# Patient Record
Sex: Male | Born: 1935 | Race: White | Hispanic: No | Marital: Married | Smoking: Former smoker
Health system: Southern US, Community
[De-identification: ages and names within clinical notes are randomized; demographics above are authoritative.]

## PROBLEM LIST (undated history)

## (undated) DIAGNOSIS — IMO0001 Reserved for inherently not codable concepts without codable children: Secondary | ICD-10-CM

## (undated) DIAGNOSIS — K222 Esophageal obstruction: Secondary | ICD-10-CM

## (undated) DIAGNOSIS — I714 Abdominal aortic aneurysm, without rupture, unspecified: Secondary | ICD-10-CM

## (undated) DIAGNOSIS — M5134 Other intervertebral disc degeneration, thoracic region: Secondary | ICD-10-CM

## (undated) DIAGNOSIS — R011 Cardiac murmur, unspecified: Secondary | ICD-10-CM

## (undated) DIAGNOSIS — Z950 Presence of cardiac pacemaker: Secondary | ICD-10-CM

## (undated) DIAGNOSIS — N400 Enlarged prostate without lower urinary tract symptoms: Secondary | ICD-10-CM

## (undated) DIAGNOSIS — I2510 Atherosclerotic heart disease of native coronary artery without angina pectoris: Secondary | ICD-10-CM

## (undated) DIAGNOSIS — E785 Hyperlipidemia, unspecified: Secondary | ICD-10-CM

## (undated) DIAGNOSIS — C801 Malignant (primary) neoplasm, unspecified: Secondary | ICD-10-CM

## (undated) DIAGNOSIS — I499 Cardiac arrhythmia, unspecified: Secondary | ICD-10-CM

## (undated) DIAGNOSIS — K219 Gastro-esophageal reflux disease without esophagitis: Secondary | ICD-10-CM

## (undated) DIAGNOSIS — I509 Heart failure, unspecified: Secondary | ICD-10-CM

## (undated) DIAGNOSIS — E119 Type 2 diabetes mellitus without complications: Secondary | ICD-10-CM

## (undated) DIAGNOSIS — I10 Essential (primary) hypertension: Secondary | ICD-10-CM

## (undated) DIAGNOSIS — I219 Acute myocardial infarction, unspecified: Secondary | ICD-10-CM

## (undated) DIAGNOSIS — E039 Hypothyroidism, unspecified: Secondary | ICD-10-CM

## (undated) DIAGNOSIS — M1990 Unspecified osteoarthritis, unspecified site: Secondary | ICD-10-CM

## (undated) HISTORY — PX: APPENDECTOMY: SHX54

## (undated) HISTORY — DX: Other intervertebral disc degeneration, thoracic region: M51.34

## (undated) HISTORY — DX: Heart failure, unspecified: I50.9

## (undated) HISTORY — PX: GREAT TOE ARTHRODESIS, INTERPHALANGEAL JOINT: SUR55

## (undated) HISTORY — DX: Abdominal aortic aneurysm, without rupture, unspecified: I71.40

## (undated) HISTORY — DX: Hyperlipidemia, unspecified: E78.5

## (undated) HISTORY — PX: HERNIA REPAIR: SHX51

## (undated) HISTORY — DX: Benign prostatic hyperplasia without lower urinary tract symptoms: N40.0

## (undated) HISTORY — PX: CARDIAC VALVE REPLACEMENT: SHX585

## (undated) HISTORY — DX: Abdominal aortic aneurysm, without rupture: I71.4

## (undated) HISTORY — PX: CARDIAC CATHETERIZATION: SHX172

## (undated) HISTORY — DX: Cardiac arrhythmia, unspecified: I49.9

## (undated) HISTORY — PX: SKIN BIOPSY: SHX1

## (undated) HISTORY — PX: EYE SURGERY: SHX253

## (undated) HISTORY — DX: Esophageal obstruction: K22.2

---

## 1993-02-13 HISTORY — PX: AORTIC VALVE REPLACEMENT: SHX41

## 1993-02-13 HISTORY — PX: CORONARY ARTERY BYPASS GRAFT: SHX141

## 2004-06-03 ENCOUNTER — Ambulatory Visit: Payer: Self-pay | Admitting: Internal Medicine

## 2004-12-06 ENCOUNTER — Ambulatory Visit: Payer: Self-pay | Admitting: Internal Medicine

## 2005-07-10 ENCOUNTER — Ambulatory Visit: Payer: Self-pay | Admitting: Internal Medicine

## 2005-12-19 ENCOUNTER — Ambulatory Visit: Payer: Self-pay | Admitting: Internal Medicine

## 2006-07-09 ENCOUNTER — Ambulatory Visit: Payer: Self-pay | Admitting: Internal Medicine

## 2007-03-06 ENCOUNTER — Ambulatory Visit: Payer: Self-pay | Admitting: Gastroenterology

## 2007-04-04 ENCOUNTER — Ambulatory Visit: Payer: Self-pay | Admitting: Gastroenterology

## 2007-07-24 ENCOUNTER — Ambulatory Visit: Payer: Self-pay | Admitting: Internal Medicine

## 2008-04-02 ENCOUNTER — Ambulatory Visit: Payer: Self-pay | Admitting: Internal Medicine

## 2008-07-13 ENCOUNTER — Ambulatory Visit: Payer: Self-pay | Admitting: Internal Medicine

## 2008-07-31 ENCOUNTER — Ambulatory Visit: Payer: Self-pay | Admitting: Internal Medicine

## 2009-03-04 ENCOUNTER — Ambulatory Visit: Payer: Self-pay | Admitting: Cardiology

## 2010-08-02 ENCOUNTER — Ambulatory Visit: Payer: Self-pay | Admitting: Gastroenterology

## 2010-08-23 ENCOUNTER — Ambulatory Visit: Payer: Self-pay | Admitting: Internal Medicine

## 2010-10-11 ENCOUNTER — Ambulatory Visit: Payer: Self-pay

## 2011-08-23 ENCOUNTER — Ambulatory Visit: Payer: Self-pay | Admitting: Internal Medicine

## 2012-02-14 HISTORY — PX: ESOPHAGOGASTRODUODENOSCOPY: SHX1529

## 2012-09-10 ENCOUNTER — Ambulatory Visit: Payer: Self-pay | Admitting: Internal Medicine

## 2012-11-07 ENCOUNTER — Ambulatory Visit: Payer: Self-pay | Admitting: Gastroenterology

## 2012-11-08 LAB — PATHOLOGY REPORT

## 2013-02-13 HISTORY — PX: COLONOSCOPY: SHX174

## 2013-09-25 ENCOUNTER — Ambulatory Visit: Payer: Self-pay | Admitting: Internal Medicine

## 2013-09-30 DIAGNOSIS — I219 Acute myocardial infarction, unspecified: Secondary | ICD-10-CM | POA: Insufficient documentation

## 2013-11-26 ENCOUNTER — Ambulatory Visit: Payer: Self-pay | Admitting: Gastroenterology

## 2013-11-26 LAB — CLOSTRIDIUM DIFFICILE(ARMC)

## 2013-11-28 LAB — STOOL CULTURE

## 2013-12-04 ENCOUNTER — Ambulatory Visit: Payer: Self-pay | Admitting: Cardiology

## 2013-12-04 LAB — BASIC METABOLIC PANEL
ANION GAP: 10 (ref 7–16)
BUN: 23 mg/dL — ABNORMAL HIGH (ref 7–18)
Calcium, Total: 8.4 mg/dL — ABNORMAL LOW (ref 8.5–10.1)
Chloride: 106 mmol/L (ref 98–107)
Co2: 26 mmol/L (ref 21–32)
Creatinine: 1.22 mg/dL (ref 0.60–1.30)
EGFR (African American): >60
GLUCOSE: 91 mg/dL (ref 65–99)
Osmolality: 286 (ref 275–301)
Potassium: 4 mmol/L (ref 3.5–5.1)
Sodium: 142 mmol/L (ref 136–145)

## 2013-12-04 LAB — PROTIME-INR
INR: 1.1
PROTHROMBIN TIME: 14 s (ref 11.5–14.7)

## 2013-12-17 LAB — CULTURE, FUNGUS WITHOUT SMEAR

## 2013-12-18 ENCOUNTER — Ambulatory Visit: Payer: Self-pay | Admitting: Gastroenterology

## 2014-02-24 DIAGNOSIS — I350 Nonrheumatic aortic (valve) stenosis: Secondary | ICD-10-CM | POA: Diagnosis not present

## 2014-02-24 DIAGNOSIS — Z951 Presence of aortocoronary bypass graft: Secondary | ICD-10-CM | POA: Diagnosis not present

## 2014-03-03 DIAGNOSIS — I4891 Unspecified atrial fibrillation: Secondary | ICD-10-CM | POA: Diagnosis not present

## 2014-03-25 DIAGNOSIS — I4891 Unspecified atrial fibrillation: Secondary | ICD-10-CM | POA: Diagnosis not present

## 2014-03-25 DIAGNOSIS — R0602 Shortness of breath: Secondary | ICD-10-CM | POA: Diagnosis not present

## 2014-03-25 DIAGNOSIS — E119 Type 2 diabetes mellitus without complications: Secondary | ICD-10-CM | POA: Diagnosis not present

## 2014-03-25 DIAGNOSIS — I2510 Atherosclerotic heart disease of native coronary artery without angina pectoris: Secondary | ICD-10-CM | POA: Diagnosis not present

## 2014-03-25 DIAGNOSIS — Z87891 Personal history of nicotine dependence: Secondary | ICD-10-CM | POA: Diagnosis not present

## 2014-03-25 DIAGNOSIS — Z951 Presence of aortocoronary bypass graft: Secondary | ICD-10-CM | POA: Diagnosis not present

## 2014-03-25 DIAGNOSIS — Z7982 Long term (current) use of aspirin: Secondary | ICD-10-CM | POA: Diagnosis not present

## 2014-03-25 DIAGNOSIS — I447 Left bundle-branch block, unspecified: Secondary | ICD-10-CM | POA: Diagnosis not present

## 2014-03-25 DIAGNOSIS — I350 Nonrheumatic aortic (valve) stenosis: Secondary | ICD-10-CM | POA: Diagnosis not present

## 2014-03-25 DIAGNOSIS — Z952 Presence of prosthetic heart valve: Secondary | ICD-10-CM | POA: Diagnosis not present

## 2014-03-25 DIAGNOSIS — Z006 Encounter for examination for normal comparison and control in clinical research program: Secondary | ICD-10-CM | POA: Diagnosis not present

## 2014-03-25 DIAGNOSIS — I454 Nonspecific intraventricular block: Secondary | ICD-10-CM | POA: Diagnosis not present

## 2014-03-25 DIAGNOSIS — Z7901 Long term (current) use of anticoagulants: Secondary | ICD-10-CM | POA: Diagnosis not present

## 2014-03-25 DIAGNOSIS — I9789 Other postprocedural complications and disorders of the circulatory system, not elsewhere classified: Secondary | ICD-10-CM | POA: Diagnosis not present

## 2014-03-25 DIAGNOSIS — R791 Abnormal coagulation profile: Secondary | ICD-10-CM | POA: Diagnosis not present

## 2014-03-25 DIAGNOSIS — Z794 Long term (current) use of insulin: Secondary | ICD-10-CM | POA: Diagnosis not present

## 2014-03-25 DIAGNOSIS — Z48812 Encounter for surgical aftercare following surgery on the circulatory system: Secondary | ICD-10-CM | POA: Diagnosis not present

## 2014-04-04 DIAGNOSIS — I447 Left bundle-branch block, unspecified: Secondary | ICD-10-CM | POA: Diagnosis not present

## 2014-04-09 DIAGNOSIS — E119 Type 2 diabetes mellitus without complications: Secondary | ICD-10-CM | POA: Diagnosis not present

## 2014-04-09 DIAGNOSIS — I2510 Atherosclerotic heart disease of native coronary artery without angina pectoris: Secondary | ICD-10-CM | POA: Diagnosis not present

## 2014-04-09 DIAGNOSIS — Z4682 Encounter for fitting and adjustment of non-vascular catheter: Secondary | ICD-10-CM | POA: Diagnosis not present

## 2014-04-09 DIAGNOSIS — J984 Other disorders of lung: Secondary | ICD-10-CM | POA: Diagnosis not present

## 2014-04-09 DIAGNOSIS — E780 Pure hypercholesterolemia: Secondary | ICD-10-CM | POA: Diagnosis not present

## 2014-04-09 DIAGNOSIS — I482 Chronic atrial fibrillation: Secondary | ICD-10-CM | POA: Diagnosis not present

## 2014-04-09 DIAGNOSIS — E039 Hypothyroidism, unspecified: Secondary | ICD-10-CM | POA: Diagnosis not present

## 2014-04-14 DIAGNOSIS — I482 Chronic atrial fibrillation: Secondary | ICD-10-CM | POA: Diagnosis not present

## 2014-04-14 DIAGNOSIS — E039 Hypothyroidism, unspecified: Secondary | ICD-10-CM | POA: Diagnosis not present

## 2014-04-14 DIAGNOSIS — I350 Nonrheumatic aortic (valve) stenosis: Secondary | ICD-10-CM | POA: Diagnosis not present

## 2014-04-14 DIAGNOSIS — E119 Type 2 diabetes mellitus without complications: Secondary | ICD-10-CM | POA: Diagnosis not present

## 2014-04-14 DIAGNOSIS — Z953 Presence of xenogenic heart valve: Secondary | ICD-10-CM | POA: Insufficient documentation

## 2014-04-14 DIAGNOSIS — E780 Pure hypercholesterolemia: Secondary | ICD-10-CM | POA: Diagnosis not present

## 2014-04-14 DIAGNOSIS — I10 Essential (primary) hypertension: Secondary | ICD-10-CM | POA: Diagnosis not present

## 2014-04-14 DIAGNOSIS — I2119 ST elevation (STEMI) myocardial infarction involving other coronary artery of inferior wall: Secondary | ICD-10-CM | POA: Diagnosis not present

## 2014-04-14 DIAGNOSIS — N400 Enlarged prostate without lower urinary tract symptoms: Secondary | ICD-10-CM | POA: Diagnosis not present

## 2014-04-14 DIAGNOSIS — Z79899 Other long term (current) drug therapy: Secondary | ICD-10-CM | POA: Diagnosis not present

## 2014-04-14 DIAGNOSIS — I4891 Unspecified atrial fibrillation: Secondary | ICD-10-CM | POA: Diagnosis not present

## 2014-04-28 ENCOUNTER — Emergency Department: Payer: Self-pay | Admitting: Emergency Medicine

## 2014-04-28 DIAGNOSIS — Z87891 Personal history of nicotine dependence: Secondary | ICD-10-CM | POA: Diagnosis not present

## 2014-04-28 DIAGNOSIS — R011 Cardiac murmur, unspecified: Secondary | ICD-10-CM | POA: Diagnosis not present

## 2014-04-28 DIAGNOSIS — Z79899 Other long term (current) drug therapy: Secondary | ICD-10-CM | POA: Diagnosis not present

## 2014-04-28 DIAGNOSIS — Z7901 Long term (current) use of anticoagulants: Secondary | ICD-10-CM | POA: Diagnosis not present

## 2014-04-28 DIAGNOSIS — E119 Type 2 diabetes mellitus without complications: Secondary | ICD-10-CM | POA: Diagnosis not present

## 2014-04-28 DIAGNOSIS — Z7902 Long term (current) use of antithrombotics/antiplatelets: Secondary | ICD-10-CM | POA: Diagnosis not present

## 2014-04-28 DIAGNOSIS — Z954 Presence of other heart-valve replacement: Secondary | ICD-10-CM | POA: Diagnosis not present

## 2014-04-28 DIAGNOSIS — R0602 Shortness of breath: Secondary | ICD-10-CM | POA: Diagnosis not present

## 2014-04-28 DIAGNOSIS — I10 Essential (primary) hypertension: Secondary | ICD-10-CM | POA: Diagnosis not present

## 2014-04-29 DIAGNOSIS — I10 Essential (primary) hypertension: Secondary | ICD-10-CM | POA: Diagnosis not present

## 2014-04-29 DIAGNOSIS — I350 Nonrheumatic aortic (valve) stenosis: Secondary | ICD-10-CM | POA: Diagnosis not present

## 2014-04-29 DIAGNOSIS — I482 Chronic atrial fibrillation: Secondary | ICD-10-CM | POA: Diagnosis not present

## 2014-04-29 DIAGNOSIS — I714 Abdominal aortic aneurysm, without rupture: Secondary | ICD-10-CM | POA: Diagnosis not present

## 2014-04-30 DIAGNOSIS — Z0100 Encounter for examination of eyes and vision without abnormal findings: Secondary | ICD-10-CM | POA: Diagnosis not present

## 2014-04-30 DIAGNOSIS — H5210 Myopia, unspecified eye: Secondary | ICD-10-CM | POA: Diagnosis not present

## 2014-04-30 DIAGNOSIS — H524 Presbyopia: Secondary | ICD-10-CM | POA: Diagnosis not present

## 2014-05-05 DIAGNOSIS — I481 Persistent atrial fibrillation: Secondary | ICD-10-CM | POA: Diagnosis not present

## 2014-05-07 DIAGNOSIS — Z954 Presence of other heart-valve replacement: Secondary | ICD-10-CM | POA: Diagnosis not present

## 2014-05-07 DIAGNOSIS — I517 Cardiomegaly: Secondary | ICD-10-CM | POA: Diagnosis not present

## 2014-05-07 DIAGNOSIS — Z48812 Encounter for surgical aftercare following surgery on the circulatory system: Secondary | ICD-10-CM | POA: Diagnosis not present

## 2014-05-07 DIAGNOSIS — I482 Chronic atrial fibrillation: Secondary | ICD-10-CM | POA: Diagnosis not present

## 2014-05-07 DIAGNOSIS — I361 Nonrheumatic tricuspid (valve) insufficiency: Secondary | ICD-10-CM | POA: Diagnosis not present

## 2014-05-07 DIAGNOSIS — I447 Left bundle-branch block, unspecified: Secondary | ICD-10-CM | POA: Diagnosis not present

## 2014-05-07 DIAGNOSIS — R001 Bradycardia, unspecified: Secondary | ICD-10-CM | POA: Diagnosis not present

## 2014-05-07 DIAGNOSIS — R5383 Other fatigue: Secondary | ICD-10-CM | POA: Diagnosis not present

## 2014-05-07 DIAGNOSIS — Z952 Presence of prosthetic heart valve: Secondary | ICD-10-CM | POA: Diagnosis not present

## 2014-05-07 DIAGNOSIS — I4891 Unspecified atrial fibrillation: Secondary | ICD-10-CM | POA: Diagnosis not present

## 2014-05-07 DIAGNOSIS — I340 Nonrheumatic mitral (valve) insufficiency: Secondary | ICD-10-CM | POA: Diagnosis not present

## 2014-05-07 DIAGNOSIS — I351 Nonrheumatic aortic (valve) insufficiency: Secondary | ICD-10-CM | POA: Diagnosis not present

## 2014-05-07 DIAGNOSIS — Z7901 Long term (current) use of anticoagulants: Secondary | ICD-10-CM | POA: Diagnosis not present

## 2014-05-12 DIAGNOSIS — I4891 Unspecified atrial fibrillation: Secondary | ICD-10-CM | POA: Diagnosis not present

## 2014-05-13 DIAGNOSIS — I350 Nonrheumatic aortic (valve) stenosis: Secondary | ICD-10-CM | POA: Diagnosis not present

## 2014-05-13 DIAGNOSIS — I714 Abdominal aortic aneurysm, without rupture: Secondary | ICD-10-CM | POA: Diagnosis not present

## 2014-05-13 DIAGNOSIS — I482 Chronic atrial fibrillation: Secondary | ICD-10-CM | POA: Diagnosis not present

## 2014-05-13 DIAGNOSIS — I10 Essential (primary) hypertension: Secondary | ICD-10-CM | POA: Diagnosis not present

## 2014-05-19 ENCOUNTER — Emergency Department: Admit: 2014-05-19 | Disposition: A | Discharge status: Home / Self Care | Payer: Self-pay | Admitting: Internal Medicine

## 2014-05-19 DIAGNOSIS — R300 Dysuria: Secondary | ICD-10-CM | POA: Diagnosis not present

## 2014-05-19 DIAGNOSIS — N3001 Acute cystitis with hematuria: Secondary | ICD-10-CM | POA: Diagnosis not present

## 2014-05-19 DIAGNOSIS — Z720 Tobacco use: Secondary | ICD-10-CM | POA: Diagnosis not present

## 2014-05-19 DIAGNOSIS — N23 Unspecified renal colic: Secondary | ICD-10-CM | POA: Diagnosis not present

## 2014-05-19 DIAGNOSIS — N281 Cyst of kidney, acquired: Secondary | ICD-10-CM | POA: Diagnosis not present

## 2014-05-19 DIAGNOSIS — N200 Calculus of kidney: Secondary | ICD-10-CM | POA: Diagnosis not present

## 2014-05-19 DIAGNOSIS — N400 Enlarged prostate without lower urinary tract symptoms: Secondary | ICD-10-CM | POA: Diagnosis not present

## 2014-05-19 DIAGNOSIS — N3090 Cystitis, unspecified without hematuria: Secondary | ICD-10-CM | POA: Diagnosis not present

## 2014-05-19 DIAGNOSIS — N201 Calculus of ureter: Secondary | ICD-10-CM | POA: Diagnosis not present

## 2014-05-19 LAB — BASIC METABOLIC PANEL
Anion Gap: 7 (ref 7–16)
BUN: 34 mg/dL — AB
CALCIUM: 8.9 mg/dL
CREATININE: 1.32 mg/dL — AB
Chloride: 100 mmol/L — ABNORMAL LOW
Co2: 29 mmol/L
EGFR (African American): 60 — ABNORMAL LOW
EGFR (Non-African Amer.): 52 — ABNORMAL LOW
Glucose: 136 mg/dL — ABNORMAL HIGH
POTASSIUM: 4.6 mmol/L
SODIUM: 136 mmol/L

## 2014-05-19 LAB — URINALYSIS, COMPLETE
Bacteria: NONE SEEN
Bilirubin,UR: NEGATIVE
Glucose,UR: NEGATIVE mg/dL (ref 0–75)
Ketone: NEGATIVE
Leukocyte Esterase: NEGATIVE
NITRITE: NEGATIVE
PROTEIN: NEGATIVE
Ph: 5 (ref 4.5–8.0)
SPECIFIC GRAVITY: 1.005 (ref 1.003–1.030)
Squamous Epithelial: NONE SEEN
WBC UR: 1 /HPF (ref 0–5)

## 2014-05-19 LAB — CBC WITH DIFFERENTIAL/PLATELET
BASOS ABS: 0 10*3/uL (ref 0.0–0.1)
Basophil %: 0.5 %
Eosinophil #: 0.2 10*3/uL (ref 0.0–0.7)
Eosinophil %: 3.2 %
HCT: 37.3 % — ABNORMAL LOW (ref 40.0–52.0)
HGB: 12.5 g/dL — AB (ref 13.0–18.0)
LYMPHS PCT: 21.2 %
Lymphocyte #: 1.6 10*3/uL (ref 1.0–3.6)
MCH: 32.1 pg (ref 26.0–34.0)
MCHC: 33.5 g/dL (ref 32.0–36.0)
MCV: 96 fL (ref 80–100)
MONO ABS: 0.6 x10 3/mm (ref 0.2–1.0)
Monocyte %: 8.4 %
NEUTROS PCT: 66.7 %
Neutrophil #: 4.9 10*3/uL (ref 1.4–6.5)
PLATELETS: 81 10*3/uL — AB (ref 150–440)
RBC: 3.89 10*6/uL — ABNORMAL LOW (ref 4.40–5.90)
RDW: 13.4 % (ref 11.5–14.5)
WBC: 7.3 10*3/uL (ref 3.8–10.6)

## 2014-05-19 LAB — PROTIME-INR
INR: 2.5
Prothrombin Time: 26.7 secs — ABNORMAL HIGH

## 2014-05-21 LAB — URINE CULTURE

## 2014-05-22 DIAGNOSIS — I482 Chronic atrial fibrillation: Secondary | ICD-10-CM | POA: Diagnosis not present

## 2014-05-27 DIAGNOSIS — I482 Chronic atrial fibrillation: Secondary | ICD-10-CM | POA: Diagnosis not present

## 2014-05-27 DIAGNOSIS — Z79899 Other long term (current) drug therapy: Secondary | ICD-10-CM | POA: Diagnosis not present

## 2014-05-27 DIAGNOSIS — E119 Type 2 diabetes mellitus without complications: Secondary | ICD-10-CM | POA: Diagnosis not present

## 2014-05-27 DIAGNOSIS — N200 Calculus of kidney: Secondary | ICD-10-CM | POA: Diagnosis not present

## 2014-06-03 DIAGNOSIS — I4891 Unspecified atrial fibrillation: Secondary | ICD-10-CM | POA: Diagnosis not present

## 2014-06-03 DIAGNOSIS — I10 Essential (primary) hypertension: Secondary | ICD-10-CM | POA: Diagnosis not present

## 2014-06-03 DIAGNOSIS — I482 Chronic atrial fibrillation: Secondary | ICD-10-CM | POA: Diagnosis not present

## 2014-06-03 DIAGNOSIS — I350 Nonrheumatic aortic (valve) stenosis: Secondary | ICD-10-CM | POA: Diagnosis not present

## 2014-06-08 LAB — SURGICAL PATHOLOGY

## 2014-06-09 DIAGNOSIS — I4891 Unspecified atrial fibrillation: Secondary | ICD-10-CM | POA: Diagnosis not present

## 2014-06-11 DIAGNOSIS — K649 Unspecified hemorrhoids: Secondary | ICD-10-CM | POA: Diagnosis not present

## 2014-06-11 DIAGNOSIS — Z79899 Other long term (current) drug therapy: Secondary | ICD-10-CM | POA: Diagnosis not present

## 2014-06-11 DIAGNOSIS — I10 Essential (primary) hypertension: Secondary | ICD-10-CM | POA: Diagnosis not present

## 2014-06-11 DIAGNOSIS — M5136 Other intervertebral disc degeneration, lumbar region: Secondary | ICD-10-CM | POA: Diagnosis not present

## 2014-06-17 DIAGNOSIS — I4891 Unspecified atrial fibrillation: Secondary | ICD-10-CM

## 2014-06-17 NOTE — H&P (Signed)
Progress Notes   RANDELL TEARE (MR# [id number])        Progress Notes Info     Author Note Status Last Update User Last Update Date/Time   [name], MD Signed [name], MD Wed Jun 03, 2014 9:53 AM    Progress Notes    Expand All Collapse All   Established Patient Visit   Chief Complaint: Chief Complaint  Patient presents with  . Follow-up    Discuss a pacemaker    Date of Service: 06/03/2014 Date of Birth: September 21, 1935 PCP: [name], MD, MD  History of Present Illness: Mr. Mcmillen is a 79 y.o.male patient who returns for coronary artery disease, persistent atrial fibrillation, aortic stenosis status post TAVR, hypertension and hyperlipidemia. The patient is status post CABG x4 11/10/1993. Cardiac catheterization 12/04/2013 revealed occluded LAD, occluded left circumflex, 80% stenosis RCA, patent LIMA to LAD, patent SVG to D 1, patent SVG to OM1 and 60% stenosis SVG to PDA. The patient denies chest pain. He has mild exertional dyspnea. He denies palpitations or heart racing. He denies peripheral edema. The patient has not experienced presyncope or syncope. The patient is not active and does not exercise regularly.  The patient underwent TAVR on 04/01/2014 at [facility]. The patient started cardiac rehabilitation but experienced progressive generalized weakness and shortness of breath. He went to the emergency room on 04/14/2014 at which time he was noted to be in atrial fibrillation with a controlled ventricular rate at 68 beats per minute. 24 hour Holter monitor revealed persistent atrial fibrillation with a mean heart rate of 65 beats per minute. The patient has a chads Vasc score 5 and currently is on chronic warfarin therapy. The patient was seen in follow-up at [facility] status post TAVR. Echocardiogram was performed on 05/07/2014 which revealed normal left ventricular function, LV ejection fraction of  55-60% with stable appearing aortic valve prosthesis with a mean gradient of 14.5 mm Hg. Thirty day loop monitor was performed however results are unavailable at time of this dictation. The patient was told that he would require a permanent pacemaker based on loop monitor results. EKG today reveals atrial fibrillation at a rate of 81 beats per minute.  Patient has essential hypertension, blood pressure today under excellent control on current BP medications including enalapril and metoprolol, which are tolerated well without apparent side effects.  The patient has hyperlipidemia, currently on simvastatin, which is tolerated well without apparent side-effects.  The patient has type 2 diabetes, currently on oral agents, including glipizide and metformin, which is tolerated well, followed by his primary care provider.  Past Medical and Surgical History  Past Medical History Past Medical History  Diagnosis Date  . Inferior myocardial infarction 1994  . Diabetes mellitus type 2, uncomplicated   . Hypothyroidism   . Hypertension   . BPH (benign prostatic hypertrophy)   . Former smoker   . Hyperlipidemia   . Abdominal aneurysm   . Rash     - annularis  . GERD (gastroesophageal reflux disease)   . DDD (degenerative disc disease)     w/ back pain  . Esophageal stricture   . Lone atrial fibrillation 7/98  . Chronic atrial fibrillation since 5/09   . Aortic stenosis, mild   . Rotator cuff syndrome   . Hx of CABG 12/25/2013    1. CABG x 4 performed 11/10/1993 with Dr. Rosana Hoes here at [facility]     Past Surgical History He has past surgical history  that includes Quadruple coronary artery bypass (11/10/1993); Hernia repair (Left); Appendectomy; Foot surgery for nerve impingement (Left); PTCA of RCA (5/94 and 9/94); Coronary artery bypass graft; Cardiac catheterization; colonoscopy (08/02/2010); egd (11/07/2012); and Colonoscopy  (12/18/2013).   Medications and Allergies  Current Medications   Current Medications    Current Outpatient Prescriptions  Medication Sig Dispense Refill  . ciprofloxacin HCl (CIPRO) 250 MG tablet     . clopidogrel (PLAVIX) 75 mg tablet Take 75 mg by mouth once daily.    . enalapril (VASOTEC) 5 MG tablet Take 1 tablet (5 mg total) by mouth 2 (two) times daily. 180 tablet 3  . FUROsemide (LASIX) 20 MG tablet Take 1 tablet (20 mg total) by mouth once daily. 30 tablet 11  . gabapentin (NEURONTIN) 300 MG capsule Take 1 capsule (300 mg total) by mouth nightly. 90 capsule 3  . glipiZIDE (GLUCOTROL XL) 5 MG XL tablet Take 1 tablet (5 mg total) by mouth once daily. 90 tablet 3  . HYDROcodone-acetaminophen (NORCO) 5-325 mg tablet     . levothyroxine (SYNTHROID, LEVOTHROID) 137 MCG tablet Take 137 mcg by mouth once daily. 90 tablet 3  . metFORMIN (GLUCOPHAGE) 500 MG tablet Take 1 tablet (500 mg total) by mouth 2 (two) times daily with meals. 180 tablet 3  . metoprolol succinate (TOPROL-XL) 25 MG XL tablet Take 1 tablet (25 mg total) by mouth once daily. 90 tablet 3  . omeprazole (PRILOSEC) 20 MG DR capsule Take 1 capsule (20 mg total) by mouth once daily. 90 capsule 3  . simvastatin (ZOCOR) 40 MG tablet Take 1 tablet (40 mg total) by mouth nightly. 90 tablet 3  . tamsulosin (FLOMAX) 0.4 mg capsule Take 1 capsule (0.4 mg total) by mouth once daily. Take 30 minutes after same meal each day. 90 capsule 3  . temazepam (RESTORIL) 15 mg capsule TAKE ONE CAPSULE BY MOUTH AT BEDTIME FOR SLEEP 30 capsule 4  . warfarin (COUMADIN) 1 MG tablet TAKE 1 AND 1/2 TABLETS BY MOUTH DAILY, TAKE WITH 3 MG FOR TOTAL DAILY DOSE OF 4.5 MG. 135 tablet 2  . warfarin (COUMADIN) 3 MG tablet Take 1 tablet (3 mg total) by mouth once daily. 90 tablet 3   No current facility-administered medications for this visit.      Allergies: Sulfa  (sulfonamide antibiotics) and Sulfamethoxazole  Social and Family History  Social History  reports that he quit smoking about 28 years ago. He has never used smokeless tobacco. He reports that he does not drink alcohol or use illicit drugs.  Family History Family History  Problem Relation Age of Onset  . Lung cancer Father   . Heart attack Father   . Coronary artery disease Sister 36    s/p angioplasty  . Colon cancer Neg Hx   . Colon polyps Neg Hx   . Liver disease Neg Hx   . Rectal cancer Neg Hx   . Ulcers Neg Hx   . Heart attack Maternal Grandfather     Review of Systems   Review of Systems: The patient denies chest pain, reports chronic exertional shortness of breath, without orthopnea, paroxysmal nocturnal dyspnea, pedal edema, palpitations, heart racing, presyncope, syncope. Review of 12 Systems is negative except as described above.  Physical Examination   Vitals:BP 128/72 mmHg  Pulse 60  Ht 182.9 cm (6')  Wt 84.097 kg (185 lb 6.4 oz)  BMI 25.14 kg/m2 Ht:182.9 cm (6') Wt:84.097 kg (185 lb 6.4 oz) GGY:IRSW surface area is 2.07 meters  squared. Body mass index is 25.14 kg/(m^2).  HEENT: Pupils equally reactive to light and accomodation Neck: Supple without thyromegaly, carotid pulses 2+ Lungs: clear to auscultation bilaterally; no wheezes, rales, rhonchi Heart: Irregular irregular rhythm. No gallops, murmurs or rub Abdomen: soft nontender, nondistended, with normal bowel sounds Extremities: no cyanosis, clubbing, or edema Peripheral Pulses: 2+ in all extremities, 2+ femoral pulses bilaterally  Assessment   79 y.o. male with  1. Chronic atrial fibrillation   2. Aortic stenosis, mild   3. Aortic stenosis, severe   4. Essential hypertension with goal blood pressure less than 140/90   5. Lone atrial fibrillation   6. Non-ST elevation (NSTEMI) myocardial infarction   7. Diabetes mellitus type  2, uncomplicated   8. Ischemic chest pain   9. H/O cardiac catheterization   10. Hx of CABG   11. Pure hypercholesterolemia   12. S/p TAVR (transcatheter aortic valve replacement), bioprosthetic   13. Bradycardia    79 year old gentleman with known CAD, status post CABG, currently without chest pain. The patient is status post TAVR with recent echocardiogram revealing normal left ventricular function and stable appearing aortic valve prosthesis. The patient has persistent atrial fibrillation, chads Vasc score 5, currently on warfarin, experiencing exertional dyspnea and generalized fatigue. The patient had recent follow-up [facility] which time he was instructed that he may require permanent pacemaker based on loop monitor results. The patient has essential hypertension, blood pressure under good control on current medications. Patient has hyperlipidemia, currently on simvastatin which is well tolerated. He has type 2 diabetes, currently on oral agents, followed by his primary care provider.    Plan   1. Continue current medications 2. Continue warfarin for stroke prevention 3. Obtain a loop monitor results 4. Counseled patient about low-sodium diet 5. DASH diet printed instructions given to the patient  6. Counseled patient about low-cholesterol diet 7. Continue simvastatin for hyperlipidemia management 8. Low-fat and cholesterol diet printed instructions given to the patient 9. Diabetes diet printed instructions given to the patient 10. Proceed with elective cardioversion. If successful then will start sotalol 80 mg b.i.d. for maintenance of sinus rhythm. 11. Return to clinic after elective cardioversion  Orders Placed This Encounter  Procedures  . ECG 12-lead    Return in about 3 weeks (around 06/24/2014).  [name], [facility]   [address] [place] [address]     Department    Name Address Phone Fax   [facility] [place] [address] [phone]     Progress Notes   VOLLIE BRUNTY (MR# [id number])        Progress Notes Info     Author Note Status Last Update User Last Update Date/Time   [name], MD Signed [name], MD Wed Jun 03, 2014 9:53 AM    Progress Notes    Expand All Collapse All   Established Patient Visit   Chief Complaint: Chief Complaint  Patient presents with  . Follow-up    Discuss a pacemaker    Date of Service: 06/03/2014 Date of Birth: [birth year] PCP: [name], MD, MD  History of Present Illness: Mr. Kutzer is a 79 y.o.male patient who returns for coronary artery disease, persistent atrial fibrillation, aortic stenosis status post TAVR, hypertension and hyperlipidemia. The patient is status post CABG x4 11/10/1993. Cardiac catheterization 12/04/2013 revealed occluded LAD, occluded left circumflex,  80% stenosis RCA, patent LIMA to LAD, patent SVG to D 1, patent SVG to OM1 and 60% stenosis SVG to PDA. The patient denies chest pain. He has mild exertional dyspnea. He denies palpitations or heart racing. He denies peripheral edema. The patient has not experienced presyncope or syncope. The patient is not active and does not exercise regularly.  The patient underwent TAVR on 04/01/2014 at [facility]. The patient started cardiac rehabilitation but experienced progressive generalized weakness and shortness of breath. He went to the emergency room on 04/14/2014 at which time he was noted to be in atrial fibrillation with a controlled ventricular rate at 68 beats per minute. 24 hour Holter monitor revealed persistent atrial fibrillation with a mean heart rate of 65 beats per minute. The patient has a chads Vasc score 5 and currently is on chronic warfarin therapy. The patient was seen in follow-up at [facility] status post TAVR. Echocardiogram was performed on 05/07/2014 which revealed normal left ventricular function, LV ejection fraction of 55-60% with stable appearing aortic valve prosthesis with a mean gradient of 14.5 mm Hg. Thirty day loop monitor was performed however results are unavailable at time of this dictation. The patient was told that he would require a permanent pacemaker based on loop monitor results. EKG today reveals atrial fibrillation at a rate of 81 beats per minute.  Patient has essential hypertension, blood pressure today under excellent control on current BP medications including enalapril and metoprolol, which are tolerated well without apparent side effects.  The patient has hyperlipidemia, currently on simvastatin, which is tolerated well without apparent side-effects.  The patient has type 2 diabetes, currently on oral agents, including glipizide and metformin, which is tolerated well, followed by his primary care provider.  Past Medical and Surgical History  Past Medical History Past Medical History  Diagnosis Date  . Inferior myocardial infarction 1994  . Diabetes mellitus type 2, uncomplicated   . Hypothyroidism   . Hypertension   . BPH (benign prostatic hypertrophy)   . Former smoker   . Hyperlipidemia   . Abdominal aneurysm   . Rash     - annularis  . GERD (gastroesophageal reflux disease)   . DDD (degenerative disc disease)     w/ back pain  . Esophageal stricture   . Lone atrial fibrillation 7/98  . Chronic atrial fibrillation since 5/09   . Aortic stenosis, mild   . Rotator cuff syndrome   . Hx of CABG 12/25/2013    1. CABG x 4 performed 11/10/1993 with Dr. Rosana Hoes here at [facility]     Past Surgical History He has past surgical history that includes Quadruple coronary artery bypass (11/10/1993); Hernia repair (Left); Appendectomy; Foot surgery for nerve impingement (Left); PTCA of  RCA (5/94 and 9/94); Coronary artery bypass graft; Cardiac catheterization; colonoscopy (08/02/2010); egd (11/07/2012); and Colonoscopy (12/18/2013).   Medications and Allergies  Current Medications   Current Medications    Current Outpatient Prescriptions  Medication Sig Dispense Refill  . ciprofloxacin HCl (CIPRO) 250 MG tablet     . clopidogrel (PLAVIX) 75 mg tablet Take 75 mg by mouth once daily.    . enalapril (VASOTEC) 5 MG tablet Take 1 tablet (5 mg total) by mouth 2 (two) times daily. 180 tablet 3  . FUROsemide (LASIX) 20 MG tablet Take 1 tablet (20 mg total) by mouth once daily. 30 tablet 11  . gabapentin (NEURONTIN) 300 MG capsule Take 1 capsule (300 mg total) by mouth nightly. Sorrel  capsule 3  . glipiZIDE (GLUCOTROL XL) 5 MG XL tablet Take 1 tablet (5 mg total) by mouth once daily. 90 tablet 3  . HYDROcodone-acetaminophen (NORCO) 5-325 mg tablet     . levothyroxine (SYNTHROID, LEVOTHROID) 137 MCG tablet Take 137 mcg by mouth once daily. 90 tablet 3  . metFORMIN (GLUCOPHAGE) 500 MG tablet Take 1 tablet (500 mg total) by mouth 2 (two) times daily with meals. 180 tablet 3  . metoprolol succinate (TOPROL-XL) 25 MG XL tablet Take 1 tablet (25 mg total) by mouth once daily. 90 tablet 3  . omeprazole (PRILOSEC) 20 MG DR capsule Take 1 capsule (20 mg total) by mouth once daily. 90 capsule 3  . simvastatin (ZOCOR) 40 MG tablet Take 1 tablet (40 mg total) by mouth nightly. 90 tablet 3  . tamsulosin (FLOMAX) 0.4 mg capsule Take 1 capsule (0.4 mg total) by mouth once daily. Take 30 minutes after same meal each day. 90 capsule 3  . temazepam (RESTORIL) 15 mg capsule TAKE ONE CAPSULE BY MOUTH AT BEDTIME FOR SLEEP 30 capsule 4  . warfarin (COUMADIN) 1 MG tablet TAKE 1 AND 1/2 TABLETS BY MOUTH DAILY, TAKE WITH 3 MG FOR TOTAL DAILY DOSE OF 4.5 MG. 135 tablet 2  . warfarin (COUMADIN) 3 MG tablet Take 1 tablet (3 mg  total) by mouth once daily. 90 tablet 3   No current facility-administered medications for this visit.      Allergies: Sulfa (sulfonamide antibiotics) and Sulfamethoxazole  Social and Family History  Social History  reports that he quit smoking about 28 years ago. He has never used smokeless tobacco. He reports that he does not drink alcohol or use illicit drugs.  Family History Family History  Problem Relation Age of Onset  . Lung cancer Father   . Heart attack Father   . Coronary artery disease Sister 76    s/p angioplasty  . Colon cancer Neg Hx   . Colon polyps Neg Hx   . Liver disease Neg Hx   . Rectal cancer Neg Hx   . Ulcers Neg Hx   . Heart attack Maternal Grandfather     Review of Systems   Review of Systems: The patient denies chest pain, reports chronic exertional shortness of breath, without orthopnea, paroxysmal nocturnal dyspnea, pedal edema, palpitations, heart racing, presyncope, syncope. Review of 12 Systems is negative except as described above.  Physical Examination   Vitals:BP 128/72 mmHg  Pulse 60  Ht 182.9 cm (6')  Wt 84.097 kg (185 lb 6.4 oz)  BMI 25.14 kg/m2 Ht:182.9 cm (6') Wt:84.097 kg (185 lb 6.4 oz) VQQ:VZDG surface area is 2.07 meters squared. Body mass index is 25.14 kg/(m^2).  HEENT: Pupils equally reactive to light and accomodation Neck: Supple without thyromegaly, carotid pulses 2+ Lungs: clear to auscultation bilaterally; no wheezes, rales, rhonchi Heart: Irregular irregular rhythm. No gallops, murmurs or rub Abdomen: soft nontender, nondistended, with normal bowel sounds Extremities: no cyanosis, clubbing, or edema Peripheral Pulses: 2+ in all extremities, 2+ femoral pulses bilaterally  Assessment   79 y.o. male with  1. Chronic atrial fibrillation   2. Aortic stenosis, mild   3. Aortic stenosis, severe   4. Essential hypertension with goal blood pressure  less than 140/90   5. Lone atrial fibrillation   6. Non-ST elevation (NSTEMI) myocardial infarction   7. Diabetes mellitus type 2, uncomplicated   8. Ischemic chest pain   9. H/O cardiac catheterization   10. Hx of CABG   11.  Pure hypercholesterolemia   12. S/p TAVR (transcatheter aortic valve replacement), bioprosthetic   13. Bradycardia    79 year old gentleman with known CAD, status post CABG, currently without chest pain. The patient is status post TAVR with recent echocardiogram revealing normal left ventricular function and stable appearing aortic valve prosthesis. The patient has persistent atrial fibrillation, chads Vasc score 5, currently on warfarin, experiencing exertional dyspnea and generalized fatigue. The patient had recent follow-up [facility] which time he was instructed that he may require permanent pacemaker based on loop monitor results. The patient has essential hypertension, blood pressure under good control on current medications. Patient has hyperlipidemia, currently on simvastatin which is well tolerated. He has type 2 diabetes, currently on oral agents, followed by his primary care provider.    Plan   1. Continue current medications 2. Continue warfarin for stroke prevention 3. Obtain a loop monitor results 4. Counseled patient about low-sodium diet 5. DASH diet printed instructions given to the patient  6. Counseled patient about low-cholesterol diet 7. Continue simvastatin for hyperlipidemia management 8. Low-fat and cholesterol diet printed instructions given to the patient 9. Diabetes diet printed instructions given to the patient 10. Proceed with elective cardioversion. If successful then will start sotalol 80 mg b.i.d. for maintenance of sinus rhythm. 11. Return to clinic after elective cardioversion  Orders Placed This Encounter  Procedures  . ECG 12-lead    Return in about 3 weeks (around  06/24/2014).  [name], [facility]   [address] [place] [address]    Department    Name Address Phone Fax   [facility] [place] [address] [phone]

## 2014-06-18 ENCOUNTER — Ambulatory Visit
Admission: RE | Admit: 2014-06-18 | Discharge: 2014-06-18 | Disposition: A | Discharge status: Home / Self Care | Payer: Commercial Managed Care - HMO | Source: Ambulatory Visit | Attending: Cardiology | Admitting: Cardiology

## 2014-06-18 ENCOUNTER — Encounter
Admission: RE | Disposition: A | Discharge status: Home / Self Care | Payer: Self-pay | Source: Ambulatory Visit | Attending: Cardiology

## 2014-06-18 ENCOUNTER — Ambulatory Visit: Payer: Commercial Managed Care - HMO | Admitting: Anesthesiology

## 2014-06-18 ENCOUNTER — Encounter: Payer: Self-pay | Admitting: *Deleted

## 2014-06-18 DIAGNOSIS — Z8249 Family history of ischemic heart disease and other diseases of the circulatory system: Secondary | ICD-10-CM | POA: Insufficient documentation

## 2014-06-18 DIAGNOSIS — I10 Essential (primary) hypertension: Secondary | ICD-10-CM | POA: Insufficient documentation

## 2014-06-18 DIAGNOSIS — Z801 Family history of malignant neoplasm of trachea, bronchus and lung: Secondary | ICD-10-CM | POA: Insufficient documentation

## 2014-06-18 DIAGNOSIS — Z7982 Long term (current) use of aspirin: Secondary | ICD-10-CM | POA: Insufficient documentation

## 2014-06-18 DIAGNOSIS — I350 Nonrheumatic aortic (valve) stenosis: Secondary | ICD-10-CM | POA: Diagnosis not present

## 2014-06-18 DIAGNOSIS — E118 Type 2 diabetes mellitus with unspecified complications: Secondary | ICD-10-CM | POA: Diagnosis not present

## 2014-06-18 DIAGNOSIS — Z7901 Long term (current) use of anticoagulants: Secondary | ICD-10-CM | POA: Insufficient documentation

## 2014-06-18 DIAGNOSIS — Z882 Allergy status to sulfonamides status: Secondary | ICD-10-CM | POA: Insufficient documentation

## 2014-06-18 DIAGNOSIS — I481 Persistent atrial fibrillation: Secondary | ICD-10-CM | POA: Diagnosis not present

## 2014-06-18 DIAGNOSIS — E785 Hyperlipidemia, unspecified: Secondary | ICD-10-CM | POA: Diagnosis not present

## 2014-06-18 DIAGNOSIS — Z951 Presence of aortocoronary bypass graft: Secondary | ICD-10-CM | POA: Diagnosis not present

## 2014-06-18 DIAGNOSIS — E039 Hypothyroidism, unspecified: Secondary | ICD-10-CM | POA: Insufficient documentation

## 2014-06-18 DIAGNOSIS — I4891 Unspecified atrial fibrillation: Secondary | ICD-10-CM | POA: Diagnosis not present

## 2014-06-18 DIAGNOSIS — I482 Chronic atrial fibrillation: Secondary | ICD-10-CM | POA: Diagnosis not present

## 2014-06-18 DIAGNOSIS — N400 Enlarged prostate without lower urinary tract symptoms: Secondary | ICD-10-CM | POA: Diagnosis not present

## 2014-06-18 DIAGNOSIS — I252 Old myocardial infarction: Secondary | ICD-10-CM | POA: Insufficient documentation

## 2014-06-18 DIAGNOSIS — Z87891 Personal history of nicotine dependence: Secondary | ICD-10-CM | POA: Diagnosis not present

## 2014-06-18 DIAGNOSIS — Z9889 Other specified postprocedural states: Secondary | ICD-10-CM | POA: Insufficient documentation

## 2014-06-18 DIAGNOSIS — Z79899 Other long term (current) drug therapy: Secondary | ICD-10-CM | POA: Insufficient documentation

## 2014-06-18 DIAGNOSIS — K219 Gastro-esophageal reflux disease without esophagitis: Secondary | ICD-10-CM | POA: Insufficient documentation

## 2014-06-18 DIAGNOSIS — I2510 Atherosclerotic heart disease of native coronary artery without angina pectoris: Secondary | ICD-10-CM | POA: Insufficient documentation

## 2014-06-18 DIAGNOSIS — Z7902 Long term (current) use of antithrombotics/antiplatelets: Secondary | ICD-10-CM | POA: Insufficient documentation

## 2014-06-18 HISTORY — DX: Unspecified osteoarthritis, unspecified site: M19.90

## 2014-06-18 HISTORY — DX: Gastro-esophageal reflux disease without esophagitis: K21.9

## 2014-06-18 HISTORY — DX: Acute myocardial infarction, unspecified: I21.9

## 2014-06-18 HISTORY — DX: Essential (primary) hypertension: I10

## 2014-06-18 HISTORY — PX: ELECTROPHYSIOLOGIC STUDY: SHX172A

## 2014-06-18 HISTORY — DX: Atherosclerotic heart disease of native coronary artery without angina pectoris: I25.10

## 2014-06-18 HISTORY — DX: Type 2 diabetes mellitus without complications: E11.9

## 2014-06-18 HISTORY — DX: Hypothyroidism, unspecified: E03.9

## 2014-06-18 LAB — PROTIME-INR
INR: 2.69
Prothrombin Time: 28.7 seconds — ABNORMAL HIGH (ref 11.4–15.0)

## 2014-06-18 LAB — GLUCOSE, CAPILLARY: Glucose-Capillary: 93 mg/dL (ref 70–99)

## 2014-06-18 SURGERY — ATRIAL FIBRILLATION ABLATION
Anesthesia: Moderate Sedation

## 2014-06-18 SURGERY — CARDIOVERSION (CATH LAB)
Anesthesia: Monitor Anesthesia Care

## 2014-06-18 MED ORDER — SODIUM CHLORIDE 0.9 % IV SOLN: INTRAVENOUS | Status: DC

## 2014-06-18 MED ORDER — SODIUM CHLORIDE 0.9 % IV SOLN
INTRAVENOUS | Status: DC | PRN
  Administered 2014-06-18: 07:00:00 via INTRAVENOUS

## 2014-06-18 NOTE — Anesthesia Postprocedure Evaluation (Signed)
  Anesthesia Post-op Note  Patient: Derrick Cochran  Procedure(s) Performed: Procedure(s): CARDIOVERSION (N/A)  Anesthesia type:MAC  Patient location: PACU  Post pain: Pain level controlled  Post assessment: Post-op Vital signs reviewed, Patient's Cardiovascular Status Stable, Respiratory Function Stable, Patent Airway and No signs of Nausea or vomiting  Post vital signs: Reviewed and stable  Last Vitals:  Filed Vitals:   06/18/14 0709  BP: 147/60  Temp: 36.8 C    Level of consciousness: awake, alert  and patient cooperative  Complications: No apparent anesthesia complications

## 2014-06-18 NOTE — Anesthesia Preprocedure Evaluation (Signed)
Anesthesia Evaluation  Patient identified by MRN, date of birth, ID band Patient awake    Reviewed: Allergy & Precautions, Patient's Chart, lab work & pertinent test results, reviewed documented beta blocker date and time   Airway Mallampati: II  TM Distance: >3 FB     Dental no notable dental hx.    Pulmonary former smoker,          Cardiovascular hypertension, + CAD and + Past MI     Neuro/Psych    GI/Hepatic   Endo/Other  diabetes, Type 2  Renal/GU      Musculoskeletal   Abdominal   Peds  Hematology   Anesthesia Other Findings   Reproductive/Obstetrics                             Anesthesia Physical Anesthesia Plan  ASA: III  Anesthesia Plan: MAC   Post-op Pain Management:    Induction: Intravenous  Airway Management Planned: Nasal Cannula  Additional Equipment:   Intra-op Plan:   Post-operative Plan:   Informed Consent: I have reviewed the patients History and Physical, chart, labs and discussed the procedure including the risks, benefits and alternatives for the proposed anesthesia with the patient or authorized representative who has indicated his/her understanding and acceptance.     Plan Discussed with: CRNA and Surgeon  Anesthesia Plan Comments:         Anesthesia Quick Evaluation

## 2014-06-18 NOTE — Discharge Instructions (Signed)
Discharge instructions given to wife. Unable to chart in epic. All instruction reviewed with Dr. Saralyn Pilar, patient and spouse.

## 2014-06-18 NOTE — Op Note (Signed)
Procedure: Elective cardioversion  Indication: Persistent atrial fibrillation  Anesthesia: MAC  Result: The patient was brought to the special cardiovascular holding area. Patient was in the fasting state. Following sedation, the patient received 3 delivered shocks at 50 J, 100 J and 200 J, and converted to sinus rhythm with frequent premature H her contractions.  Complications: None  Disposition: The patient was discharged home and scheduled to have follow-up with me in 1 week.

## 2014-06-18 NOTE — Transfer of Care (Signed)
Immediate Anesthesia Transfer of Care Note  Patient: Derrick Cochran  Procedure(s) Performed: Procedure(s): CARDIOVERSION (N/A)  Patient Location: PACU  Anesthesia Type:MAC  Level of Consciousness: awake, alert  and oriented  Airway & Oxygen Therapy: Patient Spontanous Breathing and Patient connected to nasal cannula oxygen  Post-op Assessment: Report given to RN  Post vital signs: stable  Last Vitals:  Filed Vitals:   06/18/14 0709  BP: 147/60  Temp: 39.7 C    Complications: No apparent anesthesia complications

## 2014-06-18 NOTE — Anesthesia Procedure Notes (Signed)
Procedure Name: MAC Date/Time: 06/18/2014 7:20 AM Performed by: Nelda Marseille Pre-anesthesia Checklist: Patient identified, Emergency Drugs available, Suction available, Patient being monitored and Timeout performed Patient Re-evaluated:Patient Re-evaluated prior to inductionOxygen Delivery Method: Nasal cannula Intubation Type: IV induction Placement Confirmation: breath sounds checked- equal and bilateral

## 2014-06-22 NOTE — Addendum Note (Signed)
Addendum  created 06/22/14 1717 by Gunnar Bulla, MD   Modules edited: Anesthesia Responsible Staff

## 2014-06-26 DIAGNOSIS — I2119 ST elevation (STEMI) myocardial infarction involving other coronary artery of inferior wall: Secondary | ICD-10-CM | POA: Diagnosis not present

## 2014-06-26 DIAGNOSIS — I4891 Unspecified atrial fibrillation: Secondary | ICD-10-CM | POA: Diagnosis not present

## 2014-06-26 DIAGNOSIS — I482 Chronic atrial fibrillation: Secondary | ICD-10-CM | POA: Diagnosis not present

## 2014-06-26 DIAGNOSIS — I214 Non-ST elevation (NSTEMI) myocardial infarction: Secondary | ICD-10-CM | POA: Diagnosis not present

## 2014-06-29 ENCOUNTER — Encounter
Admission: RE | Admit: 2014-06-29 | Discharge: 2014-06-29 | Disposition: A | Discharge status: Home / Self Care | Payer: Commercial Managed Care - HMO | Source: Ambulatory Visit | Attending: Cardiology | Admitting: Cardiology

## 2014-06-29 ENCOUNTER — Ambulatory Visit
Admission: RE | Admit: 2014-06-29 | Discharge: 2014-06-29 | Disposition: A | Discharge status: Home / Self Care | Payer: Commercial Managed Care - HMO | Source: Ambulatory Visit | Attending: Cardiology | Admitting: Cardiology

## 2014-06-29 DIAGNOSIS — R001 Bradycardia, unspecified: Secondary | ICD-10-CM | POA: Diagnosis not present

## 2014-06-29 DIAGNOSIS — I10 Essential (primary) hypertension: Secondary | ICD-10-CM

## 2014-06-29 DIAGNOSIS — I4891 Unspecified atrial fibrillation: Secondary | ICD-10-CM | POA: Diagnosis not present

## 2014-06-29 DIAGNOSIS — I495 Sick sinus syndrome: Secondary | ICD-10-CM | POA: Diagnosis present

## 2014-06-29 DIAGNOSIS — I2510 Atherosclerotic heart disease of native coronary artery without angina pectoris: Secondary | ICD-10-CM | POA: Diagnosis present

## 2014-06-29 DIAGNOSIS — Z8249 Family history of ischemic heart disease and other diseases of the circulatory system: Secondary | ICD-10-CM | POA: Diagnosis not present

## 2014-06-29 DIAGNOSIS — E785 Hyperlipidemia, unspecified: Secondary | ICD-10-CM | POA: Diagnosis present

## 2014-06-29 DIAGNOSIS — I482 Chronic atrial fibrillation: Secondary | ICD-10-CM | POA: Diagnosis present

## 2014-06-29 DIAGNOSIS — Z951 Presence of aortocoronary bypass graft: Secondary | ICD-10-CM | POA: Diagnosis not present

## 2014-06-29 DIAGNOSIS — E119 Type 2 diabetes mellitus without complications: Secondary | ICD-10-CM | POA: Diagnosis present

## 2014-06-29 DIAGNOSIS — Z45018 Encounter for adjustment and management of other part of cardiac pacemaker: Secondary | ICD-10-CM | POA: Diagnosis not present

## 2014-06-29 DIAGNOSIS — R0602 Shortness of breath: Secondary | ICD-10-CM | POA: Diagnosis not present

## 2014-06-29 DIAGNOSIS — Z882 Allergy status to sulfonamides status: Secondary | ICD-10-CM | POA: Diagnosis not present

## 2014-06-29 DIAGNOSIS — I252 Old myocardial infarction: Secondary | ICD-10-CM | POA: Diagnosis not present

## 2014-06-29 DIAGNOSIS — Z7901 Long term (current) use of anticoagulants: Secondary | ICD-10-CM | POA: Diagnosis not present

## 2014-06-29 DIAGNOSIS — K219 Gastro-esophageal reflux disease without esophagitis: Secondary | ICD-10-CM | POA: Diagnosis present

## 2014-06-29 DIAGNOSIS — N400 Enlarged prostate without lower urinary tract symptoms: Secondary | ICD-10-CM | POA: Diagnosis present

## 2014-06-29 HISTORY — DX: Reserved for inherently not codable concepts without codable children: IMO0001

## 2014-06-29 HISTORY — DX: Malignant (primary) neoplasm, unspecified: C80.1

## 2014-06-29 HISTORY — DX: Cardiac murmur, unspecified: R01.1

## 2014-06-29 LAB — APTT: APTT: 31 s (ref 24–36)

## 2014-06-29 LAB — DIFFERENTIAL
BASOS ABS: 0 10*3/uL (ref 0–0.1)
Basophils Relative: 1 %
Eosinophils Absolute: 0.3 10*3/uL (ref 0–0.7)
Eosinophils Relative: 5 %
Lymphs Abs: 1.3 10*3/uL (ref 1.0–3.6)
Monocytes Absolute: 0.6 10*3/uL (ref 0.2–1.0)
Monocytes Relative: 9 %
Neutro Abs: 4.2 10*3/uL (ref 1.4–6.5)
Neutrophils Relative %: 64 %

## 2014-06-29 LAB — CBC
HCT: 35.3 % — ABNORMAL LOW (ref 40.0–52.0)
HEMOGLOBIN: 11.9 g/dL — AB (ref 13.0–18.0)
MCH: 32.4 pg (ref 26.0–34.0)
MCHC: 33.6 g/dL (ref 32.0–36.0)
MCV: 96.5 fL (ref 80.0–100.0)
Platelets: 84 10*3/uL — ABNORMAL LOW (ref 150–440)
RBC: 3.65 MIL/uL — ABNORMAL LOW (ref 4.40–5.90)
RDW: 13.2 % (ref 11.5–14.5)
WBC: 6.4 10*3/uL (ref 3.8–10.6)

## 2014-06-29 LAB — BASIC METABOLIC PANEL
ANION GAP: 8 (ref 5–15)
BUN: 28 mg/dL — ABNORMAL HIGH (ref 6–20)
CALCIUM: 8.9 mg/dL (ref 8.9–10.3)
CO2: 28 mmol/L (ref 22–32)
Chloride: 106 mmol/L (ref 101–111)
Creatinine, Ser: 1.37 mg/dL — ABNORMAL HIGH (ref 0.61–1.24)
GFR calc non Af Amer: 48 mL/min — ABNORMAL LOW (ref >60)
GFR, EST AFRICAN AMERICAN: 56 mL/min — AB (ref >60)
Glucose, Bld: 125 mg/dL — ABNORMAL HIGH (ref 65–99)
Potassium: 4.2 mmol/L (ref 3.5–5.1)
SODIUM: 142 mmol/L (ref 135–145)

## 2014-06-29 LAB — PROTIME-INR
INR: 1.4
PROTHROMBIN TIME: 17.4 s — AB (ref 11.4–15.0)

## 2014-07-02 ENCOUNTER — Ambulatory Visit: Payer: Commercial Managed Care - HMO | Admitting: Anesthesiology

## 2014-07-02 ENCOUNTER — Inpatient Hospital Stay
Admission: RE | Admit: 2014-07-02 | Payer: Commercial Managed Care - HMO | Source: Ambulatory Visit | Admitting: Cardiology

## 2014-07-02 ENCOUNTER — Inpatient Hospital Stay
Admission: AD | Admit: 2014-07-02 | Discharge: 2014-07-04 | DRG: 244 | Disposition: A | Discharge status: Home / Self Care | Payer: Commercial Managed Care - HMO | Source: Ambulatory Visit | Attending: Cardiology | Admitting: Cardiology

## 2014-07-02 ENCOUNTER — Inpatient Hospital Stay: Payer: Commercial Managed Care - HMO

## 2014-07-02 ENCOUNTER — Encounter
Admission: AD | Disposition: A | Discharge status: Home / Self Care | Payer: Self-pay | Source: Ambulatory Visit | Attending: Cardiology

## 2014-07-02 ENCOUNTER — Inpatient Hospital Stay: Admit: 2014-07-02 | Payer: Self-pay | Admitting: Cardiology

## 2014-07-02 ENCOUNTER — Encounter: Payer: Self-pay | Admitting: *Deleted

## 2014-07-02 ENCOUNTER — Ambulatory Visit: Payer: Commercial Managed Care - HMO

## 2014-07-02 DIAGNOSIS — Z7901 Long term (current) use of anticoagulants: Secondary | ICD-10-CM

## 2014-07-02 DIAGNOSIS — I495 Sick sinus syndrome: Secondary | ICD-10-CM | POA: Diagnosis present

## 2014-07-02 DIAGNOSIS — N400 Enlarged prostate without lower urinary tract symptoms: Secondary | ICD-10-CM | POA: Diagnosis present

## 2014-07-02 DIAGNOSIS — I10 Essential (primary) hypertension: Secondary | ICD-10-CM | POA: Diagnosis present

## 2014-07-02 DIAGNOSIS — Z8249 Family history of ischemic heart disease and other diseases of the circulatory system: Secondary | ICD-10-CM

## 2014-07-02 DIAGNOSIS — I482 Chronic atrial fibrillation: Secondary | ICD-10-CM | POA: Diagnosis present

## 2014-07-02 DIAGNOSIS — Z951 Presence of aortocoronary bypass graft: Secondary | ICD-10-CM | POA: Diagnosis not present

## 2014-07-02 DIAGNOSIS — K219 Gastro-esophageal reflux disease without esophagitis: Secondary | ICD-10-CM | POA: Diagnosis present

## 2014-07-02 DIAGNOSIS — Z882 Allergy status to sulfonamides status: Secondary | ICD-10-CM

## 2014-07-02 DIAGNOSIS — I4891 Unspecified atrial fibrillation: Secondary | ICD-10-CM | POA: Diagnosis present

## 2014-07-02 DIAGNOSIS — I2510 Atherosclerotic heart disease of native coronary artery without angina pectoris: Secondary | ICD-10-CM | POA: Diagnosis present

## 2014-07-02 DIAGNOSIS — E119 Type 2 diabetes mellitus without complications: Secondary | ICD-10-CM | POA: Diagnosis present

## 2014-07-02 DIAGNOSIS — E785 Hyperlipidemia, unspecified: Secondary | ICD-10-CM | POA: Diagnosis present

## 2014-07-02 DIAGNOSIS — I252 Old myocardial infarction: Secondary | ICD-10-CM | POA: Diagnosis not present

## 2014-07-02 DIAGNOSIS — Z950 Presence of cardiac pacemaker: Secondary | ICD-10-CM

## 2014-07-02 HISTORY — PX: PACEMAKER INSERTION: SHX728

## 2014-07-02 LAB — GLUCOSE, CAPILLARY
Glucose-Capillary: 128 mg/dL — ABNORMAL HIGH (ref 65–99)
Glucose-Capillary: 119 mg/dL — ABNORMAL HIGH (ref 65–99)

## 2014-07-02 LAB — PROTIME-INR
INR: 1.13
Prothrombin Time: 14.7 seconds (ref 11.4–15.0)

## 2014-07-02 SURGERY — INSERTION, CARDIAC PACEMAKER
Anesthesia: General

## 2014-07-02 MED ORDER — SIMVASTATIN 40 MG PO TABS
40.0000 mg | ORAL_TABLET | Freq: Every day | ORAL | Status: DC
  Administered 2014-07-02 – 2014-07-03 (×2): 40 mg via ORAL
  Filled 2014-07-02 (×2): qty 1

## 2014-07-02 MED ORDER — SODIUM CHLORIDE 0.9 % IR SOLN
Freq: Once | Status: DC
  Filled 2014-07-02: qty 2

## 2014-07-02 MED ORDER — MIDAZOLAM HCL 5 MG/5ML IJ SOLN
INTRAMUSCULAR | Status: DC | PRN
  Administered 2014-07-02: 1 mg via INTRAVENOUS
  Administered 2014-07-02 (×2): 0.5 mg via INTRAVENOUS

## 2014-07-02 MED ORDER — GENTAMICIN SULFATE 40 MG/ML IJ SOLN
INTRAMUSCULAR | Status: AC
  Filled 2014-07-02: qty 2

## 2014-07-02 MED ORDER — ONDANSETRON HCL 4 MG/2ML IJ SOLN: 4.0000 mg | Freq: Four times a day (QID) | INTRAMUSCULAR | Status: DC | PRN

## 2014-07-02 MED ORDER — GLIPIZIDE 5 MG PO TABS
5.0000 mg | ORAL_TABLET | Freq: Every day | ORAL | Status: DC
  Administered 2014-07-03 – 2014-07-04 (×2): 5 mg via ORAL
  Filled 2014-07-02 (×3): qty 1

## 2014-07-02 MED ORDER — METOPROLOL SUCCINATE ER 50 MG PO TB24
50.0000 mg | ORAL_TABLET | Freq: Every day | ORAL | Status: DC
  Administered 2014-07-02 – 2014-07-04 (×3): 50 mg via ORAL
  Filled 2014-07-02 (×3): qty 1

## 2014-07-02 MED ORDER — SODIUM CHLORIDE 0.9 % IV SOLN
INTRAVENOUS | Status: DC
  Administered 2014-07-02 (×3): via INTRAVENOUS

## 2014-07-02 MED ORDER — PROPOFOL INFUSION 10 MG/ML OPTIME: INTRAVENOUS | Status: DC | PRN

## 2014-07-02 MED ORDER — PROPOFOL INFUSION 10 MG/ML OPTIME
INTRAVENOUS | Status: DC | PRN
  Administered 2014-07-02: 50 ug/kg/min via INTRAVENOUS

## 2014-07-02 MED ORDER — CEFAZOLIN SODIUM 1-5 GM-% IV SOLN
1.0000 g | Freq: Once | INTRAVENOUS | Status: AC
  Administered 2014-07-02: 1 g via INTRAVENOUS

## 2014-07-02 MED ORDER — SOTALOL HCL 80 MG PO TABS
80.0000 mg | ORAL_TABLET | Freq: Two times a day (BID) | ORAL | Status: DC
  Administered 2014-07-02 – 2014-07-04 (×4): 80 mg via ORAL
  Filled 2014-07-02 (×4): qty 1

## 2014-07-02 MED ORDER — TEMAZEPAM 15 MG PO CAPS
15.0000 mg | ORAL_CAPSULE | Freq: Every evening | ORAL | Status: DC | PRN
  Administered 2014-07-03: 15 mg via ORAL
  Filled 2014-07-02: qty 1

## 2014-07-02 MED ORDER — LEVOTHYROXINE SODIUM 137 MCG PO TABS
137.0000 ug | ORAL_TABLET | Freq: Every day | ORAL | Status: DC
  Administered 2014-07-03 – 2014-07-04 (×2): 137 ug via ORAL
  Filled 2014-07-02 (×3): qty 1

## 2014-07-02 MED ORDER — TAMSULOSIN HCL 0.4 MG PO CAPS
0.4000 mg | ORAL_CAPSULE | Freq: Every day | ORAL | Status: DC
  Administered 2014-07-02 – 2014-07-03 (×2): 0.4 mg via ORAL
  Filled 2014-07-02 (×2): qty 1

## 2014-07-02 MED ORDER — SODIUM CHLORIDE BACTERIOSTATIC 0.9 % IJ SOLN
INTRAMUSCULAR | Status: DC | PRN
  Administered 2014-07-02: 50 mL
  Administered 2014-07-02: 10 mL

## 2014-07-02 MED ORDER — FENTANYL CITRATE (PF) 100 MCG/2ML IJ SOLN: 25.0000 ug | INTRAMUSCULAR | Status: DC | PRN

## 2014-07-02 MED ORDER — METFORMIN HCL 500 MG PO TABS
500.0000 mg | ORAL_TABLET | Freq: Two times a day (BID) | ORAL | Status: DC
  Administered 2014-07-02 – 2014-07-04 (×4): 500 mg via ORAL
  Filled 2014-07-02 (×4): qty 1

## 2014-07-02 MED ORDER — GABAPENTIN 300 MG PO CAPS
300.0000 mg | ORAL_CAPSULE | Freq: Every day | ORAL | Status: DC
  Administered 2014-07-02 – 2014-07-03 (×2): 300 mg via ORAL
  Filled 2014-07-02 (×2): qty 1

## 2014-07-02 MED ORDER — ONDANSETRON HCL 4 MG/2ML IJ SOLN: 4.0000 mg | Freq: Once | INTRAMUSCULAR | Status: DC | PRN

## 2014-07-02 MED ORDER — LIDOCAINE 1 % OPTIME INJ - NO CHARGE
INTRAMUSCULAR | Status: DC | PRN
  Administered 2014-07-02: 30 mL

## 2014-07-02 MED ORDER — FENTANYL CITRATE (PF) 100 MCG/2ML IJ SOLN
INTRAMUSCULAR | Status: DC | PRN
  Administered 2014-07-02: 50 ug via INTRAVENOUS

## 2014-07-02 MED ORDER — ENALAPRIL MALEATE 5 MG PO TABS
5.0000 mg | ORAL_TABLET | Freq: Two times a day (BID) | ORAL | Status: DC
  Administered 2014-07-02 – 2014-07-04 (×4): 5 mg via ORAL
  Filled 2014-07-02 (×4): qty 1

## 2014-07-02 MED ORDER — ACETAMINOPHEN 325 MG PO TABS: 325.0000 mg | ORAL_TABLET | ORAL | Status: DC | PRN

## 2014-07-02 MED ORDER — FUROSEMIDE 20 MG PO TABS
20.0000 mg | ORAL_TABLET | Freq: Every day | ORAL | Status: DC
  Administered 2014-07-03 – 2014-07-04 (×2): 20 mg via ORAL
  Filled 2014-07-02 (×2): qty 1

## 2014-07-02 MED ORDER — SODIUM CHLORIDE 0.9 % IR SOLN
Status: DC | PRN
  Administered 2014-07-02: 180 mL

## 2014-07-02 MED ORDER — HYDROCODONE-ACETAMINOPHEN 5-325 MG PO TABS: 1.0000 | ORAL_TABLET | ORAL | Status: DC | PRN

## 2014-07-02 MED ORDER — DIPHENHYDRAMINE HCL 25 MG PO CAPS
25.0000 mg | ORAL_CAPSULE | Freq: Three times a day (TID) | ORAL | Status: DC | PRN
  Administered 2014-07-02 – 2014-07-04 (×3): 25 mg via ORAL
  Filled 2014-07-02 (×3): qty 1

## 2014-07-02 MED ORDER — PANTOPRAZOLE SODIUM 40 MG PO TBEC
40.0000 mg | DELAYED_RELEASE_TABLET | Freq: Every day | ORAL | Status: DC
  Administered 2014-07-03 – 2014-07-04 (×2): 40 mg via ORAL
  Filled 2014-07-02 (×2): qty 1

## 2014-07-02 MED ORDER — CEFAZOLIN SODIUM 1-5 GM-% IV SOLN
1.0000 g | Freq: Four times a day (QID) | INTRAVENOUS | Status: AC
  Administered 2014-07-02 – 2014-07-03 (×3): 1 g via INTRAVENOUS
  Filled 2014-07-02 (×3): qty 50

## 2014-07-02 SURGICAL SUPPLY — 40 items
7 FR. SHEATH ×2 IMPLANT
9FR SHEATH ×2 IMPLANT
BAG DECANTER STRL (MISCELLANEOUS) IMPLANT
BRUSH SCRUB 4% CHG (MISCELLANEOUS) ×2 IMPLANT
CANISTER SUCT 1200ML W/VALVE (MISCELLANEOUS) ×2 IMPLANT
CHLORAPREP W/TINT 26ML (MISCELLANEOUS) ×2 IMPLANT
CNTNR SPEC 2.5X3XGRAD LEK (MISCELLANEOUS) ×1
CONT SPEC 4OZ STER OR WHT (MISCELLANEOUS) ×1
CONTAINER SPEC 2.5X3XGRAD LEK (MISCELLANEOUS) ×1 IMPLANT
COVER LIGHT HANDLE STERIS (MISCELLANEOUS) ×2 IMPLANT
COVER MAYO STAND STRL (DRAPES) ×2 IMPLANT
DISPOSABLE SURGICAL CABLE ×2 IMPLANT
DRAPE C-ARM XRAY 36X54 (DRAPES) ×2 IMPLANT
DRESSING TELFA 4X3 1S ST N-ADH (GAUZE/BANDAGES/DRESSINGS) ×2 IMPLANT
DRSG TEGADERM 4X4.75 (GAUZE/BANDAGES/DRESSINGS) ×2 IMPLANT
GLOVE BIO SURGEON STRL SZ7.5 (GLOVE) ×6 IMPLANT
GLOVE BIO SURGEON STRL SZ8 (GLOVE) ×2 IMPLANT
GOWN STRL REUS W/ TWL LRG LVL3 (GOWN DISPOSABLE) ×2 IMPLANT
GOWN STRL REUS W/TWL LRG LVL3 (GOWN DISPOSABLE) ×2
IMMOBILIZER SHDR MD LX WHT (SOFTGOODS) IMPLANT
IMMOBILIZER SHDR XL LX WHT (SOFTGOODS) ×2 IMPLANT
INTRO PACEMAKR LEAD 9FR 13CM (INTRODUCER) ×2
INTRO PACEMKR SHEATH II 7FR (MISCELLANEOUS) ×2
INTRODUCER PACEMKR LD 9FR 13CM (INTRODUCER) ×1 IMPLANT
INTRODUCER PACEMKR SHTH II 7FR (MISCELLANEOUS) ×1 IMPLANT
IV NS 500ML (IV SOLUTION)
IV NS 500ML BAXH (IV SOLUTION) IMPLANT
KIT RM TURNOVER STRD PROC AR (KITS) ×2 IMPLANT
LABEL OR SOLS (LABEL) ×2 IMPLANT
LEAD CAPSURE NOVUS 5076-52CM (Lead) ×2 IMPLANT
LEAD CAPSURE NOVUS 5076-58CM (Lead) ×2 IMPLANT
MARKER SKIN W/RULER 31145785 (MISCELLANEOUS) ×2 IMPLANT
NEEDLE FILTER BLUNT 18X 1/2SAF (NEEDLE) ×1
NEEDLE FILTER BLUNT 18X1 1/2 (NEEDLE) ×1 IMPLANT
PACK PACE INSERTION (MISCELLANEOUS) ×2 IMPLANT
PAD GROUND ADULT SPLIT (MISCELLANEOUS) ×2 IMPLANT
PAD STATPAD (MISCELLANEOUS) ×2 IMPLANT
PPM ADVISA MRI DR A2DR01 (Pacemaker) ×2 IMPLANT
SUT SILK 0 SH 30 (SUTURE) ×6 IMPLANT
SYR 3ML LL SCALE MARK (SYRINGE) ×2 IMPLANT

## 2014-07-02 NOTE — Anesthesia Preprocedure Evaluation (Addendum)
Anesthesia Evaluation  Patient identified by MRN, date of birth, ID band Patient awake    Reviewed: Allergy & Precautions, NPO status   History of Anesthesia Complications Negative for: history of anesthetic complications  Airway Mallampati: II       Dental no notable dental hx. (+) Teeth Intact   Pulmonary shortness of breath, former smoker,    Pulmonary exam normal       Cardiovascular hypertension, Pt. on medications + CAD, + Past MI and + DOE Rhythm:Regular Rate:Normal     Neuro/Psych negative neurological ROS  negative psych ROS   GI/Hepatic Neg liver ROS, GERD-  ,  Endo/Other  diabetes, Type 2, Oral Hypoglycemic AgentsHypothyroidism   Renal/GU negative Renal ROS  negative genitourinary   Musculoskeletal  (+) Arthritis -, Osteoarthritis,    Abdominal Normal abdominal exam  (+)   Peds negative pediatric ROS (+)  Hematology negative hematology ROS (+)   Anesthesia Other Findings   Reproductive/Obstetrics                            Anesthesia Physical Anesthesia Plan  ASA: III  Anesthesia Plan: General   Post-op Pain Management:    Induction: Intravenous  Airway Management Planned: Nasal Cannula  Additional Equipment:   Intra-op Plan:   Post-operative Plan:   Informed Consent: I have reviewed the patients History and Physical, chart, labs and discussed the procedure including the risks, benefits and alternatives for the proposed anesthesia with the patient or authorized representative who has indicated his/her understanding and acceptance.     Plan Discussed with: CRNA and Surgeon  Anesthesia Plan Comments:        Anesthesia Quick Evaluation

## 2014-07-02 NOTE — OR Nursing (Signed)
10 ml. Isovue - 300 IV by [facility] CRNA

## 2014-07-02 NOTE — Anesthesia Postprocedure Evaluation (Signed)
  Anesthesia Post-op Note  Patient: Derrick Cochran  Procedure(s) Performed: Procedure(s): INSERTION PACEMAKER/DUAL CHAMBER  INITIAL IMPLANT (N/A)  Anesthesia type:General  Patient location: PACU  Post pain: Pain level controlled  Post assessment: Post-op Vital signs reviewed, Patient's Cardiovascular Status Stable, Respiratory Function Stable, Patent Airway and No signs of Nausea or vomiting  Post vital signs: Reviewed and stable  Last Vitals:  Filed Vitals:   07/02/14 1408  BP: 130/65  Pulse: 74  Temp: 36.4 C  Resp: 21    Level of consciousness: awake, alert  and patient cooperative  Complications: No apparent anesthesia complications

## 2014-07-02 NOTE — Progress Notes (Addendum)
Dr. Ubaldo Glassing notified of rash/allergic reaction. Orders received:   Remove adhesive bandage - leave open to steri strips Benadryl 25 Q8h PRN PO (clarified with read-back)  Toniann Ket

## 2014-07-02 NOTE — Op Note (Signed)
[facility]   07/02/2014                     2:10 PM  PATIENT:  Derrick Cochran    PRE-OPERATIVE DIAGNOSIS:  sick sinus syndrome  POST-OPERATIVE DIAGNOSIS:  Same  PROCEDURE:  INSERTION PACEMAKER/DUAL CHAMBER  INITIAL IMPLANT  SURGEON:  Kumari Sculley, MD    ANESTHESIA:     PREOPERATIVE INDICATIONS:  [name] is a  79 y.o. male with a diagnosis of sick sinus syndrome who failed conservative measures and elected for surgical management.  Patient is status post TAVR at [facility] 04/01/14. Patient was in cardiac rehabilitation when he experienced shortness of breath with generalized weakness, sent emergency room where he was noted to be in atrial fibrillation 24-hour Holter monitor was performed which revealed persistent atrial fibrillation with a rate of 65 bpm. Monitor was performed which revealed episodes of junctional bradycardia, sinus bradycardia alternating with episodes of age fibrillation with a rapid ventricular rate. Patient underwent recent elective cardioversion conversion to sinus bradycardia, however the patient reverted back to atrial fibrillation with a rapid ventricular rate.  The risks benefits and alternatives were discussed with the patient preoperatively including but not limited to the risks of infection, bleeding, cardiopulmonary complications, the need for revision surgery, among others, and the patient was willing to proceed.   OPERATIVE PROCEDURE: The patient was brought to the operating room the fasting state. Left pectoral region was prepped and draped in usual sterile manner. Local anesthesia was obtained with 1% Xylocaine locally. A 6 cm incision was performed her left pectoral region. Pacemaker pocket was generated by electrocautery and blunt dissection. Access was obtained to left subclavian vein by fine needle aspiration. Y compatible  ventricular and atrial leads were positioned into the right ventricular apical septum and right atrial appendage under fluoroscopic guidance. After proper thresholds were obtained the leads were sutured in place. The pacemaker pocket was irrigated with gentamicin solution. Leads were connected to a  MRI compatible dual-chamber rate responsive pacemaker generator (chronic advice ) and positioned into the pocket and pocket was closed with 20 and 4-0 Vicryl, respectively. Steri-Strips and a pressure dressing were applied.

## 2014-07-02 NOTE — Progress Notes (Signed)
Pre procedure report given to Jerolyn Shin by Phillips Grout, RN

## 2014-07-02 NOTE — Progress Notes (Signed)
Telemetry monitors showing inconsistent v-pacing with a/fib, heart rate 80-90's. Dr. Saralyn Pilar notified. Continue to monitor.  Derrick Cochran

## 2014-07-02 NOTE — H&P (View-Only) (Signed)
Progress Notes   Derrick Cochran (MR# [id number])        Progress Notes Info     Author Note Status Last Update User Last Update Date/Time   Derrick Cowman, MD Signed Derrick Cowman, MD Wed Jun 03, 2014 9:53 AM    Progress Notes    Expand All Collapse All   Established Patient Visit   Chief Complaint: Chief Complaint  Patient presents with  . Follow-up    Discuss a pacemaker    Date of Service: 06/03/2014 Date of Birth: 01/31/1936 PCP: Derrick Crouch, MD, MD  History of Present Illness: [name] is a 79 y.o.male patient who returns for coronary artery disease, persistent atrial fibrillation, aortic stenosis status post TAVR, hypertension and hyperlipidemia. The patient is status post CABG x4 11/10/1993. Cardiac catheterization 12/04/2013 revealed occluded LAD, occluded left circumflex, 80% stenosis RCA, patent LIMA to LAD, patent SVG to D 1, patent SVG to OM1 and 60% stenosis SVG to PDA. The patient denies chest pain. He has mild exertional dyspnea. He denies palpitations or heart racing. He denies peripheral edema. The patient has not experienced presyncope or syncope. The patient is not active and does not exercise regularly.  The patient underwent TAVR on 04/01/2014 at [facility]. The patient started cardiac rehabilitation but experienced progressive generalized weakness and shortness of breath. He went to the emergency room on 04/14/2014 at which time he was noted to be in atrial fibrillation with a controlled ventricular rate at 68 beats per minute. 24 hour Holter monitor revealed persistent atrial fibrillation with a mean heart rate of 65 beats per minute. The patient has a chads Vasc score 5 and currently is on chronic warfarin therapy. The patient was seen in follow-up at [facility] status post TAVR. Echocardiogram was performed on 05/07/2014 which revealed normal left ventricular function, LV ejection fraction of  55-60% with stable appearing aortic valve prosthesis with a mean gradient of 14.5 mm Hg. Thirty day loop monitor was performed however results are unavailable at time of this dictation. The patient was told that he would require a permanent pacemaker based on loop monitor results. EKG today reveals atrial fibrillation at a rate of 81 beats per minute.  Patient has essential hypertension, blood pressure today under excellent control on current BP medications including enalapril and metoprolol, which are tolerated well without apparent side effects.  The patient has hyperlipidemia, currently on simvastatin, which is tolerated well without apparent side-effects.  The patient has type 2 diabetes, currently on oral agents, including glipizide and metformin, which is tolerated well, followed by his primary care provider.  Past Medical and Surgical History  Past Medical History Past Medical History  Diagnosis Date  . Inferior myocardial infarction 1994  . Diabetes mellitus type 2, uncomplicated   . Hypothyroidism   . Hypertension   . BPH (benign prostatic hypertrophy)   . Former smoker   . Hyperlipidemia   . Abdominal aneurysm   . Rash     - annularis  . GERD (gastroesophageal reflux disease)   . DDD (degenerative disc disease)     w/ back pain  . Esophageal stricture   . Lone atrial fibrillation 7/98  . Chronic atrial fibrillation since 5/09   . Aortic stenosis, mild   . Rotator cuff syndrome   . Hx of CABG 12/25/2013    1. CABG x 4 performed 11/10/1993 with Dr. Rosana Hoes here at [facility]     Past Surgical History He has past surgical history  that includes Quadruple coronary artery bypass (11/10/1993); Hernia repair (Left); Appendectomy; Foot surgery for nerve impingement (Left); PTCA of RCA (5/94 and 9/94); Coronary artery bypass graft; Cardiac catheterization; colonoscopy (08/02/2010); egd (11/07/2012); and Colonoscopy  (12/18/2013).   Medications and Allergies  Current Medications   Current Medications    Current Outpatient Prescriptions  Medication Sig Dispense Refill  . ciprofloxacin HCl (CIPRO) 250 MG tablet     . clopidogrel (PLAVIX) 75 mg tablet Take 75 mg by mouth once daily.    . enalapril (VASOTEC) 5 MG tablet Take 1 tablet (5 mg total) by mouth 2 (two) times daily. 180 tablet 3  . FUROsemide (LASIX) 20 MG tablet Take 1 tablet (20 mg total) by mouth once daily. 30 tablet 11  . gabapentin (NEURONTIN) 300 MG capsule Take 1 capsule (300 mg total) by mouth nightly. 90 capsule 3  . glipiZIDE (GLUCOTROL XL) 5 MG XL tablet Take 1 tablet (5 mg total) by mouth once daily. 90 tablet 3  . HYDROcodone-acetaminophen (NORCO) 5-325 mg tablet     . levothyroxine (SYNTHROID, LEVOTHROID) 137 MCG tablet Take 137 mcg by mouth once daily. 90 tablet 3  . metFORMIN (GLUCOPHAGE) 500 MG tablet Take 1 tablet (500 mg total) by mouth 2 (two) times daily with meals. 180 tablet 3  . metoprolol succinate (TOPROL-XL) 25 MG XL tablet Take 1 tablet (25 mg total) by mouth once daily. 90 tablet 3  . omeprazole (PRILOSEC) 20 MG DR capsule Take 1 capsule (20 mg total) by mouth once daily. 90 capsule 3  . simvastatin (ZOCOR) 40 MG tablet Take 1 tablet (40 mg total) by mouth nightly. 90 tablet 3  . tamsulosin (FLOMAX) 0.4 mg capsule Take 1 capsule (0.4 mg total) by mouth once daily. Take 30 minutes after same meal each day. 90 capsule 3  . temazepam (RESTORIL) 15 mg capsule TAKE ONE CAPSULE BY MOUTH AT BEDTIME FOR SLEEP 30 capsule 4  . warfarin (COUMADIN) 1 MG tablet TAKE 1 AND 1/2 TABLETS BY MOUTH DAILY, TAKE WITH 3 MG FOR TOTAL DAILY DOSE OF 4.5 MG. 135 tablet 2  . warfarin (COUMADIN) 3 MG tablet Take 1 tablet (3 mg total) by mouth once daily. 90 tablet 3   No current facility-administered medications for this visit.      Allergies: Sulfa  (sulfonamide antibiotics) and Sulfamethoxazole  Social and Family History  Social History  reports that he quit smoking about 28 years ago. He has never used smokeless tobacco. He reports that he does not drink alcohol or use illicit drugs.  Family History Family History  Problem Relation Age of Onset  . Lung cancer Father   . Heart attack Father   . Coronary artery disease Sister 22    s/p angioplasty  . Colon cancer Neg Hx   . Colon polyps Neg Hx   . Liver disease Neg Hx   . Rectal cancer Neg Hx   . Ulcers Neg Hx   . Heart attack Maternal Grandfather     Review of Systems   Review of Systems: The patient denies chest pain, reports chronic exertional shortness of breath, without orthopnea, paroxysmal nocturnal dyspnea, pedal edema, palpitations, heart racing, presyncope, syncope. Review of 12 Systems is negative except as described above.  Physical Examination   Vitals:BP 128/72 mmHg  Pulse 60  Ht 182.9 cm (6')  Wt 84.097 kg (185 lb 6.4 oz)  BMI 25.14 kg/m2 Ht:182.9 cm (6') Wt:84.097 kg (185 lb 6.4 oz) TGG:YIRS surface area is 2.07 meters  squared. Body mass index is 25.14 kg/(m^2).  HEENT: Pupils equally reactive to light and accomodation Neck: Supple without thyromegaly, carotid pulses 2+ Lungs: clear to auscultation bilaterally; no wheezes, rales, rhonchi Heart: Irregular irregular rhythm. No gallops, murmurs or rub Abdomen: soft nontender, nondistended, with normal bowel sounds Extremities: no cyanosis, clubbing, or edema Peripheral Pulses: 2+ in all extremities, 2+ femoral pulses bilaterally  Assessment   79 y.o. male with  1. Chronic atrial fibrillation   2. Aortic stenosis, mild   3. Aortic stenosis, severe   4. Essential hypertension with goal blood pressure less than 140/90   5. Lone atrial fibrillation   6. Non-ST elevation (NSTEMI) myocardial infarction   7. Diabetes mellitus type  2, uncomplicated   8. Ischemic chest pain   9. H/O cardiac catheterization   10. Hx of CABG   11. Pure hypercholesterolemia   12. S/p TAVR (transcatheter aortic valve replacement), bioprosthetic   13. Bradycardia    79 year old gentleman with known CAD, status post CABG, currently without chest pain. The patient is status post TAVR with recent echocardiogram revealing normal left ventricular function and stable appearing aortic valve prosthesis. The patient has persistent atrial fibrillation, chads Vasc score 5, currently on warfarin, experiencing exertional dyspnea and generalized fatigue. The patient had recent follow-up [facility] which time he was instructed that he may require permanent pacemaker based on loop monitor results. The patient has essential hypertension, blood pressure under good control on current medications. Patient has hyperlipidemia, currently on simvastatin which is well tolerated. He has type 2 diabetes, currently on oral agents, followed by his primary care provider.    Plan   1. Continue current medications 2. Continue warfarin for stroke prevention 3. Obtain a loop monitor results 4. Counseled patient about low-sodium diet 5. DASH diet printed instructions given to the patient  6. Counseled patient about low-cholesterol diet 7. Continue simvastatin for hyperlipidemia management 8. Low-fat and cholesterol diet printed instructions given to the patient 9. Diabetes diet printed instructions given to the patient 10. Proceed with elective cardioversion. If successful then will start sotalol 80 mg b.i.d. for maintenance of sinus rhythm. 11. Return to clinic after elective cardioversion  Orders Placed This Encounter  Procedures  . ECG 12-lead    Return in about 3 weeks (around 06/24/2014).  Derrick Cochran, [facility]   [address] [place] [address]     Department    Name Address Phone Fax   [facility] [place] [address] [phone]     Progress Notes   ENIO HORNBACK (MR# [id number])        Progress Notes Info     Author Note Status Last Update User Last Update Date/Time   Derrick Cowman, MD Signed Derrick Cowman, MD Wed Jun 03, 2014 9:53 AM    Progress Notes    Expand All Collapse All   Established Patient Visit   Chief Complaint: Chief Complaint  Patient presents with  . Follow-up    Discuss a pacemaker    Date of Service: 06/03/2014 Date of Birth: 19-Jun-1935 PCP: Derrick Crouch, MD, MD  History of Present Illness: Mr. Jacquin is a 79 y.o.male patient who returns for coronary artery disease, persistent atrial fibrillation, aortic stenosis status post TAVR, hypertension and hyperlipidemia. The patient is status post CABG x4 11/10/1993. Cardiac catheterization 12/04/2013 revealed occluded LAD, occluded left circumflex,  80% stenosis RCA, patent LIMA to LAD, patent SVG to D 1, patent SVG to OM1 and 60% stenosis SVG to PDA. The patient denies chest pain. He has mild exertional dyspnea. He denies palpitations or heart racing. He denies peripheral edema. The patient has not experienced presyncope or syncope. The patient is not active and does not exercise regularly.  The patient underwent TAVR on 04/01/2014 at [facility]. The patient started cardiac rehabilitation but experienced progressive generalized weakness and shortness of breath. He went to the emergency room on 04/14/2014 at which time he was noted to be in atrial fibrillation with a controlled ventricular rate at 68 beats per minute. 24 hour Holter monitor revealed persistent atrial fibrillation with a mean heart rate of 65 beats per minute. The patient has a chads Vasc score 5 and currently is on chronic warfarin therapy. The patient was seen in follow-up at [facility] status post TAVR. Echocardiogram was performed on 05/07/2014 which revealed normal left ventricular function, LV ejection fraction of 55-60% with stable appearing aortic valve prosthesis with a mean gradient of 14.5 mm Hg. Thirty day loop monitor was performed however results are unavailable at time of this dictation. The patient was told that he would require a permanent pacemaker based on loop monitor results. EKG today reveals atrial fibrillation at a rate of 81 beats per minute.  Patient has essential hypertension, blood pressure today under excellent control on current BP medications including enalapril and metoprolol, which are tolerated well without apparent side effects.  The patient has hyperlipidemia, currently on simvastatin, which is tolerated well without apparent side-effects.  The patient has type 2 diabetes, currently on oral agents, including glipizide and metformin, which is tolerated well, followed by his primary care provider.  Past Medical and Surgical History  Past Medical History Past Medical History  Diagnosis Date  . Inferior myocardial infarction 1994  . Diabetes mellitus type 2, uncomplicated   . Hypothyroidism   . Hypertension   . BPH (benign prostatic hypertrophy)   . Former smoker   . Hyperlipidemia   . Abdominal aneurysm   . Rash     - annularis  . GERD (gastroesophageal reflux disease)   . DDD (degenerative disc disease)     w/ back pain  . Esophageal stricture   . Lone atrial fibrillation 7/98  . Chronic atrial fibrillation since 5/09   . Aortic stenosis, mild   . Rotator cuff syndrome   . Hx of CABG 12/25/2013    1. CABG x 4 performed 11/10/1993 with Dr. Rosana Hoes here at [facility]     Past Surgical History He has past surgical history that includes Quadruple coronary artery bypass (11/10/1993); Hernia repair (Left); Appendectomy; Foot surgery for nerve impingement (Left); PTCA of  RCA (5/94 and 9/94); Coronary artery bypass graft; Cardiac catheterization; colonoscopy (08/02/2010); egd (11/07/2012); and Colonoscopy (12/18/2013).   Medications and Allergies  Current Medications   Current Medications    Current Outpatient Prescriptions  Medication Sig Dispense Refill  . ciprofloxacin HCl (CIPRO) 250 MG tablet     . clopidogrel (PLAVIX) 75 mg tablet Take 75 mg by mouth once daily.    . enalapril (VASOTEC) 5 MG tablet Take 1 tablet (5 mg total) by mouth 2 (two) times daily. 180 tablet 3  . FUROsemide (LASIX) 20 MG tablet Take 1 tablet (20 mg total) by mouth once daily. 30 tablet 11  . gabapentin (NEURONTIN) 300 MG capsule Take 1 capsule (300 mg total) by mouth nightly. [place]  capsule 3  . glipiZIDE (GLUCOTROL XL) 5 MG XL tablet Take 1 tablet (5 mg total) by mouth once daily. 90 tablet 3  . HYDROcodone-acetaminophen (NORCO) 5-325 mg tablet     . levothyroxine (SYNTHROID, LEVOTHROID) 137 MCG tablet Take 137 mcg by mouth once daily. 90 tablet 3  . metFORMIN (GLUCOPHAGE) 500 MG tablet Take 1 tablet (500 mg total) by mouth 2 (two) times daily with meals. 180 tablet 3  . metoprolol succinate (TOPROL-XL) 25 MG XL tablet Take 1 tablet (25 mg total) by mouth once daily. 90 tablet 3  . omeprazole (PRILOSEC) 20 MG DR capsule Take 1 capsule (20 mg total) by mouth once daily. 90 capsule 3  . simvastatin (ZOCOR) 40 MG tablet Take 1 tablet (40 mg total) by mouth nightly. 90 tablet 3  . tamsulosin (FLOMAX) 0.4 mg capsule Take 1 capsule (0.4 mg total) by mouth once daily. Take 30 minutes after same meal each day. 90 capsule 3  . temazepam (RESTORIL) 15 mg capsule TAKE ONE CAPSULE BY MOUTH AT BEDTIME FOR SLEEP 30 capsule 4  . warfarin (COUMADIN) 1 MG tablet TAKE 1 AND 1/2 TABLETS BY MOUTH DAILY, TAKE WITH 3 MG FOR TOTAL DAILY DOSE OF 4.5 MG. 135 tablet 2  . warfarin (COUMADIN) 3 MG tablet Take 1 tablet (3 mg  total) by mouth once daily. 90 tablet 3   No current facility-administered medications for this visit.      Allergies: Sulfa (sulfonamide antibiotics) and Sulfamethoxazole  Social and Family History  Social History  reports that he quit smoking about 28 years ago. He has never used smokeless tobacco. He reports that he does not drink alcohol or use illicit drugs.  Family History Family History  Problem Relation Age of Onset  . Lung cancer Father   . Heart attack Father   . Coronary artery disease Sister 27    s/p angioplasty  . Colon cancer Neg Hx   . Colon polyps Neg Hx   . Liver disease Neg Hx   . Rectal cancer Neg Hx   . Ulcers Neg Hx   . Heart attack Maternal Grandfather     Review of Systems   Review of Systems: The patient denies chest pain, reports chronic exertional shortness of breath, without orthopnea, paroxysmal nocturnal dyspnea, pedal edema, palpitations, heart racing, presyncope, syncope. Review of 12 Systems is negative except as described above.  Physical Examination   Vitals:BP 128/72 mmHg  Pulse 60  Ht 182.9 cm (6')  Wt 84.097 kg (185 lb 6.4 oz)  BMI 25.14 kg/m2 Ht:182.9 cm (6') Wt:84.097 kg (185 lb 6.4 oz) YPP:JKDT surface area is 2.07 meters squared. Body mass index is 25.14 kg/(m^2).  HEENT: Pupils equally reactive to light and accomodation Neck: Supple without thyromegaly, carotid pulses 2+ Lungs: clear to auscultation bilaterally; no wheezes, rales, rhonchi Heart: Irregular irregular rhythm. No gallops, murmurs or rub Abdomen: soft nontender, nondistended, with normal bowel sounds Extremities: no cyanosis, clubbing, or edema Peripheral Pulses: 2+ in all extremities, 2+ femoral pulses bilaterally  Assessment   79 y.o. male with  1. Chronic atrial fibrillation   2. Aortic stenosis, mild   3. Aortic stenosis, severe   4. Essential hypertension with goal blood pressure  less than 140/90   5. Lone atrial fibrillation   6. Non-ST elevation (NSTEMI) myocardial infarction   7. Diabetes mellitus type 2, uncomplicated   8. Ischemic chest pain   9. H/O cardiac catheterization   10. Hx of CABG   11.  Pure hypercholesterolemia   12. S/p TAVR (transcatheter aortic valve replacement), bioprosthetic   13. Bradycardia    79 year old gentleman with known CAD, status post CABG, currently without chest pain. The patient is status post TAVR with recent echocardiogram revealing normal left ventricular function and stable appearing aortic valve prosthesis. The patient has persistent atrial fibrillation, chads Vasc score 5, currently on warfarin, experiencing exertional dyspnea and generalized fatigue. The patient had recent follow-up [facility] which time he was instructed that he may require permanent pacemaker based on loop monitor results. The patient has essential hypertension, blood pressure under good control on current medications. Patient has hyperlipidemia, currently on simvastatin which is well tolerated. He has type 2 diabetes, currently on oral agents, followed by his primary care provider.    Plan   1. Continue current medications 2. Continue warfarin for stroke prevention 3. Obtain a loop monitor results 4. Counseled patient about low-sodium diet 5. DASH diet printed instructions given to the patient  6. Counseled patient about low-cholesterol diet 7. Continue simvastatin for hyperlipidemia management 8. Low-fat and cholesterol diet printed instructions given to the patient 9. Diabetes diet printed instructions given to the patient 10. Proceed with elective cardioversion. If successful then will start sotalol 80 mg b.i.d. for maintenance of sinus rhythm. 11. Return to clinic after elective cardioversion  Orders Placed This Encounter  Procedures  . ECG 12-lead    Return in about 3 weeks (around  06/24/2014).  Derrick Cochran, [facility]   [address] [place] [address]    Department    Name Address Phone Fax   [facility] [place] [place]  [phone]

## 2014-07-02 NOTE — Progress Notes (Signed)
Patient breaking out in rash across upper chest. Patient thinks it may be from adhesive dressing. Dr. Saralyn Pilar paged. Waiting for call back.  Toniann Ket

## 2014-07-02 NOTE — OR Nursing (Signed)
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

## 2014-07-02 NOTE — Transfer of Care (Signed)
Immediate Anesthesia Transfer of Care Note  Patient: Derrick Cochran  Procedure(s) Performed: Procedure(s): INSERTION PACEMAKER/DUAL CHAMBER  INITIAL IMPLANT (N/A)  Patient Location: PACU  Anesthesia Type:MAC  Level of Consciousness: awake, sedated and patient cooperative  Airway & Oxygen Therapy: Patient Spontanous Breathing and Patient connected to face mask oxygen  Post-op Assessment: Report given to RN, Post -op Vital signs reviewed and stable and Patient moving all extremities X 4  Post vital signs: stable  Last Vitals:  Filed Vitals:   07/02/14 1408  BP: 130/65  Pulse: 74  Temp: 36.4 C  Resp: 21    Complications: No apparent anesthesia complications

## 2014-07-02 NOTE — Interval H&P Note (Signed)
History and Physical Interval Note:  07/02/2014 12:02 PM  Derrick Cochran  has presented today for surgery, with the diagnosis of sick sinus syndrome The various methods of treatment have been discussed with the patient and family. After consideration of risks, benefits and other options for treatment, the patient has consented to pacemaker as a surgical intervention .  The patient's history has been reviewed, patient examined, no change in status, stable for surgery.  I have reviewed the patient's chart and labs.  Questions were answered to the patient's satisfaction.     [name]

## 2014-07-03 ENCOUNTER — Encounter: Payer: Self-pay | Admitting: Cardiology

## 2014-07-03 DIAGNOSIS — I495 Sick sinus syndrome: Secondary | ICD-10-CM

## 2014-07-03 MED ORDER — SOTALOL HCL 80 MG PO TABS: 80.0000 mg | ORAL_TABLET | Freq: Two times a day (BID) | ORAL | Status: DC

## 2014-07-03 MED ORDER — METOPROLOL SUCCINATE ER 50 MG PO TB24: 50.0000 mg | ORAL_TABLET | Freq: Every day | ORAL | Status: DC

## 2014-07-03 MED ORDER — CEPHALEXIN 250 MG PO CAPS: 250.0000 mg | ORAL_CAPSULE | Freq: Four times a day (QID) | ORAL | Status: DC

## 2014-07-03 MED ORDER — CEPHALEXIN 250 MG PO CAPS
250.0000 mg | ORAL_CAPSULE | Freq: Four times a day (QID) | ORAL | Status: DC
  Administered 2014-07-03 – 2014-07-04 (×5): 250 mg via ORAL
  Filled 2014-07-03 (×5): qty 1

## 2014-07-03 MED ORDER — CEPHALEXIN 250 MG PO CAPS: 250.0000 mg | ORAL_CAPSULE | Freq: Four times a day (QID) | ORAL | Status: AC

## 2014-07-03 NOTE — Progress Notes (Signed)
Alert and oriented. Afib and paced on telemetry. Rested quietly most of the day. No complaints. Family at bedside today. Full assessment as charted. Walked with nursing twice today. Up to chair for meals. Possible discharge tomorrow.  Toniann Ket

## 2014-07-03 NOTE — Plan of Care (Signed)
Problem: Discharge Progression Outcomes Goal: Tolerating diet Patient remained hemodynamically stable without any acute event overnight. He denied pain, N&V or discomfort. Limited left arm movement was maintained overnight. Pacemaker has old blood drainage and was free of hematoma, new bleeding or sign and symptoms of infection. Patient remained afib on the monitor with occasional VPaced. Patient's bed was kept low, bathroom was offered with rounding and needed items kept within patient's reach. Will continue to monitor.

## 2014-07-03 NOTE — Discharge Summary (Signed)
Physician Discharge Summary  Patient ID: Derrick Cochran MRN: [id number] DOB/AGE: 1935-07-29 79 y.o.  Admit date: 07/02/2014 Discharge date: 07/03/2014  Primary Discharge Diagnosis sick sinus syndrome Secondary Discharge Diagnosis atrial fibrillation  Significant Diagnostic Studies:   Consults: None  Hospital Course: The patient underwent elective dual-chamber pacemaker implantation on 07/02/14. No complications. Patient was admitted to to a telemetry where her on sotalol 80 mg twice a day for rate and rhythm control of atrial fibrillation. Patient was continued on metoprolol succinate at 50 mg daily. Patient had an unremarkable hospital course with the exception of an rash to Tegaderm. This remained asymptomatic without chest pain or shortness of breath. Telemetry revealed pacing with underlying rhythm of atrial fibrillation with a controlled ventricular rate from 60-70 bpm. The patient was given 3 doses of sotalol prior to discharge, plan for 07/04/14. He is scheduled to see me in follow-up on 07/16/14.   Discharge Exam: Blood pressure 122/40, pulse 47, temperature 99.8 F (37.7 C), temperature source Oral, resp. rate 18, height 5\' 11"  (1.803 m), weight 85.911 kg (189 lb 6.4 oz), SpO2 95 %.  General appearance: alert Head: Normocephalic, without obvious abnormality Eyes: conjunctivae/corneas clear. PERRL, EOM's intact. Fundi benign. Ears: normal TM's and external ear canals both ears Nose: Nares normal. Septum midline. Mucosa normal. No drainage or sinus tenderness. Throat: lips, mucosa, and tongue normal; teeth and gums normal Neck: no adenopathy, no carotid bruit, no JVD, supple, symmetrical, trachea midline and thyroid not enlarged, symmetric, no tenderness/mass/nodules Resp: clear to auscultation bilaterally Chest wall: no tenderness Cardio: irregularly irregular rhythm GI: soft, non-tender; bowel sounds normal; no masses,  no organomegaly Extremities: extremities normal,  atraumatic, no cyanosis or edema Pulses: 2+ and symmetric Labs:   Lab Results  Component Value Date   WBC 6.4 06/29/2014   HGB 11.9* 06/29/2014   HCT 35.3* 06/29/2014   MCV 96.5 06/29/2014   PLT 84* 06/29/2014    Recent Labs Lab 06/29/14 1418  NA 142  K 4.2  CL 106  CO2 28  BUN 28*  CREATININE 1.37*  CALCIUM 8.9  GLUCOSE 125*      Radiology: Chest x-ray did not reveal evidence for pneumothorax EKG: Intermittent ventricular pacing with underlying atrial fibrillation with controlled ventricular rate  FOLLOW UP PLANS AND APPOINTMENTS    Medication List    STOP taking these medications        clopidogrel 75 MG tablet  Commonly known as:  PLAVIX     metoprolol tartrate 25 MG tablet  Commonly known as:  LOPRESSOR      TAKE these medications        cephALEXin 250 MG capsule  Commonly known as:  KEFLEX  Take 1 capsule (250 mg total) by mouth 4 (four) times daily.     cephALEXin 250 MG capsule  Commonly known as:  KEFLEX  Take 1 capsule (250 mg total) by mouth 4 (four) times daily.     enalapril 5 MG tablet  Commonly known as:  VASOTEC  Take 5 mg by mouth daily.     furosemide 20 MG tablet  Commonly known as:  LASIX  Take 20 mg by mouth daily as needed for edema.     gabapentin 300 MG capsule  Commonly known as:  NEURONTIN  Take 300 mg by mouth at bedtime.     glipiZIDE 5 MG tablet  Commonly known as:  GLUCOTROL  Take 5 mg by mouth daily.     levothyroxine 137 MCG tablet  Commonly known as:  SYNTHROID, LEVOTHROID  Take 137 mcg by mouth daily.     metFORMIN 500 MG tablet  Commonly known as:  GLUCOPHAGE  Take 500 mg by mouth 2 (two) times daily.     metoprolol succinate 50 MG 24 hr tablet  Commonly known as:  TOPROL-XL  Take 1 tablet (50 mg total) by mouth daily. Take with or immediately following a meal.     metoprolol succinate 50 MG 24 hr tablet  Commonly known as:  TOPROL XL  Take 1 tablet (50 mg total) by mouth daily. Take with or  immediately following a meal.     omeprazole 20 MG capsule  Commonly known as:  PRILOSEC  Take 20 mg by mouth daily.     simvastatin 40 MG tablet  Commonly known as:  ZOCOR  Take 40 mg by mouth daily.     sotalol 80 MG tablet  Commonly known as:  BETAPACE  Take 1 tablet (80 mg total) by mouth every 12 (twelve) hours.     sotalol 80 MG tablet  Commonly known as:  BETAPACE  Take 1 tablet (80 mg total) by mouth 2 (two) times daily.     tamsulosin 0.4 MG Caps capsule  Commonly known as:  FLOMAX  Take 0.4 mg by mouth daily.     temazepam 15 MG capsule  Commonly known as:  RESTORIL  Take 15 mg by mouth at bedtime as needed for sleep.     warfarin 3 MG tablet  Commonly known as:  COUMADIN  Take 3 mg by mouth daily. Pt takes with 1mg  tablet for a total dose of 4mg .     warfarin 1 MG tablet  Commonly known as:  COUMADIN  Take 1 mg by mouth daily. Pt takes with 3mg  tablet for a total dose of 4mg .         BRING ALL MEDICATIONS WITH YOU TO FOLLOW UP APPOINTMENTS  Time spent with patient to include physician time: 30 minutes Signed:  [name] MD, PhD, [facility] 07/03/2014, 12:57 PM

## 2014-07-03 NOTE — Progress Notes (Signed)
[facility] Cardiology  SUBJECTIVE: I feel good   Filed Vitals:   07/02/14 1548 07/02/14 2210 07/03/14 0300 07/03/14 0424  BP:  138/61 109/56 109/56  Pulse:   82 47  Temp:   99.7 F (37.6 C) 99.8 F (37.7 C)  TempSrc:   Oral Oral  Resp:   18 18  Height: 5\' 11"  (1.803 m)     Weight: 80.831 kg (178 lb 3.2 oz)  85.911 kg (189 lb 6.4 oz)   SpO2:   96% 95%     Intake/Output Summary (Last 24 hours) at 07/03/14 0820 Last data filed at 07/03/14 0442  Gross per 24 hour  Intake   1240 ml  Output    950 ml  Net    290 ml      PHYSICAL EXAM  General: Well developed, well nourished, in no acute distress HEENT:  Normocephalic and atramatic Neck:  No JVD.  Lungs: Clear bilaterally to auscultation and percussion. Heart: HRRR . Normal S1 and S2 without gallops or murmurs.  Abdomen: Bowel sounds are positive, abdomen soft and non-tender  Msk:  Back normal, normal gait. Normal strength and tone for age. Extremities: No clubbing, cyanosis or edema.   Neuro: Alert and oriented X 3. Psych:  Good affect, responds appropriately   LABS: Basic Metabolic Panel: No results for input(s): NA, K, CL, CO2, GLUCOSE, BUN, CREATININE, CALCIUM, MG, PHOS in the last 72 hours. Liver Function Tests: No results for input(s): AST, ALT, ALKPHOS, BILITOT, PROT, ALBUMIN in the last 72 hours. No results for input(s): LIPASE, AMYLASE in the last 72 hours. CBC: No results for input(s): WBC, NEUTROABS, HGB, HCT, MCV, PLT in the last 72 hours. Cardiac Enzymes: No results for input(s): CKTOTAL, CKMB, CKMBINDEX, TROPONINI in the last 72 hours. BNP: Invalid input(s): POCBNP D-Dimer: No results for input(s): DDIMER in the last 72 hours. Hemoglobin A1C: No results for input(s): HGBA1C in the last 72 hours. Fasting Lipid Panel: No results for input(s): CHOL, HDL, LDLCALC, TRIG, CHOLHDL, LDLDIRECT in the last 72 hours. Thyroid Function Tests: No results for input(s): TSH, T4TOTAL, T3FREE, THYROIDAB in the last 72  hours.  Invalid input(s): FREET3 Anemia Panel: No results for input(s): VITAMINB12, FOLATE, FERRITIN, TIBC, IRON, RETICCTPCT in the last 72 hours.  Dg Chest Port 1 View  07/02/2014   CLINICAL DATA:  Pacemaker insertion.  EXAM: PORTABLE CHEST - 1 VIEW; DG C-ARM 1-60 MIN - NRPT [facility]  COMPARISON:  06/29/2014  FINDINGS: Dual lead pacemaker has been inserted. Temporary pacing lead comes in from the right arm. Borderline cardiomegaly. CABG. Pulmonary vascularity is normal and the lungs are clear. No pneumothorax.  IMPRESSION: Satisfactory appearance of the chest after pacemaker insertion.   Electronically Signed   By: Lorriane Shire M.D.   On: 07/02/2014 17:01   Dg C-arm 1-60 Min-no Report  07/02/2014   CLINICAL DATA: pacemaker insertion   C-ARM 1-60 MINUTES  Fluoroscopy was utilized by the requesting physician.  No radiographic  interpretation.      Echo   TELEMETRY: Intermittent ventricular pacing with underlying atrial fibrillation:  ASSESSMENT AND PLAN:  Active Problems:   Atrial fibrillation with rapid ventricular response    1. Sick sinus syndrome, sinus bradycardia, status post dual-chamber pacemaker, pacemaker site appears clean and well healing 2. Atrial fibrillation with a rapid ventricular response, rate improved after the addition of sotalol 80 mg twice a day  Recommendations  1. Continue to monitor on telemetry 2. Continue sotalol 80 mg every 12 3. Patient continues  to do well clinically, may consider discharge later today 4. Patient remains in fibrillation, we'll likely repeat elective cardioversion in 1-2 weeks   Jessi Jessop, MD, PhD, [facility] 07/03/2014 8:20 AM

## 2014-07-04 NOTE — Progress Notes (Signed)
Derrick Cochran  SUBJECTIVE: Doing well status post permanent pacemaker. Telemetry reveals atrial fibrillation with intermittent pacing. Rate is controlled. No complications from pacemaker implant. Patient is ambulated without difficulty. Plan discharge today with follow-up with Dr. Ronalee Belts Vitals:   07/03/14 0424 07/03/14 0925 07/03/14 2004 07/04/14 0440  BP: 109/56 122/40 115/56 100/45  Pulse: 47  111 64  Temp: 99.8 F (37.7 C)  98.6 F (37 C) 98.5 F (36.9 C)  TempSrc: Oral  Oral Oral  Resp: 18  19 19   Height:      Weight:    85.9 kg (189 lb 6 oz)  SpO2: 95%  95% 94%    Intake/Output Summary (Last 24 hours) at 07/04/14 0857 Last data filed at 07/04/14 0541  Gross per 24 hour  Intake    360 ml  Output    900 ml  Net   -540 ml    LABS: Basic Metabolic Panel: No results for input(s): NA, K, CL, CO2, GLUCOSE, BUN, CREATININE, CALCIUM, MG, PHOS in the last 72 hours. Liver Function Tests: No results for input(s): AST, ALT, ALKPHOS, BILITOT, PROT, ALBUMIN in the last 72 hours. No results for input(s): LIPASE, AMYLASE in the last 72 hours. CBC: No results for input(s): WBC, NEUTROABS, HGB, HCT, MCV, PLT in the last 72 hours. Cardiac Enzymes: No results for input(s): CKTOTAL, CKMB, CKMBINDEX, TROPONINI in the last 72 hours. BNP: Invalid input(s): POCBNP D-Dimer: No results for input(s): DDIMER in the last 72 hours. Hemoglobin A1C: No results for input(s): HGBA1C in the last 72 hours. Fasting Lipid Panel: No results for input(s): CHOL, HDL, LDLCALC, TRIG, CHOLHDL, LDLDIRECT in the last 72 hours. Thyroid Function Tests: No results for input(s): TSH, T4TOTAL, T3FREE, THYROIDAB in the last 72 hours.  Invalid input(s): FREET3 Anemia Panel: No results for input(s): VITAMINB12, FOLATE, FERRITIN, TIBC, IRON, RETICCTPCT in the last 72 hours.   PHYSICAL EXAM General: Well developed, well nourished, in no acute distress HEENT:   Normocephalic and atramatic Neck:  No JVD. Pacemaker site intact with Steri-Strips in place. No ecchymosis, drainage or bleeding Lungs: Clear bilaterally to auscultation and percussion. Heart: Irregularly irregular with 1 to 2/6 systolic murmur radiating to the outflow tract. No diastolic murmur.  Abdomen: Bowel sounds are positive, abdomen soft and non-tender  Msk:  Back normal, normal gait. Normal strength and tone for age. Extremities: No clubbing, cyanosis or edema.   Neuro: Alert and oriented X 3. Psych:  Good affect, responds appropriately  TELEMETRY: Reviewed telemetry pt in atrial fibrillation with intermittent ventricular pacing:  ASSESSMENT AND PLAN:    Principal Problem:   Sick sinus syndrome Active Problems:   Atrial fibrillation with rapid ventricular response    #1 continue with current medications including sotalol at 80 mg twice daily, #2. Continue with previous home medications including Plavix, aspirin and warfarin #3. Follow-up in cardiology as planned #4 okay for discharge today. Will give sotalol prior to discharge. Start warfarin tonight  Teodoro Spray., MD, [facility] 07/04/2014 8:57 AM

## 2014-07-04 NOTE — Plan of Care (Signed)
Problem: Discharge Progression Outcomes Goal: Site without bleeding, hematoma, S/S infection Outcome: Not Applicable Date Met:  82/41/75 No bleeding noted.

## 2014-07-04 NOTE — Progress Notes (Signed)
Benadryl given for itching at pacemaker insertion site. New steri-strips applied to pacemaker insertion sited after noting that he was complaining of itching and removed steri-strips. Telemetry reading atrial fib with pacer spike noted. No drainage noted from surgical site.

## 2014-07-04 NOTE — Plan of Care (Signed)
Problem: Consults Goal: Permanent Pacemaker Patient Education (See Patient Education module for education specifics.)  Outcome: Progressing Educated on surgical site left chest pacemaker insertion site. Scratching at site and steri-strips came off. Informed to ask for benadryl if itching and not to scratch site.

## 2014-07-04 NOTE — Plan of Care (Signed)
Problem: Discharge Progression Outcomes Goal: Site without bleeding, hematoma, S/S infection Outcome: Not Progressing Steri-strips intact without signs of bleeding or infection.

## 2014-07-16 DIAGNOSIS — Z79899 Other long term (current) drug therapy: Secondary | ICD-10-CM | POA: Diagnosis not present

## 2014-07-16 DIAGNOSIS — I10 Essential (primary) hypertension: Secondary | ICD-10-CM | POA: Diagnosis not present

## 2014-07-16 DIAGNOSIS — I482 Chronic atrial fibrillation: Secondary | ICD-10-CM | POA: Diagnosis not present

## 2014-07-16 DIAGNOSIS — I2119 ST elevation (STEMI) myocardial infarction involving other coronary artery of inferior wall: Secondary | ICD-10-CM | POA: Diagnosis not present

## 2014-07-16 DIAGNOSIS — I350 Nonrheumatic aortic (valve) stenosis: Secondary | ICD-10-CM | POA: Diagnosis not present

## 2014-08-04 NOTE — H&P (Signed)
Progress Notes   Derrick Cochran (MR# [id number])        Progress Notes Info     Author Note Status Last Update User Last Update Date/Time Service Date   [name], MD Signed [name], MD Wed Jun 03, 2014 9:53 AM Wed Jun 03, 2014 9:43 AM    Progress Notes    Expand All Collapse All   Established Patient Visit   Chief Complaint: Chief Complaint  Patient presents with  . Follow-up    Discuss a pacemaker    Date of Service: 06/03/2014 Date of Birth: Jul 02, 1935 PCP: [name], MD, MD  History of Present Illness: Derrick Cochran is a 79 y.o.male patient who returns for coronary artery disease, persistent atrial fibrillation, aortic stenosis status post TAVR, hypertension and hyperlipidemia. The patient is status post CABG x4 11/10/1993. Cardiac catheterization 12/04/2013 revealed occluded LAD, occluded left circumflex, 80% stenosis RCA, patent LIMA to LAD, patent SVG to D 1, patent SVG to OM1 and 60% stenosis SVG to PDA. The patient denies chest pain. He has mild exertional dyspnea. He denies palpitations or heart racing. He denies peripheral edema. The patient has not experienced presyncope or syncope. The patient is not active and does not exercise regularly.  The patient underwent TAVR on 04/01/2014 at [facility]. The patient started cardiac rehabilitation but experienced progressive generalized weakness and shortness of breath. He went to the emergency room on 04/14/2014 at which time he was noted to be in atrial fibrillation with a controlled ventricular rate at 68 beats per minute. 24 hour Holter monitor revealed persistent atrial fibrillation with a mean heart rate of 65 beats per minute. The patient has a chads Vasc score 5 and currently is on chronic warfarin therapy. The patient was seen in follow-up at [facility] status post TAVR. Echocardiogram was performed on 05/07/2014 which revealed normal left ventricular  function, LV ejection fraction of 55-60% with stable appearing aortic valve prosthesis with a mean gradient of 14.5 mm Hg. Thirty day loop monitor was performed however results are unavailable at time of this dictation. The patient was told that he would require a permanent pacemaker based on loop monitor results. EKG today reveals atrial fibrillation at a rate of 81 beats per minute.  Patient has essential hypertension, blood pressure today under excellent control on current BP medications including enalapril and metoprolol, which are tolerated well without apparent side effects.  The patient has hyperlipidemia, currently on simvastatin, which is tolerated well without apparent side-effects.  The patient has type 2 diabetes, currently on oral agents, including glipizide and metformin, which is tolerated well, followed by his primary care provider.  Past Medical and Surgical History  Past Medical History Past Medical History  Diagnosis Date  . Inferior myocardial infarction 1994  . Diabetes mellitus type 2, uncomplicated   . Hypothyroidism   . Hypertension   . BPH (benign prostatic hypertrophy)   . Former smoker   . Hyperlipidemia   . Abdominal aneurysm   . Rash     - annularis  . GERD (gastroesophageal reflux disease)   . DDD (degenerative disc disease)     w/ back pain  . Esophageal stricture   . Lone atrial fibrillation 7/98  . Chronic atrial fibrillation since 5/09   . Aortic stenosis, mild   . Rotator cuff syndrome   . Hx of CABG 12/25/2013    1. CABG x 4 performed 11/10/1993 with Dr. Rosana Hoes here at [facility]  Past Surgical History He has past surgical history that includes Quadruple coronary artery bypass (11/10/1993); Hernia repair (Left); Appendectomy; Foot surgery for nerve impingement (Left); PTCA of RCA (5/94 and 9/94); Coronary artery bypass graft; Cardiac catheterization; colonoscopy (08/02/2010); egd  (11/07/2012); and Colonoscopy (12/18/2013).   Medications and Allergies  Current Medications   Current Medications    Current Outpatient Prescriptions  Medication Sig Dispense Refill  . ciprofloxacin HCl (CIPRO) 250 MG tablet     . clopidogrel (PLAVIX) 75 mg tablet Take 75 mg by mouth once daily.    . enalapril (VASOTEC) 5 MG tablet Take 1 tablet (5 mg total) by mouth 2 (two) times daily. 180 tablet 3  . FUROsemide (LASIX) 20 MG tablet Take 1 tablet (20 mg total) by mouth once daily. 30 tablet 11  . gabapentin (NEURONTIN) 300 MG capsule Take 1 capsule (300 mg total) by mouth nightly. 90 capsule 3  . glipiZIDE (GLUCOTROL XL) 5 MG XL tablet Take 1 tablet (5 mg total) by mouth once daily. 90 tablet 3  . HYDROcodone-acetaminophen (NORCO) 5-325 mg tablet     . levothyroxine (SYNTHROID, LEVOTHROID) 137 MCG tablet Take 137 mcg by mouth once daily. 90 tablet 3  . metFORMIN (GLUCOPHAGE) 500 MG tablet Take 1 tablet (500 mg total) by mouth 2 (two) times daily with meals. 180 tablet 3  . metoprolol succinate (TOPROL-XL) 25 MG XL tablet Take 1 tablet (25 mg total) by mouth once daily. 90 tablet 3  . omeprazole (PRILOSEC) 20 MG DR capsule Take 1 capsule (20 mg total) by mouth once daily. 90 capsule 3  . simvastatin (ZOCOR) 40 MG tablet Take 1 tablet (40 mg total) by mouth nightly. 90 tablet 3  . tamsulosin (FLOMAX) 0.4 mg capsule Take 1 capsule (0.4 mg total) by mouth once daily. Take 30 minutes after same meal each day. 90 capsule 3  . temazepam (RESTORIL) 15 mg capsule TAKE ONE CAPSULE BY MOUTH AT BEDTIME FOR SLEEP 30 capsule 4  . warfarin (COUMADIN) 1 MG tablet TAKE 1 AND 1/2 TABLETS BY MOUTH DAILY, TAKE WITH 3 MG FOR TOTAL DAILY DOSE OF 4.5 MG. 135 tablet 2  . warfarin (COUMADIN) 3 MG tablet Take 1 tablet (3 mg total) by mouth once daily. 90 tablet 3   No current facility-administered medications for this  visit.      Allergies: Sulfa (sulfonamide antibiotics) and Sulfamethoxazole  Social and Family History  Social History  reports that he quit smoking about 28 years ago. He has never used smokeless tobacco. He reports that he does not drink alcohol or use illicit drugs.  Family History Family History  Problem Relation Age of Onset  . Lung cancer Father   . Heart attack Father   . Coronary artery disease Sister 63    s/p angioplasty  . Colon cancer Neg Hx   . Colon polyps Neg Hx   . Liver disease Neg Hx   . Rectal cancer Neg Hx   . Ulcers Neg Hx   . Heart attack Maternal Grandfather     Review of Systems   Review of Systems: The patient denies chest pain, reports chronic exertional shortness of breath, without orthopnea, paroxysmal nocturnal dyspnea, pedal edema, palpitations, heart racing, presyncope, syncope. Review of 12 Systems is negative except as described above.  Physical Examination   Vitals:BP 128/72 mmHg  Pulse 60  Ht 182.9 cm (6')  Wt 84.097 kg (185 lb 6.4 oz)  BMI 25.14 kg/m2 Ht:182.9 cm (6') Wt:84.097 kg (185 lb  6.4 oz) YHC:WCBJ surface area is 2.07 meters squared. Body mass index is 25.14 kg/(m^2).  HEENT: Pupils equally reactive to light and accomodation Neck: Supple without thyromegaly, carotid pulses 2+ Lungs: clear to auscultation bilaterally; no wheezes, rales, rhonchi Heart: Irregular irregular rhythm. No gallops, murmurs or rub Abdomen: soft nontender, nondistended, with normal bowel sounds Extremities: no cyanosis, clubbing, or edema Peripheral Pulses: 2+ in all extremities, 2+ femoral pulses bilaterally  Assessment   79 y.o. male with  1. Chronic atrial fibrillation   2. Aortic stenosis, mild   3. Aortic stenosis, severe   4. Essential hypertension with goal blood pressure less than 140/90   5. Lone atrial fibrillation   6. Non-ST elevation (NSTEMI) myocardial  infarction   7. Diabetes mellitus type 2, uncomplicated   8. Ischemic chest pain   9. H/O cardiac catheterization   10. Hx of CABG   11. Pure hypercholesterolemia   12. S/p TAVR (transcatheter aortic valve replacement), bioprosthetic   13. Bradycardia    79 year old gentleman with known CAD, status post CABG, currently without chest pain. The patient is status post TAVR with recent echocardiogram revealing normal left ventricular function and stable appearing aortic valve prosthesis. The patient has persistent atrial fibrillation, chads Vasc score 5, currently on warfarin, experiencing exertional dyspnea and generalized fatigue. The patient had recent follow-up [facility] which time he was instructed that he may require permanent pacemaker based on loop monitor results. The patient has essential hypertension, blood pressure under good control on current medications. Patient has hyperlipidemia, currently on simvastatin which is well tolerated. He has type 2 diabetes, currently on oral agents, followed by his primary care provider.    Plan   1. Continue current medications 2. Continue warfarin for stroke prevention 3. Obtain a loop monitor results 4. Counseled patient about low-sodium diet 5. DASH diet printed instructions given to the patient  6. Counseled patient about low-cholesterol diet 7. Continue simvastatin for hyperlipidemia management 8. Low-fat and cholesterol diet printed instructions given to the patient 9. Diabetes diet printed instructions given to the patient 10. Proceed with elective cardioversion. If successful then will start sotalol 80 mg b.i.d. for maintenance of sinus rhythm. 11. Return to clinic after elective cardioversion  Orders Placed This Encounter  Procedures  . ECG 12-lead    Return in about 3 weeks (around 06/24/2014).  [name], [facility]    [address] [place] [address]    Service Location    Name Address       [place] [address]      Department    Name Address Phone Fax   [facility] [place] [address] [phone]

## 2014-08-06 ENCOUNTER — Encounter: Payer: Self-pay | Admitting: *Deleted

## 2014-08-06 ENCOUNTER — Encounter
Admission: RE | Disposition: A | Discharge status: Home / Self Care | Payer: Self-pay | Source: Ambulatory Visit | Attending: Cardiology

## 2014-08-06 ENCOUNTER — Ambulatory Visit
Admission: RE | Admit: 2014-08-06 | Discharge: 2014-08-06 | Disposition: A | Discharge status: Home / Self Care | Payer: Commercial Managed Care - HMO | Source: Ambulatory Visit | Attending: Cardiology | Admitting: Cardiology

## 2014-08-06 ENCOUNTER — Encounter: Payer: Commercial Managed Care - HMO | Admitting: Anesthesiology

## 2014-08-06 ENCOUNTER — Encounter: Payer: Self-pay | Admitting: Anesthesiology

## 2014-08-06 DIAGNOSIS — Z7901 Long term (current) use of anticoagulants: Secondary | ICD-10-CM | POA: Insufficient documentation

## 2014-08-06 DIAGNOSIS — I480 Paroxysmal atrial fibrillation: Secondary | ICD-10-CM | POA: Diagnosis not present

## 2014-08-06 DIAGNOSIS — I252 Old myocardial infarction: Secondary | ICD-10-CM | POA: Insufficient documentation

## 2014-08-06 DIAGNOSIS — I482 Chronic atrial fibrillation, unspecified: Secondary | ICD-10-CM

## 2014-08-06 DIAGNOSIS — I10 Essential (primary) hypertension: Secondary | ICD-10-CM | POA: Diagnosis not present

## 2014-08-06 DIAGNOSIS — Z951 Presence of aortocoronary bypass graft: Secondary | ICD-10-CM | POA: Diagnosis not present

## 2014-08-06 DIAGNOSIS — I4891 Unspecified atrial fibrillation: Secondary | ICD-10-CM | POA: Diagnosis present

## 2014-08-06 DIAGNOSIS — E780 Pure hypercholesterolemia: Secondary | ICD-10-CM | POA: Diagnosis not present

## 2014-08-06 DIAGNOSIS — I2510 Atherosclerotic heart disease of native coronary artery without angina pectoris: Secondary | ICD-10-CM | POA: Diagnosis not present

## 2014-08-06 DIAGNOSIS — Z952 Presence of prosthetic heart valve: Secondary | ICD-10-CM | POA: Diagnosis not present

## 2014-08-06 DIAGNOSIS — E119 Type 2 diabetes mellitus without complications: Secondary | ICD-10-CM | POA: Diagnosis not present

## 2014-08-06 DIAGNOSIS — I350 Nonrheumatic aortic (valve) stenosis: Secondary | ICD-10-CM | POA: Insufficient documentation

## 2014-08-06 HISTORY — PX: ELECTROPHYSIOLOGIC STUDY: SHX172A

## 2014-08-06 LAB — PROTIME-INR
INR: 2.53
PROTHROMBIN TIME: 27.4 s — AB (ref 11.4–15.0)

## 2014-08-06 SURGERY — CARDIOVERSION (CATH LAB)
Anesthesia: General

## 2014-08-06 MED ORDER — PROPOFOL 10 MG/ML IV BOLUS
INTRAVENOUS | Status: DC | PRN
  Administered 2014-08-06: 80 mg via INTRAVENOUS

## 2014-08-06 MED ORDER — SODIUM CHLORIDE 0.9 % IV SOLN
INTRAVENOUS | Status: DC
  Administered 2014-08-06: 08:00:00 via INTRAVENOUS

## 2014-08-06 NOTE — Op Note (Signed)
[facility] Cardiology   08/06/2014                     7:59 AM  PATIENT:  Derrick Cochran    PRE-OPERATIVE DIAGNOSIS:  atrial fib  POST-OPERATIVE DIAGNOSIS:  Same  PROCEDURE:  Cardioversion  SURGEON:  [name], MD    ANESTHESIA:     PREOPERATIVE INDICATIONS:  Derrick Cochran is a  79 y.o. male with a diagnosis of atrial fib who failed conservative measures and elected for surgical management.    The risks benefits and alternatives were discussed with the patient preoperatively including but not limited to the risks of infection, bleeding, cardiopulmonary complications, the need for revision surgery, among others, and the patient was willing to proceed.   OPERATIVE PROCEDURE: The patient underwent elective cardioversion. He received 3 shocks 50, 100, and 200 J with conversion to sinus rhythm. There were no complications.

## 2014-08-06 NOTE — Anesthesia Preprocedure Evaluation (Addendum)
Anesthesia Evaluation  Patient identified by MRN, date of birth, ID band Patient awake    Reviewed: Allergy & Precautions, NPO status , reviewed documented beta blocker date and time   History of Anesthesia Complications Negative for: history of anesthetic complications  Airway Mallampati: II       Dental no notable dental hx. (+) Teeth Intact   Pulmonary shortness of breath, former smoker,    Pulmonary exam normal       Cardiovascular hypertension, Pt. on medications + CAD, + Past MI and + DOE Rhythm:Regular Rate:Normal     Neuro/Psych negative neurological ROS  negative psych ROS   GI/Hepatic Neg liver ROS, GERD-  ,  Endo/Other  diabetes, Type 2, Oral Hypoglycemic AgentsHypothyroidism   Renal/GU negative Renal ROS  negative genitourinary   Musculoskeletal  (+) Arthritis -, Osteoarthritis,    Abdominal Normal abdominal exam  (+)   Peds negative pediatric ROS (+)  Hematology negative hematology ROS (+)   Anesthesia Other Findings   Reproductive/Obstetrics                           Anesthesia Physical Anesthesia Plan  ASA: III  Anesthesia Plan: General   Post-op Pain Management:    Induction: Intravenous  Airway Management Planned: Nasal Cannula  Additional Equipment:   Intra-op Plan:   Post-operative Plan:   Informed Consent: I have reviewed the patients History and Physical, chart, labs and discussed the procedure including the risks, benefits and alternatives for the proposed anesthesia with the patient or authorized representative who has indicated his/her understanding and acceptance.     Plan Discussed with: Anesthesiologist and CRNA  Anesthesia Plan Comments: (Cardioversion with Dr. Saralyn Pilar. Plan propofol injection. Emergency drugs available.)        Anesthesia Quick Evaluation                                  Anesthesia Evaluation  Patient identified by  MRN, date of birth, ID band Patient awake    Reviewed: Allergy & Precautions, NPO status   History of Anesthesia Complications Negative for: history of anesthetic complications  Airway Mallampati: II       Dental no notable dental hx. (+) Teeth Intact   Pulmonary shortness of breath, former smoker,    Pulmonary exam normal       Cardiovascular hypertension, Pt. on medications + CAD, + Past MI and + DOE Rhythm:Regular Rate:Normal     Neuro/Psych negative neurological ROS  negative psych ROS   GI/Hepatic Neg liver ROS, GERD-  ,  Endo/Other  diabetes, Type 2, Oral Hypoglycemic AgentsHypothyroidism   Renal/GU negative Renal ROS  negative genitourinary   Musculoskeletal  (+) Arthritis -, Osteoarthritis,    Abdominal Normal abdominal exam  (+)   Peds negative pediatric ROS (+)  Hematology negative hematology ROS (+)   Anesthesia Other Findings   Reproductive/Obstetrics                            Anesthesia Physical Anesthesia Plan  ASA: III  Anesthesia Plan: General   Post-op Pain Management:    Induction: Intravenous  Airway Management Planned: Nasal Cannula  Additional Equipment:   Intra-op Plan:   Post-operative Plan:   Informed Consent: I have reviewed the patients History and Physical, chart, labs and discussed the procedure including the risks, benefits and alternatives  for the proposed anesthesia with the patient or authorized representative who has indicated his/her understanding and acceptance.     Plan Discussed with: CRNA and Surgeon  Anesthesia Plan Comments:        Anesthesia Quick Evaluation

## 2014-08-06 NOTE — Discharge Instructions (Signed)
Cardioversin elctrica, cuidados posteriores Magazine features editor, Care After) Siga estas instrucciones durante las prximas semanas. Estas indicaciones le proporcionan informacin general acerca de cmo deber cuidarse despus del procedimiento. El mdico tambin podr darle instrucciones ms especficas. El tratamiento ha sido planificado segn las prcticas mdicas actuales, pero en algunos casos pueden ocurrir problemas. Comunquese con el mdico si tiene algn problema o tiene dudas despus del procedimiento. QU ESPERAR DESPUS DEL PROCEDIMIENTO Despus del procedimiento, es normal tener:  Cierto enrojecimiento en la piel en el lugar en que se enviaron las descargas. Si siente dolor, podr aplicarse una locin para las quemaduras de sol o hidrocortisona.  Posible retorno de un ritmo cardaco anormal luego de algunas horas o das despus del procedimiento. [place] los medicamentos solamente como se lo haya indicado el mdico. Asegrese de que comprende cmo y cundo puede tomar sus medicamentos.  Aprenda cmo sentir su pulso y a controlarlo.  Limite su actividad durante 48horas despus del procedimiento o segn las indicaciones del mdico.  Evite o minimice la cafena y otros estimulantes segn las indicaciones del mdico. SOLICITE ATENCIN MDICA SI:  Siente que el corazn late [place] rpido o el pulso no es regular.  Tiene dudas relacionadas con los medicamentos.  Tiene una hemorragia que no se detiene. SOLICITE ATENCIN MDICA DE INMEDIATO SI:  Se siente mareado o sufre un desmayo.  Le cuesta trabajo respirar o siente que le falta el aire.  Hay un cambio en las [employer].  Arrastra las palabras al hablar o tiene dificultad para mover un brazo o una pierna de un lado del cuerpo.  Tiene un intenso calambre muscular que no se calma.  Sus dedos o la mano estn fros o de Optician, dispensing. Document Released: 11/20/2012  Document Revised: 06/16/2013 [facility] Patient Information 2015 [place]. This information is not intended to replace advice given to you by your health care provider. Make sure you discuss any questions you have with your health care provider.  Atrial Fibrillation Atrial fibrillation is a condition that causes your heart to beat irregularly. It may also cause your heart to beat faster than normal. Atrial fibrillation can prevent your heart from pumping blood normally. It increases your risk of stroke and heart problems. HOME CARE  Take medications as told by your doctor.  Only take medications that your doctor says are safe. Some medications can make the condition worse or happen again.  If blood thinners were prescribed by your doctor, take them exactly as told. Too much can cause bleeding. Too little and you will not have the needed protection against stroke and other problems.  Perform blood tests at home if told by your doctor.  Perform blood tests exactly as told by your doctor.  Do not drink alcohol.  Do not drink beverages with caffeine such as coffee, soda, and some teas.  Maintain a healthy weight.  Do not use diet pills unless your doctor says they are safe. They may make heart problems worse.  Follow diet instructions as told by your doctor.  Exercise regularly as told by your doctor.  Keep all follow-up appointments. GET HELP IF:  You notice a change in the speed, rhythm, or strength of your heartbeat.  You suddenly begin peeing (urinating) more often.  You get tired more easily when moving or exercising. GET HELP RIGHT AWAY IF:   You have chest or belly (abdominal) pain.  You feel sick to your stomach (nauseous).  You are short of breath.  You suddenly have swollen feet and ankles.  You feel dizzy.  You face, arms, or legs feel numb or weak.  There is a change in your vision or speech. MAKE SURE YOU:   Understand these instructions.  Will  watch your condition.  Will get help right away if you are not doing well or get worse. Document Released: 11/09/2007 Document Revised: 06/16/2013 Document Reviewed: 03/12/2012 [facility] Patient Information 2015 [place], [place]. This information is not intended to replace advice given to you by your health care provider. Make sure you discuss any questions you have with your health care provider. Electrical Cardioversion, Care After Refer to this sheet in the next few weeks. These instructions provide you with information on caring for yourself after your procedure. Your health care provider may also give you more specific instructions. Your treatment has been planned according to current medical practices, but problems sometimes occur. Call your health care provider if you have any problems or questions after your procedure. WHAT TO EXPECT AFTER THE PROCEDURE After your procedure, it is typical to have the following sensations:  Some redness on the skin where the shocks were delivered. If this is tender, a sunburn lotion or hydrocortisone cream may help.  Possible return of an abnormal heart rhythm within hours or days after the procedure. HOME CARE INSTRUCTIONS  Take medicines only as directed by your health care provider. Be sure you understand how and when to take your medicine.  Learn how to feel your pulse and check it often.  Limit your activity for 48 hours after the procedure or as directed by your health care provider.  Avoid or minimize caffeine and other stimulants as directed by your health care provider. SEEK MEDICAL CARE IF:  You feel like your heart is beating too fast or your pulse is not regular.  You have any questions about your medicines.  You have bleeding that will not stop. SEEK IMMEDIATE MEDICAL CARE IF:  You are dizzy or feel faint.  It is hard to breathe or you feel short of breath.  There is a change in discomfort in your chest.  Your speech is slurred  or you have trouble moving an arm or leg on one side of your body.  You get a serious muscle cramp that does not go away.  Your fingers or toes turn cold or blue. Document Released: 11/20/2012 Document Revised: 06/16/2013 Document Reviewed: 11/20/2012 [facility] Patient Information 2015 [place], [place]. This information is not intended to replace advice given to you by your health care provider. Make sure you discuss any questions you have with your health care provider.

## 2014-08-06 NOTE — Transfer of Care (Signed)
Immediate Anesthesia Transfer of Care Note  Patient: Derrick Cochran  Procedure(s) Performed: Procedure(s): Cardioversion (N/A)  Patient Location: PACU and Cath Lab  Anesthesia Type:General  Level of Consciousness: awake, alert  and oriented  Airway & Oxygen Therapy: Patient Spontanous Breathing and Patient connected to nasal cannula oxygen  Post-op Assessment: Report given to RN and Post -op Vital signs reviewed and stable  Post vital signs: Reviewed and stable  Last Vitals:  Filed Vitals:   08/06/14 0809  BP: 107/53  Pulse: 59  Temp: 37 C  Resp: 21    Complications: No apparent anesthesia complications

## 2014-08-06 NOTE — Anesthesia Postprocedure Evaluation (Signed)
  Anesthesia Post-op Note  Patient: Derrick Cochran  Procedure(s) Performed: Procedure(s): Cardioversion (N/A)  Anesthesia type:General  Patient location: PACU  Post pain: Pain level controlled  Post assessment: Post-op Vital signs reviewed, Patient's Cardiovascular Status Stable, Respiratory Function Stable, Patent Airway and No signs of Nausea or vomiting  Post vital signs: Reviewed and stable  Last Vitals:  Filed Vitals:   08/06/14 0809  BP: 107/53  Pulse: 59  Temp: 37 C  Resp: 21    Level of consciousness: awake, alert  and patient cooperative  Complications: No apparent anesthesia complications

## 2014-08-13 DIAGNOSIS — I10 Essential (primary) hypertension: Secondary | ICD-10-CM | POA: Diagnosis not present

## 2014-08-13 DIAGNOSIS — I482 Chronic atrial fibrillation: Secondary | ICD-10-CM | POA: Diagnosis not present

## 2014-08-13 DIAGNOSIS — I350 Nonrheumatic aortic (valve) stenosis: Secondary | ICD-10-CM | POA: Diagnosis not present

## 2014-08-13 DIAGNOSIS — I714 Abdominal aortic aneurysm, without rupture: Secondary | ICD-10-CM | POA: Diagnosis not present

## 2014-08-14 LAB — GLUCOSE, CAPILLARY: Glucose-Capillary: 110 mg/dL — ABNORMAL HIGH (ref 65–99)

## 2014-09-04 ENCOUNTER — Encounter: Payer: Self-pay | Admitting: Cardiology

## 2014-09-30 DIAGNOSIS — I482 Chronic atrial fibrillation: Secondary | ICD-10-CM | POA: Diagnosis not present

## 2014-10-08 DIAGNOSIS — E039 Hypothyroidism, unspecified: Secondary | ICD-10-CM | POA: Diagnosis not present

## 2014-10-08 DIAGNOSIS — I10 Essential (primary) hypertension: Secondary | ICD-10-CM | POA: Diagnosis not present

## 2014-10-08 DIAGNOSIS — E119 Type 2 diabetes mellitus without complications: Secondary | ICD-10-CM | POA: Diagnosis not present

## 2014-10-08 DIAGNOSIS — E780 Pure hypercholesterolemia: Secondary | ICD-10-CM | POA: Diagnosis not present

## 2014-10-14 DIAGNOSIS — I6523 Occlusion and stenosis of bilateral carotid arteries: Secondary | ICD-10-CM | POA: Diagnosis not present

## 2014-10-14 DIAGNOSIS — R42 Dizziness and giddiness: Secondary | ICD-10-CM | POA: Diagnosis not present

## 2014-10-23 ENCOUNTER — Encounter: Payer: Self-pay | Admitting: Cardiology

## 2014-10-26 DIAGNOSIS — M7581 Other shoulder lesions, right shoulder: Secondary | ICD-10-CM | POA: Diagnosis not present

## 2014-10-26 DIAGNOSIS — M25511 Pain in right shoulder: Secondary | ICD-10-CM | POA: Diagnosis not present

## 2014-10-27 DIAGNOSIS — I482 Chronic atrial fibrillation: Secondary | ICD-10-CM | POA: Diagnosis not present

## 2014-10-27 DIAGNOSIS — Z79899 Other long term (current) drug therapy: Secondary | ICD-10-CM | POA: Diagnosis not present

## 2014-10-27 DIAGNOSIS — I10 Essential (primary) hypertension: Secondary | ICD-10-CM | POA: Diagnosis not present

## 2014-10-27 DIAGNOSIS — E039 Hypothyroidism, unspecified: Secondary | ICD-10-CM | POA: Diagnosis not present

## 2014-10-27 DIAGNOSIS — E780 Pure hypercholesterolemia: Secondary | ICD-10-CM | POA: Diagnosis not present

## 2014-10-27 DIAGNOSIS — E119 Type 2 diabetes mellitus without complications: Secondary | ICD-10-CM | POA: Diagnosis not present

## 2014-10-27 DIAGNOSIS — Z125 Encounter for screening for malignant neoplasm of prostate: Secondary | ICD-10-CM | POA: Diagnosis not present

## 2014-11-13 DIAGNOSIS — I714 Abdominal aortic aneurysm, without rupture: Secondary | ICD-10-CM | POA: Diagnosis not present

## 2014-11-13 DIAGNOSIS — I209 Angina pectoris, unspecified: Secondary | ICD-10-CM | POA: Diagnosis not present

## 2014-11-13 DIAGNOSIS — I482 Chronic atrial fibrillation: Secondary | ICD-10-CM | POA: Diagnosis not present

## 2014-11-13 DIAGNOSIS — I2119 ST elevation (STEMI) myocardial infarction involving other coronary artery of inferior wall: Secondary | ICD-10-CM | POA: Diagnosis not present

## 2014-11-13 DIAGNOSIS — I214 Non-ST elevation (NSTEMI) myocardial infarction: Secondary | ICD-10-CM | POA: Diagnosis not present

## 2014-11-13 DIAGNOSIS — I350 Nonrheumatic aortic (valve) stenosis: Secondary | ICD-10-CM | POA: Diagnosis not present

## 2014-11-13 DIAGNOSIS — I495 Sick sinus syndrome: Secondary | ICD-10-CM | POA: Diagnosis not present

## 2014-11-13 DIAGNOSIS — I10 Essential (primary) hypertension: Secondary | ICD-10-CM | POA: Diagnosis not present

## 2014-11-24 DIAGNOSIS — I482 Chronic atrial fibrillation: Secondary | ICD-10-CM | POA: Diagnosis not present

## 2014-12-07 DIAGNOSIS — M7581 Other shoulder lesions, right shoulder: Secondary | ICD-10-CM | POA: Diagnosis not present

## 2014-12-22 DIAGNOSIS — I482 Chronic atrial fibrillation: Secondary | ICD-10-CM | POA: Diagnosis not present

## 2015-01-21 DIAGNOSIS — I482 Chronic atrial fibrillation: Secondary | ICD-10-CM | POA: Diagnosis not present

## 2015-02-10 DIAGNOSIS — E119 Type 2 diabetes mellitus without complications: Secondary | ICD-10-CM | POA: Diagnosis not present

## 2015-02-10 DIAGNOSIS — Z0000 Encounter for general adult medical examination without abnormal findings: Secondary | ICD-10-CM | POA: Diagnosis not present

## 2015-02-10 DIAGNOSIS — E039 Hypothyroidism, unspecified: Secondary | ICD-10-CM | POA: Diagnosis not present

## 2015-02-10 DIAGNOSIS — M5136 Other intervertebral disc degeneration, lumbar region: Secondary | ICD-10-CM | POA: Diagnosis not present

## 2015-02-10 DIAGNOSIS — E7800 Pure hypercholesterolemia, unspecified: Secondary | ICD-10-CM | POA: Diagnosis not present

## 2015-02-10 DIAGNOSIS — I482 Chronic atrial fibrillation: Secondary | ICD-10-CM | POA: Diagnosis not present

## 2015-02-10 DIAGNOSIS — Z79899 Other long term (current) drug therapy: Secondary | ICD-10-CM | POA: Diagnosis not present

## 2015-02-10 DIAGNOSIS — J069 Acute upper respiratory infection, unspecified: Secondary | ICD-10-CM | POA: Diagnosis not present

## 2015-02-18 DIAGNOSIS — Z79899 Other long term (current) drug therapy: Secondary | ICD-10-CM | POA: Diagnosis not present

## 2015-02-18 DIAGNOSIS — E119 Type 2 diabetes mellitus without complications: Secondary | ICD-10-CM | POA: Diagnosis not present

## 2015-02-18 DIAGNOSIS — I482 Chronic atrial fibrillation: Secondary | ICD-10-CM | POA: Diagnosis not present

## 2015-03-17 DIAGNOSIS — I350 Nonrheumatic aortic (valve) stenosis: Secondary | ICD-10-CM | POA: Diagnosis not present

## 2015-03-17 DIAGNOSIS — Z9889 Other specified postprocedural states: Secondary | ICD-10-CM | POA: Diagnosis not present

## 2015-03-17 DIAGNOSIS — I495 Sick sinus syndrome: Secondary | ICD-10-CM | POA: Diagnosis not present

## 2015-03-17 DIAGNOSIS — I10 Essential (primary) hypertension: Secondary | ICD-10-CM | POA: Diagnosis not present

## 2015-03-17 DIAGNOSIS — I2119 ST elevation (STEMI) myocardial infarction involving other coronary artery of inferior wall: Secondary | ICD-10-CM | POA: Diagnosis not present

## 2015-03-17 DIAGNOSIS — E119 Type 2 diabetes mellitus without complications: Secondary | ICD-10-CM | POA: Diagnosis not present

## 2015-03-17 DIAGNOSIS — I214 Non-ST elevation (NSTEMI) myocardial infarction: Secondary | ICD-10-CM | POA: Diagnosis not present

## 2015-03-17 DIAGNOSIS — I714 Abdominal aortic aneurysm, without rupture: Secondary | ICD-10-CM | POA: Diagnosis not present

## 2015-03-17 DIAGNOSIS — I480 Paroxysmal atrial fibrillation: Secondary | ICD-10-CM | POA: Diagnosis not present

## 2015-04-05 DIAGNOSIS — I10 Essential (primary) hypertension: Secondary | ICD-10-CM | POA: Diagnosis not present

## 2015-04-05 DIAGNOSIS — J069 Acute upper respiratory infection, unspecified: Secondary | ICD-10-CM | POA: Diagnosis not present

## 2015-04-05 DIAGNOSIS — K219 Gastro-esophageal reflux disease without esophagitis: Secondary | ICD-10-CM | POA: Diagnosis not present

## 2015-04-05 DIAGNOSIS — E039 Hypothyroidism, unspecified: Secondary | ICD-10-CM | POA: Diagnosis not present

## 2015-04-09 ENCOUNTER — Emergency Department: Payer: Commercial Managed Care - HMO

## 2015-04-09 ENCOUNTER — Encounter: Payer: Self-pay | Admitting: Emergency Medicine

## 2015-04-09 ENCOUNTER — Emergency Department
Admission: EM | Admit: 2015-04-09 | Discharge: 2015-04-09 | Disposition: A | Discharge status: Home / Self Care | Payer: Commercial Managed Care - HMO | Source: Home / Self Care | Attending: Emergency Medicine | Admitting: Emergency Medicine

## 2015-04-09 DIAGNOSIS — I2510 Atherosclerotic heart disease of native coronary artery without angina pectoris: Secondary | ICD-10-CM

## 2015-04-09 DIAGNOSIS — I25118 Atherosclerotic heart disease of native coronary artery with other forms of angina pectoris: Secondary | ICD-10-CM | POA: Diagnosis not present

## 2015-04-09 DIAGNOSIS — Z87891 Personal history of nicotine dependence: Secondary | ICD-10-CM

## 2015-04-09 DIAGNOSIS — I10 Essential (primary) hypertension: Secondary | ICD-10-CM

## 2015-04-09 DIAGNOSIS — I493 Ventricular premature depolarization: Secondary | ICD-10-CM | POA: Diagnosis present

## 2015-04-09 DIAGNOSIS — I482 Chronic atrial fibrillation, unspecified: Secondary | ICD-10-CM

## 2015-04-09 DIAGNOSIS — Z794 Long term (current) use of insulin: Secondary | ICD-10-CM | POA: Diagnosis not present

## 2015-04-09 DIAGNOSIS — R0602 Shortness of breath: Secondary | ICD-10-CM | POA: Diagnosis not present

## 2015-04-09 DIAGNOSIS — Y658 Other specified misadventures during surgical and medical care: Secondary | ICD-10-CM

## 2015-04-09 DIAGNOSIS — R791 Abnormal coagulation profile: Secondary | ICD-10-CM | POA: Diagnosis present

## 2015-04-09 DIAGNOSIS — J810 Acute pulmonary edema: Secondary | ICD-10-CM

## 2015-04-09 DIAGNOSIS — Z950 Presence of cardiac pacemaker: Secondary | ICD-10-CM | POA: Diagnosis not present

## 2015-04-09 DIAGNOSIS — I252 Old myocardial infarction: Secondary | ICD-10-CM | POA: Insufficient documentation

## 2015-04-09 DIAGNOSIS — E039 Hypothyroidism, unspecified: Secondary | ICD-10-CM | POA: Diagnosis not present

## 2015-04-09 DIAGNOSIS — E785 Hyperlipidemia, unspecified: Secondary | ICD-10-CM | POA: Diagnosis not present

## 2015-04-09 DIAGNOSIS — I5022 Chronic systolic (congestive) heart failure: Secondary | ICD-10-CM | POA: Diagnosis not present

## 2015-04-09 DIAGNOSIS — J209 Acute bronchitis, unspecified: Secondary | ICD-10-CM | POA: Diagnosis not present

## 2015-04-09 DIAGNOSIS — Z7902 Long term (current) use of antithrombotics/antiplatelets: Secondary | ICD-10-CM

## 2015-04-09 DIAGNOSIS — I499 Cardiac arrhythmia, unspecified: Secondary | ICD-10-CM | POA: Insufficient documentation

## 2015-04-09 DIAGNOSIS — Z7982 Long term (current) use of aspirin: Secondary | ICD-10-CM

## 2015-04-09 DIAGNOSIS — Z7984 Long term (current) use of oral hypoglycemic drugs: Secondary | ICD-10-CM | POA: Insufficient documentation

## 2015-04-09 DIAGNOSIS — I509 Heart failure, unspecified: Secondary | ICD-10-CM | POA: Diagnosis not present

## 2015-04-09 DIAGNOSIS — Z91048 Other nonmedicinal substance allergy status: Secondary | ICD-10-CM | POA: Diagnosis not present

## 2015-04-09 DIAGNOSIS — Z7901 Long term (current) use of anticoagulants: Secondary | ICD-10-CM | POA: Insufficient documentation

## 2015-04-09 DIAGNOSIS — I25110 Atherosclerotic heart disease of native coronary artery with unstable angina pectoris: Secondary | ICD-10-CM | POA: Diagnosis not present

## 2015-04-09 DIAGNOSIS — J811 Chronic pulmonary edema: Secondary | ICD-10-CM

## 2015-04-09 DIAGNOSIS — T82198A Other mechanical complication of other cardiac electronic device, initial encounter: Secondary | ICD-10-CM

## 2015-04-09 DIAGNOSIS — Z79899 Other long term (current) drug therapy: Secondary | ICD-10-CM

## 2015-04-09 DIAGNOSIS — Z882 Allergy status to sulfonamides status: Secondary | ICD-10-CM | POA: Diagnosis not present

## 2015-04-09 DIAGNOSIS — E119 Type 2 diabetes mellitus without complications: Secondary | ICD-10-CM | POA: Insufficient documentation

## 2015-04-09 DIAGNOSIS — I429 Cardiomyopathy, unspecified: Secondary | ICD-10-CM | POA: Diagnosis not present

## 2015-04-09 DIAGNOSIS — K219 Gastro-esophageal reflux disease without esophagitis: Secondary | ICD-10-CM | POA: Diagnosis present

## 2015-04-09 DIAGNOSIS — Z951 Presence of aortocoronary bypass graft: Secondary | ICD-10-CM | POA: Diagnosis not present

## 2015-04-09 DIAGNOSIS — R002 Palpitations: Secondary | ICD-10-CM | POA: Diagnosis not present

## 2015-04-09 DIAGNOSIS — Z952 Presence of prosthetic heart valve: Secondary | ICD-10-CM | POA: Diagnosis not present

## 2015-04-09 DIAGNOSIS — I480 Paroxysmal atrial fibrillation: Secondary | ICD-10-CM | POA: Diagnosis not present

## 2015-04-09 DIAGNOSIS — R05 Cough: Secondary | ICD-10-CM | POA: Diagnosis not present

## 2015-04-09 DIAGNOSIS — I5023 Acute on chronic systolic (congestive) heart failure: Secondary | ICD-10-CM | POA: Diagnosis not present

## 2015-04-09 DIAGNOSIS — I110 Hypertensive heart disease with heart failure: Secondary | ICD-10-CM | POA: Diagnosis not present

## 2015-04-09 DIAGNOSIS — Z8249 Family history of ischemic heart disease and other diseases of the circulatory system: Secondary | ICD-10-CM | POA: Diagnosis not present

## 2015-04-09 LAB — BASIC METABOLIC PANEL
Anion gap: 6 (ref 5–15)
BUN: 22 mg/dL — ABNORMAL HIGH (ref 6–20)
CALCIUM: 8.3 mg/dL — AB (ref 8.9–10.3)
CO2: 27 mmol/L (ref 22–32)
Chloride: 107 mmol/L (ref 101–111)
Creatinine, Ser: 1.2 mg/dL (ref 0.61–1.24)
GFR calc Af Amer: >60 mL/min (ref >60)
GFR calc non Af Amer: 56 mL/min — ABNORMAL LOW (ref >60)
GLUCOSE: 126 mg/dL — AB (ref 65–99)
Potassium: 3.9 mmol/L (ref 3.5–5.1)
Sodium: 140 mmol/L (ref 135–145)

## 2015-04-09 LAB — CBC WITH DIFFERENTIAL/PLATELET
Basophils Absolute: 0 10*3/uL (ref 0–0.1)
EOS ABS: 0.3 10*3/uL (ref 0–0.7)
Eosinophils Relative: 4 %
HEMATOCRIT: 37.2 % — AB (ref 40.0–52.0)
Hemoglobin: 12.5 g/dL — ABNORMAL LOW (ref 13.0–18.0)
Lymphocytes Relative: 19 %
Lymphs Abs: 1.3 10*3/uL (ref 1.0–3.6)
MCH: 32.5 pg (ref 26.0–34.0)
MCHC: 33.6 g/dL (ref 32.0–36.0)
MCV: 96.6 fL (ref 80.0–100.0)
MONO ABS: 0.7 10*3/uL (ref 0.2–1.0)
Neutro Abs: 4.8 10*3/uL (ref 1.4–6.5)
Neutrophils Relative %: 66 %
Platelets: 96 10*3/uL — ABNORMAL LOW (ref 150–440)
RBC: 3.85 MIL/uL — ABNORMAL LOW (ref 4.40–5.90)
RDW: 13 % (ref 11.5–14.5)
WBC: 7.2 10*3/uL (ref 3.8–10.6)

## 2015-04-09 LAB — PROTIME-INR
INR: 3.21
Prothrombin Time: 32.2 seconds — ABNORMAL HIGH (ref 11.4–15.0)

## 2015-04-09 LAB — TROPONIN I: Troponin I: <0.03 ng/mL (ref <0.031)

## 2015-04-09 LAB — MAGNESIUM: MAGNESIUM: 1.6 mg/dL — AB (ref 1.7–2.4)

## 2015-04-09 LAB — BRAIN NATRIURETIC PEPTIDE: B Natriuretic Peptide: 1395 pg/mL — ABNORMAL HIGH (ref 0.0–100.0)

## 2015-04-09 MED ORDER — METOPROLOL TARTRATE 50 MG PO TABS: 50.0000 mg | ORAL_TABLET | Freq: Three times a day (TID) | ORAL | Status: DC

## 2015-04-09 MED ORDER — METOPROLOL TARTRATE 50 MG PO TABS
50.0000 mg | ORAL_TABLET | Freq: Once | ORAL | Status: AC
  Administered 2015-04-09: 50 mg via ORAL
  Filled 2015-04-09: qty 1

## 2015-04-09 MED ORDER — METOPROLOL TARTRATE 1 MG/ML IV SOLN
5.0000 mg | Freq: Once | INTRAVENOUS | Status: AC
  Administered 2015-04-09: 5 mg via INTRAVENOUS
  Filled 2015-04-09: qty 5

## 2015-04-09 NOTE — ED Provider Notes (Signed)
Time Seen: Approximately *0835 I have reviewed the triage notes  Chief Complaint: Shortness of Breath and AICD Problem   History of Present Illness: Derrick Cochran is a 80 y.o. male who presents with a one-week history of his defibrillator firing 1-2 times per day. Last time his defibrillator fired was yesterday evening. Patient has a history of a pacemaker defibrillator for sick sinus syndrome along with a atrial fibrillation. Patient is on beta blocker therapy to suppress his heart rate. He states he has been taking his medication. He describes some increased shortness of breath without peripheral edema. He denies any persistent chest, arm, jaw pain. Fever chills productive cough or wheezing. Denies any calf tenderness or swelling. He has a history of a heart valve replacements on chronic anticoagulation therapy. They have been in contact with their local cardiologist and referred here to emergency department for further evaluation.   Past Medical History  Diagnosis Date  . Coronary artery disease   . Hypertension   . Myocardial infarction ([place])   . Hypothyroidism   . Diabetes mellitus without complication ([place])   . GERD (gastroesophageal reflux disease)   . Arthritis   . Heart murmur   . Shortness of breath dyspnea   . Cancer [facility])     skin    Patient Active Problem List   Diagnosis Date Noted  . Sick sinus syndrome ([place]) 07/03/2014  . Atrial fibrillation with rapid ventricular response ([place]) 07/02/2014  . Atrial fibrillation ([place]) 06/17/2014    Past Surgical History  Procedure Laterality Date  . Coronary artery bypass graft    . Cardiac catheterization    . Appendectomy    . Cardiac valve replacement      Bovine transcatheter heart valve  . Great toe arthrodesis, interphalangeal joint Left   . Pacemaker insertion N/A 07/02/2014    Procedure: INSERTION PACEMAKER/DUAL CHAMBER  INITIAL IMPLANT;  Surgeon: [name], MD;  Location: [facility] ORS;  Service:  Cardiovascular;  Laterality: N/A;  . Electrophysiologic study N/A 08/06/2014    Procedure: Cardioversion;  Surgeon: [name], MD;  Location: [place] CV LAB;  Service: Cardiovascular;  Laterality: N/A;  . Electrophysiologic study N/A 06/18/2014    Procedure: CARDIOVERSION;  Surgeon: [name], MD;  Location: [facility] ORS;  Service: Cardiovascular;  Laterality: N/A;    Past Surgical History  Procedure Laterality Date  . Coronary artery bypass graft    . Cardiac catheterization    . Appendectomy    . Cardiac valve replacement      Bovine transcatheter heart valve  . Great toe arthrodesis, interphalangeal joint Left   . Pacemaker insertion N/A 07/02/2014    Procedure: INSERTION PACEMAKER/DUAL CHAMBER  INITIAL IMPLANT;  Surgeon: [name], MD;  Location: [facility] ORS;  Service: Cardiovascular;  Laterality: N/A;  . Electrophysiologic study N/A 08/06/2014    Procedure: Cardioversion;  Surgeon: [name], MD;  Location: [place] CV LAB;  Service: Cardiovascular;  Laterality: N/A;  . Electrophysiologic study N/A 06/18/2014    Procedure: CARDIOVERSION;  Surgeon: [name], MD;  Location: [facility] ORS;  Service: Cardiovascular;  Laterality: N/A;    Current Outpatient Rx  Name  Route  Sig  Dispense  Refill  . acetaminophen (TYLENOL) 500 MG tablet   Oral   Take 500 mg by mouth every 6 (six) hours as needed.         Marland Kitchen aspirin EC 81 MG tablet   Oral   Take 81 mg by mouth daily.         Marland Kitchen  cephALEXin (KEFLEX) 250 MG capsule   Oral   Take 1 capsule (250 mg total) by mouth 4 (four) times daily. Patient not taking: Reported on 08/05/2014   28 capsule   0   . clopidogrel (PLAVIX) 75 MG tablet   Oral   Take 37.5 mg by mouth daily.         . enalapril (VASOTEC) 5 MG tablet   Oral   Take 5 mg by mouth daily.         . furosemide (LASIX) 20 MG tablet   Oral   Take 20 mg by mouth daily.          Marland Kitchen gabapentin (NEURONTIN) 300 MG capsule    Oral   Take 300 mg by mouth 2 (two) times daily.          Marland Kitchen glipiZIDE (GLUCOTROL) 5 MG tablet   Oral   Take 5 mg by mouth 2 (two) times daily.          Marland Kitchen levothyroxine (SYNTHROID, LEVOTHROID) 137 MCG tablet   Oral   Take 137 mcg by mouth daily.          . metFORMIN (GLUCOPHAGE) 500 MG tablet   Oral   Take 500 mg by mouth 2 (two) times daily.          . metoprolol succinate (TOPROL XL) 50 MG 24 hr tablet   Oral   Take 1 tablet (50 mg total) by mouth daily. Take with or immediately following a meal. Patient taking differently: Take 25 mg by mouth daily. Take with or immediately following a meal.   30 tablet   5   . metoprolol succinate (TOPROL XL) 50 MG 24 hr tablet   Oral   Take 1 tablet (50 mg total) by mouth daily. Take with or immediately following a meal.   39 tablet   5   . metoprolol succinate (TOPROL-XL) 50 MG 24 hr tablet   Oral   Take 1 tablet (50 mg total) by mouth daily. Take with or immediately following a meal.   30 tablet   5   . omeprazole (PRILOSEC) 20 MG capsule   Oral   Take 20 mg by mouth daily.         . simvastatin (ZOCOR) 40 MG tablet   Oral   Take 40 mg by mouth daily.         . sotalol (BETAPACE) 80 MG tablet   Oral   Take 1 tablet (80 mg total) by mouth every 12 (twelve) hours.   60 tablet   6   . sotalol (BETAPACE) 80 MG tablet   Oral   Take 1 tablet (80 mg total) by mouth 2 (two) times daily.   60 tablet   5   . sotalol (BETAPACE) 80 MG tablet   Oral   Take 1 tablet (80 mg total) by mouth 2 (two) times daily.   60 tablet   5   . tamsulosin (FLOMAX) 0.4 MG CAPS capsule   Oral   Take 0.4 mg by mouth every morning.          . temazepam (RESTORIL) 15 MG capsule   Oral   Take 15 mg by mouth at bedtime as needed for sleep.         Marland Kitchen warfarin (COUMADIN) 1 MG tablet   Oral   Take 1 mg by mouth daily. Pt takes with 3mg  tablet for a total dose of 4mg .         Marland Kitchen  warfarin (COUMADIN) 3 MG tablet   Oral   Take 3  mg by mouth daily. Pt takes with 1mg  tablet for a total dose of 4mg .           Allergies:  Adhesive; Sulfa antibiotics; and Sulfamethoxazole  Family History: History reviewed. No pertinent family history.  Social History: Social History  Substance Use Topics  . Smoking status: Former Smoker    Types: Cigarettes  . Smokeless tobacco: Former Systems developer    Types: Chew  . Alcohol Use: No     Review of Systems:   10 point review of systems was performed and was otherwise negative:  Constitutional: No fever. Generalized weakness Eyes: No visual disturbances ENT: No sore throat, ear pain Cardiac: No chest pain Respiratory: Increasing shortness of breath without, wheezing, or stridor Abdomen: No abdominal pain, no vomiting, No diarrhea Endocrine: No weight loss, No night sweats Extremities: No new peripheral edema, cyanosis Skin: No rashes, easy bruising Neurologic: No focal weakness, trouble with speech or swollowing Urologic: No dysuria, Hematuria, or urinary frequency   Physical Exam:  ED Triage Vitals  Enc Vitals Group     BP 04/09/15 0814 130/91 mmHg     Pulse Rate 04/09/15 0814 51     Resp 04/09/15 0814 23     Temp 04/09/15 0814 97.7 F (36.5 C)     Temp Source 04/09/15 0814 Oral     SpO2 04/09/15 0814 97 %     Weight 04/09/15 0814 190 lb (86.183 kg)     Height 04/09/15 0814 5\' 11"  (1.803 m)     Head Cir --      Peak Flow --      Pain Score --      Pain Loc --      Pain Edu? --      Excl. in [place]? --     General: Awake , Alert , and Oriented times 3; GCS 15 Head: Normal cephalic , atraumatic Eyes: Pupils equal , round, reactive to light Nose/Throat: No nasal drainage, patent upper airway without erythema or exudate.  Neck: Supple, Full range of motion, No anterior adenopathy or palpable thyroid masses Lungs: Clear to ascultation without wheezes , rhonchi, or rales Heart: Irregular rate, irregular rhythm without murmurs , gallops , or rubs Abdomen: Soft, non  tender without rebound, guarding , or rigidity; bowel sounds positive and symmetric in all 4 quadrants. No organomegaly .        Extremities: 2 plus symmetric pulses. No edema, clubbing or cyanosis Neurologic: normal ambulation, Motor symmetric without deficits, sensory intact Skin: warm, dry, no rashes   Labs:   All laboratory work was reviewed including any pertinent negatives or positives listed below:  Labs Reviewed  BASIC METABOLIC PANEL  CBC WITH DIFFERENTIAL/PLATELET  MAGNESIUM  TROPONIN I  [place]   BNP is slightly elevated  EKG: ED ECG REPORT I, Daymon Larsen, the attending physician, personally viewed and interpreted this ECG.  Date: 04/09/2015 EKG Time: 804 Rate: 98 Rhythm: Atrial fibrillation with a left bundle branch block pattern QRS Axis: normal Intervals: normal ST/T Wave abnormalities: normal Conduction Disturbances: none Narrative Interpretation: unremarkable No acute ischemic changes   Radiology: *   EXAM: CHEST 2 VIEW  COMPARISON: 07/02/2014; 06/29/2014  FINDINGS: Grossly unchanged enlarged cardiac silhouette and mediastinal contours post median sternotomy, CABG and percutaneous aortic valve replacement. Stable positioning of support apparatus. The lungs remain hyperexpanded with flattening of the diaphragms and mild diffuse slightly nodular  thickening of the pulmonary interstitium. There is chronic blunting of the bilateral costophrenic angles without definite pleural effusion. No pneumothorax evidence of edema. No acute osseus abnormalities.  IMPRESSION: Hyperexpanded lungs and bronchitic change without definite superimposed acute cardiopulmonary disease. Specifically, no focal airspace opacities suggest pneumonia.   Electronically Signed By: Sandi Mariscal M.D.    I personally reviewed the radiologic studies     ED Course: * Patient was placed on a continuous cardiac monitor remained in  atrial fibrillation with a controlled rate this presented with a heart rate no one 105-110 range. We interrogated his defibrillator and appears that these were all episodes of atrial fibrillation without any evidence of ventricular fibrillation. The patient was given IV Lopressor and slowed down to a rate in the low to mid 90s and felt stable at this point. He'll be increased on his metoprolol tartrate 3 times a day after phone conversation with the on-call cardiologist Dr. Nehemiah Massed. The patient has some mild elevation in his BNP which I felt was most likely high output pulmonary edema that's not evident currently on his exam or chest x-ray. I felt that this would likely correct itself with slowing his heart rate with the metaprolol on an outpatient basis. He was given additional tablet here as well as metoprolol and was given a fresh prescription to take it 3 times a day. She has wife appears to be of understanding at this point. They've been advised to return if fevers any other new concerns and I felt we could hold diuretic therapy for the time being.   Assessment: * Atrial fibrillation with history of sick sinus syndrome and pacemaker Defibrillator firing Mild pulmonary edema      Plan:  Discharge home Outpatient follow-up with cardiology            Daymon Larsen, MD 04/09/15 1054

## 2015-04-09 NOTE — Discharge Instructions (Signed)
Atrial Fibrillation Atrial fibrillation is a type of heartbeat that is irregular or fast (rapid). If you have this condition, your heart keeps quivering in a weird (chaotic) way. This condition can make it so your heart cannot pump blood normally. Having this condition gives a person more risk for stroke, heart failure, and other heart problems. There are different types of atrial fibrillation. Talk with your doctor to learn about the type that you have. HOME CARE  Take over-the-counter and prescription medicines only as told by your doctor.  If your doctor prescribed a blood-thinning medicine, take it exactly as told. Taking too much of it can cause bleeding. If you do not take enough of it, you will not have the protection that you need against stroke and other problems.  Do not use any tobacco products. These include cigarettes, chewing tobacco, and e-cigarettes. If you need help quitting, ask your doctor.  If you have apnea (obstructive sleep apnea), manage it as told by your doctor.  Do not drink alcohol.  Do not drink beverages that have caffeine. These include coffee, soda, and tea.  Maintain a healthy weight. Do not use diet pills unless your doctor says they are safe for you. Diet pills may make heart problems worse.  Follow diet instructions as told by your doctor.  Exercise regularly as told by your doctor.  Keep all follow-up visits as told by your doctor. This is important. GET HELP IF:  You notice a change in the speed, rhythm, or strength of your heartbeat.  You are taking a blood-thinning medicine and you notice more bruising.  You get tired more easily when you move or exercise. GET HELP RIGHT AWAY IF:  You have pain in your chest or your belly (abdomen).  You have sweating or weakness.  You feel sick to your stomach (nauseous).  You notice blood in your throw up (vomit), poop (stool), or pee (urine).  You are short of breath.  You suddenly have swollen feet  and ankles.  You feel dizzy.  Your suddenly get weak or numb in your face, arms, or legs, especially if it happens on one side of your body.  You have trouble talking, trouble understanding, or both.  Your face or your eyelid droops on one side. These symptoms may be an emergency. Do not wait to see if the symptoms will go away. Get medical help right away. Call your local emergency services (911 in the [place] Do not drive yourself to the hospital.   This information is not intended to replace advice given to you by your health care provider. Make sure you discuss any questions you have with your health care provider.   Document Released: 11/09/2007 Document Revised: 10/21/2014 Document Reviewed: 05/27/2014 Elsevier Interactive Patient Education [employer].   Please return immediately if condition worsens. Please contact her primary physician or the physician you were given for referral. If you have any specialist physicians involved in her treatment and plan please also contact them. Thank you for using [facility]. We are increasing her metoprolol to 3 times a day. Please contact your cardiologist later today for further follow-up on Monday. Dr. Nehemiah Massed is on call this week and advised to call him if you have any concerns. Continue all of your other prescribed medications.

## 2015-04-09 NOTE — ED Notes (Addendum)
Shob, Palpatations, and "pacemaker shocking" for 1 week worsening today. Patient has a medtronic device and bovine transcatheter heart valve.  Diagnosed with  influenza Monday.

## 2015-04-11 ENCOUNTER — Inpatient Hospital Stay
Admission: EM | Admit: 2015-04-11 | Discharge: 2015-04-13 | DRG: 293 | Disposition: A | Discharge status: Home / Self Care | Payer: Commercial Managed Care - HMO | Attending: Internal Medicine | Admitting: Internal Medicine

## 2015-04-11 ENCOUNTER — Encounter: Payer: Self-pay | Admitting: Emergency Medicine

## 2015-04-11 ENCOUNTER — Inpatient Hospital Stay
Admit: 2015-04-11 | Discharge: 2015-04-11 | Disposition: A | Discharge status: Home / Self Care | Payer: Commercial Managed Care - HMO | Attending: Internal Medicine | Admitting: Internal Medicine

## 2015-04-11 ENCOUNTER — Emergency Department: Payer: Commercial Managed Care - HMO

## 2015-04-11 DIAGNOSIS — K219 Gastro-esophageal reflux disease without esophagitis: Secondary | ICD-10-CM | POA: Diagnosis present

## 2015-04-11 DIAGNOSIS — I480 Paroxysmal atrial fibrillation: Secondary | ICD-10-CM | POA: Diagnosis present

## 2015-04-11 DIAGNOSIS — E039 Hypothyroidism, unspecified: Secondary | ICD-10-CM | POA: Diagnosis present

## 2015-04-11 DIAGNOSIS — I252 Old myocardial infarction: Secondary | ICD-10-CM

## 2015-04-11 DIAGNOSIS — Z87891 Personal history of nicotine dependence: Secondary | ICD-10-CM | POA: Diagnosis not present

## 2015-04-11 DIAGNOSIS — Z952 Presence of prosthetic heart valve: Secondary | ICD-10-CM

## 2015-04-11 DIAGNOSIS — Z7901 Long term (current) use of anticoagulants: Secondary | ICD-10-CM | POA: Diagnosis not present

## 2015-04-11 DIAGNOSIS — E119 Type 2 diabetes mellitus without complications: Secondary | ICD-10-CM | POA: Diagnosis present

## 2015-04-11 DIAGNOSIS — Z8249 Family history of ischemic heart disease and other diseases of the circulatory system: Secondary | ICD-10-CM

## 2015-04-11 DIAGNOSIS — Z79899 Other long term (current) drug therapy: Secondary | ICD-10-CM

## 2015-04-11 DIAGNOSIS — E785 Hyperlipidemia, unspecified: Secondary | ICD-10-CM | POA: Diagnosis present

## 2015-04-11 DIAGNOSIS — Z7982 Long term (current) use of aspirin: Secondary | ICD-10-CM

## 2015-04-11 DIAGNOSIS — Z91048 Other nonmedicinal substance allergy status: Secondary | ICD-10-CM

## 2015-04-11 DIAGNOSIS — J209 Acute bronchitis, unspecified: Secondary | ICD-10-CM | POA: Diagnosis present

## 2015-04-11 DIAGNOSIS — Z882 Allergy status to sulfonamides status: Secondary | ICD-10-CM | POA: Diagnosis not present

## 2015-04-11 DIAGNOSIS — I509 Heart failure, unspecified: Secondary | ICD-10-CM

## 2015-04-11 DIAGNOSIS — J810 Acute pulmonary edema: Secondary | ICD-10-CM

## 2015-04-11 DIAGNOSIS — R791 Abnormal coagulation profile: Secondary | ICD-10-CM | POA: Diagnosis present

## 2015-04-11 DIAGNOSIS — I110 Hypertensive heart disease with heart failure: Principal | ICD-10-CM | POA: Diagnosis present

## 2015-04-11 DIAGNOSIS — I2510 Atherosclerotic heart disease of native coronary artery without angina pectoris: Secondary | ICD-10-CM | POA: Diagnosis present

## 2015-04-11 DIAGNOSIS — I429 Cardiomyopathy, unspecified: Secondary | ICD-10-CM | POA: Diagnosis present

## 2015-04-11 DIAGNOSIS — Z951 Presence of aortocoronary bypass graft: Secondary | ICD-10-CM

## 2015-04-11 DIAGNOSIS — I493 Ventricular premature depolarization: Secondary | ICD-10-CM | POA: Diagnosis present

## 2015-04-11 DIAGNOSIS — Z794 Long term (current) use of insulin: Secondary | ICD-10-CM | POA: Diagnosis not present

## 2015-04-11 DIAGNOSIS — Z950 Presence of cardiac pacemaker: Secondary | ICD-10-CM | POA: Diagnosis not present

## 2015-04-11 DIAGNOSIS — I5023 Acute on chronic systolic (congestive) heart failure: Secondary | ICD-10-CM | POA: Diagnosis present

## 2015-04-11 DIAGNOSIS — R002 Palpitations: Secondary | ICD-10-CM

## 2015-04-11 LAB — COMPREHENSIVE METABOLIC PANEL WITH GFR
ALT: 35 U/L (ref 17–63)
AST: 37 U/L (ref 15–41)
Albumin: 3.6 g/dL (ref 3.5–5.0)
Alkaline Phosphatase: 55 U/L (ref 38–126)
Anion gap: 9 (ref 5–15)
BUN: 19 mg/dL (ref 6–20)
CO2: 26 mmol/L (ref 22–32)
Calcium: 8.9 mg/dL (ref 8.9–10.3)
Chloride: 104 mmol/L (ref 101–111)
Creatinine, Ser: 1.09 mg/dL (ref 0.61–1.24)
GFR calc Af Amer: >60 mL/min
GFR calc non Af Amer: >60 mL/min
Glucose, Bld: 195 mg/dL — ABNORMAL HIGH (ref 65–99)
Potassium: 4.9 mmol/L (ref 3.5–5.1)
Sodium: 139 mmol/L (ref 135–145)
Total Bilirubin: 1.1 mg/dL (ref 0.3–1.2)
Total Protein: 7.6 g/dL (ref 6.5–8.1)

## 2015-04-11 LAB — TROPONIN I
TROPONIN I: 0.03 ng/mL (ref <0.031)
Troponin I: <0.03 ng/mL
Troponin I: <0.03 ng/mL (ref <0.031)

## 2015-04-11 LAB — CBC
HEMATOCRIT: 39.3 % — AB (ref 40.0–52.0)
HEMOGLOBIN: 13.3 g/dL (ref 13.0–18.0)
MCH: 32.8 pg (ref 26.0–34.0)
MCHC: 33.9 g/dL (ref 32.0–36.0)
MCV: 96.8 fL (ref 80.0–100.0)
Platelets: 144 10*3/uL — ABNORMAL LOW (ref 150–440)
RBC: 4.06 MIL/uL — ABNORMAL LOW (ref 4.40–5.90)
RDW: 13.3 % (ref 11.5–14.5)
WBC: 9.2 10*3/uL (ref 3.8–10.6)

## 2015-04-11 LAB — INFLUENZA PANEL BY PCR (TYPE A & B)
H1N1FLUPCR: NOT DETECTED
Influenza A By PCR: NEGATIVE
Influenza B By PCR: NEGATIVE

## 2015-04-11 LAB — PROTIME-INR
INR: 4
Prothrombin Time: 38 seconds — ABNORMAL HIGH (ref 11.4–15.0)

## 2015-04-11 LAB — GLUCOSE, CAPILLARY
GLUCOSE-CAPILLARY: 142 mg/dL — AB (ref 65–99)
Glucose-Capillary: 134 mg/dL — ABNORMAL HIGH (ref 65–99)
Glucose-Capillary: 78 mg/dL (ref 65–99)

## 2015-04-11 LAB — BRAIN NATRIURETIC PEPTIDE: B Natriuretic Peptide: 3776 pg/mL — ABNORMAL HIGH (ref 0.0–100.0)

## 2015-04-11 LAB — TSH: TSH: 2.947 u[IU]/mL (ref 0.350–4.500)

## 2015-04-11 LAB — MAGNESIUM: MAGNESIUM: 1.5 mg/dL — AB (ref 1.7–2.4)

## 2015-04-11 MED ORDER — FUROSEMIDE 10 MG/ML IJ SOLN
40.0000 mg | Freq: Once | INTRAMUSCULAR | Status: AC
  Administered 2015-04-11: 40 mg via INTRAVENOUS
  Filled 2015-04-11: qty 4

## 2015-04-11 MED ORDER — ACETAMINOPHEN 500 MG PO TABS: 500.0000 mg | ORAL_TABLET | Freq: Four times a day (QID) | ORAL | Status: DC | PRN

## 2015-04-11 MED ORDER — METOPROLOL TARTRATE 50 MG PO TABS
50.0000 mg | ORAL_TABLET | Freq: Three times a day (TID) | ORAL | Status: DC
  Administered 2015-04-11 – 2015-04-13 (×5): 50 mg via ORAL
  Filled 2015-04-11 (×6): qty 1

## 2015-04-11 MED ORDER — ONDANSETRON HCL 4 MG PO TABS: 4.0000 mg | ORAL_TABLET | Freq: Four times a day (QID) | ORAL | Status: DC | PRN

## 2015-04-11 MED ORDER — METFORMIN HCL 500 MG PO TABS
500.0000 mg | ORAL_TABLET | Freq: Two times a day (BID) | ORAL | Status: DC
  Administered 2015-04-11 – 2015-04-13 (×5): 500 mg via ORAL
  Filled 2015-04-11 (×5): qty 1

## 2015-04-11 MED ORDER — GUAIFENESIN-CODEINE 100-10 MG/5ML PO SOLN: 5.0000 mL | Freq: Four times a day (QID) | ORAL | Status: DC | PRN

## 2015-04-11 MED ORDER — LEVOTHYROXINE SODIUM 137 MCG PO TABS
137.0000 ug | ORAL_TABLET | Freq: Every day | ORAL | Status: DC
  Administered 2015-04-11 – 2015-04-13 (×3): 137 ug via ORAL
  Filled 2015-04-11 (×3): qty 1

## 2015-04-11 MED ORDER — SODIUM CHLORIDE 0.9% FLUSH
3.0000 mL | Freq: Two times a day (BID) | INTRAVENOUS | Status: DC
  Administered 2015-04-11 – 2015-04-13 (×5): 3 mL via INTRAVENOUS

## 2015-04-11 MED ORDER — GABAPENTIN 300 MG PO CAPS
300.0000 mg | ORAL_CAPSULE | Freq: Two times a day (BID) | ORAL | Status: DC
  Administered 2015-04-11 – 2015-04-13 (×5): 300 mg via ORAL
  Filled 2015-04-11 (×5): qty 1

## 2015-04-11 MED ORDER — PANTOPRAZOLE SODIUM 40 MG PO TBEC
40.0000 mg | DELAYED_RELEASE_TABLET | Freq: Every day | ORAL | Status: DC
  Administered 2015-04-11 – 2015-04-13 (×3): 40 mg via ORAL
  Filled 2015-04-11 (×3): qty 1

## 2015-04-11 MED ORDER — ENALAPRIL MALEATE 5 MG PO TABS
5.0000 mg | ORAL_TABLET | Freq: Two times a day (BID) | ORAL | Status: DC
  Administered 2015-04-11 – 2015-04-13 (×5): 5 mg via ORAL
  Filled 2015-04-11 (×5): qty 1

## 2015-04-11 MED ORDER — ONDANSETRON HCL 4 MG/2ML IJ SOLN: 4.0000 mg | Freq: Four times a day (QID) | INTRAMUSCULAR | Status: DC | PRN

## 2015-04-11 MED ORDER — SODIUM CHLORIDE 0.9% FLUSH: 3.0000 mL | INTRAVENOUS | Status: DC | PRN

## 2015-04-11 MED ORDER — CLOPIDOGREL BISULFATE 75 MG PO TABS
75.0000 mg | ORAL_TABLET | Freq: Every evening | ORAL | Status: DC
  Administered 2015-04-11 – 2015-04-12 (×2): 75 mg via ORAL
  Filled 2015-04-11 (×2): qty 1

## 2015-04-11 MED ORDER — WARFARIN SODIUM 3 MG PO TABS: 3.0000 mg | ORAL_TABLET | Freq: Every evening | ORAL | Status: DC

## 2015-04-11 MED ORDER — FUROSEMIDE 20 MG PO TABS
20.0000 mg | ORAL_TABLET | ORAL | Status: DC
  Administered 2015-04-11 – 2015-04-12 (×2): 20 mg via ORAL
  Filled 2015-04-11 (×2): qty 1

## 2015-04-11 MED ORDER — INSULIN ASPART 100 UNIT/ML ~~LOC~~ SOLN
0.0000 [IU] | Freq: Three times a day (TID) | SUBCUTANEOUS | Status: DC
  Administered 2015-04-11 (×2): 1 [IU] via SUBCUTANEOUS
  Administered 2015-04-12: 2 [IU] via SUBCUTANEOUS
  Filled 2015-04-11 (×2): qty 1
  Filled 2015-04-11: qty 2

## 2015-04-11 MED ORDER — METFORMIN HCL 500 MG PO TABS: 500.0000 mg | ORAL_TABLET | Freq: Two times a day (BID) | ORAL | Status: DC

## 2015-04-11 MED ORDER — ACETAMINOPHEN 500 MG PO TABS
1000.0000 mg | ORAL_TABLET | Freq: Once | ORAL | Status: AC
  Administered 2015-04-11: 1000 mg via ORAL
  Filled 2015-04-11: qty 2

## 2015-04-11 MED ORDER — ACETAMINOPHEN 650 MG RE SUPP: 650.0000 mg | Freq: Four times a day (QID) | RECTAL | Status: DC | PRN

## 2015-04-11 MED ORDER — GLIPIZIDE ER 5 MG PO TB24
5.0000 mg | ORAL_TABLET | ORAL | Status: DC
  Administered 2015-04-11 – 2015-04-13 (×3): 5 mg via ORAL
  Filled 2015-04-11 (×3): qty 1

## 2015-04-11 MED ORDER — FUROSEMIDE 10 MG/ML IJ SOLN
40.0000 mg | Freq: Two times a day (BID) | INTRAMUSCULAR | Status: DC
  Administered 2015-04-11 – 2015-04-12 (×2): 40 mg via INTRAVENOUS
  Filled 2015-04-11 (×3): qty 4

## 2015-04-11 MED ORDER — TEMAZEPAM 15 MG PO CAPS
15.0000 mg | ORAL_CAPSULE | Freq: Every day | ORAL | Status: DC
  Administered 2015-04-11 – 2015-04-12 (×2): 15 mg via ORAL
  Filled 2015-04-11 (×2): qty 1

## 2015-04-11 MED ORDER — ASPIRIN EC 81 MG PO TBEC
81.0000 mg | DELAYED_RELEASE_TABLET | Freq: Every day | ORAL | Status: DC
  Administered 2015-04-11 – 2015-04-13 (×3): 81 mg via ORAL
  Filled 2015-04-11 (×3): qty 1

## 2015-04-11 MED ORDER — SODIUM CHLORIDE 0.9 % IV SOLN: 250.0000 mL | INTRAVENOUS | Status: DC | PRN

## 2015-04-11 MED ORDER — SIMVASTATIN 40 MG PO TABS
40.0000 mg | ORAL_TABLET | Freq: Every evening | ORAL | Status: DC
  Administered 2015-04-11 – 2015-04-12 (×2): 40 mg via ORAL
  Filled 2015-04-11 (×2): qty 1

## 2015-04-11 MED ORDER — ACETAMINOPHEN 325 MG PO TABS
650.0000 mg | ORAL_TABLET | Freq: Four times a day (QID) | ORAL | Status: DC | PRN
  Administered 2015-04-12: 650 mg via ORAL
  Filled 2015-04-11: qty 2

## 2015-04-11 MED ORDER — SOTALOL HCL 80 MG PO TABS
80.0000 mg | ORAL_TABLET | Freq: Two times a day (BID) | ORAL | Status: DC
  Administered 2015-04-11 – 2015-04-13 (×5): 80 mg via ORAL
  Filled 2015-04-11 (×5): qty 1

## 2015-04-11 MED ORDER — TAMSULOSIN HCL 0.4 MG PO CAPS
0.4000 mg | ORAL_CAPSULE | Freq: Every morning | ORAL | Status: DC
  Administered 2015-04-11 – 2015-04-13 (×3): 0.4 mg via ORAL
  Filled 2015-04-11 (×3): qty 1

## 2015-04-11 MED ORDER — WARFARIN SODIUM 1 MG PO TABS: 1.0000 mg | ORAL_TABLET | Freq: Every evening | ORAL | Status: DC

## 2015-04-11 NOTE — Progress Notes (Signed)
Per MD Posey Pronto, hold metoprolol d/t low SBP

## 2015-04-11 NOTE — Consult Note (Signed)
ANTICOAGULATION CONSULT NOTE - Initial Consult  Pharmacy Consult for warfarin Indication: atrial fibrillation  Allergies  Allergen Reactions  . Adhesive [Tape] Rash  . Sulfa Antibiotics Rash  . Sulfamethoxazole Hives, Itching and Rash    Patient Measurements: Height: 5\' 10"  (177.8 cm) Weight: 184 lb 11.2 oz (83.779 kg) IBW/kg (Calculated) : 73 Heparin Dosing Weight:   Vital Signs: Temp: 96.5 F (35.8 C) (02/26 1053) Temp Source: Axillary (02/26 1053) BP: 123/68 mmHg (02/26 1053) Pulse Rate: 60 (02/26 1053)  Labs:  Recent Labs  04/09/15 0823 04/11/15 0812  HGB 12.5* 13.3  HCT 37.2* 39.3*  PLT 96* 144*  LABPROT 32.2* 38.0*  INR 3.21 4.00*  CREATININE 1.20 1.09  TROPONINI <0.03 <0.03    Estimated Creatinine Clearance: 57.7 mL/min (by C-G formula based on Cr of 1.09).   Medical History: Past Medical History  Diagnosis Date  . Coronary artery disease   . Hypertension   . Myocardial infarction ([place])   . Hypothyroidism   . Diabetes mellitus without complication ([place])   . GERD (gastroesophageal reflux disease)   . Arthritis   . Heart murmur   . Shortness of breath dyspnea   . Cancer ([place])     skin    Medications:  Scheduled:  . aspirin EC  81 mg Oral Daily  . clopidogrel  75 mg Oral QPM  . enalapril  5 mg Oral BID  . furosemide  40 mg Intravenous Q12H  . furosemide  20 mg Oral BH-q7a  . gabapentin  300 mg Oral BID  . glipiZIDE  5 mg Oral BH-q7a  . insulin aspart  0-9 Units Subcutaneous TID WC  . levothyroxine  137 mcg Oral QAC breakfast  . metFORMIN  500 mg Oral BID WC  . metoprolol  50 mg Oral TID  . pantoprazole  40 mg Oral Daily  . simvastatin  40 mg Oral QPM  . sodium chloride flush  3 mL Intravenous Q12H  . sotalol  80 mg Oral BID  . tamsulosin  0.4 mg Oral q morning - 10a  . temazepam  15 mg Oral QHS    Assessment: Pt is a 80 year old male with afib, on warfarin. Pharmacy consulted to dose. Home dose is 4mg  daily. Patient INR on admission  is supratherapeutic at 4.  Goal of Therapy:  INR 2-3 Monitor platelets by anticoagulation protocol: Yes   Plan:  Hold warfarin tonight. Recheck INR in the AM. Resume warfarin when INR is therapeutic.  Taaj Hurlbut D Ulyssa Walthour 04/11/2015,11:42 AM

## 2015-04-11 NOTE — ED Notes (Signed)
Patient arrived to ED POV c/o shortness of breath that started last PM, patient brought back to room 19 via wheelchair. Patient pale and in mild respiratory distress.

## 2015-04-11 NOTE — Consult Note (Signed)
[facility] Consultation Note  Patient ID: Derrick Cochran, MRN: [id number], DOB/AGE: [birth year] 80 y.o. Admit date: 04/11/2015   Date of Consult: 04/11/2015 Primary Physician: [name], MD Primary Cardiologist: Bluford Kaufmann  Chief Complaint:  Chief Complaint  Patient presents with  . Shortness of Breath   Reason for Consult: paroxysmal nonvalvular atrial fibrillation with acute on chronic systolic dysfunction congestive heart failure  HPI: 80 y.o. male with known chronic systolic dysfunction congestive heart failure status post previous coronary artery bypass graft and multiple myocardial infarction diabetes with complication and paroxysmal nonvalvular atrial fibrillation with new onset of significant atrial fibrillation with rapid ventricular rate having episode of acute on chronic systolic dysfunction with shortness of breath and electrical cardioversion and shocking with his defibrillator device. The patient was seen in the emergency room and was attended to without evidence of myocardial infarction and was placed on a higher dose of beta blocker for better heart rate control which was somewhat successful. The patient then ended up with significant acute on chronic systolic dysfunction heart failure with shortness of breath requiring admission to the hospital. With oxygenation and appropriate medication management including metoprolol and anticoagulation and furosemide the patient does breathing better at this time. The patient has had an elevated INR of 4 consistent with the recent antibiotics with patient recently having upper respiratory tract infection.  Past Medical History  Diagnosis Date  . Coronary artery disease   . Hypertension   . Myocardial infarction ([place])   . Hypothyroidism   . Diabetes mellitus without complication ([place])   . GERD (gastroesophageal reflux disease)   . Arthritis   . Heart murmur   . Shortness of breath dyspnea   . Cancer [facility])     skin       Surgical History:  Past Surgical History  Procedure Laterality Date  . Coronary artery bypass graft    . Cardiac catheterization    . Appendectomy    . Cardiac valve replacement      Bovine transcatheter heart valve  . Great toe arthrodesis, interphalangeal joint Left   . Pacemaker insertion N/A 07/02/2014    Procedure: INSERTION PACEMAKER/DUAL CHAMBER  INITIAL IMPLANT;  Surgeon: [name], MD;  Location: [facility] ORS;  Service: Cardiovascular;  Laterality: N/A;  . Electrophysiologic study N/A 08/06/2014    Procedure: Cardioversion;  Surgeon: [name], MD;  Location: [place] [facility];  Service: Cardiovascular;  Laterality: N/A;  . Electrophysiologic study N/A 06/18/2014    Procedure: CARDIOVERSION;  Surgeon: [name], MD;  Location: [facility] ORS;  Service: Cardiovascular;  Laterality: N/A;     Home Meds: Prior to Admission medications   Medication Sig Start Date End Date Taking? Authorizing Provider  acetaminophen (TYLENOL) 500 MG tablet Take 500 mg by mouth every 6 (six) hours as needed for mild pain, moderate pain or fever.    Yes Historical Provider, MD  clopidogrel (PLAVIX) 75 MG tablet Take 75 mg by mouth every evening.  06/30/14  Yes Historical Provider, MD  enalapril (VASOTEC) 5 MG tablet Take 5 mg by mouth 2 (two) times daily.    Yes Historical Provider, MD  furosemide (LASIX) 20 MG tablet Take 20 mg by mouth every morning.    Yes Historical Provider, MD  gabapentin (NEURONTIN) 300 MG capsule Take 300 mg by mouth 2 (two) times daily.    Yes Historical Provider, MD  glipiZIDE (GLUCOTROL XL) 5 MG 24 hr tablet Take 5 mg by mouth every morning. 01/21/15  Yes Historical  Provider, MD  levothyroxine (SYNTHROID, LEVOTHROID) 137 MCG tablet Take 137 mcg by mouth daily before breakfast.    Yes Historical Provider, MD  metFORMIN (GLUCOPHAGE) 500 MG tablet Take 500 mg by mouth 2 (two) times daily.    Yes Historical Provider, MD  metoprolol (LOPRESSOR) 50 MG tablet  Take 1 tablet (50 mg total) by mouth 3 (three) times daily. 04/09/15 04/08/16 Yes Daymon Larsen, MD  omeprazole (PRILOSEC) 20 MG capsule Take 20 mg by mouth every morning.    Yes Historical Provider, MD  simvastatin (ZOCOR) 40 MG tablet Take 40 mg by mouth every evening.    Yes Historical Provider, MD  sotalol (BETAPACE) 80 MG tablet Take 1 tablet (80 mg total) by mouth 2 (two) times daily. 07/04/14  Yes [name], MD  tamsulosin (FLOMAX) 0.4 MG CAPS capsule Take 0.4 mg by mouth every morning.    Yes Historical Provider, MD  temazepam (RESTORIL) 15 MG capsule Take 15 mg by mouth at bedtime.    Yes Historical Provider, MD  VIRTUSSIN A/C 100-10 MG/5ML syrup Take 5 mLs by mouth every 6 (six) hours as needed. For cough. 04/05/15  Yes Historical Provider, MD  warfarin (COUMADIN) 1 MG tablet Take 1 mg by mouth every evening. Pt takes with 3mg  tablet for a total dose of 4mg .   Yes Historical Provider, MD  warfarin (COUMADIN) 3 MG tablet Take 3 mg by mouth every evening. Pt takes with 1mg  tablet for a total dose of 4mg .   Yes Historical Provider, MD  cephALEXin (KEFLEX) 250 MG capsule Take 1 capsule (250 mg total) by mouth 4 (four) times daily. Patient not taking: Reported on 08/05/2014 07/03/14   [name], MD  sotalol (BETAPACE) 80 MG tablet Take 1 tablet (80 mg total) by mouth every 12 (twelve) hours. 07/03/14   [name], MD  sotalol (BETAPACE) 80 MG tablet Take 1 tablet (80 mg total) by mouth 2 (two) times daily. 07/03/14   [name], MD    Inpatient Medications:  . aspirin EC  81 mg Oral Daily  . clopidogrel  75 mg Oral QPM  . enalapril  5 mg Oral BID  . furosemide  40 mg Intravenous Q12H  . furosemide  20 mg Oral BH-q7a  . gabapentin  300 mg Oral BID  . glipiZIDE  5 mg Oral BH-q7a  . insulin aspart  0-9 Units Subcutaneous TID WC  . levothyroxine  137 mcg Oral QAC breakfast  . metFORMIN  500 mg Oral BID WC  . metoprolol  50 mg Oral TID  . pantoprazole  40 mg  Oral Daily  . simvastatin  40 mg Oral QPM  . sodium chloride flush  3 mL Intravenous Q12H  . sotalol  80 mg Oral BID  . tamsulosin  0.4 mg Oral q morning - 10a  . temazepam  15 mg Oral QHS      Allergies:  Allergies  Allergen Reactions  . Adhesive [Tape] Rash  . Sulfa Antibiotics Rash  . Sulfamethoxazole Hives, Itching and Rash    Social History   Social History  . Marital Status: Married    Spouse Name: N/A  . Number of Children: N/A  . Years of Education: N/A   Occupational History  . Not on file.   Social History Main Topics  . Smoking status: Former Smoker    Types: Cigarettes  . Smokeless tobacco: Former Systems developer    Types: Chew  . Alcohol Use: No  . Drug Use: No  .  Sexual Activity: Not on file   Other Topics Concern  . Not on file   Social History Narrative     Family History  Problem Relation Age of Onset  . Coronary artery disease    . Hypertension       Review of Systems Positive for cough and congestion with chest pain Negative for: General:  chills, fever, night sweats or weight changes.  Cardiovascular: PND orthopnea syncope dizziness  Dermatological skin lesions rashes Respiratory: Positive for Cough congestion Urologic: Frequent urination urination at night and hematuria Abdominal: negative for nausea, vomiting, diarrhea, bright red blood per rectum, melena, or hematemesis Neurologic: negative for visual changes, and/or hearing changes  All other systems reviewed and are otherwise negative except as noted above.  Labs:  Recent Labs  04/09/15 0823 04/11/15 0812 04/11/15 1101  TROPONINI <0.03 <0.03 0.03   Lab Results  Component Value Date   WBC 9.2 04/11/2015   HGB 13.3 04/11/2015   HCT 39.3* 04/11/2015   MCV 96.8 04/11/2015   PLT 144* 04/11/2015    Recent Labs Lab 04/11/15 0812  NA 139  K 4.9  CL 104  CO2 26  BUN 19  CREATININE 1.09  CALCIUM 8.9  PROT 7.6  BILITOT 1.1  ALKPHOS 55  ALT 35  AST 37  GLUCOSE 195*   No  results found for: CHOL, HDL, LDLCALC, TRIG No results found for: DDIMER  Radiology/Studies:  Dg Chest 2 View  04/09/2015  CLINICAL DATA:  Shortness of breath since this morning. Congestion. Productive cough. History of the flu. EXAM: CHEST  2 VIEW COMPARISON:  07/02/2014; 06/29/2014 FINDINGS: Grossly unchanged enlarged cardiac silhouette and mediastinal contours post median sternotomy, CABG and percutaneous aortic valve replacement. Stable positioning of support apparatus. The lungs remain hyperexpanded with flattening of the diaphragms and mild diffuse slightly nodular thickening of the pulmonary interstitium. There is chronic blunting of the bilateral costophrenic angles without definite pleural effusion. No pneumothorax evidence of edema. No acute osseus abnormalities. IMPRESSION: Hyperexpanded lungs and bronchitic change without definite superimposed acute cardiopulmonary disease. Specifically, no focal airspace opacities suggest pneumonia. Electronically Signed   By: Sandi Mariscal M.D.   On: 04/09/2015 09:09   Dg Chest Portable 1 View  04/11/2015  CLINICAL DATA:  Shortness of breath beginning last night. Mild respiratory distress. EXAM: PORTABLE CHEST 1 VIEW COMPARISON:  04/09/2015 FINDINGS: Prior CABG. Left pacer remains in place, unchanged. Cardiomegaly with interstitial prominence in the lungs, possibly interstitial edema. No visible effusions or acute bony abnormality. IMPRESSION: Cardiomegaly with vascular congestion and interstitial prominence, likely interstitial edema. Electronically Signed   By: Rolm Baptise M.D.   On: 04/11/2015 08:24    EKG: Atrial fibrillation with rapid ventricular rate and bundle branch block  Weights: Filed Weights   04/11/15 0758 04/11/15 1053  Weight: 191 lb (86.637 kg) 184 lb 11.2 oz (83.779 kg)     Physical Exam: Blood pressure 92/63, pulse 67, temperature 97.6 F (36.4 C), temperature source Oral, resp. rate 20, height 5\' 10"  (1.778 m), weight 184 lb  11.2 oz (83.779 kg), SpO2 94 %. Body mass index is 26.5 kg/(m^2). General: Well developed, well nourished, in no acute distress. Head eyes ears nose throat: Normocephalic, atraumatic, sclera non-icteric, no xanthomas, nares are without discharge. No apparent thyromegaly and/or mass  Lungs: Normal respiratory effort.  no wheezes, basilar rales, no rhonchi.  Heart: Irregular rate with normal S1 S2. 2+ apical murmur gallop, no rub, PMI is normal size and placement, carotid upstroke normal without  bruit, jugular venous pressure is normal Abdomen: Soft, non-tender, non-distended with normoactive bowel sounds. No hepatomegaly. No rebound/guarding. No obvious abdominal masses. Abdominal aorta is normal size without bruit Extremities: Trace edema. no cyanosis, no clubbing, no ulcers  Peripheral : 2+ bilateral upper extremity pulses, 2+ bilateral femoral pulses, 2+ bilateral dorsal pedal pulse Neuro: Alert and oriented. No facial asymmetry. No focal deficit. Moves all extremities spontaneously. Musculoskeletal: Normal muscle tone without kyphosis Psych:  Responds to questions appropriately with a normal affect.    Assessment: 80 year old male with known coronary artery disease status post myocardial infarction coronary artery bypass graft diabetes with complication with acute on chronic systolic dysfunction congestive heart failure secondary to paroxysmal nonvalvular atrial fibrillation with rapid ventricular rate without evidence of myocardial infarction  Plan: And. Continue higher dosage of metoprolol tartrate 50 mg 3 times per day for heart rate control of atrial fibrillation and possible spontaneous conversion to normal sinus rhythm 2. Continue anticoagulation for further risk reduction in stroke with atrial fibrillation 3. No further cardiac intervention or diagnostics due to no evidence of myocardial infarction 4. Furosemide for concerns of pulmonary edema and congestive heart failure 5. ACE  inhibitor for cardiomyopathy and hypertension control 6. Begin ambulation and further adjustments of medication management as necessary  Signed, Corey Skains M.D. [facility] 04/11/2015, 4:55 PM

## 2015-04-11 NOTE — H&P (Signed)
[facility] NAME: Derrick Cochran    MR#:  [id number]  DATE OF BIRTH:  1935-04-19  DATE OF ADMISSION:  04/11/2015  PRIMARY CARE PHYSICIAN: [name], MD   REQUESTING/REFERRING PHYSICIAN: Lavonia Drafts M.D.  CHIEF COMPLAINT:   Chief Complaint  Patient presents with  . Shortness of Breath    HISTORY OF PRESENT ILLNESS: Derrick Cochran  is a 80 y.o. male with a known history of  coronary artery disease, hypertension, diabetes type 2, GERD, atrial fibrillation who presents to the emergency room with complaint of shortness of breath. Patient reports that he started having flulike illness about a week ago. And was diagnosed with the flu and acute bronchitis. Was treated with Tamiflu and Z-Pak. His symptoms improved in terms of cough and congestion. However he states that on Friday he started feeling shocks from his pacemaker. He came to the ER his pacemaker was interrogated and apparently showed atrial fibrillation. H his metoprolol dose was increased. He was sent home. Patient reports that he continued to have shortness of breath and feeling of his chest being congested. He returns back to the emergency room and noted to have chest x-ray suggestive of CHF and his BMP is elevated. He denies any chest pains fevers.     PAST MEDICAL HISTORY:   Past Medical History  Diagnosis Date  . Coronary artery disease   . Hypertension   . Myocardial infarction ([place])   . Hypothyroidism   . Diabetes mellitus without complication ([place])   . GERD (gastroesophageal reflux disease)   . Arthritis   . Heart murmur   . Shortness of breath dyspnea   . Cancer ([place])     skin    PAST SURGICAL HISTORY: Past Surgical History  Procedure Laterality Date  . Coronary artery bypass graft    . Cardiac catheterization    . Appendectomy    . Cardiac valve replacement      Bovine transcatheter heart valve  . Great toe arthrodesis, interphalangeal joint  Left   . Pacemaker insertion N/A 07/02/2014    Procedure: INSERTION PACEMAKER/DUAL CHAMBER  INITIAL IMPLANT;  Surgeon: [name], MD;  Location: [facility] ORS;  Service: Cardiovascular;  Laterality: N/A;  . Electrophysiologic study N/A 08/06/2014    Procedure: Cardioversion;  Surgeon: [name], MD;  Location: [facility];  Service: Cardiovascular;  Laterality: N/A;  . Electrophysiologic study N/A 06/18/2014    Procedure: CARDIOVERSION;  Surgeon: [name], MD;  Location: [facility] ORS;  Service: Cardiovascular;  Laterality: N/A;    SOCIAL HISTORY:  Social History  Substance Use Topics  . Smoking status: Former Smoker    Types: Cigarettes  . Smokeless tobacco: Former Systems developer    Types: Chew  . Alcohol Use: No    FAMILY HISTORY:  Family History  Problem Relation Age of Onset  . Coronary artery disease    . Hypertension      DRUG ALLERGIES:  Allergies  Allergen Reactions  . Adhesive [Tape] Rash  . Sulfa Antibiotics Rash  . Sulfamethoxazole Hives, Itching and Rash    REVIEW OF SYSTEMS:   CONSTITUTIONAL: No fever, positive fatigue and weakness.  EYES: No blurred or double vision.  EARS, NOSE, AND THROAT: No tinnitus or ear pain.  RESPIRATORY: No cough, shortness of breath, wheezing or hemoptysis.  CARDIOVASCULAR: No chest pain, orthopnea, no edema. Complains of pacemaker firing GASTROINTESTINAL: No nausea, vomiting, diarrhea or abdominal pain.  GENITOURINARY: No dysuria, hematuria.  ENDOCRINE:  No polyuria, nocturia,  HEMATOLOGY: No anemia, easy bruising or bleeding SKIN: No rash or lesion. MUSCULOSKELETAL: No joint pain or arthritis.   NEUROLOGIC: No tingling, numbness, weakness.  PSYCHIATRY: No anxiety or depression.   MEDICATIONS AT HOME:  Prior to Admission medications   Medication Sig Start Date End Date Taking? Authorizing Provider  acetaminophen (TYLENOL) 500 MG tablet Take 500 mg by mouth every 6 (six) hours as needed for mild pain,  moderate pain or fever.    Yes Historical Provider, MD  clopidogrel (PLAVIX) 75 MG tablet Take 75 mg by mouth every evening.  06/30/14  Yes Historical Provider, MD  enalapril (VASOTEC) 5 MG tablet Take 5 mg by mouth 2 (two) times daily.    Yes Historical Provider, MD  furosemide (LASIX) 20 MG tablet Take 20 mg by mouth every morning.    Yes Historical Provider, MD  gabapentin (NEURONTIN) 300 MG capsule Take 300 mg by mouth 2 (two) times daily.    Yes Historical Provider, MD  glipiZIDE (GLUCOTROL XL) 5 MG 24 hr tablet Take 5 mg by mouth every morning. 01/21/15  Yes Historical Provider, MD  levothyroxine (SYNTHROID, LEVOTHROID) 137 MCG tablet Take 137 mcg by mouth daily before breakfast.    Yes Historical Provider, MD  metFORMIN (GLUCOPHAGE) 500 MG tablet Take 500 mg by mouth 2 (two) times daily.    Yes Historical Provider, MD  metoprolol (LOPRESSOR) 50 MG tablet Take 1 tablet (50 mg total) by mouth 3 (three) times daily. 04/09/15 04/08/16 Yes Daymon Larsen, MD  omeprazole (PRILOSEC) 20 MG capsule Take 20 mg by mouth every morning.    Yes Historical Provider, MD  simvastatin (ZOCOR) 40 MG tablet Take 40 mg by mouth every evening.    Yes Historical Provider, MD  sotalol (BETAPACE) 80 MG tablet Take 1 tablet (80 mg total) by mouth 2 (two) times daily. 07/04/14  Yes [name], MD  tamsulosin (FLOMAX) 0.4 MG CAPS capsule Take 0.4 mg by mouth every morning.    Yes Historical Provider, MD  temazepam (RESTORIL) 15 MG capsule Take 15 mg by mouth at bedtime.    Yes Historical Provider, MD  VIRTUSSIN A/C 100-10 MG/5ML syrup Take 5 mLs by mouth every 6 (six) hours as needed. For cough. 04/05/15  Yes Historical Provider, MD  warfarin (COUMADIN) 1 MG tablet Take 1 mg by mouth every evening. Pt takes with 3mg  tablet for a total dose of 4mg .   Yes Historical Provider, MD  warfarin (COUMADIN) 3 MG tablet Take 3 mg by mouth every evening. Pt takes with 1mg  tablet for a total dose of 4mg .   Yes Historical  Provider, MD  cephALEXin (KEFLEX) 250 MG capsule Take 1 capsule (250 mg total) by mouth 4 (four) times daily. Patient not taking: Reported on 08/05/2014 07/03/14   [name], MD  sotalol (BETAPACE) 80 MG tablet Take 1 tablet (80 mg total) by mouth every 12 (twelve) hours. 07/03/14   [name], MD  sotalol (BETAPACE) 80 MG tablet Take 1 tablet (80 mg total) by mouth 2 (two) times daily. 07/03/14   [name], MD      PHYSICAL EXAMINATION:   VITAL SIGNS: Blood pressure 130/83, pulse 42, temperature 97.7 F (36.5 C), temperature source Oral, resp. rate 29, height 5\' 10"  (1.778 m), weight 86.637 kg (191 lb), SpO2 95 %.  GENERAL:  80 y.o.-year-old patient lying in the bed with no acute distress.  EYES: Pupils equal, round, reactive to light and accommodation. No scleral icterus. Extraocular  muscles intact.  HEENT: Head atraumatic, normocephalic. Oropharynx and nasopharynx clear.  NECK:  Supple, no jugular venous distention. No thyroid enlargement, no tenderness.  LUNGS: Occasional crackles at the bases. No wheezing no rhonchi no sensory muscle usage  CARDIOVASCULAR: Irregularly irregular heart rhythm. No murmurs, rubs, or gallops.  ABDOMEN: Soft, nontender, nondistended. Bowel sounds present. No organomegaly or mass.  EXTREMITIES: No pedal edema, cyanosis, or clubbing.  NEUROLOGIC: Cranial nerves II through XII are intact. Muscle strength 5/5 in all extremities. Sensation intact. Gait not checked.  PSYCHIATRIC: The patient is alert and oriented x 3.  SKIN: No obvious rash, lesion, or ulcer.   LABORATORY PANEL:   CBC  Recent Labs Lab 04/09/15 0823 04/11/15 0812  WBC 7.2 9.2  HGB 12.5* 13.3  HCT 37.2* 39.3*  PLT 96* 144*  MCV 96.6 96.8  MCH 32.5 32.8  MCHC 33.6 33.9  RDW 13.0 13.3  LYMPHSABS 1.3  --   MONOABS 0.7  --   EOSABS 0.3  --   BASOSABS 0.0  --     ------------------------------------------------------------------------------------------------------------------  Chemistries   Recent Labs Lab 04/09/15 0823 04/11/15 0812  NA 140 139  K 3.9 4.9  CL 107 104  CO2 27 26  GLUCOSE 126* 195*  BUN 22* 19  CREATININE 1.20 1.09  CALCIUM 8.3* 8.9  MG 1.6*  --   AST  --  37  ALT  --  35  ALKPHOS  --  55  BILITOT  --  1.1   ------------------------------------------------------------------------------------------------------------------ estimated creatinine clearance is 57.7 mL/min (by C-G formula based on Cr of 1.09). ------------------------------------------------------------------------------------------------------------------ No results for input(s): TSH, T4TOTAL, T3FREE, THYROIDAB in the last 72 hours.  Invalid input(s): FREET3   Coagulation profile  Recent Labs Lab 04/09/15 0823 04/11/15 0812  INR 3.21 4.00*   ------------------------------------------------------------------------------------------------------------------- No results for input(s): DDIMER in the last 72 hours. -------------------------------------------------------------------------------------------------------------------  Cardiac Enzymes  Recent Labs Lab 04/09/15 0823 04/11/15 0812  TROPONINI <0.03 <0.03   ------------------------------------------------------------------------------------------------------------------ Invalid input(s): POCBNP  ---------------------------------------------------------------------------------------------------------------  Urinalysis    Component Value Date/Time   COLORURINE Straw 05/19/2014 1144   APPEARANCEUR Clear 05/19/2014 1144   LABSPEC 1.005 05/19/2014 1144   PHURINE 5.0 05/19/2014 1144   GLUCOSEU Negative 05/19/2014 1144   HGBUR 2+ 05/19/2014 1144   BILIRUBINUR Negative 05/19/2014 1144   KETONESUR Negative 05/19/2014 1144   PROTEINUR Negative 05/19/2014 1144   NITRITE Negative 05/19/2014  1144   LEUKOCYTESUR Negative 05/19/2014 1144     RADIOLOGY: Dg Chest Portable 1 View  04/11/2015  CLINICAL DATA:  Shortness of breath beginning last night. Mild respiratory distress. EXAM: PORTABLE CHEST 1 VIEW COMPARISON:  04/09/2015 FINDINGS: Prior CABG. Left pacer remains in place, unchanged. Cardiomegaly with interstitial prominence in the lungs, possibly interstitial edema. No visible effusions or acute bony abnormality. IMPRESSION: Cardiomegaly with vascular congestion and interstitial prominence, likely interstitial edema. Electronically Signed   By: Rolm Baptise M.D.   On: 04/11/2015 08:24    EKG: Orders placed or performed during the hospital encounter of 04/11/15  . ED EKG  . ED EKG  . ED EKG  . ED EKG    IMPRESSION AND PLAN: Patient is a 80 year old white male with coronary artery disease atrial fibrillation presents for shortness of breath  1. Acute CHF type unknown: Treated with IV Lasix and obtain echocardiogram of the heart , obtain cardiology consult  2. Atrial fibrillation with frequent PVCs, unclear if patient has a defibrillator he reports that his been firing last week. Cardiology consult will  be obtained his pacemaker may need to be interrogated. We'll check a magnesium level as well  And a TSH. Ration also on sotalol which will continue as well as Coumadin.  3. Coronary artery disease and CABG continue Plavix as taking at home follow-up cardiac enzymes  4. Diabetes type 2 continue glipizide and metformin I'll place him on sliding scale  5. Hyperlipidemia unspecified continue simvastatin  6. GERD continue PPI  7. Recent bronchitis finish course of Keflex 2 more days     All the records are reviewed and case discussed with ED provider. Management plans discussed with the patient, family and they are in agreement.  CODE STATUS: Code Status History    Date Active Date Inactive Code Status Order ID Comments User Context   07/02/2014  3:47 PM 07/04/2014  1:52  PM Full Code [id number]  [name], MD Inpatient    Advance Directive Documentation        Most Recent Value   Type of Advance Directive  Healthcare Power of [place], Living will   Pre-existing out of facility DNR order (yellow form or pink MOST form)     "MOST" Form in Place?         TOTAL TIME TAKING CARE OF THIS PATIENT: 55 minutes.    Dustin Flock M.D on 04/11/2015 at 9:45 AM  Between 7am to 6pm - Pager - [phone]  After 6pm go to [email or web address] - password [email or web address] [facility]  Office  [phone]  CC: Primary care physician; [name], MD

## 2015-04-11 NOTE — ED Notes (Signed)
Pt given graham crackers and diet ginger ale and peanut butter to eat..ok per dr Wynona Neat

## 2015-04-11 NOTE — ED Provider Notes (Signed)
[facility] Emergency Department Provider Note  ____________________________________________   I have reviewed the triage vital signs and the nursing notes.   HISTORY  Chief Complaint Shortness of Breath   HPI Derrick Cochran is a 80 y.o. male who presents with complaints of shortness of breath and heart palpitations. He notes that when he is sitting down or lying down he feels okay but whenever he tries to ambulate his heart rate shoots up and he becomes short of breath rapidly. Patient was seen in the ED 2 days ago and had his pacemaker interrogated which showed episodes rapid atrial fibrillation. He was started on metoprolol after discussion with his cardiologist apparently this has not been enough to control his heart rate. He denies chest pain. He denies shortness of breath when lying down or sitting down and resting. He denies extremity swelling.     Past Medical History  Diagnosis Date  . Coronary artery disease   . Hypertension   . Myocardial infarction ([place])   . Hypothyroidism   . Diabetes mellitus without complication ([place])   . GERD (gastroesophageal reflux disease)   . Arthritis   . Heart murmur   . Shortness of breath dyspnea   . Cancer [facility])     skin    Patient Active Problem List   Diagnosis Date Noted  . Sick sinus syndrome ([place]) 07/03/2014  . Atrial fibrillation with rapid ventricular response ([place]) 07/02/2014  . Atrial fibrillation ([place]) 06/17/2014    Past Surgical History  Procedure Laterality Date  . Coronary artery bypass graft    . Cardiac catheterization    . Appendectomy    . Cardiac valve replacement      Bovine transcatheter heart valve  . Great toe arthrodesis, interphalangeal joint Left   . Pacemaker insertion N/A 07/02/2014    Procedure: INSERTION PACEMAKER/DUAL CHAMBER  INITIAL IMPLANT;  Surgeon: [name], MD;  Location: [facility] ORS;  Service: Cardiovascular;  Laterality: N/A;  . Electrophysiologic study N/A  08/06/2014    Procedure: Cardioversion;  Surgeon: [name], MD;  Location: [place] [facility];  Service: Cardiovascular;  Laterality: N/A;  . Electrophysiologic study N/A 06/18/2014    Procedure: CARDIOVERSION;  Surgeon: [name], MD;  Location: [facility] ORS;  Service: Cardiovascular;  Laterality: N/A;    Current Outpatient Rx  Name  Route  Sig  Dispense  Refill  . acetaminophen (TYLENOL) 500 MG tablet   Oral   Take 500 mg by mouth every 6 (six) hours as needed.         Marland Kitchen aspirin EC 81 MG tablet   Oral   Take 81 mg by mouth daily.         . cephALEXin (KEFLEX) 250 MG capsule   Oral   Take 1 capsule (250 mg total) by mouth 4 (four) times daily. Patient not taking: Reported on 08/05/2014   28 capsule   0   . clopidogrel (PLAVIX) 75 MG tablet   Oral   Take 37.5 mg by mouth daily.         . enalapril (VASOTEC) 5 MG tablet   Oral   Take 5 mg by mouth daily.         . furosemide (LASIX) 20 MG tablet   Oral   Take 20 mg by mouth daily.          Marland Kitchen gabapentin (NEURONTIN) 300 MG capsule   Oral   Take 300 mg by mouth 2 (two) times daily.          Marland Kitchen  glipiZIDE (GLUCOTROL) 5 MG tablet   Oral   Take 5 mg by mouth 2 (two) times daily.          Marland Kitchen levothyroxine (SYNTHROID, LEVOTHROID) 137 MCG tablet   Oral   Take 137 mcg by mouth daily.          . metFORMIN (GLUCOPHAGE) 500 MG tablet   Oral   Take 500 mg by mouth 2 (two) times daily.          . metoprolol (LOPRESSOR) 50 MG tablet   Oral   Take 1 tablet (50 mg total) by mouth 3 (three) times daily.   90 tablet   0   . omeprazole (PRILOSEC) 20 MG capsule   Oral   Take 20 mg by mouth daily.         . simvastatin (ZOCOR) 40 MG tablet   Oral   Take 40 mg by mouth daily.         . sotalol (BETAPACE) 80 MG tablet   Oral   Take 1 tablet (80 mg total) by mouth every 12 (twelve) hours.   60 tablet   6   . sotalol (BETAPACE) 80 MG tablet   Oral   Take 1 tablet (80 mg total) by mouth 2  (two) times daily.   60 tablet   5   . sotalol (BETAPACE) 80 MG tablet   Oral   Take 1 tablet (80 mg total) by mouth 2 (two) times daily.   60 tablet   5   . tamsulosin (FLOMAX) 0.4 MG CAPS capsule   Oral   Take 0.4 mg by mouth every morning.          . temazepam (RESTORIL) 15 MG capsule   Oral   Take 15 mg by mouth at bedtime as needed for sleep.         Marland Kitchen warfarin (COUMADIN) 1 MG tablet   Oral   Take 1 mg by mouth daily. Pt takes with 3mg  tablet for a total dose of 4mg .         . warfarin (COUMADIN) 3 MG tablet   Oral   Take 3 mg by mouth daily. Pt takes with 1mg  tablet for a total dose of 4mg .           Allergies Adhesive; Sulfa antibiotics; and Sulfamethoxazole  No family history on file.  Social History Social History  Substance Use Topics  . Smoking status: Former Smoker    Types: Cigarettes  . Smokeless tobacco: Former Systems developer    Types: Chew  . Alcohol Use: No    Review of Systems  Constitutional: Negative for fever. Eyes: Negative for visual changes. ENT: Negative for neck pain Cardiovascular: Negative for chest pain. Palpitations when ambulating Respiratory: Shortness of breath when ambulating Gastrointestinal: Negative for abdominal pain, vomiting Genitourinary: Negative for dysuria. Musculoskeletal: Chronic back pain Skin: Negative for rash. Neurological: Negative for  focal weakness Psychiatric: No anxiety    ____________________________________________   PHYSICAL EXAM:  VITAL SIGNS: ED Triage Vitals  Enc Vitals Group     BP 04/11/15 0758 126/82 mmHg     Pulse Rate 04/11/15 0758 83     Resp --      Temp 04/11/15 0758 97.7 F (36.5 C)     Temp Source 04/11/15 0758 Oral     SpO2 04/11/15 0758 95 %     Weight 04/11/15 0758 191 lb (86.637 kg)     Height 04/11/15 0758 5\' 10"  (1.778 m)  Head Cir --      Peak Flow --      Pain Score 04/11/15 0759 0     Pain Loc --      Pain Edu? --      Excl. in [place]? --       Constitutional: Alert and oriented. No acute distress. Eyes: Conjunctivae are normal.  ENT   Head: Normocephalic and atraumatic.   Mouth/Throat: Mucous membranes are moist. Cardiovascular: Irregular rhythm, normal rate. Normal and symmetric distal pulses are present in all extremities.  Respiratory: Normal respiratory effort without tachypnea nor retractions. Breath sounds are clear and equal bilaterally.  Gastrointestinal: Soft and non-tender in all quadrants. No distention. There is no CVA tenderness. Genitourinary: deferred Musculoskeletal: Nontender with normal range of motion in all extremities. No lower extremity tenderness nor edema. Neurologic:  Normal speech and language. No gross focal neurologic deficits are appreciated. Skin:  Skin is warm, dry and intact. No rash noted. Psychiatric: Mood and affect are normal. Patient exhibits appropriate insight and judgment.  ____________________________________________    LABS (pertinent positives/negatives)  Labs Reviewed  CBC  COMPREHENSIVE METABOLIC PANEL  TROPONIN I  BRAIN NATRIURETIC PEPTIDE  PROTIME-INR    ____________________________________________   EKG  ED ECG REPORT I, Lavonia Drafts, the attending physician, personally viewed and interpreted this ECG.   Date: 04/11/2015  EKG Time: 8:01 AM  Rate: 87  Rhythm:  atrial fibrillation, V paced complexes  Axis: Normal  Intervals:left bundle branch block  ST&T Change: Nonspecific   ____________________________________________    RADIOLOGY I have personally reviewed any xrays that were ordered on this patient: Mild interstitial edema  ____________________________________________   PROCEDURES  Procedure(s) performed: none  Critical Care performed: none  ____________________________________________   INITIAL IMPRESSION / ASSESSMENT AND PLAN / ED COURSE  Pertinent labs & imaging results that were available during my care of the patient were  reviewed by me and considered in my medical decision making (see chart for details).  Patient presents with complaints of palpitations and shortness of breath with ambulating. This is likely related to atrial fibrillation seen on his pacemaker interrogation 2 days ago. Outpatient metoprolol was attempted but given that he is back with similar symptoms I suspect admission will be required. Patient is on a monitor his EKG is unchanged from prior we are awaiting labs and chest x-ray prior to discussing with cardiology   Patient's BNP has tripled over the last 2 days and his chest x-ray is consistent with interstitial edema. We will give Lasix IV in the emergency department. I discussed this case with Dr. Nehemiah Massed who agrees with management and will see the patient in the hospital  ____________________________________________   FINAL CLINICAL IMPRESSION(S) / ED DIAGNOSES  Final diagnoses:  Acute pulmonary edema [facility])  Palpitations     Lavonia Drafts, MD 04/11/15 [phone]

## 2015-04-11 NOTE — Progress Notes (Signed)
Patient's flu swab was negative

## 2015-04-12 LAB — BASIC METABOLIC PANEL
ANION GAP: 5 (ref 5–15)
BUN: 26 mg/dL — ABNORMAL HIGH (ref 6–20)
CALCIUM: 8.4 mg/dL — AB (ref 8.9–10.3)
CO2: 32 mmol/L (ref 22–32)
Chloride: 104 mmol/L (ref 101–111)
Creatinine, Ser: 1.45 mg/dL — ABNORMAL HIGH (ref 0.61–1.24)
GFR calc Af Amer: 52 mL/min — ABNORMAL LOW (ref >60)
GFR calc non Af Amer: 45 mL/min — ABNORMAL LOW (ref >60)
GLUCOSE: 90 mg/dL (ref 65–99)
POTASSIUM: 4.5 mmol/L (ref 3.5–5.1)
Sodium: 141 mmol/L (ref 135–145)

## 2015-04-12 LAB — CBC
HEMATOCRIT: 35.3 % — AB (ref 40.0–52.0)
Hemoglobin: 12.1 g/dL — ABNORMAL LOW (ref 13.0–18.0)
MCH: 33.3 pg (ref 26.0–34.0)
MCHC: 34.2 g/dL (ref 32.0–36.0)
MCV: 97.2 fL (ref 80.0–100.0)
Platelets: 99 10*3/uL — ABNORMAL LOW (ref 150–440)
RBC: 3.64 MIL/uL — ABNORMAL LOW (ref 4.40–5.90)
RDW: 13 % (ref 11.5–14.5)
WBC: 6.4 10*3/uL (ref 3.8–10.6)

## 2015-04-12 LAB — PROTIME-INR
INR: 4
Prothrombin Time: 38 seconds — ABNORMAL HIGH (ref 11.4–15.0)

## 2015-04-12 LAB — GLUCOSE, CAPILLARY
GLUCOSE-CAPILLARY: 103 mg/dL — AB (ref 65–99)
GLUCOSE-CAPILLARY: 166 mg/dL — AB (ref 65–99)
GLUCOSE-CAPILLARY: 117 mg/dL — AB (ref 65–99)
Glucose-Capillary: 107 mg/dL — ABNORMAL HIGH (ref 65–99)

## 2015-04-12 MED ORDER — FUROSEMIDE 40 MG PO TABS
40.0000 mg | ORAL_TABLET | Freq: Two times a day (BID) | ORAL | Status: DC
  Administered 2015-04-12 (×2): 40 mg via ORAL
  Filled 2015-04-12 (×2): qty 1

## 2015-04-12 NOTE — Progress Notes (Signed)
[facility]                                                                                                                                                                                            Patient Demographics   Derrick Cochran, is a 80 y.o. male, DOB - 1935-07-30, [id number]  Admit date - 04/11/2015   Admitting Physician Dustin Flock, MD  Outpatient Primary MD for the patient is SPARKS,JEFFREY D, MD   LOS - 1  Subjective: Patient complains of persistent shortness of breath with exertion. Denies any chest pains or palpitations     Review of Systems:   CONSTITUTIONAL: No documented fever. No fatigue, weakness. No weight gain, no weight loss.  EYES: No blurry or double vision.  ENT: No tinnitus. No postnasal drip. No redness of the oropharynx.  RESPIRATORY: No cough, no wheeze, no hemoptysis. Positive dyspnea.  CARDIOVASCULAR: No chest pain. No orthopnea. No palpitations. No syncope.  GASTROINTESTINAL: No nausea, no vomiting or diarrhea. No abdominal pain. No melena or hematochezia.  GENITOURINARY: No dysuria or hematuria.  ENDOCRINE: No polyuria or nocturia. No heat or cold intolerance.  HEMATOLOGY: No anemia. No bruising. No bleeding.  INTEGUMENTARY: No rashes. No lesions.  MUSCULOSKELETAL: No arthritis. No swelling. No gout.  NEUROLOGIC: No numbness, tingling, or ataxia. No seizure-type activity.  PSYCHIATRIC: No anxiety. No insomnia. No ADD.    Vitals:   Filed Vitals:   04/11/15 1627 04/11/15 1944 04/12/15 0547 04/12/15 0757  BP: 92/63 111/66 103/61 118/59  Pulse: 67 63 60   Temp: 97.6 F (36.4 C) 98.2 F (36.8 C) 98.1 F (36.7 C)   TempSrc: Oral Oral Oral   Resp: 20 23 19    Height:      Weight:      SpO2: 94% 97% 95%     Wt Readings from Last 3 Encounters:  04/11/15 83.779 kg (184 lb 11.2 oz)  04/09/15 86.183 kg (190 lb)  08/06/14 85.73 kg (189 lb)     Intake/Output Summary (Last 24 hours) at  04/12/15 0829 Last data filed at 04/12/15 0758  Gross per 24 hour  Intake    480 ml  Output   3300 ml  Net  -2820 ml    Physical Exam:   GENERAL: Pleasant-appearing in no apparent distress.  HEAD, EYES, EARS, NOSE AND THROAT: Atraumatic, normocephalic. Extraocular muscles are intact. Pupils equal and reactive to light. Sclerae anicteric. No conjunctival injection. No oro-pharyngeal erythema.  NECK: Supple. There is no jugular venous distention. No bruits, no lymphadenopathy, no thyromegaly.  HEART: Irregularly irregular rhythm. No murmurs, no rubs,  no clicks.  LUNGS: Clear to auscultation bilaterally. No rales or rhonchi. No wheezes.  ABDOMEN: Soft, flat, nontender, nondistended. Has good bowel sounds. No hepatosplenomegaly appreciated.  EXTREMITIES: No evidence of any cyanosis, clubbing, or peripheral edema.  +2 pedal and radial pulses bilaterally.  NEUROLOGIC: The patient is alert, awake, and oriented x3 with no focal motor or sensory deficits appreciated bilaterally.  SKIN: Moist and warm with no rashes appreciated.  Psych: Not anxious, depressed LN: No inguinal LN enlargement    Antibiotics   Anti-infectives    None      Medications   Scheduled Meds: . aspirin EC  81 mg Oral Daily  . clopidogrel  75 mg Oral QPM  . enalapril  5 mg Oral BID  . furosemide  40 mg Oral BID  . gabapentin  300 mg Oral BID  . glipiZIDE  5 mg Oral BH-q7a  . insulin aspart  0-9 Units Subcutaneous TID WC  . levothyroxine  137 mcg Oral QAC breakfast  . metFORMIN  500 mg Oral BID WC  . metoprolol  50 mg Oral TID  . pantoprazole  40 mg Oral Daily  . simvastatin  40 mg Oral QPM  . sodium chloride flush  3 mL Intravenous Q12H  . sotalol  80 mg Oral BID  . tamsulosin  0.4 mg Oral q morning - 10a  . temazepam  15 mg Oral QHS   Continuous Infusions:  PRN Meds:.sodium chloride, acetaminophen **OR** acetaminophen, guaiFENesin-codeine, ondansetron **OR** ondansetron (ZOFRAN) IV, sodium chloride  flush   Data Review:   Micro Results No results found for this or any previous visit (from the past 240 hour(s)).  Radiology Reports Dg Chest 2 View  04/09/2015  CLINICAL DATA:  Shortness of breath since this morning. Congestion. Productive cough. History of the flu. EXAM: CHEST  2 VIEW COMPARISON:  07/02/2014; 06/29/2014 FINDINGS: Grossly unchanged enlarged cardiac silhouette and mediastinal contours post median sternotomy, CABG and percutaneous aortic valve replacement. Stable positioning of support apparatus. The lungs remain hyperexpanded with flattening of the diaphragms and mild diffuse slightly nodular thickening of the pulmonary interstitium. There is chronic blunting of the bilateral costophrenic angles without definite pleural effusion. No pneumothorax evidence of edema. No acute osseus abnormalities. IMPRESSION: Hyperexpanded lungs and bronchitic change without definite superimposed acute cardiopulmonary disease. Specifically, no focal airspace opacities suggest pneumonia. Electronically Signed   By: Sandi Mariscal M.D.   On: 04/09/2015 09:09   Dg Chest Portable 1 View  04/11/2015  CLINICAL DATA:  Shortness of breath beginning last night. Mild respiratory distress. EXAM: PORTABLE CHEST 1 VIEW COMPARISON:  04/09/2015 FINDINGS: Prior CABG. Left pacer remains in place, unchanged. Cardiomegaly with interstitial prominence in the lungs, possibly interstitial edema. No visible effusions or acute bony abnormality. IMPRESSION: Cardiomegaly with vascular congestion and interstitial prominence, likely interstitial edema. Electronically Signed   By: Rolm Baptise M.D.   On: 04/11/2015 08:24     CBC  Recent Labs Lab 04/09/15 0823 04/11/15 0812 04/12/15 0457  WBC 7.2 9.2 6.4  HGB 12.5* 13.3 12.1*  HCT 37.2* 39.3* 35.3*  PLT 96* 144* 99*  MCV 96.6 96.8 97.2  MCH 32.5 32.8 33.3  MCHC 33.6 33.9 34.2  RDW 13.0 13.3 13.0  LYMPHSABS 1.3  --   --   MONOABS 0.7  --   --   EOSABS 0.3  --   --    BASOSABS 0.0  --   --     Chemistries   Recent Labs Lab 04/09/15 [phone]  04/11/15 0812 04/12/15 0457  NA 140 139 141  K 3.9 4.9 4.5  CL 107 104 104  CO2 27 26 32  GLUCOSE 126* 195* 90  BUN 22* 19 26*  CREATININE 1.20 1.09 1.45*  CALCIUM 8.3* 8.9 8.4*  MG 1.6* 1.5*  --   AST  --  37  --   ALT  --  35  --   ALKPHOS  --  55  --   BILITOT  --  1.1  --    ------------------------------------------------------------------------------------------------------------------ estimated creatinine clearance is 43.4 mL/min (by C-G formula based on Cr of 1.45). ------------------------------------------------------------------------------------------------------------------ No results for input(s): HGBA1C in the last 72 hours. ------------------------------------------------------------------------------------------------------------------ No results for input(s): CHOL, HDL, LDLCALC, TRIG, CHOLHDL, LDLDIRECT in the last 72 hours. ------------------------------------------------------------------------------------------------------------------  Recent Labs  04/11/15 1101  TSH 2.947   ------------------------------------------------------------------------------------------------------------------ No results for input(s): VITAMINB12, FOLATE, FERRITIN, TIBC, IRON, RETICCTPCT in the last 72 hours.  Coagulation profile  Recent Labs Lab 04/09/15 0823 04/11/15 0812 04/12/15 0457  INR 3.21 4.00* 4.00*    No results for input(s): DDIMER in the last 72 hours.  Cardiac Enzymes  Recent Labs Lab 04/11/15 1101 04/11/15 1608 04/11/15 2317  TROPONINI 0.03 <0.03 <0.03   ------------------------------------------------------------------------------------------------------------------ Invalid input(s): POCBNP    Assessment & Plan   1. Acute systolic CHF:  Echo with significant systolic dysfunction. Patient's creatinine is increasing I will discontinue his IV Lasix put him on oral  Lasix. Continue metoprolol and enalapril   2. Atrial fibrillation with frequent PVCs, overnight no further arrhythmias noted continue metoprolol Due to INR being elevated Coumadin is currently being held.   3. Coronary artery disease and CABG continue Plavix as taking at home follow-up cardiac enzymes  4. Diabetes type 2 continue glipizide and metformin and sliding scale if creatinine continues to increase will have to stop metformin  5. Hyperlipidemia unspecified continue simvastatin  6. GERD continue PPI  7. Recent bronchitis  finished antibiotics     Code Status Orders        Start     Ordered   04/11/15 1023  Full code   Continuous     04/11/15 1022    Code Status History    Date Active Date Inactive Code Status Order ID Comments User Context   07/02/2014  3:47 PM 07/04/2014  1:52 PM Full Code [id number]  [name], MD Inpatient    Advance Directive Documentation        Most Recent Value   Type of Advance Directive  Living will   Pre-existing out of facility DNR order (yellow form or pink MOST form)     "MOST" Form in Place?             Consults  cardiology DVT Prophylaxis  Coumadin  Lab Results  Component Value Date   PLT 99* 04/12/2015     Time Spent in minutes  35 minutes  Greater than 50% of time spent in care coordination and counseling patient regarding the condition and plan of care. Patient and wife updated on his condition   Dustin Flock M.D on 04/12/2015 at 8:29 AM  Between 7am to 6pm - Pager - [phone]  After 6pm go to [email or web address] - password [email or web address] [facility]   Office  [phone]

## 2015-04-12 NOTE — Progress Notes (Signed)
Initial appointment scheduled at the [facility] Failure Clinic on April 23, 2015 at 10:00am. Thank you.

## 2015-04-12 NOTE — Care Management (Signed)
Admitted with congestive heart failure. Presents from home where he lives with his wife.  He is independent in his alds and no issues accessing medical care, obtaining meds or transportation.  Patient is ambulating around the nursing station without difficulty on room air.  lasix has been changed to oral due to decline in renal function.  An appointment has been made at the [facility].  Care team has not identified any discharge needs.

## 2015-04-12 NOTE — Consult Note (Signed)
ANTICOAGULATION CONSULT NOTE - Initial Consult  Pharmacy Consult for warfarin Indication: atrial fibrillation  Allergies  Allergen Reactions  . Adhesive [Tape] Rash  . Sulfa Antibiotics Rash  . Sulfamethoxazole Hives, Itching and Rash    Patient Measurements: Height: 5\' 10"  (177.8 cm) Weight: 184 lb 11.2 oz (83.779 kg) IBW/kg (Calculated) : 73 Heparin Dosing Weight:   Vital Signs: Temp: 98.3 F (36.8 C) (02/27 1128) Temp Source: Oral (02/27 0547) BP: 112/71 mmHg (02/27 1128) Pulse Rate: 56 (02/27 1128)  Labs:  Recent Labs  04/11/15 0812 04/11/15 1101 04/11/15 1608 04/11/15 2317 04/12/15 0457  HGB 13.3  --   --   --  12.1*  HCT 39.3*  --   --   --  35.3*  PLT 144*  --   --   --  99*  LABPROT 38.0*  --   --   --  38.0*  INR 4.00*  --   --   --  4.00*  CREATININE 1.09  --   --   --  1.45*  TROPONINI <0.03 0.03 <0.03 <0.03  --     Estimated Creatinine Clearance: 43.4 mL/min (by C-G formula based on Cr of 1.45).   Medical History: Past Medical History  Diagnosis Date  . Coronary artery disease   . Hypertension   . Myocardial infarction ([place])   . Hypothyroidism   . Diabetes mellitus without complication ([place])   . GERD (gastroesophageal reflux disease)   . Arthritis   . Heart murmur   . Shortness of breath dyspnea   . Cancer ([place])     skin    Medications:  Scheduled:  . aspirin EC  81 mg Oral Daily  . clopidogrel  75 mg Oral QPM  . enalapril  5 mg Oral BID  . furosemide  40 mg Oral BID  . gabapentin  300 mg Oral BID  . glipiZIDE  5 mg Oral BH-q7a  . insulin aspart  0-9 Units Subcutaneous TID WC  . levothyroxine  137 mcg Oral QAC breakfast  . metFORMIN  500 mg Oral BID WC  . metoprolol  50 mg Oral TID  . pantoprazole  40 mg Oral Daily  . simvastatin  40 mg Oral QPM  . sodium chloride flush  3 mL Intravenous Q12H  . sotalol  80 mg Oral BID  . tamsulosin  0.4 mg Oral q morning - 10a  . temazepam  15 mg Oral QHS    Assessment: Pt is a 80 year  old male with afib, on warfarin. Pharmacy consulted to dose. Home dose is 4mg  daily. Patient INR is supratherapeutic at 4. Patient recently completed a round of antibiotics.  2/26 INR: 4.0, no warfarin 2/27 INR: 4.0, no warfarin  Goal of Therapy:  INR 2-3 Monitor platelets by anticoagulation protocol: Yes   Plan:  Hold warfarin tonight. Recheck INR in the AM. Resume warfarin when INR is therapeutic.  Paulina Fusi, PharmD, BCPS 04/12/2015 12:02 PM

## 2015-04-12 NOTE — Progress Notes (Signed)
[facility] Encounter Note  Patient: Derrick Cochran / Admit Date: 04/11/2015 / Date of Encounter: 04/12/2015, 7:56 AM   Subjective: Patient has shortness of breath last night especially going to the bathroom. No evidence of overt congestive heart failure today. The patient has had some continued irregular heart beat rate no evidence of myocardial infarction  Review of Systems: Positive for: Shortness of breath Negative for: Vision change, hearing change, syncope, dizziness, nausea, vomiting,diarrhea, bloody stool, stomach pain, cough, congestion, diaphoresis, urinary frequency, urinary pain,skin lesions, skin rashes Others previously listed  Objective: Telemetry: Atrial fibrillation with controlled ventricular rate and ventricular pacing Physical Exam: Blood pressure 103/61, pulse 60, temperature 98.1 F (36.7 C), temperature source Oral, resp. rate 19, height 5\' 10"  (1.778 m), weight 184 lb 11.2 oz (83.779 kg), SpO2 95 %. Body mass index is 26.5 kg/(m^2). General: Well developed, well nourished, in no acute distress. Head: Normocephalic, atraumatic, sclera non-icteric, no xanthomas, nares are without discharge. Neck: No apparent masses Lungs: Normal respirations with no wheezes, no rhonchi, no rales , no crackles   Heart: irregular rate and rhythm, normal S1 S2, no murmur, no rub, no gallop, PMI is normal size and placement, carotid upstroke normal without bruit, jugular venous pressure normal Abdomen: Soft, non-tender, non-distended with normoactive bowel sounds. No hepatosplenomegaly. Abdominal aorta is normal size without bruit Extremities: Trace edema, no clubbing, no cyanosis, no ulcers,  Peripheral: 2+ radial, 2+ femoral, 2+ dorsal pedal pulses Neuro: Alert and oriented. Moves all extremities spontaneously. Psych:  Responds to questions appropriately with a normal affect.   Intake/Output Summary (Last 24 hours) at 04/12/15 0756 Last data filed at 04/12/15  0737  Gross per 24 hour  Intake    480 ml  Output   2975 ml  Net  -2495 ml    Inpatient Medications:  . aspirin EC  81 mg Oral Daily  . clopidogrel  75 mg Oral QPM  . enalapril  5 mg Oral BID  . furosemide  40 mg Intravenous Q12H  . furosemide  20 mg Oral BH-q7a  . gabapentin  300 mg Oral BID  . glipiZIDE  5 mg Oral BH-q7a  . insulin aspart  0-9 Units Subcutaneous TID WC  . levothyroxine  137 mcg Oral QAC breakfast  . metFORMIN  500 mg Oral BID WC  . metoprolol  50 mg Oral TID  . pantoprazole  40 mg Oral Daily  . simvastatin  40 mg Oral QPM  . sodium chloride flush  3 mL Intravenous Q12H  . sotalol  80 mg Oral BID  . tamsulosin  0.4 mg Oral q morning - 10a  . temazepam  15 mg Oral QHS   Infusions:    Labs:  Recent Labs  04/09/15 0823 04/11/15 0812 04/12/15 0457  NA 140 139 141  K 3.9 4.9 4.5  CL 107 104 104  CO2 27 26 32  GLUCOSE 126* 195* 90  BUN 22* 19 26*  CREATININE 1.20 1.09 1.45*  CALCIUM 8.3* 8.9 8.4*  MG 1.6* 1.5*  --     Recent Labs  04/11/15 0812  AST 37  ALT 35  ALKPHOS 55  BILITOT 1.1  PROT 7.6  ALBUMIN 3.6    Recent Labs  04/09/15 0823 04/11/15 0812 04/12/15 0457  WBC 7.2 9.2 6.4  NEUTROABS 4.8  --   --   HGB 12.5* 13.3 12.1*  HCT 37.2* 39.3* 35.3*  MCV 96.6 96.8 97.2  PLT 96* 144* 99*    Recent  Labs  04/11/15 0812 04/11/15 1101 04/11/15 1608 04/11/15 2317  TROPONINI <0.03 0.03 <0.03 <0.03   Invalid input(s): POCBNP No results for input(s): HGBA1C in the last 72 hours.   Weights: Filed Weights   04/11/15 0758 04/11/15 1053  Weight: 191 lb (86.637 kg) 184 lb 11.2 oz (83.779 kg)     Radiology/Studies:  Dg Chest 2 View  04/09/2015  CLINICAL DATA:  Shortness of breath since this morning. Congestion. Productive cough. History of the flu. EXAM: CHEST  2 VIEW COMPARISON:  07/02/2014; 06/29/2014 FINDINGS: Grossly unchanged enlarged cardiac silhouette and mediastinal contours post median sternotomy, CABG and percutaneous  aortic valve replacement. Stable positioning of support apparatus. The lungs remain hyperexpanded with flattening of the diaphragms and mild diffuse slightly nodular thickening of the pulmonary interstitium. There is chronic blunting of the bilateral costophrenic angles without definite pleural effusion. No pneumothorax evidence of edema. No acute osseus abnormalities. IMPRESSION: Hyperexpanded lungs and bronchitic change without definite superimposed acute cardiopulmonary disease. Specifically, no focal airspace opacities suggest pneumonia. Electronically Signed   By: Sandi Mariscal M.D.   On: 04/09/2015 09:09   Dg Chest Portable 1 View  04/11/2015  CLINICAL DATA:  Shortness of breath beginning last night. Mild respiratory distress. EXAM: PORTABLE CHEST 1 VIEW COMPARISON:  04/09/2015 FINDINGS: Prior CABG. Left pacer remains in place, unchanged. Cardiomegaly with interstitial prominence in the lungs, possibly interstitial edema. No visible effusions or acute bony abnormality. IMPRESSION: Cardiomegaly with vascular congestion and interstitial prominence, likely interstitial edema. Electronically Signed   By: Rolm Baptise M.D.   On: 04/11/2015 08:24     Assessment and Recommendation  80 y.o. male with known chronic systolic dysfunction congestive heart failure with atrial fibrillation with rapid ventricular rate now more controlled on adjustments of medication management after electrical cardioversion shocks and no current evidence of myocardial infarction improving today but still having shortness of breath 1. Patient will continue current dosage of metoprolol for heart rate control of atrial fibrillation 2. Continue anticoagulation with a goal INR between 2 and 3 3. No further cardiac diagnostics necessary at this time 4. Begin ambulation and follow for any further significant symptoms and need for adjustments of medication management with possible discharge to home today with follow-up this week with  further outpatient adjustments  Signed, Serafina Royals M.D. FACC

## 2015-04-12 NOTE — Progress Notes (Signed)
Dr. Marcille Blanco, aware of INR, 4.0, no new orders.

## 2015-04-12 NOTE — Progress Notes (Signed)
Lab called with critical lab INR 4.0, prime doc paged awaiting return call.

## 2015-04-13 LAB — BASIC METABOLIC PANEL
ANION GAP: 11 (ref 5–15)
BUN: 42 mg/dL — ABNORMAL HIGH (ref 6–20)
CO2: 31 mmol/L (ref 22–32)
Calcium: 8.9 mg/dL (ref 8.9–10.3)
Chloride: 98 mmol/L — ABNORMAL LOW (ref 101–111)
Creatinine, Ser: 1.62 mg/dL — ABNORMAL HIGH (ref 0.61–1.24)
GFR calc non Af Amer: 39 mL/min — ABNORMAL LOW (ref >60)
GFR, EST AFRICAN AMERICAN: 45 mL/min — AB (ref >60)
GLUCOSE: 90 mg/dL (ref 65–99)
POTASSIUM: 3.9 mmol/L (ref 3.5–5.1)
Sodium: 140 mmol/L (ref 135–145)

## 2015-04-13 LAB — GLUCOSE, CAPILLARY
GLUCOSE-CAPILLARY: 184 mg/dL — AB (ref 65–99)
Glucose-Capillary: 81 mg/dL (ref 65–99)

## 2015-04-13 LAB — PROTIME-INR
INR: 2.38
Prothrombin Time: 25.7 seconds — ABNORMAL HIGH (ref 11.4–15.0)

## 2015-04-13 MED ORDER — WARFARIN SODIUM 1 MG PO TABS: 3.0000 mg | ORAL_TABLET | Freq: Every day | ORAL | Status: DC

## 2015-04-13 MED ORDER — ASPIRIN 81 MG PO TBEC: 81.0000 mg | DELAYED_RELEASE_TABLET | Freq: Every day | ORAL | Status: DC

## 2015-04-13 MED ORDER — WARFARIN - PHARMACIST DOSING INPATIENT: Freq: Every day | Status: DC

## 2015-04-13 NOTE — Progress Notes (Signed)
Spoke with dr. Posey Pronto to make aware patients bun and creat has increase since yesterday. Patient currently on po lasix 40mg  po bid. Per md hold am dose will assess patient and make changes as needed

## 2015-04-13 NOTE — Discharge Instructions (Signed)
Heart Failure Clinic appointment on April 23, 2015 at 10:00am with Darylene Price, [place]. Please call [phone] to reschedule.  DIET:  Cardiac diet  DISCHARGE CONDITION:  Stable  ACTIVITY:  Activity as tolerated    OXYGEN:  Home Oxygen: No.   Oxygen Delivery: room air  DISCHARGE LOCATION:  home    ADDITIONAL DISCHARGE INSTRUCTION: dont take lasix until Friday when seen by dr. Saralyn Pilar,  Check bmp on friday   If you experience worsening of your admission symptoms, develop shortness of breath, life threatening emergency, suicidal or homicidal thoughts you must seek medical attention immediately by calling 911 or calling your MD immediately  if symptoms less severe.  You Must read complete instructions/literature along with all the possible adverse reactions/side effects for all the Medicines you take and that have been prescribed to you. Take any new Medicines after you have completely understood and accpet all the possible adverse reactions/side effects.   Please note  You were cared for by a hospitalist during your hospital stay. If you have any questions about your discharge medications or the care you received while you were in the hospital after you are discharged, you can call the unit and asked to speak with the hospitalist on call if the hospitalist that took care of you is not available. Once you are discharged, your primary care physician will handle any further medical issues. Please note that NO REFILLS for any discharge medications will be authorized once you are discharged, as it is imperative that you return to your primary care physician (or establish a relationship with a primary care physician if you do not have one) for your aftercare needs so that they can reassess your need for medications and monitor your lab values.

## 2015-04-13 NOTE — Progress Notes (Signed)
Discharge instructions along with home medication list and follow up gone over with patient and wife. Both verbalized that they understood instructions. No rx given to patient. Patient to be discharged home on RA, no distress noted. Iv removed x2, telemetry removed. Wife at bedside to transport home.

## 2015-04-13 NOTE — Consult Note (Signed)
ANTICOAGULATION CONSULT NOTE - Initial Consult  Pharmacy Consult for warfarin Indication: atrial fibrillation  Allergies  Allergen Reactions  . Adhesive [Tape] Rash  . Sulfa Antibiotics Rash  . Sulfamethoxazole Hives, Itching and Rash    Patient Measurements: Height: 5\' 10"  (177.8 cm) Weight: 184 lb 11.2 oz (83.779 kg) IBW/kg (Calculated) : 73 Heparin Dosing Weight:   Vital Signs: Temp: 97.6 F (36.4 C) (02/28 0419) Temp Source: Oral (02/28 0419) BP: 110/67 mmHg (02/28 0749) Pulse Rate: 65 (02/28 0419)  Labs:  Recent Labs  04/11/15 0812 04/11/15 1101 04/11/15 1608 04/11/15 2317 04/12/15 0457 04/13/15 0507  HGB 13.3  --   --   --  12.1*  --   HCT 39.3*  --   --   --  35.3*  --   PLT 144*  --   --   --  99*  --   LABPROT 38.0*  --   --   --  38.0* 25.7*  INR 4.00*  --   --   --  4.00* 2.38  CREATININE 1.09  --   --   --  1.45* 1.62*  TROPONINI <0.03 0.03 <0.03 <0.03  --   --     Estimated Creatinine Clearance: 38.8 mL/min (by C-G formula based on Cr of 1.62).   Medical History: Past Medical History  Diagnosis Date  . Coronary artery disease   . Hypertension   . Myocardial infarction ([place])   . Hypothyroidism   . Diabetes mellitus without complication ([place])   . GERD (gastroesophageal reflux disease)   . Arthritis   . Heart murmur   . Shortness of breath dyspnea   . Cancer ([place])     skin    Medications:  Scheduled:  . aspirin EC  81 mg Oral Daily  . clopidogrel  75 mg Oral QPM  . enalapril  5 mg Oral BID  . furosemide  40 mg Oral BID  . gabapentin  300 mg Oral BID  . glipiZIDE  5 mg Oral BH-q7a  . insulin aspart  0-9 Units Subcutaneous TID WC  . levothyroxine  137 mcg Oral QAC breakfast  . metFORMIN  500 mg Oral BID WC  . metoprolol  50 mg Oral TID  . pantoprazole  40 mg Oral Daily  . simvastatin  40 mg Oral QPM  . sodium chloride flush  3 mL Intravenous Q12H  . sotalol  80 mg Oral BID  . tamsulosin  0.4 mg Oral q morning - 10a  . temazepam   15 mg Oral QHS  . warfarin  3 mg Oral q1800  . Warfarin - Pharmacist Dosing Inpatient   Does not apply q1800    Assessment: Pt is a 80 year old male with afib, on warfarin. Pharmacy consulted to dose. Home dose is 4mg  daily. Patient INR is supratherapeutic at 4. Patient recently completed a round of antibiotics.  2/26 INR: 4.0,   no warfarin 2/27 INR: 4.0,   no warfarin 2/28 INR: 2.38  Goal of Therapy:  INR 2-3 Monitor platelets by anticoagulation protocol: Yes   Plan:  INR is now in therapeutic range after holding warfarin for 2 days due to supratherapeutic INR. Will resume warfarin at 3 mg daily and f/u AM INR.   Ulice Dash, PharmD Clinical Pharmacist  04/13/2015 8:07 AM

## 2015-04-13 NOTE — Care Management Important Message (Signed)
Important Message  Patient Details  Name: Derrick Cochran MRN: [id number] Date of Birth: 1935-04-10   Medicare Important Message Given:  Yes    [name] 04/13/2015, 10:16 AM

## 2015-04-13 NOTE — Progress Notes (Signed)
Derrick Cochran, 80 y.o., DOB Aug 22, 1935, MRN [id number]. Admission date: 04/11/2015 Discharge Date 04/13/2015 Primary MD [name], MD Admitting Physician Dustin Flock, MD  Admission Diagnosis  Palpitations [R00.2] Acute pulmonary edema [facility]) [J81.0]  Discharge Diagnosis   Active Problems:   Acute systolic CHF  Atrial fibrillation with RVR.   Coronary artery disease Diabetes type 2 hyperlipdemia GERD       Hospital Course  Derrick Cochran is a 80 y.o. male with a known history of coronary artery disease, hypertension, diabetes type 2, GERD, atrial fibrillation who presents to the emergency room with complaint of shortness of breath. Patient had a chest x-ray which showed CHF. He was also complaining of palpitations as well. He was admitted for acute cyst CHF his echo showed worsening EF. He was treated with Lasix with improvement in his symptoms however his creatinine did increase. He was seen by his cardiologist who agreed with the plan. His heart rate remained stable. His creatinine did increase I asked him to hold his Lasix until he was seen at the [facility] to clinic this Friday. At that time recommend him check a BMP.           Consults  cardiology  Significant Tests:  See full reports for all details      Dg Chest 2 View  04/09/2015  CLINICAL DATA:  Shortness of breath since this morning. Congestion. Productive cough. History of the flu. EXAM: CHEST  2 VIEW COMPARISON:  07/02/2014; 06/29/2014 FINDINGS: Grossly unchanged enlarged cardiac silhouette and mediastinal contours post median sternotomy, CABG and percutaneous aortic valve replacement. Stable positioning of support apparatus. The lungs remain hyperexpanded with flattening of the diaphragms and mild diffuse slightly nodular thickening of the pulmonary interstitium. There is chronic blunting of the bilateral costophrenic angles without definite pleural effusion. No pneumothorax evidence of edema. No acute osseus  abnormalities. IMPRESSION: Hyperexpanded lungs and bronchitic change without definite superimposed acute cardiopulmonary disease. Specifically, no focal airspace opacities suggest pneumonia. Electronically Signed   By: Sandi Mariscal M.D.   On: 04/09/2015 09:09   Dg Chest Portable 1 View  04/11/2015  CLINICAL DATA:  Shortness of breath beginning last night. Mild respiratory distress. EXAM: PORTABLE CHEST 1 VIEW COMPARISON:  04/09/2015 FINDINGS: Prior CABG. Left pacer remains in place, unchanged. Cardiomegaly with interstitial prominence in the lungs, possibly interstitial edema. No visible effusions or acute bony abnormality. IMPRESSION: Cardiomegaly with vascular congestion and interstitial prominence, likely interstitial edema. Electronically Signed   By: Rolm Baptise M.D.   On: 04/11/2015 08:24       Today   Subjective:   Derrick Cochran  Feels well and is any chest pain or shortness of breath  Objective:   Blood pressure 110/67, pulse 65, temperature 97.6 F (36.4 C), temperature source Oral, resp. rate 20, height 5\' 10"  (1.778 m), weight 83.779 kg (184 lb 11.2 oz), SpO2 94 %.  .  Intake/Output Summary (Last 24 hours) at 04/13/15 1629 Last data filed at 04/13/15 1005  Gross per 24 hour  Intake    480 ml  Output   1575 ml  Net  -1095 ml    Exam VITAL SIGNS: Blood pressure 110/67, pulse 65, temperature 97.6 F (36.4 C), temperature source Oral, resp. rate 20, height 5\' 10"  (1.778 m), weight 83.779 kg (184 lb 11.2 oz), SpO2 94 %.  GENERAL:  80 y.o.-year-old patient lying in the bed with no acute distress.  EYES: Pupils equal, round, reactive to light and accommodation. No scleral icterus. Extraocular  muscles intact.  HEENT: Head atraumatic, normocephalic. Oropharynx and nasopharynx clear.  NECK:  Supple, no jugular venous distention. No thyroid enlargement, no tenderness.  LUNGS: Normal breath sounds bilaterally, no wheezing, rales,rhonchi or crepitation. No use of accessory muscles of  respiration.  CARDIOVASCULAR: S1, S2 normal. No murmurs, rubs, or gallops.  ABDOMEN: Soft, nontender, nondistended. Bowel sounds present. No organomegaly or mass.  EXTREMITIES: No pedal edema, cyanosis, or clubbing.  NEUROLOGIC: Cranial nerves II through XII are intact. Muscle strength 5/5 in all extremities. Sensation intact. Gait not checked.  PSYCHIATRIC: The patient is alert and oriented x 3.  SKIN: No obvious rash, lesion, or ulcer.   Data Review     CBC w Diff: Lab Results  Component Value Date   WBC 6.4 04/12/2015   WBC 7.3 05/19/2014   HGB 12.1* 04/12/2015   HGB 12.5* 05/19/2014   HCT 35.3* 04/12/2015   HCT 37.3* 05/19/2014   PLT 99* 04/12/2015   PLT 81* 05/19/2014   LYMPHOPCT 19% 04/09/2015   LYMPHOPCT 21.2 05/19/2014   MONOPCT 10% 04/09/2015   MONOPCT 8.4 05/19/2014   EOSPCT 4% 04/09/2015   EOSPCT 3.2 05/19/2014   BASOPCT 1% 04/09/2015   BASOPCT 0.5 05/19/2014   CMP: Lab Results  Component Value Date   NA 140 04/13/2015   NA 136 05/19/2014   K 3.9 04/13/2015   K 4.6 05/19/2014   CL 98* 04/13/2015   CL 100* 05/19/2014   CO2 31 04/13/2015   CO2 29 05/19/2014   BUN 42* 04/13/2015   BUN 34* 05/19/2014   CREATININE 1.62* 04/13/2015   CREATININE 1.32* 05/19/2014   PROT 7.6 04/11/2015   ALBUMIN 3.6 04/11/2015   BILITOT 1.1 04/11/2015   ALKPHOS 55 04/11/2015   AST 37 04/11/2015   ALT 35 04/11/2015  .  Micro Results No results found for this or any previous visit (from the past 240 hour(s)).   Code Status History    Date Active Date Inactive Code Status Order ID Comments User Context   04/11/2015 10:22 AM 04/13/2015  4:03 PM Full Code [id number]  Dustin Flock, MD ED   07/02/2014  3:47 PM 07/04/2014  1:52 PM Full Code [id number]  [name], MD Inpatient    Advance Directive Documentation        Most Recent Value   Type of Advance Directive  Living will   Pre-existing out of facility DNR order (yellow form or pink MOST form)     "MOST" Form in  Place?            Follow-up Information    Follow up with Alisa Graff, FNP. Go on 04/23/2015.   Specialty:  Family Medicine   Why:  at 10:00am , to the [facility]   Contact information:   [place] [address] [address] [phone]       Follow up with SPARKS,JEFFREY D, MD In 7 days.   Specialty:  Internal Medicine   Why:  Tuesday, March 7th at 2pm, [facility] information:   [facility] [place] [address] [phone]       Follow up with [name], MD.   Specialty:  Cardiology   Why:  Friday, March 3rd at 1115am, [facility]   Contact information:   [address] [facility] [place] [address] [phone]       Discharge Medications     Medication List    TAKE these medications  acetaminophen 500 MG tablet  Commonly known as:  TYLENOL  Take 500 mg by mouth every 6 (six) hours as needed for mild pain, moderate pain or fever.     aspirin 81 MG EC tablet  Take 1 tablet (81 mg total) by mouth daily.     cephALEXin 250 MG capsule  Commonly known as:  KEFLEX  Take 1 capsule (250 mg total) by mouth 4 (four) times daily.     clopidogrel 75 MG tablet  Commonly known as:  PLAVIX  Take 75 mg by mouth every evening.     enalapril 5 MG tablet  Commonly known as:  VASOTEC  Take 5 mg by mouth 2 (two) times daily.     furosemide 20 MG tablet  Commonly known as:  LASIX  Take 20 mg by mouth every morning.     gabapentin 300 MG capsule  Commonly known as:  NEURONTIN  Take 300 mg by mouth 2 (two) times daily.     glipiZIDE 5 MG 24 hr tablet  Commonly known as:  GLUCOTROL XL  Take 5 mg by mouth every morning.     levothyroxine 137 MCG tablet  Commonly known as:  SYNTHROID, LEVOTHROID  Take 137 mcg by mouth daily before breakfast.     metFORMIN 500 MG tablet  Commonly known as:  GLUCOPHAGE  Take 500 mg by mouth 2 (two) times daily.     metoprolol  50 MG tablet  Commonly known as:  LOPRESSOR  Take 1 tablet (50 mg total) by mouth 3 (three) times daily.     omeprazole 20 MG capsule  Commonly known as:  PRILOSEC  Take 20 mg by mouth every morning.     simvastatin 40 MG tablet  Commonly known as:  ZOCOR  Take 40 mg by mouth every evening.     sotalol 80 MG tablet  Commonly known as:  BETAPACE  Take 1 tablet (80 mg total) by mouth 2 (two) times daily.     tamsulosin 0.4 MG Caps capsule  Commonly known as:  FLOMAX  Take 0.4 mg by mouth every morning.     temazepam 15 MG capsule  Commonly known as:  RESTORIL  Take 15 mg by mouth at bedtime.     VIRTUSSIN A/C 100-10 MG/5ML syrup  Generic drug:  guaiFENesin-codeine  Take 5 mLs by mouth every 6 (six) hours as needed. For cough.     warfarin 3 MG tablet  Commonly known as:  COUMADIN  Take 3 mg by mouth every evening. Pt takes with 1mg  tablet for a total dose of 4mg .     warfarin 1 MG tablet  Commonly known as:  COUMADIN  Take 1 mg by mouth every evening. Pt takes with 3mg  tablet for a total dose of 4mg .           Total Time in preparing paper work, data evaluation and todays exam - 35 minutes  Dustin Flock M.D on 04/13/2015 at 4:29 PM  [facility]   Office  [phone]

## 2015-04-14 NOTE — Discharge Summary (Signed)
Derrick Cochran, 80 y.o., DOB [birth year], MRN [id number]. Admission date: 04/11/2015 Discharge Date 04/14/2015 Primary MD [name], MD Admitting Physician Dustin Flock, MD  Admission Diagnosis  Palpitations [R00.2] Acute pulmonary edema [facility]) [J81.0]  Discharge Diagnosis   Active Problems:   Acute systolic CHF  Atrial fibrillation with RVR.   Coronary artery disease Diabetes type 2 hyperlipdemia GERD       Hospital Course  Reinier Lienhart is a 80 y.o. male with a known history of coronary artery disease, hypertension, diabetes type 2, GERD, atrial fibrillation who presents to the emergency room with complaint of shortness of breath. Patient had a chest x-ray which showed CHF. He was also complaining of palpitations as well. He was admitted for acute cyst CHF his echo showed worsening EF. He was treated with Lasix with improvement in his symptoms however his creatinine did increase. He was seen by his cardiologist who agreed with the plan. His heart rate remained stable. His creatinine did increase I asked him to hold his Lasix until he was seen at the [facility] to clinic this Friday. At that time recommend him check a BMP.           Consults  cardiology  Significant Tests:  See full reports for all details      Dg Chest 2 View  04/09/2015  CLINICAL DATA:  Shortness of breath since this morning. Congestion. Productive cough. History of the flu. EXAM: CHEST  2 VIEW COMPARISON:  07/02/2014; 06/29/2014 FINDINGS: Grossly unchanged enlarged cardiac silhouette and mediastinal contours post median sternotomy, CABG and percutaneous aortic valve replacement. Stable positioning of support apparatus. The lungs remain hyperexpanded with flattening of the diaphragms and mild diffuse slightly nodular thickening of the pulmonary interstitium. There is chronic blunting of the bilateral costophrenic angles without definite pleural effusion. No pneumothorax evidence of edema. No acute osseus  abnormalities. IMPRESSION: Hyperexpanded lungs and bronchitic change without definite superimposed acute cardiopulmonary disease. Specifically, no focal airspace opacities suggest pneumonia. Electronically Signed   By: Sandi Mariscal M.D.   On: 04/09/2015 09:09   Dg Chest Portable 1 View  04/11/2015  CLINICAL DATA:  Shortness of breath beginning last night. Mild respiratory distress. EXAM: PORTABLE CHEST 1 VIEW COMPARISON:  04/09/2015 FINDINGS: Prior CABG. Left pacer remains in place, unchanged. Cardiomegaly with interstitial prominence in the lungs, possibly interstitial edema. No visible effusions or acute bony abnormality. IMPRESSION: Cardiomegaly with vascular congestion and interstitial prominence, likely interstitial edema. Electronically Signed   By: Rolm Baptise M.D.   On: 04/11/2015 08:24       Today   Subjective:   [name]  Feels well and is any chest pain or shortness of breath  Objective:   Blood pressure 110/67, pulse 65, temperature 97.6 F (36.4 C), temperature source Oral, resp. rate 20, height 5\' 10"  (1.778 m), weight 83.779 kg (184 lb 11.2 oz), SpO2 94 %.  . No intake or output data in the 24 hours ending 04/14/15 1255  Exam VITAL SIGNS: Blood pressure 110/67, pulse 65, temperature 97.6 F (36.4 C), temperature source Oral, resp. rate 20, height 5\' 10"  (1.778 m), weight 83.779 kg (184 lb 11.2 oz), SpO2 94 %.  GENERAL:  80 y.o.-year-old patient lying in the bed with no acute distress.  EYES: Pupils equal, round, reactive to light and accommodation. No scleral icterus. Extraocular muscles intact.  HEENT: Head atraumatic, normocephalic. Oropharynx and nasopharynx clear.  NECK:  Supple, no jugular venous distention. No thyroid enlargement, no tenderness.  LUNGS: Normal breath  sounds bilaterally, no wheezing, rales,rhonchi or crepitation. No use of accessory muscles of respiration.  CARDIOVASCULAR: S1, S2 normal. No murmurs, rubs, or gallops.  ABDOMEN: Soft, nontender,  nondistended. Bowel sounds present. No organomegaly or mass.  EXTREMITIES: No pedal edema, cyanosis, or clubbing.  NEUROLOGIC: Cranial nerves II through XII are intact. Muscle strength 5/5 in all extremities. Sensation intact. Gait not checked.  PSYCHIATRIC: The patient is alert and oriented x 3.  SKIN: No obvious rash, lesion, or ulcer.   Data Review     CBC w Diff:  Lab Results  Component Value Date   WBC 6.4 04/12/2015   WBC 7.3 05/19/2014   HGB 12.1* 04/12/2015   HGB 12.5* 05/19/2014   HCT 35.3* 04/12/2015   HCT 37.3* 05/19/2014   PLT 99* 04/12/2015   PLT 81* 05/19/2014   LYMPHOPCT 19% 04/09/2015   LYMPHOPCT 21.2 05/19/2014   MONOPCT 10% 04/09/2015   MONOPCT 8.4 05/19/2014   EOSPCT 4% 04/09/2015   EOSPCT 3.2 05/19/2014   BASOPCT 1% 04/09/2015   BASOPCT 0.5 05/19/2014   CMP:  Lab Results  Component Value Date   NA 140 04/13/2015   NA 136 05/19/2014   K 3.9 04/13/2015   K 4.6 05/19/2014   CL 98* 04/13/2015   CL 100* 05/19/2014   CO2 31 04/13/2015   CO2 29 05/19/2014   BUN 42* 04/13/2015   BUN 34* 05/19/2014   CREATININE 1.62* 04/13/2015   CREATININE 1.32* 05/19/2014   PROT 7.6 04/11/2015   ALBUMIN 3.6 04/11/2015   BILITOT 1.1 04/11/2015   ALKPHOS 55 04/11/2015   AST 37 04/11/2015   ALT 35 04/11/2015  .  Micro Results No results found for this or any previous visit (from the past 240 hour(s)).   Code Status History    Date Active Date Inactive Code Status Order ID Comments User Context   04/11/2015 10:22 AM 04/13/2015  4:03 PM Full Code [id number]  Dustin Flock, MD ED   07/02/2014  3:47 PM 07/04/2014  1:52 PM Full Code [id number]  [name], MD Inpatient    Advance Directive Documentation        Most Recent Value   Type of Advance Directive  Living will   Pre-existing out of facility DNR order (yellow form or pink MOST form)     "MOST" Form in Place?                Follow-up Information    Follow up with Alisa Graff, FNP. Go on  04/23/2015.   Specialty:  Family Medicine   Why:  at 10:00am , to the [facility]   Contact information:   [place] [address] [address] [phone]       Follow up with SPARKS,JEFFREY D, MD In 7 days.   Specialty:  Internal Medicine   Why:  Tuesday, March 7th at 2pm, [facility] information:   [facility] [place] [address] [phone]       Follow up with [name], MD.   Specialty:  Cardiology   Why:  Friday, March 3rd at 1115am, c[facility]   Contact information:   [facility] [place] [address] [phone]       Discharge Medications     Medication List    TAKE these medications        acetaminophen 500 MG tablet  Commonly known as:  TYLENOL  Take 500 mg by mouth  every 6 (six) hours as needed for mild pain, moderate pain or fever.     aspirin 81 MG EC tablet  Take 1 tablet (81 mg total) by mouth daily.     cephALEXin 250 MG capsule  Commonly known as:  KEFLEX  Take 1 capsule (250 mg total) by mouth 4 (four) times daily.     clopidogrel 75 MG tablet  Commonly known as:  PLAVIX  Take 75 mg by mouth every evening.     enalapril 5 MG tablet  Commonly known as:  VASOTEC  Take 5 mg by mouth 2 (two) times daily.     furosemide 20 MG tablet  Commonly known as:  LASIX  Take 20 mg by mouth every morning.     gabapentin 300 MG capsule  Commonly known as:  NEURONTIN  Take 300 mg by mouth 2 (two) times daily.     glipiZIDE 5 MG 24 hr tablet  Commonly known as:  GLUCOTROL XL  Take 5 mg by mouth every morning.     levothyroxine 137 MCG tablet  Commonly known as:  SYNTHROID, LEVOTHROID  Take 137 mcg by mouth daily before breakfast.     metFORMIN 500 MG tablet  Commonly known as:  GLUCOPHAGE  Take 500 mg by mouth 2 (two) times daily.     metoprolol 50 MG tablet  Commonly known as:  LOPRESSOR  Take 1 tablet (50 mg total) by mouth 3 (three)  times daily.     omeprazole 20 MG capsule  Commonly known as:  PRILOSEC  Take 20 mg by mouth every morning.     simvastatin 40 MG tablet  Commonly known as:  ZOCOR  Take 40 mg by mouth every evening.     sotalol 80 MG tablet  Commonly known as:  BETAPACE  Take 1 tablet (80 mg total) by mouth 2 (two) times daily.     tamsulosin 0.4 MG Caps capsule  Commonly known as:  FLOMAX  Take 0.4 mg by mouth every morning.     temazepam 15 MG capsule  Commonly known as:  RESTORIL  Take 15 mg by mouth at bedtime.     VIRTUSSIN A/C 100-10 MG/5ML syrup  Generic drug:  guaiFENesin-codeine  Take 5 mLs by mouth every 6 (six) hours as needed. For cough.     warfarin 3 MG tablet  Commonly known as:  COUMADIN  Take 3 mg by mouth every evening. Pt takes with 1mg  tablet for a total dose of 4mg .     warfarin 1 MG tablet  Commonly known as:  COUMADIN  Take 1 mg by mouth every evening. Pt takes with 3mg  tablet for a total dose of 4mg .           Total Time in preparing paper work, data evaluation and todays exam - 35 minutes  Dustin Flock M.D on 04/14/2015 at 12:55 PM  [facility]   Office  [phone]

## 2015-04-16 DIAGNOSIS — I2119 ST elevation (STEMI) myocardial infarction involving other coronary artery of inferior wall: Secondary | ICD-10-CM | POA: Diagnosis not present

## 2015-04-16 DIAGNOSIS — Z9889 Other specified postprocedural states: Secondary | ICD-10-CM | POA: Diagnosis not present

## 2015-04-16 DIAGNOSIS — I350 Nonrheumatic aortic (valve) stenosis: Secondary | ICD-10-CM | POA: Diagnosis not present

## 2015-04-16 DIAGNOSIS — I10 Essential (primary) hypertension: Secondary | ICD-10-CM | POA: Diagnosis not present

## 2015-04-16 DIAGNOSIS — R748 Abnormal levels of other serum enzymes: Secondary | ICD-10-CM | POA: Diagnosis not present

## 2015-04-16 DIAGNOSIS — I714 Abdominal aortic aneurysm, without rupture: Secondary | ICD-10-CM | POA: Diagnosis not present

## 2015-04-16 DIAGNOSIS — I495 Sick sinus syndrome: Secondary | ICD-10-CM | POA: Diagnosis not present

## 2015-04-16 DIAGNOSIS — I480 Paroxysmal atrial fibrillation: Secondary | ICD-10-CM | POA: Diagnosis not present

## 2015-04-16 DIAGNOSIS — I482 Chronic atrial fibrillation: Secondary | ICD-10-CM | POA: Diagnosis not present

## 2015-04-16 DIAGNOSIS — I4891 Unspecified atrial fibrillation: Secondary | ICD-10-CM | POA: Diagnosis not present

## 2015-04-20 DIAGNOSIS — Z792 Long term (current) use of antibiotics: Secondary | ICD-10-CM | POA: Diagnosis not present

## 2015-04-20 DIAGNOSIS — J069 Acute upper respiratory infection, unspecified: Secondary | ICD-10-CM | POA: Diagnosis not present

## 2015-04-20 DIAGNOSIS — Z79899 Other long term (current) drug therapy: Secondary | ICD-10-CM | POA: Diagnosis not present

## 2015-04-20 DIAGNOSIS — E119 Type 2 diabetes mellitus without complications: Secondary | ICD-10-CM | POA: Diagnosis not present

## 2015-04-20 DIAGNOSIS — R0602 Shortness of breath: Secondary | ICD-10-CM | POA: Diagnosis not present

## 2015-04-20 DIAGNOSIS — I482 Chronic atrial fibrillation: Secondary | ICD-10-CM | POA: Diagnosis not present

## 2015-04-20 DIAGNOSIS — I5022 Chronic systolic (congestive) heart failure: Secondary | ICD-10-CM | POA: Diagnosis not present

## 2015-04-20 DIAGNOSIS — Z7901 Long term (current) use of anticoagulants: Secondary | ICD-10-CM | POA: Diagnosis not present

## 2015-04-20 DIAGNOSIS — E039 Hypothyroidism, unspecified: Secondary | ICD-10-CM | POA: Diagnosis not present

## 2015-04-23 ENCOUNTER — Ambulatory Visit: Payer: Commercial Managed Care - HMO | Attending: Family | Admitting: Family

## 2015-04-23 ENCOUNTER — Encounter: Payer: Self-pay | Admitting: Family

## 2015-04-23 VITALS — BP 127/51 | HR 80 | Resp 18 | Ht 70.0 in | Wt 189.0 lb

## 2015-04-23 DIAGNOSIS — I252 Old myocardial infarction: Secondary | ICD-10-CM | POA: Insufficient documentation

## 2015-04-23 DIAGNOSIS — I2510 Atherosclerotic heart disease of native coronary artery without angina pectoris: Secondary | ICD-10-CM | POA: Insufficient documentation

## 2015-04-23 DIAGNOSIS — I482 Chronic atrial fibrillation, unspecified: Secondary | ICD-10-CM

## 2015-04-23 DIAGNOSIS — Z87891 Personal history of nicotine dependence: Secondary | ICD-10-CM | POA: Diagnosis not present

## 2015-04-23 DIAGNOSIS — Z7901 Long term (current) use of anticoagulants: Secondary | ICD-10-CM | POA: Insufficient documentation

## 2015-04-23 DIAGNOSIS — E119 Type 2 diabetes mellitus without complications: Secondary | ICD-10-CM | POA: Diagnosis not present

## 2015-04-23 DIAGNOSIS — Z79899 Other long term (current) drug therapy: Secondary | ICD-10-CM | POA: Diagnosis not present

## 2015-04-23 DIAGNOSIS — Z888 Allergy status to other drugs, medicaments and biological substances status: Secondary | ICD-10-CM | POA: Diagnosis not present

## 2015-04-23 DIAGNOSIS — I5022 Chronic systolic (congestive) heart failure: Secondary | ICD-10-CM | POA: Diagnosis not present

## 2015-04-23 DIAGNOSIS — Z7984 Long term (current) use of oral hypoglycemic drugs: Secondary | ICD-10-CM | POA: Insufficient documentation

## 2015-04-23 DIAGNOSIS — E039 Hypothyroidism, unspecified: Secondary | ICD-10-CM | POA: Diagnosis not present

## 2015-04-23 DIAGNOSIS — K219 Gastro-esophageal reflux disease without esophagitis: Secondary | ICD-10-CM | POA: Diagnosis not present

## 2015-04-23 DIAGNOSIS — M1990 Unspecified osteoarthritis, unspecified site: Secondary | ICD-10-CM | POA: Insufficient documentation

## 2015-04-23 DIAGNOSIS — Z9889 Other specified postprocedural states: Secondary | ICD-10-CM | POA: Insufficient documentation

## 2015-04-23 DIAGNOSIS — I4891 Unspecified atrial fibrillation: Secondary | ICD-10-CM | POA: Diagnosis not present

## 2015-04-23 DIAGNOSIS — I10 Essential (primary) hypertension: Secondary | ICD-10-CM | POA: Insufficient documentation

## 2015-04-23 DIAGNOSIS — Z7982 Long term (current) use of aspirin: Secondary | ICD-10-CM | POA: Insufficient documentation

## 2015-04-23 NOTE — Progress Notes (Signed)
Subjective:    Patient ID: Derrick Cochran, male    DOB: 05/05/1935, 80 y.o.   MRN: [id number]  Congestive Heart Failure Presents for initial visit. The disease course has been improving. Associated symptoms include edema (as the day progresses), fatigue and shortness of breath (at times). Pertinent negatives include no abdominal pain, chest pain, muscle weakness, orthopnea or palpitations. The symptoms have been improving. Past treatments include ACE inhibitors, beta blockers and salt and fluid restriction. The treatment provided moderate relief. Compliance with prior treatments has been good. His past medical history is significant for arrhythmia, CAD, DM and HTN. There is no history of CVA. He has one 1st degree relative with heart disease.  Hypertension This is a chronic problem. The current episode started more than 1 year ago. The problem is unchanged. The problem is controlled. Associated symptoms include peripheral edema and shortness of breath (at times). Pertinent negatives include no chest pain, headaches, neck pain or palpitations. Agents associated with hypertension include thyroid hormones. Risk factors for coronary artery disease include diabetes mellitus, family history and male gender. Past treatments include ACE inhibitors, beta blockers, diuretics and lifestyle changes. The current treatment provides significant improvement. There are no compliance problems.  Hypertensive end-organ damage includes CAD/MI and heart failure.    Past Medical History  Diagnosis Date  . Coronary artery disease   . Hypertension   . Myocardial infarction ([place])   . Hypothyroidism   . Diabetes mellitus without complication ([place])   . GERD (gastroesophageal reflux disease)   . Arthritis   . Heart murmur   . Shortness of breath dyspnea   . Cancer ([place])     skin  . CHF (congestive heart failure) ([place])   . Arrhythmia     atrial fibrillation    Past Surgical History  Procedure Laterality Date  .  Coronary artery bypass graft    . Cardiac catheterization    . Appendectomy    . Cardiac valve replacement      Bovine transcatheter heart valve  . Great toe arthrodesis, interphalangeal joint Left   . Pacemaker insertion N/A 07/02/2014    Procedure: INSERTION PACEMAKER/DUAL CHAMBER  INITIAL IMPLANT;  Surgeon: [name], MD;  Location: [facility];  Service: Cardiovascular;  Laterality: N/A;  . Electrophysiologic study N/A 08/06/2014    Procedure: Cardioversion;  Surgeon: [name], MD;  Location: [place] [facility];  Service: Cardiovascular;  Laterality: N/A;  . Electrophysiologic study N/A 06/18/2014    Procedure: CARDIOVERSION;  Surgeon: [name], MD;  Location: [facility];  Service: Cardiovascular;  Laterality: N/A;    Family History  Problem Relation Age of Onset  . Cancer Mother   . Cancer Father   . Kidney disease Sister   . Heart failure Brother     Social History  Substance Use Topics  . Smoking status: Former Smoker -- 0.50 packs/day for 15 years    Types: Cigarettes    Quit date: 04/22/1985  . Smokeless tobacco: Former Systems developer    Types: Chew  . Alcohol Use: No    Allergies  Allergen Reactions  . Cefdinir Other (See Comments)    nightmares  . Adhesive [Tape] Rash  . Sulfa Antibiotics Rash  . Sulfamethoxazole Hives, Itching and Rash    Prior to Admission medications   Medication Sig Start Date End Date Taking? Authorizing Provider  acetaminophen (TYLENOL) 500 MG tablet Take 500 mg by mouth every 6 (six) hours as needed for mild pain, moderate pain or fever.  Yes Historical Provider, MD  aspirin EC 81 MG EC tablet Take 1 tablet (81 mg total) by mouth daily. 04/13/15  Yes Dustin Flock, MD  enalapril (VASOTEC) 5 MG tablet Take 5 mg by mouth 2 (two) times daily.    Yes Historical Provider, MD  gabapentin (NEURONTIN) 300 MG capsule Take 300 mg by mouth daily.    Yes Historical Provider, MD  glipiZIDE (GLUCOTROL XL) 5 MG 24 hr tablet Take 5 mg  by mouth every morning. 01/21/15  Yes Historical Provider, MD  levothyroxine (SYNTHROID, LEVOTHROID) 137 MCG tablet Take 137 mcg by mouth daily before breakfast.    Yes Historical Provider, MD  metFORMIN (GLUCOPHAGE) 500 MG tablet Take 500 mg by mouth 2 (two) times daily.    Yes Historical Provider, MD  metoprolol (LOPRESSOR) 50 MG tablet Take 1 tablet (50 mg total) by mouth 3 (three) times daily. 04/09/15 04/08/16 Yes Daymon Larsen, MD  nitroGLYCERIN (NITROSTAT) 0.4 MG SL tablet Place 0.4 mg under the tongue every 5 (five) minutes as needed for chest pain.   Yes Historical Provider, MD  omeprazole (PRILOSEC) 20 MG capsule Take 20 mg by mouth every morning.    Yes Historical Provider, MD  simvastatin (ZOCOR) 40 MG tablet Take 40 mg by mouth every evening.    Yes Historical Provider, MD  sotalol (BETAPACE) 80 MG tablet Take 1 tablet (80 mg total) by mouth 2 (two) times daily. 07/04/14  Yes [name], MD  tamsulosin (FLOMAX) 0.4 MG CAPS capsule Take 0.4 mg by mouth every morning.    Yes Historical Provider, MD  temazepam (RESTORIL) 15 MG capsule Take 15 mg by mouth at bedtime.    Yes Historical Provider, MD  warfarin (COUMADIN) 1 MG tablet Take 1 mg by mouth every evening. Pt takes with 3mg  tablet for a total dose of 4mg .   Yes Historical Provider, MD  warfarin (COUMADIN) 3 MG tablet Take 3 mg by mouth every evening. Pt takes with 1mg  tablet for a total dose of 4mg .   Yes Historical Provider, MD  furosemide (LASIX) 20 MG tablet Take 20 mg by mouth every morning.     Historical Provider, MD     Review of Systems  Constitutional: Positive for appetite change (get full easily) and fatigue.  HENT: Positive for rhinorrhea. Negative for congestion and sore throat.   Eyes: Negative.   Respiratory: Positive for shortness of breath (at times). Negative for cough and chest tightness.   Cardiovascular: Positive for leg swelling (as the day progresses). Negative for chest pain and palpitations.   Gastrointestinal: Positive for abdominal distention. Negative for abdominal pain.  Endocrine: Negative.   Genitourinary: Negative.   Musculoskeletal: Negative for back pain, muscle weakness and neck pain.  Skin: Negative.   Allergic/Immunologic: Negative.   Neurological: Positive for dizziness and weakness. Negative for light-headedness and headaches.  Hematological: Negative for adenopathy. Bruises/bleeds easily.  Psychiatric/Behavioral: Positive for sleep disturbance (didn't sleep well last night due to nightmares from cefdinir). Negative for dysphoric mood. The patient is not nervous/anxious.        Objective:   Physical Exam  Constitutional: He is oriented to person, place, and time. He appears well-developed and well-nourished.  HENT:  Head: Normocephalic and atraumatic.  Eyes: Conjunctivae are normal. Pupils are equal, round, and reactive to light.  Neck: Normal range of motion. Neck supple.  Cardiovascular: Normal rate.  An irregular rhythm present.  Pulmonary/Chest: Effort normal. No respiratory distress. He has no wheezes. He has no rales.  Abdominal: Soft. He exhibits distension. There is no tenderness.  Musculoskeletal: He exhibits edema (trace edema in bilateral ankles). He exhibits no tenderness.  Neurological: He is alert and oriented to person, place, and time.  Skin: Skin is warm and dry.  Psychiatric: He has a normal mood and affect. His behavior is normal. Thought content normal.  Nursing note and vitals reviewed.   BP 127/51 mmHg  Pulse 80  Resp 18  Ht 5\' 10"  (1.778 m)  Wt 189 lb (85.73 kg)  BMI 27.12 kg/m2  SpO2 99%       Assessment & Plan:  1: Chronic heart failure with reduced ejection fraction- Patient presents with fatigue upon exertion along with some shortness of breath at times. He says that he was tired upon walking into the office today but says that they came into the wrong entrance so walked quite a distance. Once he sat down for a few  minutes, his energy level improved. He is already weighing himself on a daily basis and says that his weight has been stable. Instructed him to call for an overnight weight gain of >2 pounds or a weekly weight gain of >5 pounds. He is not adding any salt to his food and his wife is already reading food labels. She is using Mrs. Dash and pepper for seasoning. Discussed the importance of following a 2000mg  sodium diet and written information was given to them about the diet. Does notice some edema in his lower legs as the day progresses but then it goes completely away overnight. Does try to elevate them some during the day. Discussed cardiac rehab with the patient and a brochure was given to them. He says that he was supposed to go through it one other time but it never got set up. He will discuss this with his cardiologist on 05/07/15. Also discussed changing his enalapril to entresto and a brochure was given to them about the medication. They would like to also discuss this with their cardiologist. [facility] went in and reviewed medications with the patient. 2: Atrial fibrillation- Currently rate controlled at this time. Is taking warfarin and lopressor. 3: HTN- Blood pressure looks good. Continue medications at this time. 4: Diabetes- Is taking glipizide and metformin. Does check his glucose but doesn't remember what it was yesterday. Didn't check it today prior to coming. His wife says that she has all that information written down in a book at home but didn't bring it with her.    Patient mentions that he was begun on cefdinir yesterday and took the first dose last night. Didn't sleep well due to nightmares. Have added this as an allergy and he was instructed to call his PCP about this in case his PCP wants to change antibiotic therapy.   Return here in 1 month or sooner for any questions/problems before then.

## 2015-04-23 NOTE — Patient Instructions (Signed)
Continue weighing daily and call for an overnight weight gain of > 2 pounds or a weekly weight gain of >5 pounds. 

## 2015-04-27 DIAGNOSIS — I482 Chronic atrial fibrillation: Secondary | ICD-10-CM | POA: Diagnosis not present

## 2015-05-05 DIAGNOSIS — K649 Unspecified hemorrhoids: Secondary | ICD-10-CM | POA: Diagnosis not present

## 2015-05-05 DIAGNOSIS — R159 Full incontinence of feces: Secondary | ICD-10-CM | POA: Diagnosis not present

## 2015-05-07 DIAGNOSIS — I5022 Chronic systolic (congestive) heart failure: Secondary | ICD-10-CM | POA: Diagnosis not present

## 2015-05-07 DIAGNOSIS — I480 Paroxysmal atrial fibrillation: Secondary | ICD-10-CM | POA: Diagnosis not present

## 2015-05-07 DIAGNOSIS — I350 Nonrheumatic aortic (valve) stenosis: Secondary | ICD-10-CM | POA: Diagnosis not present

## 2015-05-07 DIAGNOSIS — I495 Sick sinus syndrome: Secondary | ICD-10-CM | POA: Diagnosis not present

## 2015-05-07 DIAGNOSIS — Z9889 Other specified postprocedural states: Secondary | ICD-10-CM | POA: Diagnosis not present

## 2015-05-07 DIAGNOSIS — Z951 Presence of aortocoronary bypass graft: Secondary | ICD-10-CM | POA: Diagnosis not present

## 2015-05-07 DIAGNOSIS — I714 Abdominal aortic aneurysm, without rupture: Secondary | ICD-10-CM | POA: Diagnosis not present

## 2015-05-07 DIAGNOSIS — I2111 ST elevation (STEMI) myocardial infarction involving right coronary artery: Secondary | ICD-10-CM | POA: Diagnosis not present

## 2015-05-07 DIAGNOSIS — I2119 ST elevation (STEMI) myocardial infarction involving other coronary artery of inferior wall: Secondary | ICD-10-CM | POA: Diagnosis not present

## 2015-05-11 ENCOUNTER — Other Ambulatory Visit: Payer: Self-pay

## 2015-05-11 DIAGNOSIS — I5022 Chronic systolic (congestive) heart failure: Secondary | ICD-10-CM | POA: Diagnosis not present

## 2015-05-11 DIAGNOSIS — D638 Anemia in other chronic diseases classified elsewhere: Secondary | ICD-10-CM | POA: Diagnosis not present

## 2015-05-11 DIAGNOSIS — K219 Gastro-esophageal reflux disease without esophagitis: Secondary | ICD-10-CM | POA: Diagnosis not present

## 2015-05-11 DIAGNOSIS — Z79899 Other long term (current) drug therapy: Secondary | ICD-10-CM | POA: Diagnosis not present

## 2015-05-11 DIAGNOSIS — E118 Type 2 diabetes mellitus with unspecified complications: Secondary | ICD-10-CM | POA: Diagnosis not present

## 2015-05-11 DIAGNOSIS — E7800 Pure hypercholesterolemia, unspecified: Secondary | ICD-10-CM | POA: Diagnosis not present

## 2015-05-11 DIAGNOSIS — E039 Hypothyroidism, unspecified: Secondary | ICD-10-CM | POA: Diagnosis not present

## 2015-05-11 DIAGNOSIS — I482 Chronic atrial fibrillation: Secondary | ICD-10-CM | POA: Diagnosis not present

## 2015-05-11 DIAGNOSIS — I480 Paroxysmal atrial fibrillation: Secondary | ICD-10-CM | POA: Diagnosis not present

## 2015-05-11 NOTE — Patient Outreach (Signed)
[place] [facility]) Care Management  05/11/2015  Derrick Cochran Nov 25, 1935 [id number]   Telephone Screen  Referral Date: 05/10/15 Referral Source: [employer] Referral Reason: disease and symptom management   Outreach attempt #1 to patient. No answer at present. RN CM left HIPAA compliant voicemail message along with contact info.   Plan: RN CM will make outreach attempt to patient within a week.   Enzo Montgomery, RN,BSN,CCM [place] Management Telephonic Care Management Coordinator Direct Phone: [phone] Toll Free: [phone] Fax: [phone]

## 2015-05-11 NOTE — Patient Outreach (Addendum)
[place] [facility]) Care Management  05/11/2015  LALO MONTON [birth year] [id number]   Telephone Screen  Referral Date: 05/10/15 Referral Source: [employer] Referral Reason: disease and symptom management   Incoming call from patient/spouse returning RN CM call.  Discussed referral and [facility] services. Spouse states patient is able to manage his care with some assistance from her. He still drives himself to MD appts. She voices no issues with med management. Patient has low cost co-pays for meds as she uses [employer] order. He is independent with ADLs/IADLs. She states patient had MD appt earlier today and got a good report. Patient is managing his chronic conditions and voices no need for further education or support at this time. Spouse appreciative of call but does not feel like [facility] services are warranted at this time. She was agreeable to receiving [facility] info in the mail to reference in future as needed.  Plan: RN CM will notify [facility] administrative assistant of case status. RN CM will send [facility] brochure & magnet to patient. RN CM will send MD case closure letter.  Enzo Montgomery, RN,BSN,CCM [place] Management Telephonic Care Management Coordinator Direct Phone: [phone] Toll Free: [phone] Fax: [phone]

## 2015-05-20 ENCOUNTER — Ambulatory Visit: Payer: Commercial Managed Care - HMO | Attending: Family | Admitting: Family

## 2015-05-20 ENCOUNTER — Encounter: Payer: Self-pay | Admitting: Family

## 2015-05-20 VITALS — BP 149/69 | HR 83 | Resp 18 | Ht 70.0 in | Wt 184.0 lb

## 2015-05-20 DIAGNOSIS — I252 Old myocardial infarction: Secondary | ICD-10-CM | POA: Diagnosis not present

## 2015-05-20 DIAGNOSIS — Z79899 Other long term (current) drug therapy: Secondary | ICD-10-CM | POA: Insufficient documentation

## 2015-05-20 DIAGNOSIS — Z7901 Long term (current) use of anticoagulants: Secondary | ICD-10-CM | POA: Diagnosis not present

## 2015-05-20 DIAGNOSIS — I5022 Chronic systolic (congestive) heart failure: Secondary | ICD-10-CM | POA: Diagnosis not present

## 2015-05-20 DIAGNOSIS — M1990 Unspecified osteoarthritis, unspecified site: Secondary | ICD-10-CM | POA: Diagnosis not present

## 2015-05-20 DIAGNOSIS — E039 Hypothyroidism, unspecified: Secondary | ICD-10-CM | POA: Diagnosis not present

## 2015-05-20 DIAGNOSIS — I2510 Atherosclerotic heart disease of native coronary artery without angina pectoris: Secondary | ICD-10-CM | POA: Diagnosis not present

## 2015-05-20 DIAGNOSIS — E119 Type 2 diabetes mellitus without complications: Secondary | ICD-10-CM | POA: Insufficient documentation

## 2015-05-20 DIAGNOSIS — Z7984 Long term (current) use of oral hypoglycemic drugs: Secondary | ICD-10-CM | POA: Insufficient documentation

## 2015-05-20 DIAGNOSIS — R0602 Shortness of breath: Secondary | ICD-10-CM | POA: Diagnosis not present

## 2015-05-20 DIAGNOSIS — Z888 Allergy status to other drugs, medicaments and biological substances status: Secondary | ICD-10-CM | POA: Diagnosis not present

## 2015-05-20 DIAGNOSIS — I482 Chronic atrial fibrillation, unspecified: Secondary | ICD-10-CM

## 2015-05-20 DIAGNOSIS — K219 Gastro-esophageal reflux disease without esophagitis: Secondary | ICD-10-CM | POA: Diagnosis not present

## 2015-05-20 DIAGNOSIS — I4891 Unspecified atrial fibrillation: Secondary | ICD-10-CM | POA: Insufficient documentation

## 2015-05-20 DIAGNOSIS — I10 Essential (primary) hypertension: Secondary | ICD-10-CM

## 2015-05-20 DIAGNOSIS — Z7982 Long term (current) use of aspirin: Secondary | ICD-10-CM | POA: Insufficient documentation

## 2015-05-20 DIAGNOSIS — Z9889 Other specified postprocedural states: Secondary | ICD-10-CM | POA: Diagnosis not present

## 2015-05-20 DIAGNOSIS — Z87891 Personal history of nicotine dependence: Secondary | ICD-10-CM | POA: Diagnosis not present

## 2015-05-20 NOTE — Progress Notes (Signed)
Subjective:    Patient ID: Derrick Cochran, male    DOB: 1935-08-03, 80 y.o.   MRN: [id number]  Congestive Heart Failure Presents for follow-up visit. The disease course has been improving. Associated symptoms include fatigue and shortness of breath. Pertinent negatives include no abdominal pain, chest pain, edema, orthopnea or palpitations. The symptoms have been improving. Past treatments include ACE inhibitors, beta blockers and salt and fluid restriction. The treatment provided moderate relief. Compliance with prior treatments has been good. His past medical history is significant for arrhythmia, CAD, DM and HTN. He has one 1st degree relative with heart disease.  Hypertension This is a chronic problem. The current episode started more than 1 year ago. The problem is unchanged. The problem is controlled. Associated symptoms include shortness of breath. Pertinent negatives include no chest pain, headaches, neck pain, palpitations or peripheral edema. There are no associated agents to hypertension. Risk factors for coronary artery disease include diabetes mellitus, family history and male gender. Past treatments include ACE inhibitors, beta blockers, diuretics and lifestyle changes. The current treatment provides moderate improvement. There are no compliance problems.  Hypertensive end-organ damage includes CAD/MI and heart failure.   Past Medical History  Diagnosis Date  . Coronary artery disease   . Hypertension   . Myocardial infarction ([place])   . Hypothyroidism   . Diabetes mellitus without complication ([place])   . GERD (gastroesophageal reflux disease)   . Arthritis   . Heart murmur   . Shortness of breath dyspnea   . Cancer ([place])     skin  . CHF (congestive heart failure) ([place])   . Arrhythmia     atrial fibrillation    Past Surgical History  Procedure Laterality Date  . Coronary artery bypass graft    . Cardiac catheterization    . Appendectomy    . Cardiac valve replacement       Bovine transcatheter heart valve  . Great toe arthrodesis, interphalangeal joint Left   . Pacemaker insertion N/A 07/02/2014    Procedure: INSERTION PACEMAKER/DUAL CHAMBER  INITIAL IMPLANT;  Surgeon: [name], MD;  Location: [facility] ORS;  Service: Cardiovascular;  Laterality: N/A;  . Electrophysiologic study N/A 08/06/2014    Procedure: Cardioversion;  Surgeon: [name], MD;  Location: [place] [facility];  Service: Cardiovascular;  Laterality: N/A;  . Electrophysiologic study N/A 06/18/2014    Procedure: CARDIOVERSION;  Surgeon: [name], MD;  Location: [facility] ORS;  Service: Cardiovascular;  Laterality: N/A;    Family History  Problem Relation Age of Onset  . Cancer Mother   . Cancer Father   . Kidney disease Sister   . Heart failure Brother     Social History  Substance Use Topics  . Smoking status: Former Smoker -- 0.50 packs/day for 15 years    Types: Cigarettes    Quit date: 04/22/1985  . Smokeless tobacco: Former Systems developer    Types: Chew  . Alcohol Use: No    Allergies  Allergen Reactions  . Cefdinir Other (See Comments)    nightmares  . Adhesive [Tape] Rash  . Sulfa Antibiotics Rash  . Sulfamethoxazole Hives, Itching and Rash    Prior to Admission medications   Medication Sig Start Date End Date Taking? Authorizing Provider  acetaminophen (TYLENOL) 500 MG tablet Take 500 mg by mouth every 6 (six) hours as needed for mild pain, moderate pain or fever.    Yes Historical Provider, MD  aspirin EC 81 MG EC tablet Take 1 tablet (81 mg  total) by mouth daily. 04/13/15  Yes Dustin Flock, MD  cyanocobalamin 1000 MCG tablet Take 1,000 mcg by mouth daily.   Yes Historical Provider, MD  enalapril (VASOTEC) 5 MG tablet Take 5 mg by mouth 2 (two) times daily.    Yes Historical Provider, MD  furosemide (LASIX) 20 MG tablet Take 20 mg by mouth every morning.    Yes Historical Provider, MD  gabapentin (NEURONTIN) 300 MG capsule Take 300 mg by mouth daily.     Yes Historical Provider, MD  glipiZIDE (GLUCOTROL XL) 5 MG 24 hr tablet Take 5 mg by mouth every morning. 01/21/15  Yes Historical Provider, MD  levothyroxine (SYNTHROID, LEVOTHROID) 137 MCG tablet Take 137 mcg by mouth daily before breakfast.    Yes Historical Provider, MD  metFORMIN (GLUCOPHAGE) 500 MG tablet Take 500 mg by mouth 2 (two) times daily.    Yes Historical Provider, MD  metoprolol succinate (TOPROL-XL) 25 MG 24 hr tablet Take 25 mg by mouth daily.   Yes Historical Provider, MD  nitroGLYCERIN (NITROSTAT) 0.4 MG SL tablet Place 0.4 mg under the tongue every 5 (five) minutes as needed for chest pain.   Yes Historical Provider, MD  omeprazole (PRILOSEC) 20 MG capsule Take 20 mg by mouth every morning.    Yes Historical Provider, MD  simvastatin (ZOCOR) 40 MG tablet Take 40 mg by mouth every evening.    Yes Historical Provider, MD  sotalol (BETAPACE) 80 MG tablet Take 1 tablet (80 mg total) by mouth 2 (two) times daily. 07/04/14  Yes [name], MD  tamsulosin (FLOMAX) 0.4 MG CAPS capsule Take 0.4 mg by mouth every morning.    Yes Historical Provider, MD  temazepam (RESTORIL) 15 MG capsule Take 15 mg by mouth at bedtime.    Yes Historical Provider, MD  warfarin (COUMADIN) 1 MG tablet Take 1 mg by mouth every evening. Pt takes with 3mg  tablet for a total dose of 4mg .   Yes Historical Provider, MD  warfarin (COUMADIN) 3 MG tablet Take 3 mg by mouth every evening. Pt takes with 1mg  tablet for a total dose of 4mg .   Yes Historical Provider, MD      Review of Systems  Constitutional: Positive for fatigue. Negative for appetite change.  HENT: Positive for rhinorrhea. Negative for congestion and sore throat.   Eyes: Negative.   Respiratory: Positive for shortness of breath. Negative for cough and chest tightness.   Cardiovascular: Negative for chest pain, palpitations and leg swelling.  Gastrointestinal: Negative for abdominal pain and abdominal distention.  Endocrine: Negative.    Genitourinary: Negative.   Musculoskeletal: Negative for back pain and neck pain.  Skin: Negative.   Allergic/Immunologic: Negative.   Neurological: Negative for dizziness, light-headedness and headaches.  Hematological: Negative for adenopathy. Does not bruise/bleed easily.  Psychiatric/Behavioral: Positive for sleep disturbance (difficulty falling asleep). Negative for dysphoric mood. The patient is not nervous/anxious.        Objective:   Physical Exam  Constitutional: He is oriented to person, place, and time. He appears well-developed and well-nourished.  HENT:  Head: Normocephalic and atraumatic.  Eyes: Conjunctivae are normal. Pupils are equal, round, and reactive to light.  Neck: Normal range of motion. Neck supple.  Cardiovascular: Normal rate.  An irregular rhythm present.  Pulmonary/Chest: Effort normal. He has no wheezes. He has no rales.  Abdominal: Soft. He exhibits no distension. There is no tenderness.  Musculoskeletal: He exhibits no edema or tenderness.  Neurological: He is alert and oriented to person,  place, and time.  Skin: Skin is warm and dry.  Psychiatric: He has a normal mood and affect. His behavior is normal. Thought content normal.  Nursing note and vitals reviewed.   BP 149/69 mmHg  Pulse 83  Resp 18  Ht 5\' 10"  (1.778 m)  Wt 184 lb (83.462 kg)  BMI 26.40 kg/m2  SpO2 100%       Assessment & Plan:  1: Chronic heart failure with reduced ejection fraction- Patient presents with fatigue and shortness of breath upon exertion. He does feel like both symptoms are gradually improving. Denies any swelling in his legs or abdomen. He continues to weigh himself and his weight chart shows a stable weight. By our scale, he's lost 5 pounds since he was last here. Reminded to call for an overnight weight gain of >2 pounds or a weekly weight gain of >5 pounds. He is not adding any salt to his food and they are closely reading food labels. He has decreased his  metoprolol from 50mg  three times daily to 25mg  daily & feels better since doing that. Cardiologist is aware. Did ask patient to go home and look at his bottle (as he didn't bring them with him) to see if his metoprolol dose is the extended release version since he's only taking it once daily. Discussed cardiac rehab and entresto with him and he says that he'll talk with his PCP about both of these as he has an appointment with him on 05/21/15. 2: HTN- Blood pressure looks good today. Continue medications at this time. 3: Atrial fibrillation- Currently rate controlled with metoprolol and sotalol. Also takes warfarin daily. Sees his cardiologist again June 2017. 4: Diabetes- His glucose yesterday was 203 and he says that it's because he ate some cake. Again, he sees his PCP on 05/21/15.  Return here in 3 months or sooner for any questions/problems before then.

## 2015-05-20 NOTE — Patient Instructions (Signed)
Continue weighing daily and call for an overnight weight gain of > 2 pounds or a weekly weight gain of >5 pounds. 

## 2015-05-21 DIAGNOSIS — I10 Essential (primary) hypertension: Secondary | ICD-10-CM | POA: Diagnosis not present

## 2015-05-21 DIAGNOSIS — I5022 Chronic systolic (congestive) heart failure: Secondary | ICD-10-CM | POA: Diagnosis not present

## 2015-05-21 DIAGNOSIS — I350 Nonrheumatic aortic (valve) stenosis: Secondary | ICD-10-CM | POA: Diagnosis not present

## 2015-05-21 DIAGNOSIS — I482 Chronic atrial fibrillation: Secondary | ICD-10-CM | POA: Diagnosis not present

## 2015-05-21 DIAGNOSIS — Z9109 Other allergy status, other than to drugs and biological substances: Secondary | ICD-10-CM | POA: Diagnosis not present

## 2015-05-21 DIAGNOSIS — F5104 Psychophysiologic insomnia: Secondary | ICD-10-CM | POA: Diagnosis not present

## 2015-05-21 DIAGNOSIS — E118 Type 2 diabetes mellitus with unspecified complications: Secondary | ICD-10-CM | POA: Diagnosis not present

## 2015-05-23 ENCOUNTER — Other Ambulatory Visit: Payer: Self-pay

## 2015-05-25 ENCOUNTER — Encounter (INDEPENDENT_AMBULATORY_CARE_PROVIDER_SITE_OTHER): Payer: Self-pay

## 2015-05-25 ENCOUNTER — Ambulatory Visit (INDEPENDENT_AMBULATORY_CARE_PROVIDER_SITE_OTHER): Payer: Commercial Managed Care - HMO | Admitting: Surgery

## 2015-05-25 ENCOUNTER — Encounter: Payer: Self-pay | Admitting: Surgery

## 2015-05-25 VITALS — BP 118/74 | HR 77 | Temp 95.4°F | Ht 70.0 in | Wt 185.0 lb

## 2015-05-25 DIAGNOSIS — R159 Full incontinence of feces: Secondary | ICD-10-CM

## 2015-05-25 DIAGNOSIS — K640 First degree hemorrhoids: Secondary | ICD-10-CM | POA: Diagnosis not present

## 2015-05-25 NOTE — Progress Notes (Signed)
Surgical Consultation  05/25/2015  Derrick Cochran is an 80 y.o. male.   CC: Fecal incontinence  HPI: This patient was referred over from [facility] with recommendations for hemorrhoid banding. However the patient describes no bleeding no pain and his chief complaint is fecal incontinence. He thinks he is here in the office to discuss fecal incontinence. He wears a diaper and soils himself quite frequently. He denies fevers or chills and again has no pain and no blood. He was not sure if he's ever had hemorrhoid banding in the past although notes from [facility] suggests that he may have had hemorrhoid banding in the remote past. His had no abdominal surgery other than an appendectomy and has never had any other rectal surgery. He does not use any medications for hemorrhoids. States that he was using some sort of white prescription cream that was quite expensive (suspect Anusol HCC) but that did not help his chief complaint at all.  Past Medical History  Diagnosis Date  . Coronary artery disease   . Hypertension   . Myocardial infarction ([place])   . Hypothyroidism   . Diabetes mellitus without complication ([place])   . GERD (gastroesophageal reflux disease)   . Arthritis   . Heart murmur   . Shortness of breath dyspnea   . Cancer ([place])     skin  . CHF (congestive heart failure) ([place])   . Arrhythmia     atrial fibrillation  . AAA (abdominal aortic aneurysm) ([place])   . Esophageal stricture   . BPH (benign prostatic hyperplasia)   . DDD (degenerative disc disease), thoracic   . Hyperlipidemia     Past Surgical History  Procedure Laterality Date  . Cardiac catheterization  1994 x 2 with PCTA of RCA  . Appendectomy    . Cardiac valve replacement      Bovine transcatheter heart valve  . Great toe arthrodesis, interphalangeal joint Left   . Pacemaker insertion N/A 07/02/2014    Procedure: INSERTION PACEMAKER/DUAL CHAMBER  INITIAL IMPLANT;  Surgeon: [name], MD;  Location: [facility] ORS;  Service:  Cardiovascular;  Laterality: N/A;  . Electrophysiologic study N/A 08/06/2014    Procedure: Cardioversion;  Surgeon: [name], MD;  Location: [facility];  Service: Cardiovascular;  Laterality: N/A;  . Electrophysiologic study N/A 06/18/2014    Procedure: CARDIOVERSION;  Surgeon: [name], MD;  Location: [facility] ORS;  Service: Cardiovascular;  Laterality: N/A;  . Coronary artery bypass graft  1995    x 4 Vessels - [facility]  . Aortic valve replacement  1995    Bioprosthetic - [facility]  . Hernia repair Left     Inguinal  . Colonoscopy  2015  . Esophagogastroduodenoscopy  2014    Family History  Problem Relation Age of Onset  . Cancer Mother   . Cancer Father   . Kidney disease Sister   . Heart failure Brother     Social History:  reports that he quit smoking about 30 years ago. His smoking use included Cigarettes. He has a 7.5 pack-year smoking history. He has quit using smokeless tobacco. His smokeless tobacco use included Chew. He reports that he does not drink alcohol or use illicit drugs.  Allergies:  Allergies  Allergen Reactions  . Cefdinir Other (See Comments)    nightmares  . Adhesive [Tape] Rash  . Sulfa Antibiotics Rash  . Sulfamethoxazole Hives, Itching and Rash    Medications reviewed.   Review of Systems:   ROS   Physical Exam:  BP 118/74 mmHg  Pulse 77  Temp(Src) 95.4 F (35.2 C) (Oral)  Ht 5\' 10"  (1.778 m)  Wt 185 lb (83.915 kg)  BMI 26.54 kg/m2  Physical Exam  Constitutional: He is oriented to person, place, and time and well-developed, well-nourished, and in no distress. No distress.  HENT:  Head: Normocephalic and atraumatic.  Eyes: Pupils are equal, round, and reactive to light. Right eye exhibits no discharge. Left eye exhibits no discharge. No scleral icterus.  Neck: Normal range of motion.  Cardiovascular:  Murmur heard. Irregular rate and irregular rhythm with a valve sounds.  Pulmonary/Chest: Effort normal and breath  sounds normal. No respiratory distress. He has no wheezes. He has no rales.  Abdominal: Soft. He exhibits no distension. There is no tenderness.  Genitourinary:  Inspection and digital rectal exam demonstrates no external tags and no internal hemorrhoids to speak of although very minimal hemorrhoidal tissue present internally. No blood noted no stool for testing. Patient has very poor rectal tone.  Musculoskeletal: Normal range of motion. He exhibits no edema.  Lymphadenopathy:    He has no cervical adenopathy.  Neurological: He is alert and oriented to person, place, and time.  Skin: Skin is warm and dry. He is not diaphoretic.  Psychiatric: Mood and affect normal.  Vitals reviewed.     No results found for this or any previous visit (from the past 48 hour(s)). No results found.  Assessment/Plan:  Patient referred over for hemorrhoid banding but his chief complaint is incontinence of feces and is quite frequent and annoying and is wearing a diaper. I see no evidence that hemorrhoidectomy would assist in this patient's problem and could make this worse. I would recommend that the patient be referred to [facility] colorectal surgery for consideration of biofeedback or some other sort of methods to improve rectal tone but this may be an age related symptom and process that cannot be corrected. He does not need to follow-up with [facility].  Florene Glen, MD, FACS

## 2015-05-25 NOTE — Patient Instructions (Signed)
We will send you back to [facility] with hopes that you may be referred to [facility] for some alternative treatments for your fecal incontinence problem.

## 2015-05-26 ENCOUNTER — Encounter: Payer: Commercial Managed Care - HMO | Attending: Internal Medicine | Admitting: *Deleted

## 2015-05-26 ENCOUNTER — Encounter: Payer: Self-pay | Admitting: *Deleted

## 2015-05-26 VITALS — BP 110/70 | HR 78 | Ht 70.0 in | Wt 183.3 lb

## 2015-05-26 DIAGNOSIS — I5022 Chronic systolic (congestive) heart failure: Secondary | ICD-10-CM | POA: Diagnosis not present

## 2015-05-26 NOTE — Progress Notes (Signed)
Cardiac Individual Treatment Plan  Patient Details  Name: Derrick Cochran MRN: [id number] Date of Birth: [birth year] Referring Provider:  [name], MD  Initial Encounter Date:       Cardiac Rehab from 05/26/2015 in [facility] Cardiac [facility] Re[facility]   Date  05/26/15      Visit Diagnosis: Chronic systolic congestive heart failure ([place])  Patient's Home Medications on Admission:  Current outpatient prescriptions:  .  acetaminophen (TYLENOL) 500 MG tablet, Take 500 mg by mouth every 6 (six) hours as needed for mild pain, moderate pain or fever. , Disp: , Rfl:  .  aspirin EC 81 MG EC tablet, Take 1 tablet (81 mg total) by mouth daily., Disp: , Rfl:  .  cyanocobalamin 1000 MCG tablet, Take 1,000 mcg by mouth daily., Disp: , Rfl:  .  enalapril (VASOTEC) 5 MG tablet, Take 5 mg by mouth 2 (two) times daily. , Disp: , Rfl:  .  furosemide (LASIX) 20 MG tablet, Take 20 mg by mouth every morning. , Disp: , Rfl:  .  gabapentin (NEURONTIN) 300 MG capsule, Take 300 mg by mouth daily. , Disp: , Rfl:  .  glipiZIDE (GLUCOTROL XL) 5 MG 24 hr tablet, Take 5 mg by mouth every morning., Disp: , Rfl:  .  levothyroxine (SYNTHROID, LEVOTHROID) 137 MCG tablet, Take 137 mcg by mouth daily before breakfast. , Disp: , Rfl:  .  loratadine (CLARITIN) 10 MG tablet, Take 1 tablet by mouth daily., Disp: , Rfl:  .  metFORMIN (GLUCOPHAGE) 500 MG tablet, Take 500 mg by mouth 2 (two) times daily. , Disp: , Rfl:  .  metoprolol succinate (TOPROL-XL) 25 MG 24 hr tablet, Take 25 mg by mouth daily., Disp: , Rfl:  .  nitroGLYCERIN (NITROSTAT) 0.4 MG SL tablet, Place 0.4 mg under the tongue every 5 (five) minutes as needed for chest pain., Disp: , Rfl:  .  omeprazole (PRILOSEC) 20 MG capsule, Take 20 mg by mouth every morning. , Disp: , Rfl:  .  simvastatin (ZOCOR) 40 MG tablet, Take 40 mg by mouth every evening. , Disp: , Rfl:  .  sotalol (BETAPACE) 80 MG tablet, Take 1 tablet (80 mg total) by mouth 2 (two) times daily.,  Disp: 60 tablet, Rfl: 5 .  tamsulosin (FLOMAX) 0.4 MG CAPS capsule, Take 0.4 mg by mouth every morning. , Disp: , Rfl:  .  temazepam (RESTORIL) 15 MG capsule, Take 15 mg by mouth at bedtime. , Disp: , Rfl:  .  warfarin (COUMADIN) 1 MG tablet, Take 1 mg by mouth every evening. Pt takes with 3mg  tablet for a total dose of 4mg ., Disp: , Rfl:  .  warfarin (COUMADIN) 3 MG tablet, Take 3 mg by mouth every evening. Pt takes with 1mg  tablet for a total dose of 4mg ., Disp: , Rfl:   Past Medical History: Past Medical History  Diagnosis Date  . Coronary artery disease   . Hypertension   . Myocardial infarction ([place])   . Hypothyroidism   . Diabetes mellitus without complication ([place])   . GERD (gastroesophageal reflux disease)   . Arthritis   . Heart murmur   . Shortness of breath dyspnea   . Cancer ([place])     skin  . CHF (congestive heart failure) ([place])   . Arrhythmia     atrial fibrillation  . AAA (abdominal aortic aneurysm) ([place])   . Esophageal stricture   . BPH (benign prostatic hyperplasia)   . DDD (degenerative disc disease), thoracic   .  Hyperlipidemia     Tobacco Use: History  Smoking status  . Former Smoker -- 0.50 packs/day for 15 years  . Types: Cigarettes  . Quit date: 04/22/1985  Smokeless tobacco  . Former Systems developer  . Types: Chew    Labs: Recent Review Flowsheet Data    There is no flowsheet data to display.       Exercise Target Goals: Date: 05/26/15  Exercise Program Goal: Individual exercise prescription set with THRR, safety & activity barriers. Participant demonstrates ability to understand and report RPE using BORG scale, to self-measure pulse accurately, and to acknowledge the importance of the exercise prescription.  Exercise Prescription Goal: Starting with aerobic activity 30 plus minutes a day, 3 days per week for initial exercise prescription. Provide home exercise prescription and guidelines that participant acknowledges understanding prior to  discharge.  Activity Barriers & Risk Stratification:     Activity Barriers & Cardiac Risk Stratification - 05/26/15 1253    Activity Barriers & Cardiac Risk Stratification   Activity Barriers Arthritis;Back Problems;Shortness of Breath   Cardiac Risk Stratification High      6 Minute Walk:     6 Minute Walk      05/26/15 1209       6 Minute Walk   Phase Initial     Distance 1075 feet     Walk Time 6 minutes     RPE 11     Perceived Dyspnea  3     Symptoms Yes (comment)  "just my legs and hips feel weak"     Resting HR 78 bpm     Resting BP 110/70 mmHg     Max Ex. HR 115 bpm     Max Ex. BP 102/60 mmHg        Initial Exercise Prescription:     Initial Exercise Prescription - 05/26/15 1300    Date of Initial Exercise Prescription   Date 05/26/15   Treadmill   MPH 1.5   Grade 0   Minutes 15   Bike   Level 0.5   Minutes 15   Recumbant Bike   Level 2   Watts 40   NuStep   Level 2   Watts 40   Minutes 15   Arm Ergometer   Level 1   Watts 10   Minutes 15   Arm/Foot Ergometer   Level 4   Watts 12   Minutes 15   Cybex   Level 2   RPM 50   Minutes 15   Recumbant Elliptical   Level 1   Watts 10   Minutes 15   REL-XR   Level 1   Watts 40   Minutes 15   Biostep-RELP   Level 1   Watts 15   Minutes 15   Prescription Details   Frequency (times per week) 3   Duration Progress to 50 minutes of aerobic without signs/symptoms of physical distress   Intensity   THRR REST +  30   Ratings of Perceived Exertion 11-15   Perceived Dyspnea 0-4   Progression   Progression Continue to progress workloads to maintain intensity without signs/symptoms of physical distress.   Resistance Training   Training Prescription Yes   Weight 2   Reps 10-15      Perform Capillary Blood Glucose checks as needed.  Exercise Prescription Changes:   Exercise Comments:   Discharge Exercise Prescription (Final Exercise Prescription Changes):   Nutrition:  Target  Goals: Understanding of nutrition guidelines, daily intake  of sodium 1500mg , cholesterol 200mg , calories 30% from fat and 7% or less from saturated fats, daily to have 5 or more servings of fruits and vegetables.  Biometrics:     Pre Biometrics - 05/26/15 1210    Pre Biometrics   Waist Circumference 41.5 inches   Hip Circumference 39.2 inches   Waist to Hip Ratio 1.06 %       Nutrition Therapy Plan and Nutrition Goals:   Nutrition Discharge: Rate Your Plate Scores:   Nutrition Goals Re-Evaluation:   Psychosocial: Target Goals: Acknowledge presence or absence of depression, maximize coping skills, provide positive support system. Participant is able to verbalize types and ability to use techniques and skills needed for reducing stress and depression.  Initial Review & Psychosocial Screening:     Initial Psych Review & Screening - 05/26/15 1436    Screening Interventions   Interventions --  Patient denies suicidal ideation.        Quality of Life Scores:   PHQ-9:     Recent Review Flowsheet Data    Depression screen [facility] 2/9 05/26/2015 05/20/2015 04/23/2015   Decreased Interest 3 0 0   Down, Depressed, Hopeless 2 0 0   PHQ - 2 Score 5 0 0   Altered sleeping 3 - -   Tired, decreased energy 3 - -   Change in appetite 2 - -   Feeling bad or failure about yourself  2 - -   Trouble concentrating 0 - -   Moving slowly or fidgety/restless 2 - -   Suicidal thoughts 0 - -   PHQ-9 Score 17 - -   Difficult doing work/chores Somewhat difficult - -      Psychosocial Evaluation and Intervention:   Psychosocial Re-Evaluation:   Vocational Rehabilitation: Provide vocational rehab assistance to qualifying candidates.   Vocational Rehab Evaluation & Intervention:     Vocational Rehab - 05/26/15 1255    Initial Vocational Rehab Evaluation & Intervention   Assessment shows need for Vocational Rehabilitation No      Education: Education Goals: Education classes  will be provided on a weekly basis, covering required topics. Participant will state understanding/return demonstration of topics presented.  Learning Barriers/Preferences:     Learning Barriers/Preferences - 05/26/15 1254    Learning Barriers/Preferences   Learning Barriers Hearing;Exercise Concerns   Learning Preferences None      Education Topics: General Nutrition Guidelines/Fats and Fiber: -Group instruction provided by verbal, written material, models and posters to present the general guidelines for heart healthy nutrition. Gives an explanation and review of dietary fats and fiber.   Controlling Sodium/Reading Food Labels: -Group verbal and written material supporting the discussion of sodium use in heart healthy nutrition. Review and explanation with models, verbal and written materials for utilization of the food label.   Exercise Physiology & Risk Factors: - Group verbal and written instruction with models to review the exercise physiology of the cardiovascular system and associated critical values. Details cardiovascular disease risk factors and the goals associated with each risk factor.   Aerobic Exercise & Resistance Training: - Gives group verbal and written discussion on the health impact of inactivity. On the components of aerobic and resistive training programs and the benefits of this training and how to safely progress through these programs.   Flexibility, Balance, General Exercise Guidelines: - Provides group verbal and written instruction on the benefits of flexibility and balance training programs. Provides general exercise guidelines with specific guidelines to those with heart or lung disease.  Demonstration and skill practice provided.   Stress Management: - Provides group verbal and written instruction about the health risks of elevated stress, cause of high stress, and healthy ways to reduce stress.   Depression: - Provides group verbal and written  instruction on the correlation between heart/lung disease and depressed mood, treatment options, and the stigmas associated with seeking treatment.   Anatomy & Physiology of the Heart: - Group verbal and written instruction and models provide basic cardiac anatomy and physiology, with the coronary electrical and arterial systems. Review of: AMI, Angina, Valve disease, Heart Failure, Cardiac Arrhythmia, Pacemakers, and the ICD.   Cardiac Procedures: - Group verbal and written instruction and models to describe the testing methods done to diagnose heart disease. Reviews the outcomes of the test results. Describes the treatment choices: Medical Management, Angioplasty, or Coronary Bypass Surgery.   Cardiac Medications: - Group verbal and written instruction to review commonly prescribed medications for heart disease. Reviews the medication, class of the drug, and side effects. Includes the steps to properly store meds and maintain the prescription regimen.   Go Sex-Intimacy & Heart Disease, Get SMART - Goal Setting: - Group verbal and written instruction through game format to discuss heart disease and the return to sexual intimacy. Provides group verbal and written material to discuss and apply goal setting through the application of the S.M.A.R.T. Method.   Other Matters of the Heart: - Provides group verbal, written materials and models to describe Heart Failure, Angina, Valve Disease, and Diabetes in the realm of heart disease. Includes description of the disease process and treatment options available to the cardiac patient.   Exercise & Equipment Safety: - Individual verbal instruction and demonstration of equipment use and safety with use of the equipment.          Cardiac Rehab from 05/26/2015 in [facility] Cardiac and Pulmonary Rehab   Date  05/26/15   Educator  D. Joya Gaskins, Derrick Cochran   Instruction Review Code  1- partially meets, needs review/practice      Infection Prevention: - Provides  verbal and written material to individual with discussion of infection control including proper hand washing and proper equipment cleaning during exercise session.      Cardiac Rehab from 05/26/2015 in [facility] Cardiac and Pulmonary Rehab   Date  05/26/15   Educator  D. Joya Gaskins, Derrick Cochran   Instruction Review Code  2- meets goals/outcomes      Falls Prevention: - Provides verbal and written material to individual with discussion of falls prevention and safety.      Cardiac Rehab from 05/26/2015 in [facility] Cardiac and Pulmonary Rehab   Date  05/26/15   Educator  D. Joya Gaskins, Derrick Cochran   Instruction Review Code  2- meets goals/outcomes      Diabetes: - Individual verbal and written instruction to review signs/symptoms of diabetes, desired ranges of glucose level fasting, after meals and with exercise. Advice that pre and post exercise glucose checks will be done for 3 sessions at entry of program.      Cardiac Rehab from 05/26/2015 in [facility] Cardiac and Pulmonary Rehab   Date  05/26/15   Educator  D. Joya Gaskins, Derrick Cochran   Instruction Review Code  2- meets goals/outcomes       Knowledge Questionnaire Score:   Core Components/Risk Factors/Patient Goals at Admission:     Personal Goals and Risk Factors at Admission - 05/26/15 1259    Core Components/Risk Factors/Patient Goals on Admission    Weight Management Yes   Intervention  Weight Management: Develop a combined nutrition and exercise program designed to reach desired caloric intake, while maintaining appropriate intake of nutrient and fiber, sodium and fats, and appropriate energy expenditure required for the weight goal.;Weight Management: Provide education and appropriate resources to help participant work on and attain dietary goals.;Weight Management/Obesity: Establish reasonable short term and long term weight goals.   Admit Weight 183 lb 4.8 oz (83.144 kg)   Goal Weight: Short Term 183 lb 4.8 oz (83.144 kg)   Goal Weight: Long Term 183 lb 4.8 oz (83.144 kg)    Expected Outcomes Short Term: Continue to assess and modify interventions until short term weight is achieved;Long Term: Adherence to nutrition and physical activity/exercise program aimed toward attainment of established weight goal;Weight Maintenance: Understanding of the daily nutrition guidelines, which includes 25-35% calories from fat, 7% or less cal from saturated fats, less than 200mg  cholesterol, less than 1.5gm of sodium, & 5 or more servings of fruits and vegetables daily;Understanding recommendations for meals to include 15-35% energy as protein, 25-35% energy from fat, 35-60% energy from carbohydrates, less than 200mg  of dietary cholesterol, 20-35 gm of total fiber daily;Understanding of distribution of calorie intake throughout the day with the consumption of 4-5 meals/snacks   Sedentary Yes   Intervention Provide advice, education, support and counseling about physical activity/exercise needs.;Develop an individualized exercise prescription for aerobic and resistive training based on initial evaluation findings, risk stratification, comorbidities and participant's personal goals.   Expected Outcomes Achievement of increased cardiorespiratory fitness and enhanced flexibility, muscular endurance and strength shown through measurements of functional capacity and personal statement of participant.   Increase Strength and Stamina Yes   Intervention Develop an individualized exercise prescription for aerobic and resistive training based on initial evaluation findings, risk stratification, comorbidities and participant's personal goals.;Provide advice, education, support and counseling about physical activity/exercise needs.   Expected Outcomes Achievement of increased cardiorespiratory fitness and enhanced flexibility, muscular endurance and strength shown through measurements of functional capacity and personal statement of participant.   Improve shortness of breath with ADL's Yes   Intervention  Provide education, individualized exercise plan and daily activity instruction to help decrease symptoms of SOB with activities of daily living.   Expected Outcomes Short Term: Achieves a reduction of symptoms when performing activities of daily living.   Diabetes Yes   Intervention Provide education about signs/symptoms and action to take for hypo/hyperglycemia.;Provide education about proper nutrition, including hydration, and aerobic/resistive exercise prescription along with prescribed medications to achieve blood glucose in normal ranges: Fasting glucose 65-99 mg/dL   Expected Outcomes Short Term: Participant verbalizes understanding of the signs/symptoms and immediate care of hyper/hypoglycemia, proper foot care and importance of medication, aerobic/resistive exercise and nutrition plan for blood glucose control.;Long Term: Attainment of HbA1C < 7%.   Heart Failure Yes   Intervention Provide a combined exercise and nutrition program that is supplemented with education, support and counseling about heart failure. Directed toward relieving symptoms such as shortness of breath, decreased exercise tolerance, and extremity edema.   Expected Outcomes Improve functional capacity of life;Short term: Attendance in program 2-3 days a week with increased exercise capacity. Reported lower sodium intake. Reported increased fruit and vegetable intake. Reports medication compliance.;Short term: Daily weights obtained and reported for increase. Utilizing diuretic protocols set by physician.;Long term: Adoption of self-care skills and reduction of barriers for early signs and symptoms recognition and intervention leading to self-care maintenance.   Hypertension Yes   Intervention Provide education on lifestyle modifcations including regular physical activity/exercise,  weight management, moderate sodium restriction and increased consumption of fresh fruit, vegetables, and low fat dairy, alcohol moderation, and smoking  cessation.;Monitor prescription use compliance.   Expected Outcomes Short Term: Continued assessment and intervention until BP is < 140/39mm HG in hypertensive participants. < 130/13mm HG in hypertensive participants with diabetes, heart failure or chronic kidney disease.;Long Term: Maintenance of blood pressure at goal levels.   Lipids Yes   Intervention Provide education and support for participant on nutrition & aerobic/resistive exercise along with prescribed medications to achieve LDL 70mg , HDL >40mg .   Expected Outcomes Short Term: Participant states understanding of desired cholesterol values and is compliant with medications prescribed. Participant is following exercise prescription and nutrition guidelines.;Long Term: Cholesterol controlled with medications as prescribed, with individualized exercise RX and with personalized nutrition plan. Value goals: LDL < 70mg , HDL > 40 mg.      Core Components/Risk Factors/Patient Goals Review:    Core Components/Risk Factors/Patient Goals at Discharge (Final Review):    ITP Comments:   Comments:  Derrick Cochran was accompanied to Cardiac Rehab Orientation today by his wife Derrick Cochran.  Derrick Cochran plans to start Cardiac Rehab on Wednesday, June 02, 2015 at 8:00 a.m.  Derrick Cochran plans to attend the educational sessions with Derrick Cochran.  Derrick Cochran presents with diagnosis of Chronic Systolic Heart Failure and therefore,  weight management is important in terms of assessing fluid status as well as shortness of breath in monitoring his heart failure.  Borg Dyspnea Scale during 6 minute walk was 3 (moderate).  Handsome is hoping to increase his overall stamina and leg strength through participating in Cardiac Rehab.  Antiono will complete Quality of Life, Rate Your Plate, and Cardiac Rehab Questionnaires at home and bring them back on his start date, June 02, 2015.

## 2015-05-26 NOTE — Patient Instructions (Signed)
Patient Instructions  Patient Details  Name: Derrick Cochran MRN: [id number] Date of Birth: 1935/08/31 Referring Provider:  [name], MD  Below are the personal goals you chose as well as exercise and nutrition goals. Our goal is to help you keep on track towards obtaining and maintaining your goals. We will be discussing your progress on these goals with you throughout the program.  Initial Exercise Prescription:     Initial Exercise Prescription - 05/26/15 1300    Date of Initial Exercise Prescription   Date 05/26/15   Treadmill   MPH 1.5   Grade 0   Minutes 15   Bike   Level 0.5   Minutes 15   Recumbant Bike   Level 2   Watts 40   NuStep   Level 2   Watts 40   Minutes 15   Arm Ergometer   Level 1   Watts 10   Minutes 15   Arm/Foot Ergometer   Level 4   Watts 12   Minutes 15   Cybex   Level 2   RPM 50   Minutes 15   Recumbant Elliptical   Level 1   Watts 10   Minutes 15   REL-XR   Level 1   Watts 40   Minutes 15   Biostep-RELP   Level 1   Watts 15   Minutes 15   Prescription Details   Frequency (times per week) 3   Duration Progress to 50 minutes of aerobic without signs/symptoms of physical distress   Intensity   THRR REST +  30   Ratings of Perceived Exertion 11-15   Perceived Dyspnea 0-4   Progression   Progression Continue to progress workloads to maintain intensity without signs/symptoms of physical distress.   Resistance Training   Training Prescription Yes   Weight 2   Reps 10-15      Exercise Goals: Frequency: Be able to perform aerobic exercise three times per week working toward 3-5 days per week.  Intensity: Work with a perceived exertion of 11 (fairly light) - 15 (hard) as tolerated. Follow your new exercise prescription and watch for changes in prescription as you progress with the program. Changes will be reviewed with you when they are made.  Duration: You should be able to do 30 minutes of continuous aerobic exercise  in addition to a 5 minute warm-up and a 5 minute cool-down routine.  Nutrition Goals: Your personal nutrition goals will be established when you do your nutrition analysis with the dietician.  The following are nutrition guidelines to follow: Cholesterol < 200mg /day Sodium < 1500mg /day Fiber: Men over 50 yrs - 30 grams per day  Personal Goals:     Personal Goals and Risk Factors at Admission - 05/26/15 1259    Core Components/Risk Factors/Patient Goals on Admission    Weight Management Yes   Intervention Weight Management: Develop a combined nutrition and exercise program designed to reach desired caloric intake, while maintaining appropriate intake of nutrient and fiber, sodium and fats, and appropriate energy expenditure required for the weight goal.;Weight Management: Provide education and appropriate resources to help participant work on and attain dietary goals.;Weight Management/Obesity: Establish reasonable short term and long term weight goals.   Admit Weight 183 lb 4.8 oz (83.144 kg)   Goal Weight: Short Term 183 lb 4.8 oz (83.144 kg)   Goal Weight: Long Term 183 lb 4.8 oz (83.144 kg)   Expected Outcomes Short Term: Continue to assess and modify interventions  until short term weight is achieved;Long Term: Adherence to nutrition and physical activity/exercise program aimed toward attainment of established weight goal;Weight Maintenance: Understanding of the daily nutrition guidelines, which includes 25-35% calories from fat, 7% or less cal from saturated fats, less than 200mg  cholesterol, less than 1.5gm of sodium, & 5 or more servings of fruits and vegetables daily;Understanding recommendations for meals to include 15-35% energy as protein, 25-35% energy from fat, 35-60% energy from carbohydrates, less than 200mg  of dietary cholesterol, 20-35 gm of total fiber daily;Understanding of distribution of calorie intake throughout the day with the consumption of 4-5 meals/snacks   Sedentary  Yes   Intervention Provide advice, education, support and counseling about physical activity/exercise needs.;Develop an individualized exercise prescription for aerobic and resistive training based on initial evaluation findings, risk stratification, comorbidities and participant's personal goals.   Expected Outcomes Achievement of increased cardiorespiratory fitness and enhanced flexibility, muscular endurance and strength shown through measurements of functional capacity and personal statement of participant.   Increase Strength and Stamina Yes   Intervention Develop an individualized exercise prescription for aerobic and resistive training based on initial evaluation findings, risk stratification, comorbidities and participant's personal goals.;Provide advice, education, support and counseling about physical activity/exercise needs.   Expected Outcomes Achievement of increased cardiorespiratory fitness and enhanced flexibility, muscular endurance and strength shown through measurements of functional capacity and personal statement of participant.   Improve shortness of breath with ADL's Yes   Intervention Provide education, individualized exercise plan and daily activity instruction to help decrease symptoms of SOB with activities of daily living.   Expected Outcomes Short Term: Achieves a reduction of symptoms when performing activities of daily living.   Diabetes Yes   Intervention Provide education about signs/symptoms and action to take for hypo/hyperglycemia.;Provide education about proper nutrition, including hydration, and aerobic/resistive exercise prescription along with prescribed medications to achieve blood glucose in normal ranges: Fasting glucose 65-99 mg/dL   Expected Outcomes Short Term: Participant verbalizes understanding of the signs/symptoms and immediate care of hyper/hypoglycemia, proper foot care and importance of medication, aerobic/resistive exercise and nutrition plan for  blood glucose control.;Long Term: Attainment of HbA1C < 7%.   Heart Failure Yes   Intervention Provide a combined exercise and nutrition program that is supplemented with education, support and counseling about heart failure. Directed toward relieving symptoms such as shortness of breath, decreased exercise tolerance, and extremity edema.   Expected Outcomes Improve functional capacity of life;Short term: Attendance in program 2-3 days a week with increased exercise capacity. Reported lower sodium intake. Reported increased fruit and vegetable intake. Reports medication compliance.;Short term: Daily weights obtained and reported for increase. Utilizing diuretic protocols set by physician.;Long term: Adoption of self-care skills and reduction of barriers for early signs and symptoms recognition and intervention leading to self-care maintenance.   Hypertension Yes   Intervention Provide education on lifestyle modifcations including regular physical activity/exercise, weight management, moderate sodium restriction and increased consumption of fresh fruit, vegetables, and low fat dairy, alcohol moderation, and smoking cessation.;Monitor prescription use compliance.   Expected Outcomes Short Term: Continued assessment and intervention until BP is < 140/15mm HG in hypertensive participants. < 130/58mm HG in hypertensive participants with diabetes, heart failure or chronic kidney disease.;Long Term: Maintenance of blood pressure at goal levels.   Lipids Yes   Intervention Provide education and support for participant on nutrition & aerobic/resistive exercise along with prescribed medications to achieve LDL 70mg , HDL >40mg .   Expected Outcomes Short Term: Participant states understanding of desired cholesterol values  and is compliant with medications prescribed. Participant is following exercise prescription and nutrition guidelines.;Long Term: Cholesterol controlled with medications as prescribed, with  individualized exercise RX and with personalized nutrition plan. Value goals: LDL < 70mg , HDL > 40 mg.      Tobacco Use Initial Evaluation: History  Smoking status  . Former Smoker -- 0.50 packs/day for 15 years  . Types: Cigarettes  . Quit date: 04/22/1985  Smokeless tobacco  . Former Systems developer  . Types: Chew    Copy of goals given to participant.

## 2015-05-31 DIAGNOSIS — I482 Chronic atrial fibrillation: Secondary | ICD-10-CM | POA: Diagnosis not present

## 2015-06-02 ENCOUNTER — Telehealth: Payer: Self-pay | Admitting: *Deleted

## 2015-06-02 ENCOUNTER — Encounter: Payer: Commercial Managed Care - HMO | Admitting: *Deleted

## 2015-06-02 DIAGNOSIS — I5022 Chronic systolic (congestive) heart failure: Secondary | ICD-10-CM | POA: Diagnosis not present

## 2015-06-02 LAB — GLUCOSE, CAPILLARY: GLUCOSE-CAPILLARY: 85 mg/dL (ref 65–99)

## 2015-06-02 NOTE — Progress Notes (Signed)
Daily Session Note  Patient Details  Name: Derrick Cochran MRN: [id number] Date of Birth: [birth year] Referring Provider:    Encounter Date: 06/02/2015  Check In:     Session Check In - 06/02/15 0839    Check-In   Staff Present Heath Lark, RN, BSN, CCRP;Carroll Enterkin, RN, Alex Gardener, DPT, CEEA   Supervising physician immediately available to respond to emergencies See telemetry face sheet for immediately available ER MD   Medication changes reported     No   Fall or balance concerns reported    No   Warm-up and Cool-down Performed on first and last piece of equipment   VAD Patient? No   Pain Assessment   Currently in Pain? No/denies         Goals Met:  Exercise tolerated well Personal goals reviewed No report of cardiac concerns or symptoms  Goals Unmet:  Not Applicable  Comments: First session  Dr. Emily Filbert is Medical Director for [facility] and [facility].

## 2015-06-02 NOTE — Telephone Encounter (Signed)
I left a vm to check on his medicine that Berle Mull mentioned since it is not in any notes.

## 2015-06-04 ENCOUNTER — Encounter: Payer: Commercial Managed Care - HMO | Admitting: *Deleted

## 2015-06-04 DIAGNOSIS — I5022 Chronic systolic (congestive) heart failure: Secondary | ICD-10-CM | POA: Diagnosis not present

## 2015-06-04 LAB — GLUCOSE, CAPILLARY
GLUCOSE-CAPILLARY: 93 mg/dL (ref 65–99)
Glucose-Capillary: 131 mg/dL — ABNORMAL HIGH (ref 65–99)

## 2015-06-04 NOTE — Progress Notes (Signed)
Daily Session Note  Patient Details  Name: Derrick Cochran MRN: [id number] Date of Birth: 09-10-1935 Referring Provider:    Encounter Date: 06/04/2015  Check In:     Session Check In - 06/04/15 0908    Check-In   Staff Present Heath Lark, RN, BSN, CCRP;Carroll Enterkin, RN, Alex Gardener, DPT, [place] physician immediately available to respond to emergencies See telemetry face sheet for immediately available ER MD   Medication changes reported     No   Fall or balance concerns reported    No   Warm-up and Cool-down Performed on first and last piece of equipment   VAD Patient? No   Pain Assessment   Currently in Pain? No/denies         Goals Met:  Exercise tolerated well Personal goals reviewed No report of cardiac concerns or symptoms Strength training completed today  Goals Unmet:  Not Applicable  Comments: Doing well with exercise prescription progression.    Dr. Emily Filbert is Medical Director for [facility] and [facility].

## 2015-06-06 ENCOUNTER — Encounter: Payer: Self-pay | Admitting: *Deleted

## 2015-06-06 DIAGNOSIS — I5022 Chronic systolic (congestive) heart failure: Secondary | ICD-10-CM

## 2015-06-06 NOTE — Progress Notes (Signed)
Cardiac Individual Treatment Plan  Patient Details  Name: Derrick Cochran MRN: [id number] Date of Birth: 03-10-1935 Referring Provider:    Initial Encounter Date:       Cardiac Rehab from 05/26/2015 in [facility] Cardiac [facility] Rehab   Date  05/26/15      Visit Diagnosis: Chronic systolic congestive heart failure ([place])  Patient's Home Medications on Admission:  Current outpatient prescriptions:  .  acetaminophen (TYLENOL) 500 MG tablet, Take 500 mg by mouth every 6 (six) hours as needed for mild pain, moderate pain or fever. , Disp: , Rfl:  .  aspirin EC 81 MG EC tablet, Take 1 tablet (81 mg total) by mouth daily., Disp: , Rfl:  .  cyanocobalamin 1000 MCG tablet, Take 1,000 mcg by mouth daily., Disp: , Rfl:  .  enalapril (VASOTEC) 5 MG tablet, Take 5 mg by mouth 2 (two) times daily. , Disp: , Rfl:  .  furosemide (LASIX) 20 MG tablet, Take 20 mg by mouth every morning. , Disp: , Rfl:  .  gabapentin (NEURONTIN) 300 MG capsule, Take 300 mg by mouth daily. , Disp: , Rfl:  .  glipiZIDE (GLUCOTROL XL) 5 MG 24 hr tablet, Take 5 mg by mouth every morning., Disp: , Rfl:  .  levothyroxine (SYNTHROID, LEVOTHROID) 137 MCG tablet, Take 137 mcg by mouth daily before breakfast. , Disp: , Rfl:  .  loratadine (CLARITIN) 10 MG tablet, Take 1 tablet by mouth daily., Disp: , Rfl:  .  metFORMIN (GLUCOPHAGE) 500 MG tablet, Take 500 mg by mouth 2 (two) times daily. , Disp: , Rfl:  .  metoprolol succinate (TOPROL-XL) 25 MG 24 hr tablet, Take 25 mg by mouth daily., Disp: , Rfl:  .  nitroGLYCERIN (NITROSTAT) 0.4 MG SL tablet, Place 0.4 mg under the tongue every 5 (five) minutes as needed for chest pain., Disp: , Rfl:  .  omeprazole (PRILOSEC) 20 MG capsule, Take 20 mg by mouth every morning. , Disp: , Rfl:  .  simvastatin (ZOCOR) 40 MG tablet, Take 40 mg by mouth every evening. , Disp: , Rfl:  .  sotalol (BETAPACE) 80 MG tablet, Take 1 tablet (80 mg total) by mouth 2 (two) times daily., Disp: 60 tablet, Rfl:  5 .  tamsulosin (FLOMAX) 0.4 MG CAPS capsule, Take 0.4 mg by mouth every morning. , Disp: , Rfl:  .  temazepam (RESTORIL) 15 MG capsule, Take 15 mg by mouth at bedtime. , Disp: , Rfl:  .  warfarin (COUMADIN) 1 MG tablet, Take 1 mg by mouth every evening. Pt takes with 74m tablet for a total dose of 475m, Disp: , Rfl:  .  warfarin (COUMADIN) 3 MG tablet, Take 3 mg by mouth every evening. Pt takes with 20m86mablet for a total dose of 4mg68mDisp: , Rfl:   Past Medical History: Past Medical History  Diagnosis Date  . Coronary artery disease   . Hypertension   . Myocardial infarction (HCC)[place]. Hypothyroidism   . Diabetes mellitus without complication (HCC)[place]. GERD (gastroesophageal reflux disease)   . Arthritis   . Heart murmur   . Shortness of breath dyspnea   . Cancer (HCC)[place]  . CHF (congestive heart failure) (HCC)[place]. Arrhythmia     atrial fibrillation  . AAA (abdominal aortic aneurysm) (HCC)[place]. Esophageal stricture   . BPH (benign prostatic hyperplasia)   . DDD (degenerative disc disease), thoracic   .  Hyperlipidemia     Tobacco Use: History  Smoking status  . Former Smoker -- 0.50 packs/day for 15 years  . Types: Cigarettes  . Quit date: 04/22/1985  Smokeless tobacco  . Former Systems developer  . Types: Chew    Labs: Recent Review Flowsheet Data    There is no flowsheet data to display.       Exercise Target Goals:    Exercise Program Goal: Individual exercise prescription set with THRR, safety & activity barriers. Participant demonstrates ability to understand and report RPE using BORG scale, to self-measure pulse accurately, and to acknowledge the importance of the exercise prescription.  Exercise Prescription Goal: Starting with aerobic activity 30 plus minutes a day, 3 days per week for initial exercise prescription. Provide home exercise prescription and guidelines that participant acknowledges understanding prior to discharge.  Activity Barriers & Risk  Stratification:     Activity Barriers & Cardiac Risk Stratification - 05/26/15 1253    Activity Barriers & Cardiac Risk Stratification   Activity Barriers Arthritis;Back Problems;Shortness of Breath   Cardiac Risk Stratification High      6 Minute Walk:     6 Minute Walk      05/26/15 1209       6 Minute Walk   Phase Initial     Distance 1075 feet     Walk Time 6 minutes     RPE 11     Perceived Dyspnea  3     Symptoms Yes (comment)  "just my legs and hips feel weak"     Resting HR 78 bpm     Resting BP 110/70 mmHg     Max Ex. HR 115 bpm     Max Ex. BP 102/60 mmHg        Initial Exercise Prescription:     Initial Exercise Prescription - 05/26/15 1300    Date of Initial Exercise RX and Referring Provider   Date 05/26/15   Treadmill   MPH 1.5   Grade 0   Minutes 15   Bike   Level 0.5   Minutes 15   Recumbant Bike   Level 2   Watts 40   NuStep   Level 2   Watts 40   Minutes 15   Arm Ergometer   Level 1   Watts 10   Minutes 15   Arm/Foot Ergometer   Level 4   Watts 12   Minutes 15   Cybex   Level 2   RPM 50   Minutes 15   Recumbant Elliptical   Level 1   Watts 10   Minutes 15   REL-XR   Level 1   Watts 40   Minutes 15   Biostep-RELP   Level 1   Watts 15   Minutes 15   Prescription Details   Frequency (times per week) 3   Duration Progress to 50 minutes of aerobic without signs/symptoms of physical distress   Intensity   THRR REST +  30   Ratings of Perceived Exertion 11-15   Perceived Dyspnea 0-4   Progression   Progression Continue to progress workloads to maintain intensity without signs/symptoms of physical distress.   Resistance Training   Training Prescription Yes   Weight 2   Reps 10-15      Perform Capillary Blood Glucose checks as needed.  Exercise Prescription Changes:   Exercise Comments:     Exercise Comments      06/02/15 0616 06/02/15 1017  Exercise Comments Chart Review: Attended medical reiew  but has not yet started cardiac rehab classes.  First day of exercise! [name] was oriented to the gym and the equipment functions and settings. Procedures and policies of the gym were outlined and explained. The patient's individual exercise prescription and treatment plan were reviewed with him. All starting workloads were established based on the results of the functional testing  done at the initial intake visit. The plan for exercise progression was also introduced and progression will be customized based on the patient's performance and goals.          Discharge Exercise Prescription (Final Exercise Prescription Changes):   Nutrition:  Target Goals: Understanding of nutrition guidelines, daily intake of sodium <1534m, cholesterol <2098m calories 30% from fat and 7% or less from saturated fats, daily to have 5 or more servings of fruits and vegetables.  Biometrics:     Pre Biometrics - 05/26/15 1210    Pre Biometrics   Waist Circumference 41.5 inches   Hip Circumference 39.2 inches   Waist to Hip Ratio 1.06 %       Nutrition Therapy Plan and Nutrition Goals:   Nutrition Discharge: Rate Your Plate Scores:   Nutrition Goals Re-Evaluation:   Psychosocial: Target Goals: Acknowledge presence or absence of depression, maximize coping skills, provide positive support system. Participant is able to verbalize types and ability to use techniques and skills needed for reducing stress and depression.  Initial Review & Psychosocial Screening:     Initial Psych Review & Screening - 05/26/15 1436    Screening Interventions   Interventions --  Patient denies suicidal ideation.        Quality of Life Scores:   PHQ-9:     Recent Review Flowsheet Data    [facility] 05/26/2015 05/20/2015 04/23/2015   Decreased Interest 3 0 0   Down, Depressed, Hopeless 2 0 0   PHQ - 2 Score 5 0 0   Altered sleeping 3 - -   Tired, decreased energy 3 - -   Change in appetite 2 - -    Feeling bad or failure about yourself  2 - -   Trouble concentrating 0 - -   Moving slowly or fidgety/restless 2 - -   Suicidal thoughts 0 - -   PHQ-9 Score 17 - -   Difficult doing work/chores Somewhat difficult - -      Psychosocial Evaluation and Intervention:     Psychosocial Evaluation - 06/02/15 1202    Psychosocial Evaluation & Interventions   Interventions Encouraged to exercise with the program and follow exercise prescription   Comments Counselor met with Mr. FrKrossoday for initial psychosocial evaluation.  He was accompanied by his spouse of 2 years.  Mr. FrSagravesas a strong support system with his spouse, a brother who lives close by and active involvement in his local church.  He has diabetes as well as CHF.  Mr. FrSanchezcored intially a "17" on the PHQ-9 which  is moderately severe for depression.  He and his spouse reported there has been a change of medications in the past 2 weeks which have made a significant difference in his mood and energy levels as well as improved sleep.  Therapist reviewed and revised the PHQ-9 with a new score of "3"  which is a drastic improvement with this being attributed to the medication adjustment and addition of sleep meds.  He denies current symptoms of depression or anxiety and states his  mood is positive at this time.  He also reports other than his health issues, he has minimal stress in his life currently. Mr. Keil has goals for this program to increase his energy, stamina and to breathe better.  He loves to walk so will plan [facility] follow this program [facility]      Psychosocial Re-Evaluation:   Vocational Rehabilitation: Provide vocational rehab assistance to qualifying candidates.   Vocational Rehab Evaluation & Intervention:     Vocational Rehab - 05/26/15 1255    Initial Vocational Rehab Evaluation & Intervention   Assessment shows need for Vocational Rehabilitation No       Education: Education Goals: Education classes will be provided on a weekly basis, covering required topics. Participant will state understanding/return demonstration of topics presented.  Learning Barriers/Preferences:     Learning Barriers/Preferences - 05/26/15 1254    Learning Barriers/Preferences   Learning Barriers Hearing;Exercise Concerns   Learning Preferences None      Education Topics: General Nutrition Guidelines/Fats and Fiber: -Group instruction provided by verbal, written material, models and posters to present the general guidelines for heart healthy nutrition. Gives an explanation and review of dietary fats and fiber.   Controlling Sodium/Reading Food Labels: -Group verbal and written material supporting the discussion of sodium use in heart healthy nutrition. Review and explanation with models, verbal and written materials for utilization of the food label.   Exercise Physiology & Risk Factors: - Group verbal and written instruction with models to review the exercise physiology of the cardiovascular system and associated critical values. Details cardiovascular disease risk factors and the goals associated with each risk factor.          Cardiac Rehab from 06/02/2015 in [facility] Cardiac and Pulmonary Rehab   Date  06/02/15   Educator  BS   Instruction Review Code  2- meets goals/outcomes      Aerobic Exercise & Resistance Training: - Gives group verbal and written discussion on the health impact of inactivity. On the components of aerobic and resistive training programs and the benefits of this training and how to safely progress through these programs.   Flexibility, Balance, General Exercise Guidelines: - Provides group verbal and written instruction on the benefits of flexibility and balance training programs. Provides general exercise guidelines with specific guidelines to those with heart or lung disease. Demonstration and skill practice  provided.   Stress Management: - Provides group verbal and written instruction about the health risks of elevated stress, cause of high stress, and healthy ways to reduce stress.   Depression: - Provides group verbal and written instruction on the correlation between heart/lung disease and depressed mood, treatment options, and the stigmas associated with seeking treatment.   Anatomy & Physiology of the Heart: - Group verbal and written instruction and models provide basic cardiac anatomy and physiology, with the coronary electrical and arterial systems. Review of: AMI, Angina, Valve disease, Heart Failure, Cardiac Arrhythmia, Pacemakers, and the ICD.   Cardiac Procedures: - Group verbal and written instruction and models to describe the testing methods done to diagnose heart disease. Reviews the outcomes of the test results. Describes the treatment choices: Medical Management, Angioplasty, or Coronary Bypass Surgery.   Cardiac Medications: - Group verbal and written instruction to review commonly prescribed medications for heart disease. Reviews the medication, class of the drug, and side effects. Includes the steps to properly store meds and maintain the prescription regimen.   Go Sex-Intimacy & Heart  Disease, Get SMART - Goal Setting: - Group verbal and written instruction through game format to discuss heart disease and the return to sexual intimacy. Provides group verbal and written material to discuss and apply goal setting through the application of the S.M.A.R.T. Method.   Other Matters of the Heart: - Provides group verbal, written materials and models to describe Heart Failure, Angina, Valve Disease, and Diabetes in the realm of heart disease. Includes description of the disease process and treatment options available to the cardiac patient.   Exercise & Equipment Safety: - Individual verbal instruction and demonstration of equipment use and safety with use of the  equipment.      Cardiac Rehab from 06/02/2015 in [facility] Cardiac and Pulmonary Rehab   Date  05/26/15   Educator  D. Joya Gaskins, RN   Instruction Review Code  1- partially meets, needs review/practice      Infection Prevention: - Provides verbal and written material to individual with discussion of infection control including proper hand washing and proper equipment cleaning during exercise session.      Cardiac Rehab from 06/02/2015 in [facility] Cardiac and Pulmonary Rehab   Date  05/26/15   Educator  D. Joya Gaskins, RN   Instruction Review Code  2- meets goals/outcomes      Falls Prevention: - Provides verbal and written material to individual with discussion of falls prevention and safety.      Cardiac Rehab from 06/02/2015 in [facility] Cardiac and Pulmonary Rehab   Date  05/26/15   Educator  D. Joya Gaskins, RN   Instruction Review Code  2- meets goals/outcomes      Diabetes: - Individual verbal and written instruction to review signs/symptoms of diabetes, desired ranges of glucose level fasting, after meals and with exercise. Advice that pre and post exercise glucose checks will be done for 3 sessions at entry of program.      Cardiac Rehab from 06/02/2015 in [facility] Cardiac and Pulmonary Rehab   Date  05/26/15   Educator  D. Joya Gaskins, RN   Instruction Review Code  2- meets goals/outcomes       Knowledge Questionnaire Score:   Core Components/Risk Factors/Patient Goals at Admission:     Personal Goals and Risk Factors at Admission - 05/26/15 1259    Core Components/Risk Factors/Patient Goals on Admission    Weight Management Yes   Intervention Weight Management: Develop a combined nutrition and exercise program designed to reach desired caloric intake, while maintaining appropriate intake of nutrient and fiber, sodium and fats, and appropriate energy expenditure required for the weight goal.;Weight Management: Provide education and appropriate resources to help participant work on and attain dietary  goals.;Weight Management/Obesity: Establish reasonable short term and long term weight goals.   Admit Weight 183 lb 4.8 oz (83.144 kg)   Goal Weight: Short Term 183 lb 4.8 oz (83.144 kg)   Goal Weight: Long Term 183 lb 4.8 oz (83.144 kg)   Expected Outcomes Short Term: Continue to assess and modify interventions until short term weight is achieved;Long Term: Adherence to nutrition and physical activity/exercise program aimed toward attainment of established weight goal;Weight Maintenance: Understanding of the daily nutrition guidelines, which includes 25-35% calories from fat, 7% or less cal from saturated fats, less than 276m cholesterol, less than 1.5gm of sodium, & 5 or more servings of fruits and vegetables daily;Understanding recommendations for meals to include 15-35% energy as protein, 25-35% energy from fat, 35-60% energy from carbohydrates, less than 2022mof dietary cholesterol, 20-35 gm of total fiber  daily;Understanding of distribution of calorie intake throughout the day with the consumption of 4-5 meals/snacks   Sedentary Yes   Intervention Provide advice, education, support and counseling about physical activity/exercise needs.;Develop an individualized exercise prescription for aerobic and resistive training based on initial evaluation findings, risk stratification, comorbidities and participant's personal goals.   Expected Outcomes Achievement of increased cardiorespiratory fitness and enhanced flexibility, muscular endurance and strength shown through measurements of functional capacity and personal statement of participant.   Increase Strength and Stamina Yes   Intervention Develop an individualized exercise prescription for aerobic and resistive training based on initial evaluation findings, risk stratification, comorbidities and participant's personal goals.;Provide advice, education, support and counseling about physical activity/exercise needs.   Expected Outcomes Achievement of  increased cardiorespiratory fitness and enhanced flexibility, muscular endurance and strength shown through measurements of functional capacity and personal statement of participant.   Improve shortness of breath with ADL's Yes   Intervention Provide education, individualized exercise plan and daily activity instruction to help decrease symptoms of SOB with activities of daily living.   Expected Outcomes Short Term: Achieves a reduction of symptoms when performing activities of daily living.   Diabetes Yes   Intervention Provide education about signs/symptoms and action to take for hypo/hyperglycemia.;Provide education about proper nutrition, including hydration, and aerobic/resistive exercise prescription along with prescribed medications to achieve blood glucose in normal ranges: Fasting glucose 65-99 mg/dL   Expected Outcomes Short Term: Participant verbalizes understanding of the signs/symptoms and immediate care of hyper/hypoglycemia, proper foot care and importance of medication, aerobic/resistive exercise and nutrition plan for blood glucose control.;Long Term: Attainment of HbA1C < 7%.   Heart Failure Yes   Intervention Provide a combined exercise and nutrition program that is supplemented with education, support and counseling about heart failure. Directed toward relieving symptoms such as shortness of breath, decreased exercise tolerance, and extremity edema.   Expected Outcomes Improve functional capacity of life;Short term: Attendance in program 2-3 days a week with increased exercise capacity. Reported lower sodium intake. Reported increased fruit and vegetable intake. Reports medication compliance.;Short term: Daily weights obtained and reported for increase. Utilizing diuretic protocols set by physician.;Long term: Adoption of self-care skills and reduction of barriers for early signs and symptoms recognition and intervention leading to self-care maintenance.   Hypertension Yes    Intervention Provide education on lifestyle modifcations including regular physical activity/exercise, weight management, moderate sodium restriction and increased consumption of fresh fruit, vegetables, and low fat dairy, alcohol moderation, and smoking cessation.;Monitor prescription use compliance.   Expected Outcomes Short Term: Continued assessment and intervention until BP is < 140/60m HG in hypertensive participants. < 130/844mHG in hypertensive participants with diabetes, heart failure or chronic kidney disease.;Long Term: Maintenance of blood pressure at goal levels.   Lipids Yes   Intervention Provide education and support for participant on nutrition & aerobic/resistive exercise along with prescribed medications to achieve LDL <7042mHDL >35m55m Expected Outcomes Short Term: Participant states understanding of desired cholesterol values and is compliant with medications prescribed. Participant is following exercise prescription and nutrition guidelines.;Long Term: Cholesterol controlled with medications as prescribed, with individualized exercise RX and with personalized nutrition plan. Value goals: LDL < 70mg8mL > 40 mg.      Core Components/Risk Factors/Patient Goals Review:    Core Components/Risk Factors/Patient Goals at Discharge (Final Review):    ITP Comments:     ITP Comments      06/06/15 0943  ITP Comments 30 day review.  Continue with ITP  New to program   3 visits          Comments:

## 2015-06-07 ENCOUNTER — Encounter: Payer: Commercial Managed Care - HMO | Admitting: *Deleted

## 2015-06-07 DIAGNOSIS — I5022 Chronic systolic (congestive) heart failure: Secondary | ICD-10-CM

## 2015-06-07 LAB — GLUCOSE, CAPILLARY: Glucose-Capillary: 104 mg/dL — ABNORMAL HIGH (ref 65–99)

## 2015-06-07 NOTE — Progress Notes (Signed)
Daily Session Note  Patient Details  Name: Derrick Cochran MRN: [id number] Date of Birth: 07-24-1935 Referring Provider:    Encounter Date: 06/07/2015  Check In:     Session Check In - 06/07/15 0844    Check-In   Location [facility]-Cardiac & Pulmonary Rehab   Staff Present Heath Lark, RN, BSN, Laveda Norman, BS, ACSM CEP, Exercise Physiologist;Rebecca Brayton El, DPT, CEEA   Supervising physician immediately available to respond to emergencies See telemetry face sheet for immediately available ER MD   Medication changes reported     No   Fall or balance concerns reported    No   Warm-up and Cool-down Performed on first and last piece of equipment   Resistance Training Performed Yes   VAD Patient? No   Pain Assessment   Currently in Pain? No/denies   Multiple Pain Sites No         Goals Met:  Independence with exercise equipment Exercise tolerated well No report of cardiac concerns or symptoms Strength training completed today  Goals Unmet:  Not Applicable  Comments: Patient completed exercise prescription and all exercise goals during rehab session. The exercise was tolerated well and the patient is progressing in the program.     Dr. Emily Filbert is Medical Director for [facility].

## 2015-06-09 ENCOUNTER — Encounter: Payer: Commercial Managed Care - HMO | Admitting: *Deleted

## 2015-06-09 DIAGNOSIS — I5022 Chronic systolic (congestive) heart failure: Secondary | ICD-10-CM

## 2015-06-09 NOTE — Progress Notes (Signed)
Daily Session Note  Patient Details  Name: Derrick Cochran MRN: [id number] Date of Birth: 21-Apr-1935 Referring Provider:    Encounter Date: 06/09/2015  Check In:     Session Check In - 06/09/15 0836    Check-In   Staff Present Nyoka Cowden, RN;Taisia Fantini, RN, BSN, CCRP;Rebecca Sickles, DPT, [place] physician immediately available to respond to emergencies See telemetry face sheet for immediately available ER MD   Medication changes reported     No   Fall or balance concerns reported    No   Warm-up and Cool-down Performed on first and last piece of equipment   VAD Patient? No   Pain Assessment   Currently in Pain? No/denies         Goals Met:  Independence with exercise equipment Exercise tolerated well No report of cardiac concerns or symptoms Strength training completed today  Goals Unmet:  Not Applicable  Comments: Doing well with exercise prescription progression.    Dr. Emily Filbert is Medical Director for [facility] and [facility].

## 2015-06-11 ENCOUNTER — Encounter: Payer: Commercial Managed Care - HMO | Admitting: *Deleted

## 2015-06-11 DIAGNOSIS — I5022 Chronic systolic (congestive) heart failure: Secondary | ICD-10-CM | POA: Diagnosis not present

## 2015-06-11 NOTE — Progress Notes (Signed)
Daily Session Note  Patient Details  Name: MAXXWELL EDGETT MRN: [id number] Date of Birth: [birth year] Referring Provider:    Encounter Date: 06/11/2015  Check In:     Session Check In - 06/11/15 0910    Check-In   Staff Present Nyoka Cowden, RN;Susanne Bice, RN, BSN, CCRP;Rebecca Sickles, DPT, [place] physician immediately available to respond to emergencies See telemetry face sheet for immediately available ER MD   Medication changes reported     No   Fall or balance concerns reported    No   Warm-up and Cool-down Performed on first and last piece of equipment   VAD Patient? No   Pain Assessment   Currently in Pain? No/denies         Goals Met:  Exercise tolerated well No report of cardiac concerns or symptoms Strength training completed today  Goals Unmet:  Not Applicable  Comments: Doing well with exercise prescription progression.    Dr. Emily Filbert is Medical Director for [facility] and [facility].

## 2015-06-14 ENCOUNTER — Encounter: Payer: Commercial Managed Care - HMO | Attending: Internal Medicine | Admitting: *Deleted

## 2015-06-14 DIAGNOSIS — I5022 Chronic systolic (congestive) heart failure: Secondary | ICD-10-CM | POA: Diagnosis not present

## 2015-06-14 NOTE — Addendum Note (Signed)
Addended by: Gerlene Burdock on: 06/14/2015 04:19 PM   Modules accepted: Medications

## 2015-06-14 NOTE — Progress Notes (Signed)
Daily Session Note  Patient Details  Name: Derrick Cochran MRN: [id number] Date of Birth: 01/03/1936 Referring Provider:    Encounter Date: 06/14/2015  Check In:     Session Check In - 06/14/15 1023    Check-In   Staff Present Heath Lark, RN, BSN, CCRP;Carroll Enterkin, RN, Moises Blood, BS, ACSM CEP, Exercise Physiologist   Supervising physician immediately available to respond to emergencies See telemetry face sheet for immediately available ER MD   Medication changes reported     No   Fall or balance concerns reported    No   Warm-up and Cool-down Performed on first and last piece of equipment   VAD Patient? No   Pain Assessment   Currently in Pain? No/denies         Goals Met:  Exercise tolerated well No report of cardiac concerns or symptoms  Goals Unmet:  Not Applicable  Comments: Doing well with exercise prescription progression.    Dr. Emily Filbert is Medical Director for [facility].

## 2015-06-16 ENCOUNTER — Encounter: Payer: Self-pay | Admitting: *Deleted

## 2015-06-16 ENCOUNTER — Telehealth: Payer: Self-pay | Admitting: *Deleted

## 2015-06-16 DIAGNOSIS — I5022 Chronic systolic (congestive) heart failure: Secondary | ICD-10-CM

## 2015-06-16 NOTE — Telephone Encounter (Signed)
Derrick Cochran left a vm that Derrick Cochran was sorry that he was sick and would be unable to attend Cardiac Rehab today.

## 2015-06-21 ENCOUNTER — Encounter: Payer: Commercial Managed Care - HMO | Admitting: *Deleted

## 2015-06-21 DIAGNOSIS — I5022 Chronic systolic (congestive) heart failure: Secondary | ICD-10-CM

## 2015-06-21 NOTE — Progress Notes (Signed)
Daily Session Note  Patient Details  Name: Derrick Cochran MRN: [id number] Date of Birth: January 09, 1936 Referring Provider:    Encounter Date: 06/21/2015  Check In:     Session Check In - 06/21/15 0847    Check-In   Location [facility]   Staff Present Heath Lark, RN, BSN, CCRP;Carroll Enterkin, RN, Moises Blood, BS, ACSM CEP, Exercise Physiologist;Other   Supervising physician immediately available to respond to emergencies See telemetry face sheet for immediately available ER MD   Medication changes reported     No   Fall or balance concerns reported    No   Warm-up and Cool-down Performed on first and last piece of equipment   Resistance Training Performed Yes   VAD Patient? No   Pain Assessment   Currently in Pain? No/denies   Multiple Pain Sites No         Goals Met:  Independence with exercise equipment Exercise tolerated well No report of cardiac concerns or symptoms Strength training completed today  Goals Unmet:  Not Applicable  Comments: Patient completed exercise prescription and all exercise goals during rehab session. The exercise was tolerated well and the patient is progressing in the program.     Dr. Emily Filbert is Medical Director for [facility].
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
## 2015-06-23 ENCOUNTER — Encounter: Payer: Commercial Managed Care - HMO | Admitting: *Deleted

## 2015-06-23 DIAGNOSIS — I5022 Chronic systolic (congestive) heart failure: Secondary | ICD-10-CM | POA: Diagnosis not present

## 2015-06-23 NOTE — Progress Notes (Signed)
Daily Session Note  Patient Details  Name: Derrick Cochran MRN: [id number] Date of Birth: [birth year] Referring Provider:    Encounter Date: 06/23/2015  Check In:     Session Check In - 06/23/15 0906    Check-In   Staff Present Heath Lark, RN, BSN, CCRP;Laureen Owens Shark, BS, RRT, Respiratory Therapist;Carroll Enterkin, RN, BSN   Supervising physician immediately available to respond to emergencies See telemetry face sheet for immediately available ER MD   Medication changes reported     No   Fall or balance concerns reported    No   Warm-up and Cool-down Performed on first and last piece of equipment   VAD Patient? No   Pain Assessment   Currently in Pain? No/denies         Goals Met:  Exercise tolerated well Personal goals reviewed No report of cardiac concerns or symptoms Strength training completed today  Goals Unmet:  Not Applicable  Comments: Doing well with exercise prescription progression.    Dr. Emily Filbert is Medical Director for [facility] and [facility].

## 2015-06-28 ENCOUNTER — Encounter: Payer: Commercial Managed Care - HMO | Admitting: *Deleted

## 2015-06-28 DIAGNOSIS — I5022 Chronic systolic (congestive) heart failure: Secondary | ICD-10-CM | POA: Diagnosis not present

## 2015-06-28 NOTE — Progress Notes (Signed)
Daily Session Note  Patient Details  Name: Derrick Cochran MRN: [id number] Date of Birth: 20-Aug-1935 Referring Provider:    Encounter Date: 06/28/2015  Check In:     Session Check In - 06/28/15 0833    Check-In   Location [facility]   Staff Present Earlean Shawl, BS, ACSM CEP, Exercise Physiologist;Susanne Bice, RN, BSN, CCRP;Carroll Enterkin, RN, BSN;Other   Supervising physician immediately available to respond to emergencies See telemetry face sheet for immediately available ER MD   Medication changes reported     No   Fall or balance concerns reported    No   Warm-up and Cool-down Performed on first and last piece of equipment   Resistance Training Performed Yes   VAD Patient? No   Pain Assessment   Currently in Pain? No/denies   Multiple Pain Sites No         Goals Met:  Independence with exercise equipment Exercise tolerated well No report of cardiac concerns or symptoms Strength training completed today  Goals Unmet:  Not Applicable  Comments: Patient completed exercise prescription and all exercise goals during rehab session. The exercise was tolerated well and the patient is progressing in the program.     Dr. Emily Filbert is Medical Director for [facility] and [facility].

## 2015-06-30 ENCOUNTER — Encounter: Payer: Commercial Managed Care - HMO | Admitting: *Deleted

## 2015-06-30 DIAGNOSIS — I480 Paroxysmal atrial fibrillation: Secondary | ICD-10-CM | POA: Diagnosis not present

## 2015-06-30 DIAGNOSIS — I5022 Chronic systolic (congestive) heart failure: Secondary | ICD-10-CM | POA: Diagnosis not present

## 2015-06-30 NOTE — Progress Notes (Signed)
Daily Session Note  Patient Details  Name: Derrick Cochran MRN: [id number] Date of Birth: 04/06/1935 Referring Provider:    Encounter Date: 06/30/2015  Check In:     Session Check In - 06/30/15 0830    Check-In   Location [facility]   Staff Present Susanne Bice, RN, BSN, CCRP;Carroll Enterkin, RN, BSN;Jessica Hawkins, MA, ACSM RCEP, Exercise Physiologist;Amanda Sommer, BA, ACSM CEP, Exercise Physiologist; , RN, BSN   Supervising physician immediately available to respond to emergencies See telemetry face sheet for immediately available ER MD   Medication changes reported     Yes   Comments Started on Doxycycline 100 mg two times a day on Jun 28, 2015.     Fall or balance concerns reported    No   Warm-up and Cool-down Performed on first and last piece of equipment   Resistance Training Performed Yes   VAD Patient? No   Pain Assessment   Currently in Pain? No/denies         Goals Met:  Independence with exercise equipment Exercise tolerated well No report of cardiac concerns or symptoms Strength training completed today  Goals Unmet:  Not Applicable  Comments:  Patient completed exercise prescription and all exercise goals during rehab session. The exercise was tolerated well and the patient is progressing in the program.    Dr. Mark Miller is Medical Director for [facility] and [facility]. 

## 2015-07-02 DIAGNOSIS — I5022 Chronic systolic (congestive) heart failure: Secondary | ICD-10-CM | POA: Diagnosis not present

## 2015-07-02 NOTE — Progress Notes (Signed)
Daily Session Note  Patient Details  Name: LANKFORD GUTZMER MRN: [id number] Date of Birth: September 02, 1935 Referring Provider:    Encounter Date: 07/02/2015  Check In:     Session Check In - 07/02/15 0915    Check-In   Location [facility]   Staff Present Heath Lark, RN, BSN, CCRP;Carroll Enterkin, RN, BSN;Jessica Luan Pulling, MA, ACSM RCEP, Exercise Physiologist;Quartez Lagos Oletta Darter, BA, ACSM CEP, Exercise Physiologist   Supervising physician immediately available to respond to emergencies See telemetry face sheet for immediately available ER MD   Medication changes reported     No   Fall or balance concerns reported    No   Warm-up and Cool-down Performed on first and last piece of equipment   Resistance Training Performed Yes   VAD Patient? No   Pain Assessment   Currently in Pain? No/denies         Goals Met:  Independence with exercise equipment Exercise tolerated well No report of cardiac concerns or symptoms Strength training completed today  Goals Unmet:  Not Applicable  Comments: Rollin is progressing well with exercise   Dr. Emily Filbert is Medical Director for [facility].

## 2015-07-04 ENCOUNTER — Encounter: Payer: Self-pay | Admitting: *Deleted

## 2015-07-04 DIAGNOSIS — I5022 Chronic systolic (congestive) heart failure: Secondary | ICD-10-CM

## 2015-07-04 NOTE — Progress Notes (Signed)
Cardiac Individual Treatment Plan  Patient Details  Name: Derrick Cochran MRN: [id number] Date of Birth: 1935/11/30 Referring Provider:    Initial Encounter Date:       Cardiac Rehab from 05/26/2015 in [facility] Cardiac and Pulmonary Rehab   Date  05/26/15      Visit Diagnosis: Chronic systolic congestive heart failure ([place])  Patient's Home Medications on Admission:  Current outpatient prescriptions:  .  acetaminophen (TYLENOL) 500 MG tablet, Take 500 mg by mouth every 6 (six) hours as needed for mild pain, moderate pain or fever. , Disp: , Rfl:  .  aspirin EC 81 MG EC tablet, Take 1 tablet (81 mg total) by mouth daily., Disp: , Rfl:  .  cyanocobalamin 1000 MCG tablet, Take 1,000 mcg by mouth daily., Disp: , Rfl:  .  doxycycline (VIBRA-TABS) 100 MG tablet, Take 100 mg by mouth 2 (two) times daily., Disp: , Rfl:  .  enalapril (VASOTEC) 5 MG tablet, Take 5 mg by mouth 2 (two) times daily. , Disp: , Rfl:  .  furosemide (LASIX) 20 MG tablet, Take 20 mg by mouth every morning. , Disp: , Rfl:  .  gabapentin (NEURONTIN) 300 MG capsule, Take 300 mg by mouth daily. , Disp: , Rfl:  .  glipiZIDE (GLUCOTROL XL) 5 MG 24 hr tablet, Take 5 mg by mouth every morning., Disp: , Rfl:  .  levothyroxine (SYNTHROID, LEVOTHROID) 137 MCG tablet, Take 137 mcg by mouth daily before breakfast. , Disp: , Rfl:  .  loratadine (CLARITIN) 10 MG tablet, Take 1 tablet by mouth daily., Disp: , Rfl:  .  LORazepam (ATIVAN) 1 MG tablet, Take 2 mg by mouth at bedtime., Disp: , Rfl:  .  metFORMIN (GLUCOPHAGE) 500 MG tablet, Take 500 mg by mouth 2 (two) times daily. , Disp: , Rfl:  .  metoprolol succinate (TOPROL-XL) 25 MG 24 hr tablet, Take 25 mg by mouth daily., Disp: , Rfl:  .  nitroGLYCERIN (NITROSTAT) 0.4 MG SL tablet, Place 0.4 mg under the tongue every 5 (five) minutes as needed for chest pain., Disp: , Rfl:  .  omeprazole (PRILOSEC) 20 MG capsule, Take 20 mg by mouth every morning. , Disp: , Rfl:  .  simvastatin (ZOCOR) 40  MG tablet, Take 40 mg by mouth every evening. , Disp: , Rfl:  .  sotalol (BETAPACE) 80 MG tablet, Take 1 tablet (80 mg total) by mouth 2 (two) times daily., Disp: 60 tablet, Rfl: 5 .  tamsulosin (FLOMAX) 0.4 MG CAPS capsule, Take 0.4 mg by mouth every morning. , Disp: , Rfl:  .  temazepam (RESTORIL) 15 MG capsule, Take 15 mg by mouth at bedtime. , Disp: , Rfl:  .  warfarin (COUMADIN) 1 MG tablet, Take 1 mg by mouth every evening. Pt takes with '3mg'$  tablet for a total dose of '4mg'$ ., Disp: , Rfl:  .  warfarin (COUMADIN) 3 MG tablet, Take 3 mg by mouth every evening. Pt takes with '1mg'$  tablet for a total dose of '4mg'$ ., Disp: , Rfl:   Past Medical History: Past Medical History  Diagnosis Date  . Coronary artery disease   . Hypertension   . Myocardial infarction ([place])   . Hypothyroidism   . Diabetes mellitus without complication ([place])   . GERD (gastroesophageal reflux disease)   . Arthritis   . Heart murmur   . Shortness of breath dyspnea   . Cancer ([place])     skin  . CHF (congestive heart failure) ([place])   .  Arrhythmia     atrial fibrillation  . AAA (abdominal aortic aneurysm) ([place])   . Esophageal stricture   . BPH (benign prostatic hyperplasia)   . DDD (degenerative disc disease), thoracic   . Hyperlipidemia     Tobacco Use: History  Smoking status  . Former Smoker -- 0.50 packs/day for 15 years  . Types: Cigarettes  . Quit date: 04/22/1985  Smokeless tobacco  . Former Systems developer  . Types: Chew    Labs: Recent Review Flowsheet Data    There is no flowsheet data to display.       Exercise Target Goals:    Exercise Program Goal: Individual exercise prescription set with THRR, safety & activity barriers. Participant demonstrates ability to understand and report RPE using BORG scale, to self-measure pulse accurately, and to acknowledge the importance of the exercise prescription.  Exercise Prescription Goal: Starting with aerobic activity 30 plus minutes a day, 3 days per week  for initial exercise prescription. Provide home exercise prescription and guidelines that participant acknowledges understanding prior to discharge.  Activity Barriers & Risk Stratification:     Activity Barriers & Cardiac Risk Stratification - 05/26/15 1253    Activity Barriers & Cardiac Risk Stratification   Activity Barriers Arthritis;Back Problems;Shortness of Breath   Cardiac Risk Stratification High      6 Minute Walk:     6 Minute Walk      05/26/15 1209       6 Minute Walk   Phase Initial     Distance 1075 feet     Walk Time 6 minutes     RPE 11     Perceived Dyspnea  3     Symptoms Yes (comment)  "just my legs and hips feel weak"     Resting HR 78 bpm     Resting BP 110/70 mmHg     Max Ex. HR 115 bpm     Max Ex. BP 102/60 mmHg        Initial Exercise Prescription:     Initial Exercise Prescription - 05/26/15 1300    Date of Initial Exercise RX and Referring Provider   Date 05/26/15   Treadmill   MPH 1.5   Grade 0   Minutes 15   Bike   Level 0.5   Minutes 15   Recumbant Bike   Level 2   Watts 40   NuStep   Level 2   Watts 40   Minutes 15   Arm Ergometer   Level 1   Watts 10   Minutes 15   Arm/Foot Ergometer   Level 4   Watts 12   Minutes 15   Cybex   Level 2   RPM 50   Minutes 15   Recumbant Elliptical   Level 1   Watts 10   Minutes 15   REL-XR   Level 1   Watts 40   Minutes 15   Biostep-RELP   Level 1   Watts 15   Minutes 15   Prescription Details   Frequency (times per week) 3   Duration Progress to 50 minutes of aerobic without signs/symptoms of physical distress   Intensity   THRR REST +  30   Ratings of Perceived Exertion 11-15   Perceived Dyspnea 0-4   Progression   Progression Continue to progress workloads to maintain intensity without signs/symptoms of physical distress.   Resistance Training   Training Prescription Yes   Weight 2   Reps 10-15  Perform Capillary Blood Glucose checks as  needed.  Exercise Prescription Changes:     Exercise Prescription Changes      06/28/15 0900           Exercise Review   Progression Yes       Response to Exercise   Blood Pressure (Admit) 110/64 mmHg       Blood Pressure (Exercise) 126/72 mmHg       Blood Pressure (Exit) 112/82 mmHg       Heart Rate (Admit) 97 bpm       Heart Rate (Exercise) 100 bpm       Heart Rate (Exit) 82 bpm       Rating of Perceived Exertion (Exercise) 13       Symptoms none       Duration Progress to 45 minutes of aerobic exercise without signs/symptoms of physical distress       Intensity Rest + 30       Progression   Progression Continue progressive overload as per policy without signs/symptoms or physical distress.       Resistance Training   Training Prescription Yes       Weight 2       Reps 10-15       Interval Training   Interval Training Yes       Equipment NuStep       Comments intervals 2 min by 30 sec. L5/ 60-80 watts, 12-15 RPE       Treadmill   MPH 1.5       Grade 0       Minutes 15       NuStep   Level 5       Watts 80  intervals 60-80 watts       Minutes 15       REL-XR   Level 5       Watts 40       Minutes 15       Biostep-RELP   Level 3       Watts 25       Minutes 15          Exercise Comments:     Exercise Comments      06/02/15 0616 06/02/15 1017 06/28/15 0945       Exercise Comments Chart Review: Attended medical reiew but has not yet started cardiac rehab classes.  First day of exercise! Joson was oriented to the gym and the equipment functions and settings. Procedures and policies of the gym were outlined and explained. The patient's individual exercise prescription and treatment plan were reviewed with him. All starting workloads were established based on the results of the functional testing  done at the initial intake visit. The plan for exercise progression was also introduced and progression will be customized based on the patient's performance and  goals.  Talked with Avari about his fitness goals. He would like to be able to walk and mow the yard again. He stated that his back pain is what mostly limits him (he stated that he is planning to go backto his chiropractor which has helped him in the past) as well as leg strengh. He has noticed a slight increase in strengh and energy since starting his exercise program. Interval training was discussed with the patient with regards to benefits and recommendations. See exercise prescription for details. Expected outcome:  back pain can be gotten under control and leg strengh can be increased from regular exercise. Follow  up with walking and mowing goal.         Discharge Exercise Prescription (Final Exercise Prescription Changes):     Exercise Prescription Changes - 06/28/15 0900    Exercise Review   Progression Yes   Response to Exercise   Blood Pressure (Admit) 110/64 mmHg   Blood Pressure (Exercise) 126/72 mmHg   Blood Pressure (Exit) 112/82 mmHg   Heart Rate (Admit) 97 bpm   Heart Rate (Exercise) 100 bpm   Heart Rate (Exit) 82 bpm   Rating of Perceived Exertion (Exercise) 13   Symptoms none   Duration Progress to 45 minutes of aerobic exercise without signs/symptoms of physical distress   Intensity Rest + 30   Progression   Progression Continue progressive overload as per policy without signs/symptoms or physical distress.   Resistance Training   Training Prescription Yes   Weight 2   Reps 10-15   Interval Training   Interval Training Yes   Equipment NuStep   Comments intervals 2 min by 30 sec. L5/ 60-80 watts, 12-15 RPE   Treadmill   MPH 1.5   Grade 0   Minutes 15   NuStep   Level 5   Watts 80  intervals 60-80 watts   Minutes 15   REL-XR   Level 5   Watts 40   Minutes 15   Biostep-RELP   Level 3   Watts 25   Minutes 15      Nutrition:  Target Goals: Understanding of nutrition guidelines, daily intake of sodium '1500mg'$ , cholesterol '200mg'$ , calories 30% from fat  and 7% or less from saturated fats, daily to have 5 or more servings of fruits and vegetables.  Biometrics:     Pre Biometrics - 05/26/15 1210    Pre Biometrics   Waist Circumference 41.5 inches   Hip Circumference 39.2 inches   Waist to Hip Ratio 1.06 %       Nutrition Therapy Plan and Nutrition Goals:     Nutrition Therapy & Goals - 06/22/15 1221    Nutrition Therapy   Diet Instructed patient and his wife on meal plan based on heart healthy principles and diabetes diabetes guidelines. They have already made many positive diet changes.   Drug/Food Interactions Statins/Certain Fruits   Protein (specify units) 8   Fiber 30 grams   Whole Grain Foods 3 servings   Saturated Fats 13 max. grams   Fruits and Vegetables 5 servings/day   Sodium 2000 grams   Personal Nutrition Goals   Personal Goal #1 Use calorieking.com to look up saturated fat, trans fat and sodium especially when dining out   Personal Goal #2 Read labels for saturated and trans fat and sodium.   Personal Goal #3 Include at least 5 servings of fruits/vegetables daily.   Personal Goal #4 Balance meals with protein, 3-4 servings of carbohydrate and non-starchy vegetables.   Intervention Plan   Intervention Prescribe, educate and counsel regarding individualized specific dietary modifications aiming towards targeted core components such as weight, hypertension, lipid management, diabetes, heart failure and other comorbidities.;Nutrition handout(s) given to patient.   Expected Outcomes Short Term Goal: Understand basic principles of dietary content, such as calories, fat, sodium, cholesterol and nutrients.;Short Term Goal: A plan has been developed with personal nutrition goals set during dietitian appointment.;Long Term Goal: Adherence to prescribed nutrition plan.      Nutrition Discharge: Rate Your Plate Scores:     Nutrition Assessments - 06/22/15 1230    Rate Your Plate Scores  Pre Score 71   Pre Score % 79 %       Nutrition Goals Re-Evaluation:   Psychosocial: Target Goals: Acknowledge presence or absence of depression, maximize coping skills, provide positive support system. Participant is able to verbalize types and ability to use techniques and skills needed for reducing stress and depression.  Initial Review & Psychosocial Screening:     Initial Psych Review & Screening - 05/26/15 1436    Screening Interventions   Interventions --  Patient denies suicidal ideation.        Quality of Life Scores:     Quality of Life - 06/08/15 1549    Quality of Life Scores   Health/Function Pre 19.2 %   Socioeconomic Pre 30 %   Psych/Spiritual Pre 27.43 %   Family Pre 30 %   GLOBAL Pre 24.71 %      PHQ-9:     Recent Review Flowsheet Data    Depression screen [facility] 2/9 05/26/2015 05/20/2015 04/23/2015   Decreased Interest 3 0 0   Down, Depressed, Hopeless 2 0 0   PHQ - 2 Score 5 0 0   Altered sleeping 3 - -   Tired, decreased energy 3 - -   Change in appetite 2 - -   Feeling bad or failure about yourself  2 - -   Trouble concentrating 0 - -   Moving slowly or fidgety/restless 2 - -   Suicidal thoughts 0 - -   PHQ-9 Score 17 - -   Difficult doing work/chores Somewhat difficult - -      Psychosocial Evaluation and Intervention:     Psychosocial Evaluation - 06/02/15 1202    Psychosocial Evaluation & Interventions   Interventions Encouraged to exercise with the program and follow exercise prescription   Comments Counselor met with Mr. Hyland today for initial psychosocial evaluation.  He was accompanied by his spouse of 2 years.  Mr. Albus has a strong support system with his spouse, a brother who lives close by and active involvement in his local church.  He has diabetes as well as CHF.  Mr. Werth scored intially a "17" on the PHQ-9 which  is moderately severe for depression.  He and his spouse reported there has been a change of medications in the past 2 weeks which have made a  significant difference in his mood and energy levels as well as improved sleep.  Therapist reviewed and revised the PHQ-9 with a new score of "3"  which is a drastic improvement with this being attributed to the medication adjustment and addition of sleep meds.  He denies current symptoms of depression or anxiety and states his mood is positive at this time.  He also reports other than his health issues, he has minimal stress in his life currently. Mr. Gasparro has goals for this program to increase his energy, stamina and to breathe better.  He loves to walk so will plan to follow this program [facility]      Psychosocial Re-Evaluation:     Psychosocial Re-Evaluation      06/09/15 1019 06/16/15 1037 06/21/15 0950 06/30/15 1001     Psychosocial Re-Evaluation   Interventions  Encouraged to attend Cardiac Rehabilitation for the exercise      Comments Counselor follow up with Mr. Som today reorting he is already feeling stronger since beginning this program.  He also is able to walk better and reorts his mood continues to  be positive.  He also mentioned he may have forgotten one of his medications yesterday and did not feel as well all day.  Counselor encouraged the daily pill boxes to remember and be better organized concerning taking his medications daily.   Mrs. Tolson left a vm that Jase was sorry that he was sick and would be unable to attend Cardiac Rehab today.  Ichael said CR has helped his leg strength so he is glad for that. His wife is very supportive.  Counselor follow up with Mr. Kimura exclaiming "I did 30 minutes on this machine today!"  Counselor commended Mr. Digioia on his hard work and commitment to exercise.  He also reports noting his core strength is better as he is able to get up and down with greater ease.  His mood continues to be positive and he states he is taking his medications daily and more consistently than previously.          Vocational Rehabilitation: Provide vocational rehab assistance to qualifying candidates.   Vocational Rehab Evaluation & Intervention:     Vocational Rehab - 05/26/15 1255    Initial Vocational Rehab Evaluation & Intervention   Assessment shows need for Vocational Rehabilitation No      Education: Education Goals: Education classes will be provided on a weekly basis, covering required topics. Participant will state understanding/return demonstration of topics presented.  Learning Barriers/Preferences:     Learning Barriers/Preferences - 05/26/15 1254    Learning Barriers/Preferences   Learning Barriers Hearing;Exercise Concerns   Learning Preferences None      Education Topics: General Nutrition Guidelines/Fats and Fiber: -Group instruction provided by verbal, written material, models and posters to present the general guidelines for heart healthy nutrition. Gives an explanation and review of dietary fats and fiber.   Controlling Sodium/Reading Food Labels: -Group verbal and written material supporting the discussion of sodium use in heart healthy nutrition. Review and explanation with models, verbal and written materials for utilization of the food label.   Exercise Physiology & Risk Factors: - Group verbal and written instruction with models to review the exercise physiology of the cardiovascular system and associated critical values. Details cardiovascular disease risk factors and the goals associated with each risk factor.          Cardiac Rehab from 06/30/2015 in [facility] Cardiac and Pulmonary Rehab   Date  06/02/15   Educator  BS   Instruction Review Code  2- meets goals/outcomes      Aerobic Exercise & Resistance Training: - Gives group verbal and written discussion on the health impact of inactivity. On the components of aerobic and resistive training programs and the benefits of this training and how to safely progress through these programs.      Cardiac  Rehab from 06/30/2015 in [facility] Cardiac and Pulmonary Rehab   Date  06/07/15   Educator  RS   Instruction Review Code  2- meets goals/outcomes      Flexibility, Balance, General Exercise Guidelines: - Provides group verbal and written instruction on the benefits of flexibility and balance training programs. Provides general exercise guidelines with specific guidelines to those with heart or lung disease. Demonstration and skill practice provided.      Cardiac Rehab from 06/30/2015 in [facility] Cardiac and Pulmonary Rehab   Date  06/09/15   Educator  bs   Instruction Review Code  2- meets goals/outcomes      Stress Management: - Provides group verbal and written instruction about the health risks of  elevated stress, cause of high stress, and healthy ways to reduce stress.   Depression: - Provides group verbal and written instruction on the correlation between heart/lung disease and depressed mood, treatment options, and the stigmas associated with seeking treatment.   Anatomy & Physiology of the Heart: - Group verbal and written instruction and models provide basic cardiac anatomy and physiology, with the coronary electrical and arterial systems. Review of: AMI, Angina, Valve disease, Heart Failure, Cardiac Arrhythmia, Pacemakers, and the ICD.      Cardiac Rehab from 06/30/2015 in [facility] Cardiac and Pulmonary Rehab   Date  06/14/15   Educator  SB   Instruction Review Code  2- meets goals/outcomes      Cardiac Procedures: - Group verbal and written instruction and models to describe the testing methods done to diagnose heart disease. Reviews the outcomes of the test results. Describes the treatment choices: Medical Management, Angioplasty, or Coronary Bypass Surgery.      Cardiac Rehab from 06/30/2015 in [facility] Cardiac and Pulmonary Rehab   Date  06/21/15   Educator  CE   Instruction Review Code  2- meets goals/outcomes      Cardiac Medications: - Group verbal and written instruction to  review commonly prescribed medications for heart disease. Reviews the medication, class of the drug, and side effects. Includes the steps to properly store meds and maintain the prescription regimen.      Cardiac Rehab from 06/30/2015 in [facility] Cardiac and Pulmonary Rehab   Date  06/30/15   Educator  SB   Instruction Review Code  2- meets goals/outcomes [Part II:  Cardiac Medications]      Go Sex-Intimacy & Heart Disease, Get SMART - Goal Setting: - Group verbal and written instruction through game format to discuss heart disease and the return to sexual intimacy. Provides group verbal and written material to discuss and apply goal setting through the application of the S.M.A.R.T. Method.      Cardiac Rehab from 06/30/2015 in [facility] Cardiac and Pulmonary Rehab   Date  06/21/15   Educator  CE   Instruction Review Code  2- meets goals/outcomes      Other Matters of the Heart: - Provides group verbal, written materials and models to describe Heart Failure, Angina, Valve Disease, and Diabetes in the realm of heart disease. Includes description of the disease process and treatment options available to the cardiac patient.      Cardiac Rehab from 06/30/2015 in [facility] Cardiac and Pulmonary Rehab   Date  06/14/15   Educator  SB   Instruction Review Code  2- meets goals/outcomes      Exercise & Equipment Safety: - Individual verbal instruction and demonstration of equipment use and safety with use of the equipment.      Cardiac Rehab from 06/30/2015 in [facility] Cardiac and Pulmonary Rehab   Date  05/26/15   Educator  D. Joya Gaskins, RN   Instruction Review Code  1- partially meets, needs review/practice      Infection Prevention: - Provides verbal and written material to individual with discussion of infection control including proper hand washing and proper equipment cleaning during exercise session.      Cardiac Rehab from 06/30/2015 in [facility] Cardiac and Pulmonary Rehab   Date  05/26/15   Educator  D.  Joya Gaskins, RN   Instruction Review Code  2- meets goals/outcomes      Falls Prevention: - Provides verbal and written material to individual with discussion of falls prevention and safety.  Cardiac Rehab from 06/30/2015 in [facility] Cardiac and Pulmonary Rehab   Date  05/26/15   Educator  D. Joya Gaskins, RN   Instruction Review Code  2- meets goals/outcomes      Diabetes: - Individual verbal and written instruction to review signs/symptoms of diabetes, desired ranges of glucose level fasting, after meals and with exercise. Advice that pre and post exercise glucose checks will be done for 3 sessions at entry of program.      Cardiac Rehab from 06/30/2015 in [facility] Cardiac and Pulmonary Rehab   Date  05/26/15   Educator  D. Joya Gaskins, RN   Instruction Review Code  2- meets goals/outcomes       Knowledge Questionnaire Score:     Knowledge Questionnaire Score - 06/08/15 1550    Knowledge Questionnaire Score   Pre Score 23/28      Core Components/Risk Factors/Patient Goals at Admission:     Personal Goals and Risk Factors at Admission - 05/26/15 1259    Core Components/Risk Factors/Patient Goals on Admission    Weight Management Yes   Intervention Weight Management: Develop a combined nutrition and exercise program designed to reach desired caloric intake, while maintaining appropriate intake of nutrient and fiber, sodium and fats, and appropriate energy expenditure required for the weight goal.;Weight Management: Provide education and appropriate resources to help participant work on and attain dietary goals.;Weight Management/Obesity: Establish reasonable short term and long term weight goals.   Admit Weight 183 lb 4.8 oz (83.144 kg)   Goal Weight: Short Term 183 lb 4.8 oz (83.144 kg)   Goal Weight: Long Term 183 lb 4.8 oz (83.144 kg)   Expected Outcomes Short Term: Continue to assess and modify interventions until short term weight is achieved;Long Term: Adherence to nutrition and physical  activity/exercise program aimed toward attainment of established weight goal;Weight Maintenance: Understanding of the daily nutrition guidelines, which includes 25-35% calories from fat, 7% or less cal from saturated fats, less than '200mg'$  cholesterol, less than 1.5gm of sodium, & 5 or more servings of fruits and vegetables daily;Understanding recommendations for meals to include 15-35% energy as protein, 25-35% energy from fat, 35-60% energy from carbohydrates, less than '200mg'$  of dietary cholesterol, 20-35 gm of total fiber daily;Understanding of distribution of calorie intake throughout the day with the consumption of 4-5 meals/snacks   Sedentary Yes   Intervention Provide advice, education, support and counseling about physical activity/exercise needs.;Develop an individualized exercise prescription for aerobic and resistive training based on initial evaluation findings, risk stratification, comorbidities and participant's personal goals.   Expected Outcomes Achievement of increased cardiorespiratory fitness and enhanced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

## 2015-07-07 ENCOUNTER — Encounter: Payer: Commercial Managed Care - HMO | Admitting: *Deleted

## 2015-07-07 DIAGNOSIS — I5022 Chronic systolic (congestive) heart failure: Secondary | ICD-10-CM

## 2015-07-07 NOTE — Progress Notes (Signed)
Daily Session Note  Patient Details  Name: Derrick Cochran MRN: [id number] Date of Birth: 03/02/1935 Referring Provider:    Encounter Date: 07/07/2015  Check In:     Session Check In - 07/07/15 0940    Check-In   Location [facility]   Staff Present Nyoka Cowden, RN;Adely Facer Luan Pulling, MA, ACSM RCEP, Exercise Physiologist;Amanda Oletta Darter, BA, ACSM CEP, Exercise Physiologist;Susanne Bice, RN, BSN, CCRP;Diane Joya Gaskins, RN, BSN   Supervising physician immediately available to respond to emergencies See telemetry face sheet for immediately available ER MD   Medication changes reported     No   Fall or balance concerns reported    No   Warm-up and Cool-down Performed on first and last piece of equipment   Resistance Training Performed Yes   VAD Patient? No   Pain Assessment   Currently in Pain? No/denies   Multiple Pain Sites No         Goals Met:  Independence with exercise equipment Exercise tolerated well No report of cardiac concerns or symptoms Strength training completed today  Goals Unmet:  Not Applicable  Comments: Pt able to follow exercise prescription today without complaint.  Will continue to monitor for progression.    Dr. Emily Filbert is Medical Director for [facility] and [facility].

## 2015-07-09 ENCOUNTER — Encounter: Payer: Commercial Managed Care - HMO | Admitting: *Deleted

## 2015-07-09 DIAGNOSIS — I5022 Chronic systolic (congestive) heart failure: Secondary | ICD-10-CM

## 2015-07-09 NOTE — Progress Notes (Signed)
Daily Session Note  Patient Details  Name: ELIAB CLOSSON MRN: [id number] Date of Birth: 03-10-1935 Referring Provider:    Encounter Date: 07/09/2015  Check In:     Session Check In - 07/09/15 0918    Check-In   Location [facility]   Staff Present Gerlene Burdock, RN, BSN;Susanne Bice, RN, BSN, CCRP;Jessica [place], MA, ACSM RCEP, Exercise Physiologist   Medication changes reported     No   Fall or balance concerns reported    No   Warm-up and Cool-down Performed on first and last piece of equipment   Resistance Training Performed No   Pain Assessment   Currently in Pain? No/denies         Goals Met:  Proper associated with RPD/PD & O2 Sat Exercise tolerated well  Goals Unmet:  Not Applicable  Comments:    Dr. Emily Filbert is Medical Director for [facility] and [facility].

## 2015-07-14 ENCOUNTER — Encounter: Payer: Commercial Managed Care - HMO | Admitting: *Deleted

## 2015-07-14 DIAGNOSIS — I5022 Chronic systolic (congestive) heart failure: Secondary | ICD-10-CM | POA: Diagnosis not present

## 2015-07-14 NOTE — Progress Notes (Signed)
Daily Session Note  Patient Details  Name: Derrick Cochran MRN: [id number] Date of Birth: 10/28/1935 Referring Provider:    Encounter Date: 07/14/2015  Check In:     Session Check In - 07/14/15 0836    Check-In   Location [facility]   Staff Present Mary Jo Abernethy, RN;Amanda Sommer, BA, ACSM CEP, Exercise Physiologist; , MA, ACSM RCEP, Exercise Physiologist;Diane Wright, RN, BSN   Supervising physician immediately available to respond to emergencies See telemetry face sheet for immediately available ER MD   Medication changes reported     No   Fall or balance concerns reported    No   Warm-up and Cool-down Performed on first and last piece of equipment   Resistance Training Performed Yes   VAD Patient? No   Pain Assessment   Currently in Pain? No/denies   Multiple Pain Sites No           Exercise Prescription Changes - 07/13/15 1300    Exercise Review   Progression (p) Yes   Response to Exercise   Blood Pressure (Admit) (p) 126/52 mmHg   Blood Pressure (Exercise) (p) 140/80 mmHg   Blood Pressure (Exit) (p) 108/52 mmHg   Heart Rate (Admit) (p) 77 bpm   Heart Rate (Exercise) (p) 98 bpm   Heart Rate (Exit) (p) 84 bpm   Rating of Perceived Exertion (Exercise) (p) 13   Symptoms (p) none   Duration (p) Progress to 45 minutes of aerobic exercise without signs/symptoms of physical distress   Intensity (p) THRR New  104-129   Progression   Progression (p) Continue to progress workloads to maintain intensity without signs/symptoms of physical distress.   Average METs (p) 3.4   Resistance Training   Training Prescription (p) Yes   Weight (p) 3 lbs   Reps (p) 10-15   Interval Training   Interval Training (p) Yes   Equipment (p) NuStep   Comments (p) intervals 2 min by 30 sec. L5/ 60-80 watts, 12-15 RPE   Treadmill   MPH (p) 1.5  1.3 for 25 min   Grade (p) 0   Minutes (p) 10   NuStep   Level (p) 5   Watts (p) 67  did not do  intervals since first introduced   Minutes (p) 13   REL-XR   Level (p) 5   Watts (p) 44   Minutes (p) 15      Goals Met:  Independence with exercise equipment Exercise tolerated well No report of cardiac concerns or symptoms Strength training completed today  Goals Unmet:  Not Applicable  Comments: Pt able to follow exercise prescription today without complaint.  Will continue to monitor for progression.  Reviewed individual exercise prescription with patient.  Changes were noted and patient was able to perform at new work load without signs or symptoms.  Dr. Mark Miller is Medical Director for [facility] and [facility]. 

## 2015-07-16 ENCOUNTER — Encounter: Payer: Commercial Managed Care - HMO | Attending: Internal Medicine | Admitting: *Deleted

## 2015-07-16 DIAGNOSIS — I5022 Chronic systolic (congestive) heart failure: Secondary | ICD-10-CM | POA: Insufficient documentation

## 2015-07-16 NOTE — Progress Notes (Signed)
Daily Session Note  Patient Details  Name: Derrick Cochran MRN: [id number] Date of Birth: 1935-09-08 Referring Provider:    Encounter Date: 07/16/2015  Check In:     Session Check In - 07/16/15 0913    Check-In   Staff Present Heath Lark, RN, BSN, CCRP;Carroll Enterkin, RN, BSN;Stacey Blanch Media, RRT, RCP, Respiratory Lennie Hummer, MA, ACSM RCEP, Exercise Physiologist   Supervising physician immediately available to respond to emergencies See telemetry face sheet for immediately available ER MD   Medication changes reported     No   Fall or balance concerns reported    No   Warm-up and Cool-down Performed on first and last piece of equipment   Resistance Training Performed No   Pain Assessment   Currently in Pain? No/denies         Goals Met:  Independence with exercise equipment Exercise tolerated well No report of cardiac concerns or symptoms Strength training completed today  Goals Unmet:  Not Applicable  Comments: Doing well with exercise prescription progression.    Dr. Emily Filbert is Medical Director for [facility].

## 2015-07-19 ENCOUNTER — Encounter: Payer: Commercial Managed Care - HMO | Admitting: *Deleted

## 2015-07-19 DIAGNOSIS — I5022 Chronic systolic (congestive) heart failure: Secondary | ICD-10-CM | POA: Diagnosis not present

## 2015-07-19 NOTE — Progress Notes (Signed)
Daily Session Note  Patient Details  Name: CLETE KUCH MRN: [id number] Date of Birth: Oct 28, 1935 Referring Provider:    Encounter Date: 07/19/2015  Check In:     Session Check In - 07/19/15 0852    Check-In   Location [facility]   Staff Present Alberteen Sam, MA, ACSM RCEP, Exercise Physiologist;Susanne Bice, RN, BSN, Laveda Norman, BS, ACSM CEP, Exercise Physiologist   Supervising physician immediately available to respond to emergencies See telemetry face sheet for immediately available ER MD   Medication changes reported     No   Fall or balance concerns reported    No   Warm-up and Cool-down Performed on first and last piece of equipment   Resistance Training Performed No   VAD Patient? No   Pain Assessment   Currently in Pain? No/denies   Multiple Pain Sites No         Goals Met:  Independence with exercise equipment Exercise tolerated well Strength training completed today  Goals Unmet:  Not Applicable  Comments: Pt able to follow exercise prescription today without complaint.  Will continue to monitor for progression.    Dr. Emily Filbert is Medical Director for [facility] and [facility].

## 2015-07-21 DIAGNOSIS — I5022 Chronic systolic (congestive) heart failure: Secondary | ICD-10-CM

## 2015-07-21 NOTE — Progress Notes (Signed)
Daily Session Note  Patient Details  Name: Derrick Cochran MRN: [id number] Date of Birth: 07/31/1935 Referring Provider:    Encounter Date: 07/21/2015  Check In:     Session Check In - 07/21/15 0842    Check-In   Location [facility]   Staff Present Carroll Enterkin, RN, BSN;Jessica Hawkins, MA, ACSM RCEP, Exercise Physiologist; , BA, ACSM CEP, Exercise Physiologist   Supervising physician immediately available to respond to emergencies See telemetry face sheet for immediately available ER MD   Medication changes reported     No   Fall or balance concerns reported    No   Warm-up and Cool-down Performed on first and last piece of equipment   Resistance Training Performed Yes   VAD Patient? No   Pain Assessment   Currently in Pain? No/denies   Multiple Pain Sites No         Goals Met:  Independence with exercise equipment Exercise tolerated well No report of cardiac concerns or symptoms Strength training completed today  Goals Unmet:  Not Applicable  Comments: Pt able to follow exercise prescription today without complaint.  Will continue to monitor for progression.   Dr. Mark Miller is Medical Director for [facility] and [facility]. 

## 2015-07-28 DIAGNOSIS — I482 Chronic atrial fibrillation: Secondary | ICD-10-CM | POA: Diagnosis not present

## 2015-07-30 ENCOUNTER — Encounter: Payer: Self-pay | Admitting: *Deleted

## 2015-07-30 ENCOUNTER — Telehealth: Payer: Self-pay | Admitting: *Deleted

## 2015-07-30 NOTE — Telephone Encounter (Signed)
Standley's wife called and said he is out sick and sorry he can't attend Cardiac Rehab right now.

## 2015-08-02 ENCOUNTER — Encounter: Payer: Commercial Managed Care - HMO | Admitting: *Deleted

## 2015-08-02 DIAGNOSIS — I5022 Chronic systolic (congestive) heart failure: Secondary | ICD-10-CM

## 2015-08-02 NOTE — Progress Notes (Signed)
Daily Session Note  Patient Details  Name: Derrick Cochran MRN: [id number] Date of Birth: [birth year] Referring Provider:        Cardiac Rehab from 07/21/2015 in [facility]   Referring Provider  Fulton Reek MD      Encounter Date: 08/02/2015  Check In:     Session Check In - 08/02/15 0810    Check-In   Location [facility]   Staff Present Alberteen Sam, MA, ACSM RCEP, Exercise Physiologist;Susanne Bice, RN, BSN, Laveda Norman, BS, ACSM CEP, Exercise Physiologist   Supervising physician immediately available to respond to emergencies See telemetry face sheet for immediately available ER MD   Medication changes reported     No   Fall or balance concerns reported    No   Warm-up and Cool-down Performed on first and last piece of equipment   Resistance Training Performed Yes   VAD Patient? No   Pain Assessment   Currently in Pain? No/denies   Multiple Pain Sites No         Goals Met:  Independence with exercise equipment Exercise tolerated well No report of cardiac concerns or symptoms Strength training completed today  Goals Unmet:  Not Applicable  Comments: Pt able to follow exercise prescription today without complaint.  Will continue to monitor for progression.    Dr. Emily Filbert is Medical Director for [facility] and [facility].

## 2015-08-04 ENCOUNTER — Encounter: Payer: Self-pay | Admitting: *Deleted

## 2015-08-04 ENCOUNTER — Encounter: Payer: Commercial Managed Care - HMO | Admitting: *Deleted

## 2015-08-04 DIAGNOSIS — I5022 Chronic systolic (congestive) heart failure: Secondary | ICD-10-CM | POA: Diagnosis not present

## 2015-08-04 NOTE — Progress Notes (Signed)
Cardiac Individual Treatment Plan  Patient Details  Name: Derrick Cochran MRN: [id number] Date of Birth: May 19, 1935 Referring Provider:        Cardiac Rehab from 07/21/2015 in [facility] Cardiac and Pulmonary Rehab   Referring Provider  Fulton Reek MD      Initial Encounter Date:       Cardiac Rehab from 07/21/2015 in [facility] Cardiac and Pulmonary Rehab   Referring Provider  Fulton Reek MD      Visit Diagnosis: Chronic systolic congestive heart failure ([place])  Patient's Home Medications on Admission:  Current outpatient prescriptions:  .  acetaminophen (TYLENOL) 500 MG tablet, Take 500 mg by mouth every 6 (six) hours as needed for mild pain, moderate pain or fever. , Disp: , Rfl:  .  aspirin EC 81 MG EC tablet, Take 1 tablet (81 mg total) by mouth daily., Disp: , Rfl:  .  cyanocobalamin 1000 MCG tablet, Take 1,000 mcg by mouth daily., Disp: , Rfl:  .  enalapril (VASOTEC) 5 MG tablet, Take 5 mg by mouth 2 (two) times daily. , Disp: , Rfl:  .  furosemide (LASIX) 20 MG tablet, Take 20 mg by mouth every morning. , Disp: , Rfl:  .  gabapentin (NEURONTIN) 300 MG capsule, Take 300 mg by mouth daily. , Disp: , Rfl:  .  glipiZIDE (GLUCOTROL XL) 5 MG 24 hr tablet, Take 5 mg by mouth every morning., Disp: , Rfl:  .  levothyroxine (SYNTHROID, LEVOTHROID) 137 MCG tablet, Take 137 mcg by mouth daily before breakfast. , Disp: , Rfl:  .  loratadine (CLARITIN) 10 MG tablet, Take 1 tablet by mouth daily., Disp: , Rfl:  .  LORazepam (ATIVAN) 1 MG tablet, Take 2 mg by mouth at bedtime., Disp: , Rfl:  .  metFORMIN (GLUCOPHAGE) 500 MG tablet, Take 500 mg by mouth 2 (two) times daily. , Disp: , Rfl:  .  metoprolol succinate (TOPROL-XL) 25 MG 24 hr tablet, Take 25 mg by mouth daily., Disp: , Rfl:  .  nitroGLYCERIN (NITROSTAT) 0.4 MG SL tablet, Place 0.4 mg under the tongue every 5 (five) minutes as needed for chest pain., Disp: , Rfl:  .  omeprazole (PRILOSEC) 20 MG capsule, Take 20 mg by mouth every morning. ,  Disp: , Rfl:  .  simvastatin (ZOCOR) 40 MG tablet, Take 40 mg by mouth every evening. , Disp: , Rfl:  .  sotalol (BETAPACE) 80 MG tablet, Take 1 tablet (80 mg total) by mouth 2 (two) times daily., Disp: 60 tablet, Rfl: 5 .  tamsulosin (FLOMAX) 0.4 MG CAPS capsule, Take 0.4 mg by mouth every morning. , Disp: , Rfl:  .  temazepam (RESTORIL) 15 MG capsule, Take 15 mg by mouth at bedtime. , Disp: , Rfl:  .  warfarin (COUMADIN) 1 MG tablet, Take 1 mg by mouth every evening. Pt takes with 60m tablet for a total dose of 424m, Disp: , Rfl:  .  warfarin (COUMADIN) 3 MG tablet, Take 3 mg by mouth every evening. Pt takes with 71m471mablet for a total dose of 4mg93mDisp: , Rfl:   Past Medical History: Past Medical History  Diagnosis Date  . Coronary artery disease   . Hypertension   . Myocardial infarction (HCC)[place]. Hypothyroidism   . Diabetes mellitus without complication (HCC)[place]. GERD (gastroesophageal reflux disease)   . Arthritis   . Heart murmur   . Shortness of breath dyspnea   . Cancer (HCC)[place]  skin  . CHF (congestive heart failure) ([place])   . Arrhythmia     atrial fibrillation  . AAA (abdominal aortic aneurysm) ([place])   . Esophageal stricture   . BPH (benign prostatic hyperplasia)   . DDD (degenerative disc disease), thoracic   . Hyperlipidemia     Tobacco Use: History  Smoking status  . Former Smoker -- 0.50 packs/day for 15 years  . Types: Cigarettes  . Quit date: 04/22/1985  Smokeless tobacco  . Former Systems developer  . Types: Chew    Labs: Recent Review Flowsheet Data    There is no flowsheet data to display.       Exercise Target Goals:    Exercise Program Goal: Individual exercise prescription set with THRR, safety & activity barriers. Participant demonstrates ability to understand and report RPE using BORG scale, to self-measure pulse accurately, and to acknowledge the importance of the exercise prescription.  Exercise Prescription Goal: Starting with aerobic activity  30 plus minutes a day, 3 days per week for initial exercise prescription. Provide home exercise prescription and guidelines that participant acknowledges understanding prior to discharge.  Activity Barriers & Risk Stratification:     Activity Barriers & Cardiac Risk Stratification - 05/26/15 1253    Activity Barriers & Cardiac Risk Stratification   Activity Barriers Arthritis;Back Problems;Shortness of Breath   Cardiac Risk Stratification High      6 Minute Walk:     6 Minute Walk      05/26/15 1209       6 Minute Walk   Phase Initial     Distance 1075 feet     Walk Time 6 minutes     RPE 11     Perceived Dyspnea  3     Symptoms Yes (comment)  "just my legs and hips feel weak"     Resting HR 78 bpm     Resting BP 110/70 mmHg     Max Ex. HR 115 bpm     Max Ex. BP 102/60 mmHg        Initial Exercise Prescription:     Initial Exercise Prescription - 07/27/15 1400    Date of Initial Exercise RX and Referring Provider   Referring Provider Fulton Reek MD      Perform Capillary Blood Glucose checks as needed.  Exercise Prescription Changes:     Exercise Prescription Changes      06/28/15 0900 07/13/15 1300 07/27/15 1400       Exercise Review   Progression Yes (p) Yes Yes     Response to Exercise   Blood Pressure (Admit) 110/64 mmHg (p) 126/52 mmHg 104/64 mmHg     Blood Pressure (Exercise) 126/72 mmHg (p) 140/80 mmHg 162/70 mmHg     Blood Pressure (Exit) 112/82 mmHg (p) 108/52 mmHg 92/60 mmHg     Heart Rate (Admit) 97 bpm (p) 77 bpm 80 bpm     Heart Rate (Exercise) 100 bpm (p) 98 bpm 102 bpm     Heart Rate (Exit) 82 bpm (p) 84 bpm 87 bpm     Rating of Perceived Exertion (Exercise) 13 (p) 13 13     Symptoms none (p) none none     Duration Progress to 45 minutes of aerobic exercise without signs/symptoms of physical distress (p) Progress to 45 minutes of aerobic exercise without signs/symptoms of physical distress Progress to 45 minutes of aerobic exercise  without signs/symptoms of physical distress     Intensity Rest + 30 (p) THRR New  104-129  THRR unchanged     Progression   Progression Continue progressive overload as per policy without signs/symptoms or physical distress. (p) Continue to progress workloads to maintain intensity without signs/symptoms of physical distress. Continue to progress workloads to maintain intensity without signs/symptoms of physical distress.     Average METs  (p) 3.4 2.68     Resistance Training   Training Prescription Yes (p) Yes Yes     Weight 2 (p) 3 lbs 3 lbs     Reps 10-15 (p) 10-15 10-15     Interval Training   Interval Training Yes (p) Yes Yes     Equipment NuStep (p) NuStep NuStep     Comments intervals 2 min by 30 sec. L5/ 60-80 watts, 12-15 RPE (p) intervals 2 min by 30 sec. L5/ 60-80 watts, 12-15 RPE intervals 2 min by 30 sec. L5/ 60-80 watts, 12-15 RPE     Treadmill   MPH 1.5 (p) 1.5  1.3 for 25 min 1.5     Grade 0 (p) 0 0     Minutes 15 (p) 10 15     Bike   Level   0.9     Minutes   15     NuStep   Level 5 (p) 5 6     Watts 80  intervals 60-80 watts (p) 67  did not do intervals since first introduced 60     Minutes 15 (p) 13 15     REL-XR   Level 5 (p) 5 5     Watts 40 (p) 44 60     Minutes 15 (p) 15 15     Biostep-RELP   Level 3       Watts 25       Minutes 15          Exercise Comments:     Exercise Comments      06/02/15 0616 06/02/15 1017 06/28/15 0945 07/07/15 0921 07/27/15 1457   Exercise Comments Chart Review: Attended medical reiew but has not yet started cardiac rehab classes.  First day of exercise! Derrick Cochran was oriented to the gym and the equipment functions and settings. Procedures and policies of the gym were outlined and explained. The patient's individual exercise prescription and treatment plan were reviewed with him. All starting workloads were established based on the results of the functional testing  done at the initial intake visit. The plan for exercise  progression was also introduced and progression will be customized based on the patient's performance and goals.  Talked with Derrick Cochran about his fitness goals. He would like to be able to walk and mow the yard again. He stated that his back pain is what mostly limits him (he stated that he is planning to go backto his chiropractor which has helped him in the past) as well as leg strengh. He has noticed a slight increase in strengh and energy since starting his exercise program. Interval training was discussed with the patient with regards to benefits and recommendations. See exercise prescription for details. Expected outcome:  back pain can be gotten under control and leg strengh can be increased from regular exercise. Follow up with walking and mowing goal.  Spoke with Derrick Cochran today and he stated his legs are feeling stronger and he can get up out of a chair more easily As Derrick Cochran is getting stronger he is increasing his workloads.  He has worked his way up to 15 min on the treadmill.  We will now continue to work  with him increase his workloads.      Discharge Exercise Prescription (Final Exercise Prescription Changes):     Exercise Prescription Changes - 07/27/15 1400    Exercise Review   Progression Yes   Response to Exercise   Blood Pressure (Admit) 104/64 mmHg   Blood Pressure (Exercise) 162/70 mmHg   Blood Pressure (Exit) 92/60 mmHg   Heart Rate (Admit) 80 bpm   Heart Rate (Exercise) 102 bpm   Heart Rate (Exit) 87 bpm   Rating of Perceived Exertion (Exercise) 13   Symptoms none   Duration Progress to 45 minutes of aerobic exercise without signs/symptoms of physical distress   Intensity THRR unchanged   Progression   Progression Continue to progress workloads to maintain intensity without signs/symptoms of physical distress.   Average METs 2.68   Resistance Training   Training Prescription Yes   Weight 3 lbs   Reps 10-15   Interval Training   Interval Training Yes   Equipment NuStep    Comments intervals 2 min by 30 sec. L5/ 60-80 watts, 12-15 RPE   Treadmill   MPH 1.5   Grade 0   Minutes 15   Bike   Level 0.9   Minutes 15   NuStep   Level 6   Watts 60   Minutes 15   REL-XR   Level 5   Watts 60   Minutes 15      Nutrition:  Target Goals: Understanding of nutrition guidelines, daily intake of sodium <1542m, cholesterol <2016m calories 30% from fat and 7% or less from saturated fats, daily to have 5 or more servings of fruits and vegetables.  Biometrics:     Pre Biometrics - 05/26/15 1210    Pre Biometrics   Waist Circumference 41.5 inches   Hip Circumference 39.2 inches   Waist to Hip Ratio 1.06 %       Nutrition Therapy Plan and Nutrition Goals:     Nutrition Therapy & Goals - 06/22/15 1221    Nutrition Therapy   Diet Instructed patient and his wife on meal plan based on heart healthy principles and diabetes diabetes guidelines. They have already made many positive diet changes.   Drug/Food Interactions Statins/Certain Fruits   Protein (specify units) 8   Fiber 30 grams   Whole Grain Foods 3 servings   Saturated Fats 13 max. grams   Fruits and Vegetables 5 servings/day   Sodium 2000 grams   Personal Nutrition Goals   Personal Goal #1 Use calorieking.com to look up saturated fat, trans fat and sodium especially when dining out   Personal Goal #2 Read labels for saturated and trans fat and sodium.   Personal Goal #3 Include at least 5 servings of fruits/vegetables daily.   Personal Goal #4 Balance meals with protein, 3-4 servings of carbohydrate and non-starchy vegetables.   Intervention Plan   Intervention Prescribe, educate and counsel regarding individualized specific dietary modifications aiming towards targeted core components such as weight, hypertension, lipid management, diabetes, heart failure and other comorbidities.;Nutrition handout(s) given to patient.   Expected Outcomes Short Term Goal: Understand basic principles of dietary  content, such as calories, fat, sodium, cholesterol and nutrients.;Short Term Goal: A plan has been developed with personal nutrition goals set during dietitian appointment.;Long Term Goal: Adherence to prescribed nutrition plan.      Nutrition Discharge: Rate Your Plate Scores:     Nutrition Assessments - 06/22/15 1230    Rate Your Plate Scores   Pre Score 71  Pre Score % 79 %      Nutrition Goals Re-Evaluation:   Psychosocial: Target Goals: Acknowledge presence or absence of depression, maximize coping skills, provide positive support system. Participant is able to verbalize types and ability to use techniques and skills needed for reducing stress and depression.  Initial Review & Psychosocial Screening:     Initial Psych Review & Screening - 05/26/15 1436    Screening Interventions   Interventions --  Patient denies suicidal ideation.        Quality of Life Scores:     Quality of Life - 06/08/15 1549    Quality of Life Scores   Health/Function Pre 19.2 %   Socioeconomic Pre 30 %   Psych/Spiritual Pre 27.43 %   Family Pre 30 %   GLOBAL Pre 24.71 %      PHQ-9:     Recent Review Flowsheet Data    Depression screen [facility] 2/9 05/26/2015 05/20/2015 04/23/2015   Decreased Interest 3 0 0   Down, Depressed, Hopeless 2 0 0   PHQ - 2 Score 5 0 0   Altered sleeping 3 - -   Tired, decreased energy 3 - -   Change in appetite 2 - -   Feeling bad or failure about yourself  2 - -   Trouble concentrating 0 - -   Moving slowly or fidgety/restless 2 - -   Suicidal thoughts 0 - -   PHQ-9 Score 17 - -   Difficult doing work/chores Somewhat difficult - -      Psychosocial Evaluation and Intervention:     Psychosocial Evaluation - 06/02/15 1202    Psychosocial Evaluation & Interventions   Interventions Encouraged to exercise with the program and follow exercise prescription   Comments Counselor met with Derrick Cochran today for initial psychosocial evaluation.  He was  accompanied by his spouse of 2 years.  Derrick Cochran has a strong support system with his spouse, a brother who lives close by and active involvement in his local church.  He has diabetes as well as CHF.  Derrick Cochran scored intially a "17" on the PHQ-9 which  is moderately severe for depression.  He and his spouse reported there has been a change of medications in the past 2 weeks which have made a significant difference in his mood and energy levels as well as improved sleep.  Therapist reviewed and revised the PHQ-9 with a new score of "3"  which is a drastic improvement with this being attributed to the medication adjustment and addition of sleep meds.  He denies current symptoms of depression or anxiety and states his mood is positive at this time.  He also reports other than his health issues, he has minimal stress in his life currently. Derrick Cochran has goals for this program to increase his energy, stamina and to breathe better.  He loves to walk so will plan to follow this program [facility]      Psychosocial Re-Evaluation:     Psychosocial Re-Evaluation      06/09/15 1019 06/16/15 1037 06/21/15 0950 06/30/15 1001     Psychosocial Re-Evaluation   Interventions  Encouraged to attend Cardiac Rehabilitation for the exercise      Comments Counselor follow up with Derrick Cochran today reorting he is already feeling stronger since beginning this program.  He also is able to walk better and reorts his mood continues to be positive.  He also mentioned  he may have forgotten one of his medications yesterday and did not feel as well all day.  Counselor encouraged the daily pill boxes to remember and be better organized concerning taking his medications daily.   Derrick Cochran. [place] left a vm that Derrick Cochran was sorry that he was sick and would be unable to attend Cardiac Rehab today.  Derrick Cochran said CR has helped his leg strength so he is glad for that. His wife is very supportive.  Counselor  follow up with Derrick Cochran exclaiming "I did 30 minutes on this machine today!"  Counselor commended Derrick Cochran on his hard work and commitment to exercise.  He also reports noting his core strength is better as he is able to get up and down with greater ease.  His mood continues to be positive and he states he is taking his medications daily and more consistently than previously.         Vocational Rehabilitation: Provide vocational rehab assistance to qualifying candidates.   Vocational Rehab Evaluation & Intervention:     Vocational Rehab - 05/26/15 1255    Initial Vocational Rehab Evaluation & Intervention   Assessment shows need for Vocational Rehabilitation No      Education: Education Goals: Education classes will be provided on a weekly basis, covering required topics. Participant will state understanding/return demonstration of topics presented.  Learning Barriers/Preferences:     Learning Barriers/Preferences - 05/26/15 1254    Learning Barriers/Preferences   Learning Barriers Hearing;Exercise Concerns   Learning Preferences None      Education Topics: General Nutrition Guidelines/Fats and Fiber: -Group instruction provided by verbal, written material, models and posters to present the general guidelines for heart healthy nutrition. Gives an explanation and review of dietary fats and fiber.          Cardiac Rehab from 08/02/2015 in [facility] Cardiac and Pulmonary Rehab   Date  07/19/15   Educator  Jaclyn Shaggy   Instruction Review Code  2- meets goals/outcomes      Controlling Sodium/Reading Food Labels: -Group verbal and written material supporting the discussion of sodium use in heart healthy nutrition. Review and explanation with models, verbal and written materials for utilization of the food label.   Exercise Physiology & Risk Factors: - Group verbal and written instruction with models to review the exercise physiology of the cardiovascular system and associated  critical values. Details cardiovascular disease risk factors and the goals associated with each risk factor.      Cardiac Rehab from 08/02/2015 in [facility] Cardiac and Pulmonary Rehab   Date  06/02/15   Educator  BS   Instruction Review Code  2- meets goals/outcomes      Aerobic Exercise & Resistance Training: - Gives group verbal and written discussion on the health impact of inactivity. On the components of aerobic and resistive training programs and the benefits of this training and how to safely progress through these programs.      Cardiac Rehab from 08/02/2015 in [facility] Cardiac and Pulmonary Rehab   Date  08/02/15   Educator  [facility]   Instruction Review Code  R- Review/reinforce [Second Class]      Flexibility, Balance, General Exercise Guidelines: - Provides group verbal and written instruction on the benefits of flexibility and balance training programs. Provides general exercise guidelines with specific guidelines to those with heart or lung disease. Demonstration and skill practice provided.      Cardiac Rehab from 08/02/2015 in [facility] Cardiac and Pulmonary Rehab   Date  06/09/15  Educator  bs   Instruction Review Code  2- meets goals/outcomes      Stress Management: - Provides group verbal and written instruction about the health risks of elevated stress, cause of high stress, and healthy ways to reduce stress.   Depression: - Provides group verbal and written instruction on the correlation between heart/lung disease and depressed mood, treatment options, and the stigmas associated with seeking treatment.      Cardiac Rehab from 08/02/2015 in [facility] Cardiac and Pulmonary Rehab   Date  07/14/15   Educator  [facility]   Instruction Review Code  2- meets goals/outcomes      Anatomy & Physiology of the Heart: - Group verbal and written instruction and models provide basic cardiac anatomy and physiology, with the coronary electrical and arterial systems. Review of: AMI, Angina, Valve disease,  Heart Failure, Cardiac Arrhythmia, Pacemakers, and the ICD.      Cardiac Rehab from 08/02/2015 in [facility] Cardiac and Pulmonary Rehab   Date  06/14/15   Educator  SB   Instruction Review Code  2- meets goals/outcomes      Cardiac Procedures: - Group verbal and written instruction and models to describe the testing methods done to diagnose heart disease. Reviews the outcomes of the test results. Describes the treatment choices: Medical Management, Angioplasty, or Coronary Bypass Surgery.      Cardiac Rehab from 08/02/2015 in [facility] Cardiac and Pulmonary Rehab   Date  06/21/15   Educator  CE   Instruction Review Code  2- meets goals/outcomes      Cardiac Medications: - Group verbal and written instruction to review commonly prescribed medications for heart disease. Reviews the medication, class of the drug, and side effects. Includes the steps to properly store meds and maintain the prescription regimen.      Cardiac Rehab from 08/02/2015 in [facility] Cardiac and Pulmonary Rehab   Date  06/30/15   Educator  SB   Instruction Review Code  2- meets goals/outcomes [Part II:  Cardiac Medications]      Go Sex-Intimacy & Heart Disease, Get SMART - Goal Setting: - Group verbal and written instruction through game format to discuss heart disease and the return to sexual intimacy. Provides group verbal and written material to discuss and apply goal setting through the application of the S.M.A.R.T. Method.      Cardiac Rehab from 08/02/2015 in [facility] Cardiac and Pulmonary Rehab   Date  06/21/15   Educator  CE   Instruction Review Code  2- meets goals/outcomes      Other Matters of the Heart: - Provides group verbal, written materials and models to describe Heart Failure, Angina, Valve Disease, and Diabetes in the realm of heart disease. Includes description of the disease process and treatment options available to the cardiac patient.      Cardiac Rehab from 08/02/2015 in [facility] Cardiac and Pulmonary Rehab    Date  06/14/15   Educator  SB   Instruction Review Code  2- meets goals/outcomes      Exercise & Equipment Safety: - Individual verbal instruction and demonstration of equipment use and safety with use of the equipment.      Cardiac Rehab from 08/02/2015 in [facility] Cardiac and Pulmonary Rehab   Date  05/26/15   Educator  D. Joya Gaskins, RN   Instruction Review Code  1- partially meets, needs review/practice      Infection Prevention: - Provides verbal and written material to individual with discussion of infection control including proper hand  washing and proper equipment cleaning during exercise session.      Cardiac Rehab from 08/02/2015 in [facility] Cardiac and Pulmonary Rehab   Date  05/26/15   Educator  D. Joya Gaskins, RN   Instruction Review Code  2- meets goals/outcomes      Falls Prevention: - Provides verbal and written material to individual with discussion of falls prevention and safety.      Cardiac Rehab from 08/02/2015 in [facility] Cardiac and Pulmonary Rehab   Date  05/26/15   Educator  D. Joya Gaskins, RN   Instruction Review Code  2- meets goals/outcomes      Diabetes: - Individual verbal and written instruction to review signs/symptoms of diabetes, desired ranges of glucose level fasting, after meals and with exercise. Advice that pre and post exercise glucose checks will be done for 3 sessions at entry of program.      Cardiac Rehab from 08/02/2015 in [facility] Cardiac and Pulmonary Rehab   Date  05/26/15   Educator  D. Joya Gaskins, RN   Instruction Review Code  2- meets goals/outcomes       Knowledge Questionnaire Score:     Knowledge Questionnaire Score - 06/08/15 1550    Knowledge Questionnaire Score   Pre Score 23/28      Core Components/Risk Factors/Patient Goals at Admission:     Personal Goals and Risk Factors at Admission - 05/26/15 1259    Core Components/Risk Factors/Patient Goals on Admission    Weight Management Yes   Intervention Weight Management: Develop a combined  nutrition and exercise program designed to reach desired caloric intake, while maintaining appropriate intake of nutrient and fiber, sodium and fats, and appropriate energy expenditure required for the weight goal.;Weight Management: Provide education and appropriate resources to help participant work on and attain dietary goals.;Weight Management/Obesity: Establish reasonable short term and long term weight goals.   Admit Weight 183 lb 4.8 oz (83.144 kg)   Goal Weight: Short Term 183 lb 4.8 oz (83.144 kg)   Goal Weight: Long Term 183 lb 4.8 oz (83.144 kg)   Expected Outcomes Short Term: Continue to assess and modify interventions until short term weight is achieved;Long Term: Adherence to nutrition and physical activity/exercise program aimed toward attainment of established weight goal;Weight Maintenance: Understanding of the daily nutrition guidelines,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

## 2015-08-04 NOTE — Progress Notes (Signed)
Daily Session Note  Patient Details  Name: Derrick Cochran MRN: [id number] Date of Birth: March 31, 1935 Referring Provider:        Cardiac Rehab from 07/21/2015 in [facility] Cardiac [facility] Rehab   Referring Provider  Fulton Reek MD      Encounter Date: 08/04/2015  Check In:     Session Check In - 08/04/15 0751    Check-In   Location [facility]-Cardiac & Pulmonary Rehab   Staff Present Alberteen Sam, MA, ACSM RCEP, Exercise Physiologist;Amanda Oletta Darter, BA, ACSM CEP, Exercise Physiologist;Carroll Enterkin, RN, BSN   Supervising physician immediately available to respond to emergencies See telemetry face sheet for immediately available ER MD   Medication changes reported     No   Fall or balance concerns reported    No   Warm-up and Cool-down Performed on first and last piece of equipment   Resistance Training Performed Yes   VAD Patient? No   Pain Assessment   Currently in Pain? No/denies   Multiple Pain Sites No         Goals Met:  Independence with exercise equipment Exercise tolerated well No report of cardiac concerns or symptoms Strength training completed today Home Exercise Guidelines Reviewed  Goals Unmet:  Not Applicable  Comments: Pt able to follow exercise prescription today without complaint.  Will continue to monitor for progression.    Dr. Emily Filbert is Medical Director for [place] and [facility].

## 2015-08-05 DIAGNOSIS — I10 Essential (primary) hypertension: Secondary | ICD-10-CM | POA: Diagnosis not present

## 2015-08-05 DIAGNOSIS — I2119 ST elevation (STEMI) myocardial infarction involving other coronary artery of inferior wall: Secondary | ICD-10-CM | POA: Diagnosis not present

## 2015-08-05 DIAGNOSIS — I495 Sick sinus syndrome: Secondary | ICD-10-CM | POA: Diagnosis not present

## 2015-08-05 DIAGNOSIS — I5022 Chronic systolic (congestive) heart failure: Secondary | ICD-10-CM | POA: Diagnosis not present

## 2015-08-05 DIAGNOSIS — Z9889 Other specified postprocedural states: Secondary | ICD-10-CM | POA: Diagnosis not present

## 2015-08-05 DIAGNOSIS — I480 Paroxysmal atrial fibrillation: Secondary | ICD-10-CM | POA: Diagnosis not present

## 2015-08-05 DIAGNOSIS — I714 Abdominal aortic aneurysm, without rupture: Secondary | ICD-10-CM | POA: Diagnosis not present

## 2015-08-05 DIAGNOSIS — I214 Non-ST elevation (NSTEMI) myocardial infarction: Secondary | ICD-10-CM | POA: Diagnosis not present

## 2015-08-05 DIAGNOSIS — I350 Nonrheumatic aortic (valve) stenosis: Secondary | ICD-10-CM | POA: Diagnosis not present

## 2015-08-06 DIAGNOSIS — I5022 Chronic systolic (congestive) heart failure: Secondary | ICD-10-CM

## 2015-08-06 NOTE — Progress Notes (Signed)
Daily Session Note  Patient Details  Name: Derrick Cochran MRN: [id number] Date of Birth: 1935-12-23 Referring Provider:        Cardiac Rehab from 07/21/2015 in [facility] Cardiac and Pulmonary Rehab   Referring Provider  Fulton Reek MD      Encounter Date: 08/06/2015  Check In:     Session Check In - 08/06/15 0817    Check-In   Location [facility]-Cardiac & Pulmonary Rehab   Staff Present Gerlene Burdock, RN, BSN;Khyla Mccumbers, DPT, Everette Rank, MA, ACSM RCEP, Exercise Physiologist   Supervising physician immediately available to respond to emergencies See telemetry face sheet for immediately available ER MD   Medication changes reported     No   Fall or balance concerns reported    No   Warm-up and Cool-down Performed on first and last piece of equipment   Resistance Training Performed Yes   VAD Patient? No   Pain Assessment   Currently in Pain? No/denies   Multiple Pain Sites No         Goals Met:  Exercise tolerated well No report of cardiac concerns or symptoms  Goals Unmet:  Not Applicable  Comments: Patient completed exercise prescription and all exercise goals during rehab session. The exercise was tolerated well and the patient is progressing in the program.   Dr. Emily Filbert is Medical Director for [place] and [facility].

## 2015-08-09 ENCOUNTER — Encounter: Payer: Commercial Managed Care - HMO | Admitting: *Deleted

## 2015-08-09 VITALS — Ht 70.0 in | Wt 183.1 lb

## 2015-08-09 DIAGNOSIS — I5022 Chronic systolic (congestive) heart failure: Secondary | ICD-10-CM

## 2015-08-09 NOTE — Progress Notes (Signed)
Daily Session Note  Patient Details  Name: Derrick Cochran MRN: [id number] Date of Birth: [birth year] Referring Provider:        Cardiac Rehab from 07/21/2015 in [facility] Cardiac and Pulmonary Rehab   Referring Provider  Fulton Reek MD      Encounter Date: 08/09/2015  Check In:     Session Check In - 08/09/15 1554    Check-In   Location [facility]-Cardiac & Pulmonary Rehab   Staff Present Nyoka Cowden, RN, BSN, MA;Klaryssa Fauth, RN, BSN, Laveda Norman, BS, ACSM CEP, Exercise Physiologist   Supervising physician immediately available to respond to emergencies See telemetry face sheet for immediately available ER MD   Medication changes reported     No   Fall or balance concerns reported    No   Warm-up and Cool-down Performed on first and last piece of equipment   Resistance Training Performed No   VAD Patient? No   Pain Assessment   Currently in Pain? No/denies         Goals Met:  Independence with exercise equipment Exercise tolerated well Personal goals reviewed No report of cardiac concerns or symptoms  Goals Unmet:  Not Applicable  Comments: completed discharge 6 min walk     6 Minute Walk      05/26/15 1209 08/09/15 0954     6 Minute Walk   Phase Initial Discharge    Distance 1075 feet 1233 feet    Distance % Change  0.15 %    Walk Time 6 minutes 6 minutes    # of Rest Breaks  0    RPE 11 13    Perceived Dyspnea  3     Symptoms Yes (comment)  "just my legs and hips feel weak" No    Resting HR 78 bpm 80 bpm    Resting BP 110/70 mmHg 108/57 mmHg    Max Ex. HR 115 bpm 102 bpm    Max Ex. BP 102/60 mmHg 110/52 mmHg          Dr. Emily Filbert is Medical Director for [place] and [facility].

## 2015-08-11 DIAGNOSIS — I5022 Chronic systolic (congestive) heart failure: Secondary | ICD-10-CM

## 2015-08-11 NOTE — Progress Notes (Signed)
Cardiac Individual Treatment Plan  Patient Details  Name: Derrick Cochran MRN: [id number] Date of Birth: May 19, 1935 Referring Provider:        Cardiac Rehab from 07/21/2015 in [facility] Cardiac and Pulmonary Rehab   Referring Provider  Fulton Reek MD      Initial Encounter Date:       Cardiac Rehab from 07/21/2015 in [facility] Cardiac and Pulmonary Rehab   Referring Provider  Fulton Reek MD      Visit Diagnosis: Chronic systolic congestive heart failure ([place])  Patient's Home Medications on Admission:  Current outpatient prescriptions:  .  acetaminophen (TYLENOL) 500 MG tablet, Take 500 mg by mouth every 6 (six) hours Derrick needed for mild pain, moderate pain or fever. , Disp: , Rfl:  .  aspirin EC 81 MG EC tablet, Take 1 tablet (81 mg total) by mouth daily., Disp: , Rfl:  .  cyanocobalamin 1000 MCG tablet, Take 1,000 mcg by mouth daily., Disp: , Rfl:  .  enalapril (VASOTEC) 5 MG tablet, Take 5 mg by mouth 2 (two) times daily. , Disp: , Rfl:  .  furosemide (LASIX) 20 MG tablet, Take 20 mg by mouth every morning. , Disp: , Rfl:  .  gabapentin (NEURONTIN) 300 MG capsule, Take 300 mg by mouth daily. , Disp: , Rfl:  .  glipiZIDE (GLUCOTROL XL) 5 MG 24 hr tablet, Take 5 mg by mouth every morning., Disp: , Rfl:  .  levothyroxine (SYNTHROID, LEVOTHROID) 137 MCG tablet, Take 137 mcg by mouth daily before breakfast. , Disp: , Rfl:  .  loratadine (CLARITIN) 10 MG tablet, Take 1 tablet by mouth daily., Disp: , Rfl:  .  LORazepam (ATIVAN) 1 MG tablet, Take 2 mg by mouth at bedtime., Disp: , Rfl:  .  metFORMIN (GLUCOPHAGE) 500 MG tablet, Take 500 mg by mouth 2 (two) times daily. , Disp: , Rfl:  .  metoprolol succinate (TOPROL-XL) 25 MG 24 hr tablet, Take 25 mg by mouth daily., Disp: , Rfl:  .  nitroGLYCERIN (NITROSTAT) 0.4 MG SL tablet, Place 0.4 mg under the tongue every 5 (five) minutes Derrick needed for chest pain., Disp: , Rfl:  .  omeprazole (PRILOSEC) 20 MG capsule, Take 20 mg by mouth every morning. ,  Disp: , Rfl:  .  simvastatin (ZOCOR) 40 MG tablet, Take 40 mg by mouth every evening. , Disp: , Rfl:  .  sotalol (BETAPACE) 80 MG tablet, Take 1 tablet (80 mg total) by mouth 2 (two) times daily., Disp: 60 tablet, Rfl: 5 .  tamsulosin (FLOMAX) 0.4 MG CAPS capsule, Take 0.4 mg by mouth every morning. , Disp: , Rfl:  .  temazepam (RESTORIL) 15 MG capsule, Take 15 mg by mouth at bedtime. , Disp: , Rfl:  .  warfarin (COUMADIN) 1 MG tablet, Take 1 mg by mouth every evening. Pt takes with 60m tablet for a total dose of 424m, Disp: , Rfl:  .  warfarin (COUMADIN) 3 MG tablet, Take 3 mg by mouth every evening. Pt takes with 71m471mablet for a total dose of 4mg93mDisp: , Rfl:   Past Medical History: Past Medical History  Diagnosis Date  . Coronary artery disease   . Hypertension   . Myocardial infarction (HCC)[place]. Hypothyroidism   . Diabetes mellitus without complication (HCC)[place]. GERD (gastroesophageal reflux disease)   . Arthritis   . Heart murmur   . Shortness of breath dyspnea   . Cancer (HCC)[place]  skin  . CHF (congestive heart failure) ([place])   . Arrhythmia     atrial fibrillation  . AAA (abdominal aortic aneurysm) ([place])   . Esophageal stricture   . BPH (benign prostatic hyperplasia)   . DDD (degenerative disc disease), thoracic   . Hyperlipidemia     Tobacco Use: History  Smoking status  . Former Smoker -- 0.50 packs/day for 15 years  . Types: Cigarettes  . Quit date: 04/22/1985  Smokeless tobacco  . Former Systems developer  . Types: Chew    Labs: Recent Review Flowsheet Data    There is no flowsheet data to display.       Exercise Target Goals:    Exercise Program Goal: Individual exercise prescription set with THRR, safety & activity barriers. Participant demonstrates ability to understand and report RPE using BORG scale, to self-measure pulse accurately, and to acknowledge the importance of the exercise prescription.  Exercise Prescription Goal: Starting with aerobic activity  30 plus minutes a day, 3 days per week for initial exercise prescription. Provide home exercise prescription and guidelines that participant acknowledges understanding prior to discharge.  Activity Barriers & Risk Stratification:     Activity Barriers & Cardiac Risk Stratification - 05/26/15 1253    Activity Barriers & Cardiac Risk Stratification   Activity Barriers Arthritis;Back Problems;Shortness of Breath   Cardiac Risk Stratification High      6 Minute Walk:     6 Minute Walk      05/26/15 1209 08/09/15 0954     6 Minute Walk   Phase Initial Discharge    Distance 1075 feet 1233 feet    Distance % Change  0.15 %    Walk Time 6 minutes 6 minutes    # of Rest Breaks  0    RPE 11 13    Perceived Dyspnea  3     Symptoms Yes (comment)  "just my legs and hips feel weak" No    Resting HR 78 bpm 80 bpm    Resting BP 110/70 mmHg 108/57 mmHg    Max Ex. HR 115 bpm 102 bpm    Max Ex. BP 102/60 mmHg 110/52 mmHg       Initial Exercise Prescription:     Initial Exercise Prescription - 07/27/15 1400    Date of Initial Exercise RX and Referring Provider   Referring Provider Fulton Reek MD      Perform Capillary Blood Glucose checks Derrick needed.  Exercise Prescription Changes:     Exercise Prescription Changes      06/28/15 0900 07/13/15 1300 07/27/15 1400 08/04/15 0900     Exercise Review   Progression Yes (p) Yes Yes Yes    Response to Exercise   Blood Pressure (Admit) 110/64 mmHg (p) 126/52 mmHg 104/64 mmHg     Blood Pressure (Exercise) 126/72 mmHg (p) 140/80 mmHg 162/70 mmHg     Blood Pressure (Exit) 112/82 mmHg (p) 108/52 mmHg 92/60 mmHg     Heart Rate (Admit) 97 bpm (p) 77 bpm 80 bpm     Heart Rate (Exercise) 100 bpm (p) 98 bpm 102 bpm     Heart Rate (Exit) 82 bpm (p) 84 bpm 87 bpm     Rating of Perceived Exertion (Exercise) 13 (p) 13 13     Symptoms none (p) none none none    Comments    Home Exercise Given 08/04/15    Duration Progress to 45 minutes of  aerobic exercise without signs/symptoms of physical distress (p) Progress to  45 minutes of aerobic exercise without signs/symptoms of physical distress Progress to 45 minutes of aerobic exercise without signs/symptoms of physical distress Progress to 45 minutes of aerobic exercise without signs/symptoms of physical distress    Intensity Rest + 30 (p) THRR New  104-129 THRR unchanged THRR unchanged    Progression   Progression Continue progressive overload Derrick per policy without signs/symptoms or physical distress. (p) Continue to progress workloads to maintain intensity without signs/symptoms of physical distress. Continue to progress workloads to maintain intensity without signs/symptoms of physical distress. Continue to progress workloads to maintain intensity without signs/symptoms of physical distress.    Average METs  (p) 3.4 2.68 2.68    Resistance Training   Training Prescription Yes (p) Yes Yes Yes    Weight 2 (p) 3 lbs 3 lbs 3 lbs    Reps 10-15 (p) 10-15 10-15 10-15    Interval Training   Interval Training Yes (p) Yes Yes Yes    Equipment NuStep (p) NuStep NuStep NuStep    Comments intervals 2 min by 30 sec. L5/ 60-80 watts, 12-15 RPE (p) intervals 2 min by 30 sec. L5/ 60-80 watts, 12-15 RPE intervals 2 min by 30 sec. L5/ 60-80 watts, 12-15 RPE intervals 2 min by 30 sec. L5/ 60-80 watts, 12-15 RPE    Treadmill   MPH 1.5 (p) 1.5  1.3 for 25 min 1.5 1.5    Grade 0 (p) 0 0 0    Minutes 15 (p) _0 Bike   Level   0.9 0.9    Minutes   15 15    NuStep   Level 5 (p) _1 Watts 80  intervals 60-80 watts (p) 67  did not do intervals since first introduced 60 60    Minutes 15 (p) _2 REL-XR   Level 5 (p) _3 Watts 40 (p) 44 60 60    Minutes 15 (p) _4 Biostep-RELP   Level 3       Watts 25       Minutes 15       Home Exercise Plan   Plans to continue exercise at    Home  Walking and possibly joining [employer]    Add 2 additional days to  program exercise sessions.       Exercise Comments:     Exercise Comments      06/02/15 0616 06/02/15 1017 06/28/15 0945 07/07/15 0921 07/27/15 1457   Exercise Comments Chart Review: Attended medical reiew but has not yet started cardiac rehab classes.  First day of exercise! Derrick Cochran was oriented to the gym and the equipment functions and settings. Procedures and policies of the gym were outlined and explained. The patient's individual exercise prescription and treatment plan were reviewed with him. All starting workloads were established based on the results of the functional testing  done at the initial intake visit. The plan for exercise progression was also introduced and progression will be customized based on the patient's performance and goals.  Talked with Derrick Cochran about his fitness goals. He would like to be able to walk and mow the yard again. He stated that his back pain is what mostly limits him (he stated that he is planning to go backto his chiropractor which has helped him in the past) Derrick well Derrick leg strengh. He has noticed a slight  increase in strengh and energy since starting his exercise program. Interval training was discussed with the patient with regards to benefits and recommendations. See exercise prescription for details. Expected outcome:  back pain can be gotten under control and leg strengh can be increased from regular exercise. Follow up with walking and mowing goal.  Spoke with Derrick Cochran and he stated his legs are feeling Cochran and he can get up out of a chair more easily Derrick Cochran he is increasing his workloads.  He has worked his way up to 15 min on the treadmill.  We will now continue to work with him increase his workloads.     08/04/15 1583 08/09/15 0956         Exercise Comments Reviewed home exercise with pt Cochran.  Pt plans to walking at home for exercise.  Reviewed THR, pulse, RPE, sign and symptoms, and when to call 911 or MD.  Also discussed  weather considerations and indoor options.  They are thinking of joining the [employer] after graduation.  Pt voiced understanding. 6 min walk assessment done Cochran. See 6 min data for details. Patient did increase walk distance from initial assessment and tolerated the assessment with no signs or symptoms.          Discharge Exercise Prescription (Final Exercise Prescription Changes):     Exercise Prescription Changes - 08/04/15 0900    Exercise Review   Progression Yes   Response to Exercise   Symptoms none   Comments Home Exercise Given 08/04/15   Duration Progress to 45 minutes of aerobic exercise without signs/symptoms of physical distress   Intensity THRR unchanged   Progression   Progression Continue to progress workloads to maintain intensity without signs/symptoms of physical distress.   Average METs 2.68   Resistance Training   Training Prescription Yes   Weight 3 lbs   Reps 10-15   Interval Training   Interval Training Yes   Equipment NuStep   Comments intervals 2 min by 30 sec. L5/ 60-80 watts, 12-15 RPE   Treadmill   MPH 1.5   Grade 0   Minutes 15   Bike   Level 0.9   Minutes 15   NuStep   Level 6   Watts 60   Minutes 15   REL-XR   Level 5   Watts 60   Minutes 15   Home Exercise Plan   Plans to continue exercise at Home  Walking and possibly joining [employer] Add 2 additional days to program exercise sessions.      Nutrition:  Target Goals: Understanding of nutrition guidelines, daily intake of sodium <1571m, cholesterol <2055m calories 30% from fat and 7% or less from saturated fats, daily to have 5 or more servings of fruits and vegetables.  Biometrics:     Pre Biometrics - 05/26/15 1210    Pre Biometrics   Waist Circumference 41.5 inches   Hip Circumference 39.2 inches   Waist to Hip Ratio 1.06 %         Post Biometrics - 08/09/15 0958     Post  Biometrics   Height _0  (1.778 m)   Weight 183 lb 1.6 oz (83.054 kg)   Waist  Circumference 41 inches   Hip Circumference 38 inches   Waist to Hip Ratio 1.08 %   BMI (Calculated) 26.3      Nutrition Therapy Plan and Nutrition Goals:     Nutrition Therapy & Goals - 06/22/15  [place] patient and his wife on meal plan based on heart healthy principles and diabetes diabetes guidelines. They have already made many positive diet changes.   Drug/Food Interactions Statins/Certain Fruits   Protein (specify units) 8   Fiber 30 grams   Whole Grain Foods 3 servings   Saturated Fats 13 max. grams   Fruits and Vegetables 5 servings/day   Sodium 2000 grams   Personal Nutrition Goals   Personal Goal #1 Use calorieking.com to look up saturated fat, trans fat and sodium especially when dining out   Personal Goal #2 Read labels for saturated and trans fat and sodium.   Personal Goal #3 Include at least 5 servings of fruits/vegetables daily.   Personal Goal #4 Balance meals with protein, 3-4 servings of carbohydrate and non-starchy vegetables.   Intervention Plan   Intervention Prescribe, educate and counsel regarding individualized specific dietary modifications aiming towards targeted core components such Derrick weight, hypertension, lipid management, diabetes, heart failure and other comorbidities.;Nutrition handout(s) given to patient.   Expected Outcomes Short Term Goal: Understand basic principles of dietary content, such Derrick calories, fat, sodium, cholesterol and nutrients.;Short Term Goal: A plan has been developed with personal nutrition goals set during dietitian appointment.;Long Term Goal: Adherence to prescribed nutrition plan.      Nutrition Discharge: Rate Your Plate Scores:     Nutrition Assessments - 06/22/15 1230    Rate Your Plate Scores   Pre Score 71   Pre Score % 79 %      Nutrition Goals Re-Evaluation:     Nutrition Goals Re-Evaluation      08/11/15 1019           Personal Goal #1 Re-Evaluation   Goal Progress  Seen Yes       Comments Derrick Cochran and his wife are still trying to eat healthy. His wife is very detailed oriented and writes down his blood pressures etc.           Psychosocial: Target Goals: Acknowledge presence or absence of depression, maximize coping skills, provide positive support system. Participant is able to verbalize types and ability to use techniques and skills needed for reducing stress and depression.  Initial Review & Psychosocial Screening:     Initial Psych Review & Screening - 05/26/15 1436    Screening Interventions   Interventions --  Patient denies suicidal ideation.        Quality of Life Scores:     Quality of Life - 06/08/15 1549    Quality of Life Scores   Health/Function Pre 19.2 %   Socioeconomic Pre 30 %   Psych/Spiritual Pre 27.43 %   Family Pre 30 %   GLOBAL Pre 24.71 %      PHQ-9:     Recent Review Flowsheet Data    Depression screen [facility] 2/9 05/26/2015 05/20/2015 04/23/2015   Decreased Interest 3 0 0   Down, Depressed, Hopeless 2 0 0   PHQ - 2 Score 5 0 0   Altered sleeping 3 - -   Tired, decreased energy 3 - -   Change in appetite 2 - -   Feeling bad or failure about yourself  2 - -   Trouble concentrating 0 - -   Moving slowly or fidgety/restless 2 - -   Suicidal thoughts 0 - -   PHQ-9 Score 17 - -   Difficult doing work/chores Somewhat difficult - -      Psychosocial  Evaluation and Intervention:     Psychosocial Evaluation - 06/02/15 1202    Psychosocial Evaluation & Interventions   Interventions Encouraged to exercise with the program and follow exercise prescription   Comments Counselor met with Derrick Cochran Cochran for initial psychosocial evaluation.  He was accompanied by his spouse of 2 years.  Derrick Cochran has a strong support system with his spouse, a brother who lives close by and active involvement in his local church.  He has diabetes Derrick well Derrick CHF.  Derrick Cochran scored intially a "17" on the PHQ-9 which  is moderately severe  for depression.  He and his spouse reported there has been a change of medications in the past 2 weeks which have made a significant difference in his mood and energy levels Derrick well Derrick improved sleep.  Therapist reviewed and revised the PHQ-9 with a new score of "3"  which is a drastic improvement with this being attributed to the medication adjustment and addition of sleep meds.  He denies current symptoms of depression or anxiety and states his mood is positive at this time.  He also reports other than his health issues, he has minimal stress in his life currently. Derrick Cochran has goals for this program to increase his energy, stamina and to breathe better.  He loves to walk so will plan to follow this program with [facility]      Psychosocial Re-Evaluation:     Psychosocial Re-Evaluation      06/09/15 1019 06/16/15 1037 06/21/15 0950 06/30/15 1001 08/04/15 1003   Psychosocial Re-Evaluation   Interventions  Encouraged to attend Cardiac Rehabilitation for the exercise      Comments Counselor follow up with Derrick Cochran Cochran reorting he is already feeling Cochran since beginning this program.  He also is able to walk better and reorts his mood continues to be positive.  He also mentioned he may have forgotten one of his medications yesterday and did not feel Derrick well all day.  Counselor encouraged the daily pill boxes to remember and be better organized concerning taking his medications daily.   Derrick Cochran left a vm that Derrick Cochran was sorry that he was sick and would be unable to attend Cardiac Rehab Cochran.  Derrick Cochran said CR has helped his leg strength so he is glad for that. His wife is very supportive.  Counselor follow up with Derrick Cochran exclaiming "I did 30 minutes on this machine Cochran!"  Counselor commended Derrick Cochran on his hard work and commitment to exercise.  He also reports noting his core strength is better Derrick he is able to get up and down with greater ease.  His  mood continues to be positive and he states he is taking his medications daily and more consistently than previously.   Derrick Cochran continues to report positive results from this program with more strength and participating in normal activities more easily.  He particularly notes his ability to get up and down from the bed is significantly better than before and he looks forward to coming to class early in the mornings.  Counselor commended him on his progress made.       08/11/15 1021           Psychosocial Re-Evaluation   Interventions Relaxation education       Comments Derrick Cochran was in the stress education class Cochran in which relaxation techniques were taught. Reinforced at the end of Cardiac Rehab like usual.  Vocational Rehabilitation: Provide vocational rehab assistance to qualifying candidates.   Vocational Rehab Evaluation & Intervention:     Vocational Rehab - 05/26/15 1255    Initial Vocational Rehab Evaluation & Intervention   Assessment shows need for Vocational Rehabilitation No      Education: Education Goals: Education classes will be provided on a weekly basis, covering required topics. Participant will state understanding/return demonstration of topics presented.  Learning Barriers/Preferences:     Learning Barriers/Preferences - 05/26/15 1254    Learning Barriers/Preferences   Learning Barriers Hearing;Exercise Concerns   Learning Preferences None      Education Topics: General Nutrition Guidelines/Fats and Fiber: -Group instruction provided by verbal, written material, models and posters to present the general guidelines for heart healthy nutrition. Gives an explanation and review of dietary fats and fiber.          Cardiac Rehab from 08/11/2015 in [facility] Cardiac and Pulmonary Rehab   Date  07/19/15   Educator  Jaclyn Shaggy   Instruction Review Code  2- meets goals/outcomes      Controlling Sodium/Reading Food Labels: -Group verbal and written  material supporting the discussion of sodium use in heart healthy nutrition. Review and explanation with models, verbal and written materials for utilization of the food label.   Exercise Physiology & Risk Factors: - Group verbal and written instruction with models to review the exercise physiology of the cardiovascular system and associated critical values. Details cardiovascular disease risk factors and the goals associated with each risk factor.      Cardiac Rehab from 08/11/2015 in [facility] Cardiac and Pulmonary Rehab   Date  06/02/15   Educator  BS   Instruction Review Code  2- meets goals/outcomes      Aerobic Exercise & Resistance Training: - Gives group verbal and written discussion on the health impact of inactivity. On the components of aerobic and resistive training programs and the benefits of this training and how to safely progress through these programs.      Cardiac Rehab from 08/11/2015 in [facility] Cardiac and Pulmonary Rehab   Date  08/02/15   Educator  [facility]   Instruction Review Code  R- Review/reinforce [Second Class]      Flexibility, Balance, General Exercise Guidelines: - Provides group verbal and written instruction on the benefits of flexibility and balance training programs. Provides general exercise guidelines with specific guidelines to those with heart or lung disease. Demonstration and skill practice provided.      Cardiac Rehab from 08/11/2015 in [facility] Cardiac and Pulmonary Rehab   Date  08/04/15   Educator  Derrick   Instruction Review Code  R- Review/reinforce [Second Class]      Stress Management: - Provides group verbal and written instruction about the health risks of elevated stress, cause of high stress, and healthy ways to reduce stress.      Cardiac Rehab from 08/11/2015 in [facility] Cardiac and Pulmonary Rehab   Date  08/11/15   Educator  [facility]   Instruction Review Code  2- meets goals/outcomes      Depression: - Provides group verbal and written instruction on the  correlation between heart/lung disease and depressed mood, treatment options, and the stigmas associated with seeking treatment.      Cardiac Rehab from 08/11/2015 in [facility] Cardiac and Pulmonary Rehab   Date  07/14/15   Educator  [facility]   Instruction Review Code  2- meets goals/outcomes      Anatomy & Physiology of the Heart: - Group verbal and  written instruction and models provide basic cardiac anatomy and physiology, with the coronary electrical and arterial systems. Review of: AMI, Angina, Valve disease, Heart Failure, Cardiac Arrhythmia, Pacemakers, and the ICD.      Cardiac Rehab from 08/11/2015 in [facility] Cardiac and Pulmonary Rehab   Date  08/09/15   Educator  SB2   Instruction Review Code  2- meets goals/outcomes      Cardiac Procedures: - Group verbal and written instruction and models to describe the testing methods done to diagnose heart disease. Reviews the outcomes of the test results. Describes the treatment choices: Medical Management, Angioplasty, or Coronary Bypass Surgery.      Cardiac Rehab from 08/11/2015 in [facility] Cardiac and Pulmonary Rehab   Date  06/21/15   Educator  CE   Instruction Review Code  2- meets goals/outcomes      Cardiac Medications: - Group verbal and written instruction to review commonly prescribed medications for heart disease. Reviews the medication, class of the drug, and side effects. Includes the steps to properly store meds and maintain the prescription regimen.      Cardiac Rehab from 08/11/2015 in [facility] Cardiac and Pulmonary Rehab   Date  06/30/15   Educator  SB   Instruction Review Code  2- meets goals/outcomes [Part II:  Cardiac Medications]      Go Sex-Intimacy & Heart Disease, Get SMART - Goal Setting: - Group verbal and written instruction through game format to discuss heart disease and the return to sexual intimacy. Provides group verbal and written material to discuss and apply goal setting through the application of the S.M.A.R.T.  Method.      Cardiac Rehab from 08/11/2015 in [facility] Cardiac and Pulmonary Rehab   Date  06/21/15   Educator  CE   Instruction Review Code  2- meets goals/outcomes      Other Matters of the Heart: - Provides group verbal, written materials and models to describe Heart Failure, Angina, Valve Disease, and Diabetes in the realm of heart disease. Includes description of the disease process and treatment options available to the cardiac patient.      Cardiac Rehab from 08/11/2015 in [facility] Cardiac and Pulmonary Rehab   Date  08/09/15   Educator  sb   Instruction Review Code  2- meets goals/outcomes      Exercise & Equipment Safety: - Individual verbal instruction and demonstration of equipment use and safety with use of the equipment.      Cardiac Rehab from 08/11/2015 in [facility] Cardiac and Pulmonary Rehab   Date  05/26/15   Educator  D. Joya Gaskins, RN   Instruction Review Code  1-

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
## 2015-08-11 NOTE — Progress Notes (Signed)
Cardiac Individual Treatment Plan  Patient Details  Name: Derrick Cochran MRN: [id number] Date of Birth: May 19, 1935 Referring Provider:        Cardiac Rehab from 07/21/2015 in [facility] Cardiac and Pulmonary Rehab   Referring Provider  Fulton Reek MD      Initial Encounter Date:       Cardiac Rehab from 07/21/2015 in [facility] Cardiac and Pulmonary Rehab   Referring Provider  Fulton Reek MD      Visit Diagnosis: Chronic systolic congestive heart failure ([place])  Patient's Home Medications on Admission:  Current outpatient prescriptions:  .  acetaminophen (TYLENOL) 500 MG tablet, Take 500 mg by mouth every 6 (six) hours as needed for mild pain, moderate pain or fever. , Disp: , Rfl:  .  aspirin EC 81 MG EC tablet, Take 1 tablet (81 mg total) by mouth daily., Disp: , Rfl:  .  cyanocobalamin 1000 MCG tablet, Take 1,000 mcg by mouth daily., Disp: , Rfl:  .  enalapril (VASOTEC) 5 MG tablet, Take 5 mg by mouth 2 (two) times daily. , Disp: , Rfl:  .  furosemide (LASIX) 20 MG tablet, Take 20 mg by mouth every morning. , Disp: , Rfl:  .  gabapentin (NEURONTIN) 300 MG capsule, Take 300 mg by mouth daily. , Disp: , Rfl:  .  glipiZIDE (GLUCOTROL XL) 5 MG 24 hr tablet, Take 5 mg by mouth every morning., Disp: , Rfl:  .  levothyroxine (SYNTHROID, LEVOTHROID) 137 MCG tablet, Take 137 mcg by mouth daily before breakfast. , Disp: , Rfl:  .  loratadine (CLARITIN) 10 MG tablet, Take 1 tablet by mouth daily., Disp: , Rfl:  .  LORazepam (ATIVAN) 1 MG tablet, Take 2 mg by mouth at bedtime., Disp: , Rfl:  .  metFORMIN (GLUCOPHAGE) 500 MG tablet, Take 500 mg by mouth 2 (two) times daily. , Disp: , Rfl:  .  metoprolol succinate (TOPROL-XL) 25 MG 24 hr tablet, Take 25 mg by mouth daily., Disp: , Rfl:  .  nitroGLYCERIN (NITROSTAT) 0.4 MG SL tablet, Place 0.4 mg under the tongue every 5 (five) minutes as needed for chest pain., Disp: , Rfl:  .  omeprazole (PRILOSEC) 20 MG capsule, Take 20 mg by mouth every morning. ,  Disp: , Rfl:  .  simvastatin (ZOCOR) 40 MG tablet, Take 40 mg by mouth every evening. , Disp: , Rfl:  .  sotalol (BETAPACE) 80 MG tablet, Take 1 tablet (80 mg total) by mouth 2 (two) times daily., Disp: 60 tablet, Rfl: 5 .  tamsulosin (FLOMAX) 0.4 MG CAPS capsule, Take 0.4 mg by mouth every morning. , Disp: , Rfl:  .  temazepam (RESTORIL) 15 MG capsule, Take 15 mg by mouth at bedtime. , Disp: , Rfl:  .  warfarin (COUMADIN) 1 MG tablet, Take 1 mg by mouth every evening. Pt takes with 60m tablet for a total dose of 424m, Disp: , Rfl:  .  warfarin (COUMADIN) 3 MG tablet, Take 3 mg by mouth every evening. Pt takes with 71m471mablet for a total dose of 4mg93mDisp: , Rfl:   Past Medical History: Past Medical History  Diagnosis Date  . Coronary artery disease   . Hypertension   . Myocardial infarction (HCC)[place]. Hypothyroidism   . Diabetes mellitus without complication (HCC)[place]. GERD (gastroesophageal reflux disease)   . Arthritis   . Heart murmur   . Shortness of breath dyspnea   . Cancer (HCC)[place]  skin  . CHF (congestive heart failure) ([place])   . Arrhythmia     atrial fibrillation  . AAA (abdominal aortic aneurysm) ([place])   . Esophageal stricture   . BPH (benign prostatic hyperplasia)   . DDD (degenerative disc disease), thoracic   . Hyperlipidemia     Tobacco Use: History  Smoking status  . Former Smoker -- 0.50 packs/day for 15 years  . Types: Cigarettes  . Quit date: 04/22/1985  Smokeless tobacco  . Former Systems developer  . Types: Chew    Labs: Recent Review Flowsheet Data    There is no flowsheet data to display.       Exercise Target Goals:    Exercise Program Goal: Individual exercise prescription set with THRR, safety & activity barriers. Participant demonstrates ability to understand and report RPE using BORG scale, to self-measure pulse accurately, and to acknowledge the importance of the exercise prescription.  Exercise Prescription Goal: Starting with aerobic activity  30 plus minutes a day, 3 days per week for initial exercise prescription. Provide home exercise prescription and guidelines that participant acknowledges understanding prior to discharge.  Activity Barriers & Risk Stratification:     Activity Barriers & Cardiac Risk Stratification - 05/26/15 1253    Activity Barriers & Cardiac Risk Stratification   Activity Barriers Arthritis;Back Problems;Shortness of Breath   Cardiac Risk Stratification High      6 Minute Walk:     6 Minute Walk      05/26/15 1209 08/09/15 0954     6 Minute Walk   Phase Initial Discharge    Distance 1075 feet 1233 feet    Distance % Change  0.15 %    Walk Time 6 minutes 6 minutes    # of Rest Breaks  0    RPE 11 13    Perceived Dyspnea  3     Symptoms Yes (comment)  "just my legs and hips feel weak" No    Resting HR 78 bpm 80 bpm    Resting BP 110/70 mmHg 108/57 mmHg    Max Ex. HR 115 bpm 102 bpm    Max Ex. BP 102/60 mmHg 110/52 mmHg       Initial Exercise Prescription:     Initial Exercise Prescription - 07/27/15 1400    Date of Initial Exercise RX and Referring Provider   Referring Provider Fulton Reek MD      Perform Capillary Blood Glucose checks as needed.  Exercise Prescription Changes:     Exercise Prescription Changes      06/28/15 0900 07/13/15 1300 07/27/15 1400 08/04/15 0900     Exercise Review   Progression Yes (p) Yes Yes Yes    Response to Exercise   Blood Pressure (Admit) 110/64 mmHg (p) 126/52 mmHg 104/64 mmHg     Blood Pressure (Exercise) 126/72 mmHg (p) 140/80 mmHg 162/70 mmHg     Blood Pressure (Exit) 112/82 mmHg (p) 108/52 mmHg 92/60 mmHg     Heart Rate (Admit) 97 bpm (p) 77 bpm 80 bpm     Heart Rate (Exercise) 100 bpm (p) 98 bpm 102 bpm     Heart Rate (Exit) 82 bpm (p) 84 bpm 87 bpm     Rating of Perceived Exertion (Exercise) 13 (p) 13 13     Symptoms none (p) none none none    Comments    Home Exercise Given 08/04/15    Duration Progress to 45 minutes of  aerobic exercise without signs/symptoms of physical distress (p) Progress to  45 minutes of aerobic exercise without signs/symptoms of physical distress Progress to 45 minutes of aerobic exercise without signs/symptoms of physical distress Progress to 45 minutes of aerobic exercise without signs/symptoms of physical distress    Intensity Rest + 30 (p) THRR New  104-129 THRR unchanged THRR unchanged    Progression   Progression Continue progressive overload as per policy without signs/symptoms or physical distress. (p) Continue to progress workloads to maintain intensity without signs/symptoms of physical distress. Continue to progress workloads to maintain intensity without signs/symptoms of physical distress. Continue to progress workloads to maintain intensity without signs/symptoms of physical distress.    Average METs  (p) 3.4 2.68 2.68    Resistance Training   Training Prescription Yes (p) Yes Yes Yes    Weight 2 (p) 3 lbs 3 lbs 3 lbs    Reps 10-15 (p) 10-15 10-15 10-15    Interval Training   Interval Training Yes (p) Yes Yes Yes    Equipment NuStep (p) NuStep NuStep NuStep    Comments intervals 2 min by 30 sec. L5/ 60-80 watts, 12-15 RPE (p) intervals 2 min by 30 sec. L5/ 60-80 watts, 12-15 RPE intervals 2 min by 30 sec. L5/ 60-80 watts, 12-15 RPE intervals 2 min by 30 sec. L5/ 60-80 watts, 12-15 RPE    Treadmill   MPH 1.5 (p) 1.5  1.3 for 25 min 1.5 1.5    Grade 0 (p) 0 0 0    Minutes 15 (p) _0 Bike   Level   0.9 0.9    Minutes   15 15    NuStep   Level 5 (p) _1 Watts 80  intervals 60-80 watts (p) 67  did not do intervals since first introduced 60 60    Minutes 15 (p) _2 REL-XR   Level 5 (p) _3 Watts 40 (p) 44 60 60    Minutes 15 (p) _4 Biostep-RELP   Level 3       Watts 25       Minutes 15       Home Exercise Plan   Plans to continue exercise at    Home  Walking and possibly joining [employer]    Add 2 additional days to  program exercise sessions.       Exercise Comments:     Exercise Comments      06/02/15 0616 06/02/15 1017 06/28/15 0945 07/07/15 0921 07/27/15 1457   Exercise Comments Chart Review: Attended medical reiew but has not yet started cardiac rehab classes.  First day of exercise! Derrick Cochran was oriented to the gym and the equipment functions and settings. Procedures and policies of the gym were outlined and explained. The patient's individual exercise prescription and treatment plan were reviewed with him. All starting workloads were established based on the results of the functional testing  done at the initial intake visit. The plan for exercise progression was also introduced and progression will be customized based on the patient's performance and goals.  Talked with Derrick Cochran about his fitness goals. He would like to be able to walk and mow the yard again. He stated that his back pain is what mostly limits him (he stated that he is planning to go backto his chiropractor which has helped him in the past) as well as leg strengh. He has noticed a slight  increase in strengh and energy since starting his exercise program. Interval training was discussed with the patient with regards to benefits and recommendations. See exercise prescription for details. Expected outcome:  back pain can be gotten under control and leg strengh can be increased from regular exercise. Follow up with walking and mowing goal.  Spoke with Derrick Cochran today and he stated his legs are feeling stronger and he can get up out of a chair more easily As Derrick Cochran is getting stronger he is increasing his workloads.  He has worked his way up to 15 min on the treadmill.  We will now continue to work with him increase his workloads.     08/04/15 1583 08/09/15 0956         Exercise Comments Reviewed home exercise with pt today.  Pt plans to walking at home for exercise.  Reviewed THR, pulse, RPE, sign and symptoms, and when to call 911 or MD.  Also discussed  weather considerations and indoor options.  They are thinking of joining the [employer] after graduation.  Pt voiced understanding. 6 min walk assessment done today. See 6 min data for details. Patient did increase walk distance from initial assessment and tolerated the assessment with no signs or symptoms.          Discharge Exercise Prescription (Final Exercise Prescription Changes):     Exercise Prescription Changes - 08/04/15 0900    Exercise Review   Progression Yes   Response to Exercise   Symptoms none   Comments Home Exercise Given 08/04/15   Duration Progress to 45 minutes of aerobic exercise without signs/symptoms of physical distress   Intensity THRR unchanged   Progression   Progression Continue to progress workloads to maintain intensity without signs/symptoms of physical distress.   Average METs 2.68   Resistance Training   Training Prescription Yes   Weight 3 lbs   Reps 10-15   Interval Training   Interval Training Yes   Equipment NuStep   Comments intervals 2 min by 30 sec. L5/ 60-80 watts, 12-15 RPE   Treadmill   MPH 1.5   Grade 0   Minutes 15   Bike   Level 0.9   Minutes 15   NuStep   Level 6   Watts 60   Minutes 15   REL-XR   Level 5   Watts 60   Minutes 15   Home Exercise Plan   Plans to continue exercise at Home  Walking and possibly joining [employer] Add 2 additional days to program exercise sessions.      Nutrition:  Target Goals: Understanding of nutrition guidelines, daily intake of sodium <1571m, cholesterol <2055m calories 30% from fat and 7% or less from saturated fats, daily to have 5 or more servings of fruits and vegetables.  Biometrics:     Pre Biometrics - 05/26/15 1210    Pre Biometrics   Waist Circumference 41.5 inches   Hip Circumference 39.2 inches   Waist to Hip Ratio 1.06 %         Post Biometrics - 08/09/15 0958     Post  Biometrics   Height _0  (1.778 m)   Weight 183 lb 1.6 oz (83.054 kg)   Waist  Circumference 41 inches   Hip Circumference 38 inches   Waist to Hip Ratio 1.08 %   BMI (Calculated) 26.3      Nutrition Therapy Plan and Nutrition Goals:     Nutrition Therapy & Goals - 06/22/15  [place] patient and his wife on meal plan based on heart healthy principles and diabetes diabetes guidelines. They have already made many positive diet changes.   Drug/Food Interactions Statins/Certain Fruits   Protein (specify units) 8   Fiber 30 grams   Whole Grain Foods 3 servings   Saturated Fats 13 max. grams   Fruits and Vegetables 5 servings/day   Sodium 2000 grams   Personal Nutrition Goals   Personal Goal #1 Use calorieking.com to look up saturated fat, trans fat and sodium especially when dining out   Personal Goal #2 Read labels for saturated and trans fat and sodium.   Personal Goal #3 Include at least 5 servings of fruits/vegetables daily.   Personal Goal #4 Balance meals with protein, 3-4 servings of carbohydrate and non-starchy vegetables.   Intervention Plan   Intervention Prescribe, educate and counsel regarding individualized specific dietary modifications aiming towards targeted core components such as weight, hypertension, lipid management, diabetes, heart failure and other comorbidities.;Nutrition handout(s) given to patient.   Expected Outcomes Short Term Goal: Understand basic principles of dietary content, such as calories, fat, sodium, cholesterol and nutrients.;Short Term Goal: A plan has been developed with personal nutrition goals set during dietitian appointment.;Long Term Goal: Adherence to prescribed nutrition plan.      Nutrition Discharge: Rate Your Plate Scores:     Nutrition Assessments - 06/22/15 1230    Rate Your Plate Scores   Pre Score 71   Pre Score % 79 %      Nutrition Goals Re-Evaluation:     Nutrition Goals Re-Evaluation      08/11/15 1019           Personal Goal #1 Re-Evaluation   Goal Progress  Seen Yes       Comments Derrick Cochran and his wife are still trying to eat healthy. His wife is very detailed oriented and writes down his blood pressures etc.           Psychosocial: Target Goals: Acknowledge presence or absence of depression, maximize coping skills, provide positive support system. Participant is able to verbalize types and ability to use techniques and skills needed for reducing stress and depression.  Initial Review & Psychosocial Screening:     Initial Psych Review & Screening - 05/26/15 1436    Screening Interventions   Interventions --  Patient denies suicidal ideation.        Quality of Life Scores:     Quality of Life - 06/08/15 1549    Quality of Life Scores   Health/Function Pre 19.2 %   Socioeconomic Pre 30 %   Psych/Spiritual Pre 27.43 %   Family Pre 30 %   GLOBAL Pre 24.71 %      PHQ-9:     Recent Review Flowsheet Data    Depression screen [facility] 2/9 05/26/2015 05/20/2015 04/23/2015   Decreased Interest 3 0 0   Down, Depressed, Hopeless 2 0 0   PHQ - 2 Score 5 0 0   Altered sleeping 3 - -   Tired, decreased energy 3 - -   Change in appetite 2 - -   Feeling bad or failure about yourself  2 - -   Trouble concentrating 0 - -   Moving slowly or fidgety/restless 2 - -   Suicidal thoughts 0 - -   PHQ-9 Score 17 - -   Difficult doing work/chores Somewhat difficult - -      Psychosocial  Evaluation and Intervention:     Psychosocial Evaluation - 06/02/15 1202    Psychosocial Evaluation & Interventions   Interventions Encouraged to exercise with the program and follow exercise prescription   Comments Counselor met with Mr. Georg today for initial psychosocial evaluation.  He was accompanied by his spouse of 2 years.  Mr. Treese has a strong support system with his spouse, a brother who lives close by and active involvement in his local church.  He has diabetes as well as CHF.  Mr. Corpening scored intially a "17" on the PHQ-9 which  is moderately severe  for depression.  He and his spouse reported there has been a change of medications in the past 2 weeks which have made a significant difference in his mood and energy levels as well as improved sleep.  Therapist reviewed and revised the PHQ-9 with a new score of "3"  which is a drastic improvement with this being attributed to the medication adjustment and addition of sleep meds.  He denies current symptoms of depression or anxiety and states his mood is positive at this time.  He also reports other than his health issues, he has minimal stress in his life currently. Mr. Sebring has goals for this program to increase his energy, stamina and to breathe better.  He loves to walk so will plan to follow this program [facility]      Psychosocial Re-Evaluation:     Psychosocial Re-Evaluation      06/09/15 1019 06/16/15 1037 06/21/15 0950 06/30/15 1001 08/04/15 1003   Psychosocial Re-Evaluation   Interventions  Encouraged to attend Cardiac Rehabilitation for the exercise      Comments Counselor follow up with Derrick Cochran today reorting he is already feeling stronger since beginning this program.  He also is able to walk better and reorts his mood continues to be positive.  He also mentioned he may have forgotten one of his medications yesterday and did not feel as well all day.  Counselor encouraged the daily pill boxes to remember and be better organized concerning taking his medications daily.   Mrs. Degante left a vm that Derrick Cochran was sorry that he was sick and would be unable to attend Cardiac Rehab today.  Derrick Cochran said CR has helped his leg strength so he is glad for that. His wife is very supportive.  Counselor follow up with Derrick Cochran exclaiming "I did 30 minutes on this machine today!"  Counselor commended Derrick Cochran on his hard work and commitment to exercise.  He also reports noting his core strength is better as he is able to get up and down with greater ease.  His  mood continues to be positive and he states he is taking his medications daily and more consistently than previously.   Derrick Cochran continues to report positive results from this program with more strength and participating in normal activities more easily.  He particularly notes his ability to get up and down from the bed is significantly better than before and he looks forward to coming to class early in the mornings.  Counselor commended him on his progress made.       08/11/15 1021           Psychosocial Re-Evaluation   Interventions Relaxation education       Comments Derrick Cochran was in the stress education class today in which relaxation techniques were taught. Reinforced at the end of Cardiac Rehab like usual.  Vocational Rehabilitation: Provide vocational rehab assistance to qualifying candidates.   Vocational Rehab Evaluation & Intervention:     Vocational Rehab - 05/26/15 1255    Initial Vocational Rehab Evaluation & Intervention   Assessment shows need for Vocational Rehabilitation No      Education: Education Goals: Education classes will be provided on a weekly basis, covering required topics. Participant will state understanding/return demonstration of topics presented.  Learning Barriers/Preferences:     Learning Barriers/Preferences - 05/26/15 1254    Learning Barriers/Preferences   Learning Barriers Hearing;Exercise Concerns   Learning Preferences None      Education Topics: General Nutrition Guidelines/Fats and Fiber: -Group instruction provided by verbal, written material, models and posters to present the general guidelines for heart healthy nutrition. Gives an explanation and review of dietary fats and fiber.          Cardiac Rehab from 08/11/2015 in [facility] Cardiac and Pulmonary Rehab   Date  07/19/15   Educator  Jaclyn Shaggy   Instruction Review Code  2- meets goals/outcomes      Controlling Sodium/Reading Food Labels: -Group verbal and written  material supporting the discussion of sodium use in heart healthy nutrition. Review and explanation with models, verbal and written materials for utilization of the food label.   Exercise Physiology & Risk Factors: - Group verbal and written instruction with models to review the exercise physiology of the cardiovascular system and associated critical values. Details cardiovascular disease risk factors and the goals associated with each risk factor.      Cardiac Rehab from 08/11/2015 in [facility] Cardiac and Pulmonary Rehab   Date  06/02/15   Educator  BS   Instruction Review Code  2- meets goals/outcomes      Aerobic Exercise & Resistance Training: - Gives group verbal and written discussion on the health impact of inactivity. On the components of aerobic and resistive training programs and the benefits of this training and how to safely progress through these programs.      Cardiac Rehab from 08/11/2015 in [facility] Cardiac and Pulmonary Rehab   Date  08/02/15   Educator  [facility]   Instruction Review Code  R- Review/reinforce [Second Class]      Flexibility, Balance, General Exercise Guidelines: - Provides group verbal and written instruction on the benefits of flexibility and balance training programs. Provides general exercise guidelines with specific guidelines to those with heart or lung disease. Demonstration and skill practice provided.      Cardiac Rehab from 08/11/2015 in [facility] Cardiac and Pulmonary Rehab   Date  08/04/15   Educator  AS   Instruction Review Code  R- Review/reinforce [Second Class]      Stress Management: - Provides group verbal and written instruction about the health risks of elevated stress, cause of high stress, and healthy ways to reduce stress.      Cardiac Rehab from 08/11/2015 in [facility] Cardiac and Pulmonary Rehab   Date  08/11/15   Educator  [facility]   Instruction Review Code  2- meets goals/outcomes      Depression: - Provides group verbal and written instruction on the  correlation between heart/lung disease and depressed mood, treatment options, and the stigmas associated with seeking treatment.      Cardiac Rehab from 08/11/2015 in [facility] Cardiac and Pulmonary Rehab   Date  07/14/15   Educator  [facility]   Instruction Review Code  2- meets goals/outcomes      Anatomy & Physiology of the Heart: - Group verbal and  written instruction and models provide basic cardiac anatomy and physiology, with the coronary electrical and arterial systems. Review of: AMI, Angina, Valve disease, Heart Failure, Cardiac Arrhythmia, Pacemakers, and the ICD.      Cardiac Rehab from 08/11/2015 in [facility] Cardiac and Pulmonary Rehab   Date  08/09/15   Educator  SB2   Instruction Review Code  2- meets goals/outcomes      Cardiac Procedures: - Group verbal and written instruction and models to describe the testing methods done to diagnose heart disease. Reviews the outcomes of the test results. Describes the treatment choices: Medical Management, Angioplasty, or Coronary Bypass Surgery.      Cardiac Rehab from 08/11/2015 in [facility] Cardiac and Pulmonary Rehab   Date  06/21/15   Educator  CE   Instruction Review Code  2- meets goals/outcomes      Cardiac Medications: - Group verbal and written instruction to review commonly prescribed medications for heart disease. Reviews the medication, class of the drug, and side effects. Includes the steps to properly store meds and maintain the prescription regimen.      Cardiac Rehab from 08/11/2015 in [facility] Cardiac and Pulmonary Rehab   Date  06/30/15   Educator  SB   Instruction Review Code  2- meets goals/outcomes [Part II:  Cardiac Medications]      Go Sex-Intimacy & Heart Disease, Get SMART - Goal Setting: - Group verbal and written instruction through game format to discuss heart disease and the return to sexual intimacy. Provides group verbal and written material to discuss and apply goal setting through the application of the S.M.A.R.T.  Method.      Cardiac Rehab from 08/11/2015 in [facility] Cardiac and Pulmonary Rehab   Date  06/21/15   Educator  CE   Instruction Review Code  2- meets goals/outcomes      Other Matters of the Heart: - Provides group verbal, written materials and models to describe Heart Failure, Angina, Valve Disease, and Diabetes in the realm of heart disease. Includes description of the disease process and treatment options available to the cardiac patient.      Cardiac Rehab from 08/11/2015 in [facility] Cardiac and Pulmonary Rehab   Date  08/09/15   Educator  sb   Instruction Review Code  2- meets goals/outcomes      Exercise & Equipment Safety: - Individual verbal instruction and demonstration of equipment use and safety with use of the equipment.      Cardiac Rehab from 08/11/2015 in [facility] Cardiac and Pulmonary Rehab   Date  05/26/15   Educator  D. Joya Gaskins, RN   Instruction Review Code  1- partially meets, needs review/practice      Infection Prevention: - Provides verbal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

## 2015-08-11 NOTE — Patient Instructions (Signed)
Discharge Instructions  Patient Details  Name: Derrick Cochran MRN: [id number] Date of Birth: 03-May-1935 Referring Provider:  [name], MD   Number of Visits:   Reason for Discharge:  Patient reached a stable level of exercise. Patient independent in their exercise.  Smoking History:  History  Smoking status  . Former Smoker -- 0.50 packs/day for 15 years  . Types: Cigarettes  . Quit date: 04/22/1985  Smokeless tobacco  . Former Systems developer  . Types: Chew    Diagnosis:  Chronic systolic congestive heart failure [facility])  Initial Exercise Prescription:     Initial Exercise Prescription - 07/27/15 1400    Date of Initial Exercise RX and Referring Provider   Referring Provider Fulton Reek MD      Discharge Exercise Prescription (Final Exercise Prescription Changes):     Exercise Prescription Changes - 08/04/15 0900    Exercise Review   Progression Yes   Response to Exercise   Symptoms none   Comments Home Exercise Given 08/04/15   Duration Progress to 45 minutes of aerobic exercise without signs/symptoms of physical distress   Intensity THRR unchanged   Progression   Progression Continue to progress workloads to maintain intensity without signs/symptoms of physical distress.   Average METs 2.68   Resistance Training   Training Prescription Yes   Weight 3 lbs   Reps 10-15   Interval Training   Interval Training Yes   Equipment NuStep   Comments intervals 2 min by 30 sec. L5/ 60-80 watts, 12-15 RPE   Treadmill   MPH 1.5   Grade 0   Minutes 15   Bike   Level 0.9   Minutes 15   NuStep   Level 6   Watts 60   Minutes 15   REL-XR   Level 5   Watts 60   Minutes 15   Home Exercise Plan   Plans to continue exercise at Home  Walking and possibly joining [employer] Add 2 additional days to program exercise sessions.      Functional Capacity:     6 Minute Walk      05/26/15 1209 08/09/15 0954     6 Minute Walk   Phase Initial  Discharge    Distance 1075 feet 1233 feet    Distance % Change  0.15 %    Walk Time 6 minutes 6 minutes    # of Rest Breaks  0    RPE 11 13    Perceived Dyspnea  3     Symptoms Yes (comment)  "just my legs and hips feel weak" No    Resting HR 78 bpm 80 bpm    Resting BP 110/70 mmHg 108/57 mmHg    Max Ex. HR 115 bpm 102 bpm    Max Ex. BP 102/60 mmHg 110/52 mmHg       Quality of Life:     Quality of Life - 06/08/15 1549    Quality of Life Scores   Health/Function Pre 19.2 %   Socioeconomic Pre 30 %   Psych/Spiritual Pre 27.43 %   Family Pre 30 %   GLOBAL Pre 24.71 %      Personal Goals: Goals established at orientation with interventions provided to work toward goal.     Personal Goals and Risk Factors at Admission - 05/26/15 1259    Core Components/Risk Factors/Patient Goals on Admission    Weight Management Yes   Intervention Weight Management: Develop a combined nutrition and  exercise program designed to reach desired caloric intake, while maintaining appropriate intake of nutrient and fiber, sodium and fats, and appropriate energy expenditure required for the weight goal.;Weight Management: Provide education and appropriate resources to help participant work on and attain dietary goals.;Weight Management/Obesity: Establish reasonable short term and long term weight goals.   Admit Weight 183 lb 4.8 oz (83.144 kg)   Goal Weight: Short Term 183 lb 4.8 oz (83.144 kg)   Goal Weight: Long Term 183 lb 4.8 oz (83.144 kg)   Expected Outcomes Short Term: Continue to assess and modify interventions until short term weight is achieved;Long Term: Adherence to nutrition and physical activity/exercise program aimed toward attainment of established weight goal;Weight Maintenance: Understanding of the daily nutrition guidelines, which includes 25-35% calories from fat, 7% or less cal from saturated fats, less than 200mg  cholesterol, less than 1.5gm of sodium, & 5 or more servings of fruits  and vegetables daily;Understanding recommendations for meals to include 15-35% energy as protein, 25-35% energy from fat, 35-60% energy from carbohydrates, less than 200mg  of dietary cholesterol, 20-35 gm of total fiber daily;Understanding of distribution of calorie intake throughout the day with the consumption of 4-5 meals/snacks   Sedentary Yes   Intervention Provide advice, education, support and counseling about physical activity/exercise needs.;Develop an individualized exercise prescription for aerobic and resistive training based on initial evaluation findings, risk stratification, comorbidities and participant's personal goals.   Expected Outcomes Achievement of increased cardiorespiratory fitness and enhanced flexibility, muscular endurance and strength shown through measurements of functional capacity and personal statement of participant.   Increase Strength and Stamina Yes   Intervention Develop an individualized exercise prescription for aerobic and resistive training based on initial evaluation findings, risk stratification, comorbidities and participant's personal goals.;Provide advice, education, support and counseling about physical activity/exercise needs.   Expected Outcomes Achievement of increased cardiorespiratory fitness and enhanced flexibility, muscular endurance and strength shown through measurements of functional capacity and personal statement of participant.   Improve shortness of breath with ADL's Yes   Intervention Provide education, individualized exercise plan and daily activity instruction to help decrease symptoms of SOB with activities of daily living.   Expected Outcomes Short Term: Achieves a reduction of symptoms when performing activities of daily living.   Diabetes Yes   Intervention Provide education about signs/symptoms and action to take for hypo/hyperglycemia.;Provide education about proper nutrition, including hydration, and aerobic/resistive exercise  prescription along with prescribed medications to achieve blood glucose in normal ranges: Fasting glucose 65-99 mg/dL   Expected Outcomes Short Term: Participant verbalizes understanding of the signs/symptoms and immediate care of hyper/hypoglycemia, proper foot care and importance of medication, aerobic/resistive exercise and nutrition plan for blood glucose control.;Long Term: Attainment of HbA1C < 7%.   Heart Failure Yes   Intervention Provide a combined exercise and nutrition program that is supplemented with education, support and counseling about heart failure. Directed toward relieving symptoms such as shortness of breath, decreased exercise tolerance, and extremity edema.   Expected Outcomes Improve functional capacity of life;Short term: Attendance in program 2-3 days a week with increased exercise capacity. Reported lower sodium intake. Reported increased fruit and vegetable intake. Reports medication compliance.;Short term: Daily weights obtained and reported for increase. Utilizing diuretic protocols set by physician.;Long term: Adoption of self-care skills and reduction of barriers for early signs and symptoms recognition and intervention leading to self-care maintenance.   Hypertension Yes   Intervention Provide education on lifestyle modifcations including regular physical activity/exercise, weight management, moderate sodium restriction and increased  consumption of fresh fruit, vegetables, and low fat dairy, alcohol moderation, and smoking cessation.;Monitor prescription use compliance.   Expected Outcomes Short Term: Continued assessment and intervention until BP is < 140/60mm HG in hypertensive participants. < 130/55mm HG in hypertensive participants with diabetes, heart failure or chronic kidney disease.;Long Term: Maintenance of blood pressure at goal levels.   Lipids Yes   Intervention Provide education and support for participant on nutrition & aerobic/resistive exercise along with  prescribed medications to achieve LDL 70mg , HDL >40mg .   Expected Outcomes Short Term: Participant states understanding of desired cholesterol values and is compliant with medications prescribed. Participant is following exercise prescription and nutrition guidelines.;Long Term: Cholesterol controlled with medications as prescribed, with individualized exercise RX and with personalized nutrition plan. Value goals: LDL < 70mg , HDL > 40 mg.       Personal Goals Discharge:     Goals and Risk Factor Review - 08/02/15 1000    Core Components/Risk Factors/Patient Goals Review   Personal Goals Review Sedentary;Weight Management/Obesity;Increase Strength and Stamina;Diabetes;Heart Failure;Lipids;Hypertension   Review Daivd has been montioring his weight daily and he only flutates about 1-2 lbs.  He is not adding any salt to his food.  He has been doing good with exercise in rehab, but has not been exercising at home due to back pain.  He is planning to see a doctor about his back.  His blood sugars and blood pressure have been stable.  He does have a follow appt next week to review all of his levels.   Expected Outcomes Anacleto will be graduating soon and has made good progress on his goals.  We will encourage him to continue to work towards success.      Nutrition & Weight - Outcomes:     Pre Biometrics - 05/26/15 1210    Pre Biometrics   Waist Circumference 41.5 inches   Hip Circumference 39.2 inches   Waist to Hip Ratio 1.06 %         Post Biometrics - 08/09/15 0958     Post  Biometrics   Height 5\' 10"  (1.778 m)   Weight 183 lb 1.6 oz (83.054 kg)   Waist Circumference 41 inches   Hip Circumference 38 inches   Waist to Hip Ratio 1.08 %   BMI (Calculated) 26.3      Nutrition:     Nutrition Therapy & Goals - 06/22/15 1221    Nutrition Therapy   Diet Instructed patient and his wife on meal plan based on heart healthy principles and diabetes diabetes guidelines. They have already  made many positive diet changes.   Drug/Food Interactions Statins/Certain Fruits   Protein (specify units) 8   Fiber 30 grams   Whole Grain Foods 3 servings   Saturated Fats 13 max. grams   Fruits and Vegetables 5 servings/day   Sodium 2000 grams   Personal Nutrition Goals   Personal Goal #1 Use calorieking.com to look up saturated fat, trans fat and sodium especially when dining out   Personal Goal #2 Read labels for saturated and trans fat and sodium.   Personal Goal #3 Include at least 5 servings of fruits/vegetables daily.   Personal Goal #4 Balance meals with protein, 3-4 servings of carbohydrate and non-starchy vegetables.   Intervention Plan   Intervention Prescribe, educate and counsel regarding individualized specific dietary modifications aiming towards targeted core components such as weight, hypertension, lipid management, diabetes, heart failure and other comorbidities.;Nutrition handout(s) given to patient.   Expected Outcomes Short  Term Goal: Understand basic principles of dietary content, such as calories, fat, sodium, cholesterol and nutrients.;Short Term Goal: A plan has been developed with personal nutrition goals set during dietitian appointment.;Long Term Goal: Adherence to prescribed nutrition plan.      Nutrition Discharge:     Nutrition Assessments - 06/22/15 1230    Rate Your Plate Scores   Pre Score 71   Pre Score % 79 %      Education Questionnaire Score:     Knowledge Questionnaire Score - 06/08/15 1550    Knowledge Questionnaire Score   Pre Score 23/28      Goals reviewed with patient; copy given to patient.

## 2015-08-11 NOTE — Progress Notes (Signed)
Daily Session Note  Patient Details  Name: Derrick Cochran MRN: [id number] Date of Birth: [birth year] Referring Provider:        Cardiac Rehab from 07/21/2015 in [facility] Cardiac and Pulmonary Rehab   Referring Provider  Fulton Reek MD      Encounter Date: 08/11/2015  Check In:     Session Check In - 08/11/15 0823    Check-In   Location [facility]-Cardiac & Pulmonary Rehab   Staff Present Gerlene Burdock, RN, BSN;Mary Kellie Shropshire, RN, BSN, Kela Millin, BA, ACSM CEP, Exercise Physiologist   Supervising physician immediately available to respond to emergencies See telemetry face sheet for immediately available ER MD   Medication changes reported     No   Fall or balance concerns reported    No   Warm-up and Cool-down Performed on first and last piece of equipment   Resistance Training Performed Yes   VAD Patient? No   Pain Assessment   Currently in Pain? No/denies   Multiple Pain Sites No         Goals Met:  Independence with exercise equipment Exercise tolerated well No report of cardiac concerns or symptoms  Goals Unmet:  Not Applicable  Comments:  CLENT DAMORE graduated today from cardiac rehab with 36 sessions completed.  Details of the patient's exercise prescription and what he needs to do in order to continue the prescription and progress.  Patient verbalized understanding.  Joline Maxcy plans to continue to exercise by attending the [facility] fitness center.    Dr. Emily Filbert is Medical Director for [place] and [facility].

## 2015-08-16 ENCOUNTER — Encounter: Payer: Commercial Managed Care - HMO | Attending: Internal Medicine

## 2015-08-16 DIAGNOSIS — I5022 Chronic systolic (congestive) heart failure: Secondary | ICD-10-CM | POA: Insufficient documentation

## 2015-08-20 ENCOUNTER — Other Ambulatory Visit: Payer: Self-pay | Admitting: Internal Medicine

## 2015-08-20 DIAGNOSIS — I10 Essential (primary) hypertension: Secondary | ICD-10-CM | POA: Diagnosis not present

## 2015-08-20 DIAGNOSIS — Z79899 Other long term (current) drug therapy: Secondary | ICD-10-CM | POA: Diagnosis not present

## 2015-08-20 DIAGNOSIS — E7800 Pure hypercholesterolemia, unspecified: Secondary | ICD-10-CM | POA: Diagnosis not present

## 2015-08-20 DIAGNOSIS — R270 Ataxia, unspecified: Secondary | ICD-10-CM | POA: Diagnosis not present

## 2015-08-20 DIAGNOSIS — E039 Hypothyroidism, unspecified: Secondary | ICD-10-CM | POA: Diagnosis not present

## 2015-08-20 DIAGNOSIS — I714 Abdominal aortic aneurysm, without rupture, unspecified: Secondary | ICD-10-CM

## 2015-08-20 DIAGNOSIS — Z125 Encounter for screening for malignant neoplasm of prostate: Secondary | ICD-10-CM | POA: Diagnosis not present

## 2015-08-20 DIAGNOSIS — E119 Type 2 diabetes mellitus without complications: Secondary | ICD-10-CM | POA: Diagnosis not present

## 2015-08-20 DIAGNOSIS — M545 Low back pain: Secondary | ICD-10-CM | POA: Diagnosis not present

## 2015-08-25 DIAGNOSIS — I482 Chronic atrial fibrillation: Secondary | ICD-10-CM | POA: Diagnosis not present

## 2015-08-26 ENCOUNTER — Ambulatory Visit
Admission: RE | Admit: 2015-08-26 | Discharge: 2015-08-26 | Disposition: A | Discharge status: Home / Self Care | Payer: Commercial Managed Care - HMO | Source: Ambulatory Visit | Attending: Internal Medicine | Admitting: Internal Medicine

## 2015-08-26 ENCOUNTER — Ambulatory Visit: Payer: Commercial Managed Care - HMO | Admitting: Family

## 2015-08-26 DIAGNOSIS — N281 Cyst of kidney, acquired: Secondary | ICD-10-CM | POA: Insufficient documentation

## 2015-08-26 DIAGNOSIS — I700 Atherosclerosis of aorta: Secondary | ICD-10-CM | POA: Insufficient documentation

## 2015-08-26 DIAGNOSIS — I714 Abdominal aortic aneurysm, without rupture, unspecified: Secondary | ICD-10-CM

## 2015-09-07 ENCOUNTER — Encounter: Payer: Self-pay | Admitting: Family

## 2015-09-07 ENCOUNTER — Ambulatory Visit: Payer: Commercial Managed Care - HMO | Attending: Family | Admitting: Family

## 2015-09-07 VITALS — BP 122/61 | HR 87 | Resp 18 | Ht 71.0 in | Wt 184.0 lb

## 2015-09-07 DIAGNOSIS — Z881 Allergy status to other antibiotic agents status: Secondary | ICD-10-CM | POA: Insufficient documentation

## 2015-09-07 DIAGNOSIS — I714 Abdominal aortic aneurysm, without rupture: Secondary | ICD-10-CM | POA: Diagnosis not present

## 2015-09-07 DIAGNOSIS — Z7982 Long term (current) use of aspirin: Secondary | ICD-10-CM | POA: Insufficient documentation

## 2015-09-07 DIAGNOSIS — I499 Cardiac arrhythmia, unspecified: Secondary | ICD-10-CM | POA: Diagnosis not present

## 2015-09-07 DIAGNOSIS — R482 Apraxia: Secondary | ICD-10-CM | POA: Diagnosis not present

## 2015-09-07 DIAGNOSIS — E785 Hyperlipidemia, unspecified: Secondary | ICD-10-CM | POA: Insufficient documentation

## 2015-09-07 DIAGNOSIS — E039 Hypothyroidism, unspecified: Secondary | ICD-10-CM | POA: Diagnosis not present

## 2015-09-07 DIAGNOSIS — Z952 Presence of prosthetic heart valve: Secondary | ICD-10-CM | POA: Diagnosis not present

## 2015-09-07 DIAGNOSIS — M1990 Unspecified osteoarthritis, unspecified site: Secondary | ICD-10-CM | POA: Diagnosis not present

## 2015-09-07 DIAGNOSIS — Z951 Presence of aortocoronary bypass graft: Secondary | ICD-10-CM | POA: Diagnosis not present

## 2015-09-07 DIAGNOSIS — I5022 Chronic systolic (congestive) heart failure: Secondary | ICD-10-CM | POA: Diagnosis not present

## 2015-09-07 DIAGNOSIS — Z888 Allergy status to other drugs, medicaments and biological substances status: Secondary | ICD-10-CM | POA: Insufficient documentation

## 2015-09-07 DIAGNOSIS — Z7984 Long term (current) use of oral hypoglycemic drugs: Secondary | ICD-10-CM | POA: Insufficient documentation

## 2015-09-07 DIAGNOSIS — M5134 Other intervertebral disc degeneration, thoracic region: Secondary | ICD-10-CM | POA: Diagnosis not present

## 2015-09-07 DIAGNOSIS — K219 Gastro-esophageal reflux disease without esophagitis: Secondary | ICD-10-CM | POA: Insufficient documentation

## 2015-09-07 DIAGNOSIS — Z882 Allergy status to sulfonamides status: Secondary | ICD-10-CM | POA: Insufficient documentation

## 2015-09-07 DIAGNOSIS — Z85820 Personal history of malignant melanoma of skin: Secondary | ICD-10-CM | POA: Insufficient documentation

## 2015-09-07 DIAGNOSIS — Z809 Family history of malignant neoplasm, unspecified: Secondary | ICD-10-CM | POA: Insufficient documentation

## 2015-09-07 DIAGNOSIS — N400 Enlarged prostate without lower urinary tract symptoms: Secondary | ICD-10-CM | POA: Insufficient documentation

## 2015-09-07 DIAGNOSIS — R0602 Shortness of breath: Secondary | ICD-10-CM | POA: Diagnosis not present

## 2015-09-07 DIAGNOSIS — R42 Dizziness and giddiness: Secondary | ICD-10-CM | POA: Diagnosis not present

## 2015-09-07 DIAGNOSIS — I2510 Atherosclerotic heart disease of native coronary artery without angina pectoris: Secondary | ICD-10-CM | POA: Insufficient documentation

## 2015-09-07 DIAGNOSIS — R5383 Other fatigue: Secondary | ICD-10-CM | POA: Diagnosis not present

## 2015-09-07 DIAGNOSIS — I110 Hypertensive heart disease with heart failure: Secondary | ICD-10-CM | POA: Insufficient documentation

## 2015-09-07 DIAGNOSIS — I252 Old myocardial infarction: Secondary | ICD-10-CM | POA: Insufficient documentation

## 2015-09-07 DIAGNOSIS — Z8249 Family history of ischemic heart disease and other diseases of the circulatory system: Secondary | ICD-10-CM | POA: Insufficient documentation

## 2015-09-07 DIAGNOSIS — Z950 Presence of cardiac pacemaker: Secondary | ICD-10-CM | POA: Diagnosis not present

## 2015-09-07 DIAGNOSIS — Z87891 Personal history of nicotine dependence: Secondary | ICD-10-CM | POA: Insufficient documentation

## 2015-09-07 DIAGNOSIS — R011 Cardiac murmur, unspecified: Secondary | ICD-10-CM | POA: Diagnosis not present

## 2015-09-07 DIAGNOSIS — E119 Type 2 diabetes mellitus without complications: Secondary | ICD-10-CM | POA: Diagnosis not present

## 2015-09-07 DIAGNOSIS — Z841 Family history of disorders of kidney and ureter: Secondary | ICD-10-CM | POA: Insufficient documentation

## 2015-09-07 DIAGNOSIS — Z7902 Long term (current) use of antithrombotics/antiplatelets: Secondary | ICD-10-CM | POA: Insufficient documentation

## 2015-09-07 DIAGNOSIS — Z9109 Other allergy status, other than to drugs and biological substances: Secondary | ICD-10-CM | POA: Insufficient documentation

## 2015-09-07 DIAGNOSIS — I482 Chronic atrial fibrillation, unspecified: Secondary | ICD-10-CM

## 2015-09-07 DIAGNOSIS — I10 Essential (primary) hypertension: Secondary | ICD-10-CM

## 2015-09-07 NOTE — Patient Instructions (Signed)
Continue weighing daily and call for an overnight weight gain of > 2 pounds or a weekly weight gain of >5 pounds. 

## 2015-09-07 NOTE — Progress Notes (Signed)
Subjective:    Patient ID: Derrick Cochran, male    DOB: 1935-11-29, 80 y.o.   MRN: [id number]  Congestive Heart Failure  Presents for follow-up visit. Associated symptoms include fatigue and shortness of breath (mild). Pertinent negatives include no abdominal pain, chest pain, edema, orthopnea or palpitations. The symptoms have been stable. Compliance with total regimen is 76-100%.  Hypertension  This is a chronic problem. The current episode started more than 1 year ago. The problem is unchanged. The problem is controlled. Associated symptoms include shortness of breath (mild). Pertinent negatives include no chest pain, headaches, neck pain, palpitations or peripheral edema. There are no associated agents to hypertension. Risk factors for coronary artery disease include diabetes mellitus, dyslipidemia, male gender and family history. Past treatments include ACE inhibitors, beta blockers, diuretics and lifestyle changes. The current treatment provides significant improvement. There are no compliance problems.  Hypertensive end-organ damage includes CAD/MI and heart failure.   Past Medical History:  Diagnosis Date  . AAA (abdominal aortic aneurysm) ([place])   . Arrhythmia    atrial fibrillation  . Arthritis   . BPH (benign prostatic hyperplasia)   . Cancer ([place])    skin  . CHF (congestive heart failure) ([place])   . Coronary artery disease   . DDD (degenerative disc disease), thoracic   . Diabetes mellitus without complication ([place])   . Esophageal stricture   . GERD (gastroesophageal reflux disease)   . Heart murmur   . Hyperlipidemia   . Hypertension   . Hypothyroidism   . Myocardial infarction ([place])   . Shortness of breath dyspnea     Past Surgical History:  Procedure Laterality Date  . AORTIC VALVE REPLACEMENT  1995   Bioprosthetic - [place]  . APPENDECTOMY    . CARDIAC CATHETERIZATION  1994 x 2 with PCTA of RCA  . CARDIAC VALVE REPLACEMENT     Bovine transcatheter heart valve  .  COLONOSCOPY  2015  . CORONARY ARTERY BYPASS GRAFT  1995   x 4 Vessels - [facility]  . ELECTROPHYSIOLOGIC STUDY N/A 08/06/2014   Procedure: Cardioversion;  Surgeon: [name], MD;  Location: [facility];  Service: Cardiovascular;  Laterality: N/A;  . ELECTROPHYSIOLOGIC STUDY N/A 06/18/2014   Procedure: CARDIOVERSION;  Surgeon: [name], MD;  Location: [facility];  Service: Cardiovascular;  Laterality: N/A;  . ESOPHAGOGASTRODUODENOSCOPY  2014  . GREAT TOE ARTHRODESIS, INTERPHALANGEAL JOINT Left   . HERNIA REPAIR Left    Inguinal  . PACEMAKER INSERTION N/A 07/02/2014   Procedure: INSERTION PACEMAKER/DUAL CHAMBER  INITIAL IMPLANT;  Surgeon: [name], MD;  Location: [facility] OR[facility];  Service: Cardiovascular;  Laterality: N/A;    Family History  Problem Relation Age of Onset  . Cancer Mother   . Cancer Father   . Kidney disease Sister   . Heart failure Brother     Social History  Substance Use Topics  . Smoking status: Former Smoker    Packs/day: 0.50    Years: 15.00    Types: Cigarettes    Quit date: 04/22/1985  . Smokeless tobacco: Former Systems developer    Types: Chew  . Alcohol use No    Allergies  Allergen Reactions  . Cefdinir Other (See Comments)    nightmares  . Adhesive [Tape] Rash  . Sulfa Antibiotics Rash  . Sulfamethoxazole Hives, Itching and Rash    Prior to Admission medications   Medication Sig Start Date End Date Taking? Authorizing Provider  acetaminophen (TYLENOL) 500 MG tablet Take 500 mg  by mouth every 6 (six) hours as needed for mild pain, moderate pain or fever.     Historical Provider, MD  aspirin EC 81 MG EC tablet Take 1 tablet (81 mg total) by mouth daily. 04/13/15   Dustin Flock, MD  cyanocobalamin 1000 MCG tablet Take 1,000 mcg by mouth daily.    Historical Provider, MD  enalapril (VASOTEC) 5 MG tablet Take 5 mg by mouth 2 (two) times daily.     Historical Provider, MD  furosemide (LASIX) 20 MG tablet Take 20 mg by mouth every  morning.     Historical Provider, MD  gabapentin (NEURONTIN) 300 MG capsule Take 300 mg by mouth daily.     Historical Provider, MD  glipiZIDE (GLUCOTROL XL) 5 MG 24 hr tablet Take 5 mg by mouth every morning. 01/21/15   Historical Provider, MD  levothyroxine (SYNTHROID, LEVOTHROID) 137 MCG tablet Take 137 mcg by mouth daily before breakfast.     Historical Provider, MD  loratadine (CLARITIN) 10 MG tablet Take 1 tablet by mouth daily. 05/21/15 05/20/16  Historical Provider, MD  LORazepam (ATIVAN) 1 MG tablet Take 2 mg by mouth at bedtime.    Historical Provider, MD  metFORMIN (GLUCOPHAGE) 500 MG tablet Take 500 mg by mouth 2 (two) times daily.     Historical Provider, MD  metoprolol succinate (TOPROL-XL) 25 MG 24 hr tablet Take 25 mg by mouth daily.    Historical Provider, MD  nitroGLYCERIN (NITROSTAT) 0.4 MG SL tablet Place 0.4 mg under the tongue every 5 (five) minutes as needed for chest pain.    Historical Provider, MD  omeprazole (PRILOSEC) 20 MG capsule Take 20 mg by mouth every morning.     Historical Provider, MD  simvastatin (ZOCOR) 40 MG tablet Take 40 mg by mouth every evening.     Historical Provider, MD  sotalol (BETAPACE) 80 MG tablet Take 1 tablet (80 mg total) by mouth 2 (two) times daily. 07/04/14   [name], MD  tamsulosin (FLOMAX) 0.4 MG CAPS capsule Take 0.4 mg by mouth every morning.     Historical Provider, MD  temazepam (RESTORIL) 15 MG capsule Take 15 mg by mouth at bedtime.     Historical Provider, MD  warfarin (COUMADIN) 1 MG tablet Take 1 mg by mouth every evening. Pt takes with 3mg  tablet for a total dose of 4mg .    Historical Provider, MD  warfarin (COUMADIN) 3 MG tablet Take 3 mg by mouth every evening. Pt takes with 1mg  tablet for a total dose of 4mg .    Historical Provider, MD      Review of Systems  Constitutional: Positive for fatigue. Negative for appetite change.  HENT: Positive for rhinorrhea. Negative for congestion and sore throat.   Eyes: Negative.    Respiratory: Positive for shortness of breath (mild). Negative for cough and chest tightness.   Cardiovascular: Negative for chest pain, palpitations and leg swelling.  Gastrointestinal: Negative for abdominal distention and abdominal pain.  Endocrine: Negative.   Genitourinary: Negative.   Musculoskeletal: Negative for back pain and neck pain.  Skin: Negative.   Allergic/Immunologic: Negative.   Neurological: Positive for light-headedness (at times). Negative for dizziness and headaches.  Hematological: Negative for adenopathy. Does not bruise/bleed easily.  Psychiatric/Behavioral: Negative for dysphoric mood and sleep disturbance (sleeping on 1 pillow). The patient is not nervous/anxious.        Objective:   Physical Exam  Constitutional: He is oriented to person, place, and time. He appears well-developed and well-nourished.  HENT:  Head: Normocephalic and atraumatic.  Eyes: Conjunctivae are normal. Pupils are equal, round, and reactive to light.  Neck: Normal range of motion. Neck supple.  Cardiovascular: Normal rate.  An irregular rhythm present.  Pulmonary/Chest: Effort normal. He has no wheezes. He has no rales.  Abdominal: Soft. He exhibits no distension. There is no tenderness.  Musculoskeletal: He exhibits no edema or tenderness.  Neurological: He is alert and oriented to person, place, and time.  Skin: Skin is warm and dry.  Psychiatric: He has a normal mood and affect. His behavior is normal. Thought content normal.  Nursing note and vitals reviewed.  BP 122/61   Pulse 87   Resp 18   Ht 5\' 11"  (1.803 m)   Wt 184 lb (83.5 kg)   SpO2 97%   BMI 25.66 kg/m         Assessment & Plan:  1: Chronic heart failure with reduced ejection fraction- Patient presents with a mild amount of fatigue and shortness of breath with moderate exertion (Class II). Symptoms improve quickly upon rest. He continues to weigh himself and says that his weight has been stable. By our scale,  his weight is unchanged and he was reminded to call for an overnight weight gain of >2 pounds or a weekly weight gain of >5 pounds. He is not adding any salt to his food and his wife closely monitors his sodium intake. He has finished cardiac rehab and is continuing his exercise at a local gym. Is not interested in [place] at this time.  2: HTN- Blood pressure looks good today. Continue medications at this time. 3: Atrial fibrillation- Currently rate controlled on toprol XL, betapace and warfarin. Sees his cardiologist October 2017. 4: Diabetes- He says that his glucose this morning was 104. Continues to take glucotrol and glucophage. Sees his PCP October 2017.  Patient did not bring his medications nor a list. Each medication was verbally reviewed with the patient and he was encouraged to bring the bottles to every visit to confirm accuracy of list.  He mentions at the end of the visit that he will be seeing Dr. Lucky Cowboy in regards to his abdominal aneurysm that has now grown to 5cm. He sees him 09/14/15.  Return here in 4 months or sooner for any questions/problems before then.

## 2015-09-13 ENCOUNTER — Ambulatory Visit: Payer: Commercial Managed Care - HMO

## 2015-09-14 DIAGNOSIS — E119 Type 2 diabetes mellitus without complications: Secondary | ICD-10-CM | POA: Diagnosis not present

## 2015-09-14 DIAGNOSIS — I2510 Atherosclerotic heart disease of native coronary artery without angina pectoris: Secondary | ICD-10-CM | POA: Diagnosis not present

## 2015-09-14 DIAGNOSIS — I10 Essential (primary) hypertension: Secondary | ICD-10-CM | POA: Diagnosis not present

## 2015-09-14 DIAGNOSIS — I714 Abdominal aortic aneurysm, without rupture: Secondary | ICD-10-CM | POA: Diagnosis not present

## 2015-09-14 DIAGNOSIS — E785 Hyperlipidemia, unspecified: Secondary | ICD-10-CM | POA: Diagnosis not present

## 2015-09-15 ENCOUNTER — Ambulatory Visit: Payer: Commercial Managed Care - HMO

## 2015-09-17 ENCOUNTER — Ambulatory Visit: Payer: Commercial Managed Care - HMO

## 2015-09-20 ENCOUNTER — Ambulatory Visit: Payer: Commercial Managed Care - HMO

## 2015-09-22 ENCOUNTER — Ambulatory Visit: Payer: Commercial Managed Care - HMO

## 2015-09-23 DIAGNOSIS — I480 Paroxysmal atrial fibrillation: Secondary | ICD-10-CM | POA: Diagnosis not present

## 2015-09-24 ENCOUNTER — Ambulatory Visit: Payer: Commercial Managed Care - HMO

## 2015-09-27 ENCOUNTER — Ambulatory Visit: Payer: Commercial Managed Care - HMO

## 2015-09-29 ENCOUNTER — Ambulatory Visit: Payer: Commercial Managed Care - HMO

## 2015-10-01 ENCOUNTER — Ambulatory Visit: Payer: Commercial Managed Care - HMO

## 2015-10-04 ENCOUNTER — Ambulatory Visit: Payer: Commercial Managed Care - HMO

## 2015-10-06 ENCOUNTER — Ambulatory Visit: Payer: Commercial Managed Care - HMO

## 2015-10-08 ENCOUNTER — Ambulatory Visit: Payer: Commercial Managed Care - HMO

## 2015-10-11 ENCOUNTER — Ambulatory Visit: Payer: Commercial Managed Care - HMO

## 2015-10-13 ENCOUNTER — Ambulatory Visit: Payer: Commercial Managed Care - HMO

## 2015-10-15 ENCOUNTER — Ambulatory Visit: Payer: Commercial Managed Care - HMO

## 2015-10-20 ENCOUNTER — Ambulatory Visit: Payer: Commercial Managed Care - HMO

## 2015-10-22 ENCOUNTER — Ambulatory Visit: Payer: Commercial Managed Care - HMO

## 2015-10-25 ENCOUNTER — Ambulatory Visit: Payer: Commercial Managed Care - HMO

## 2015-10-25 DIAGNOSIS — I480 Paroxysmal atrial fibrillation: Secondary | ICD-10-CM | POA: Diagnosis not present

## 2015-10-25 DIAGNOSIS — Z125 Encounter for screening for malignant neoplasm of prostate: Secondary | ICD-10-CM | POA: Diagnosis not present

## 2015-10-25 DIAGNOSIS — E538 Deficiency of other specified B group vitamins: Secondary | ICD-10-CM | POA: Diagnosis not present

## 2015-10-25 DIAGNOSIS — I10 Essential (primary) hypertension: Secondary | ICD-10-CM | POA: Diagnosis not present

## 2015-10-25 DIAGNOSIS — E039 Hypothyroidism, unspecified: Secondary | ICD-10-CM | POA: Diagnosis not present

## 2015-10-25 DIAGNOSIS — Z79899 Other long term (current) drug therapy: Secondary | ICD-10-CM | POA: Diagnosis not present

## 2015-10-25 DIAGNOSIS — E119 Type 2 diabetes mellitus without complications: Secondary | ICD-10-CM | POA: Diagnosis not present

## 2015-10-25 DIAGNOSIS — E7800 Pure hypercholesterolemia, unspecified: Secondary | ICD-10-CM | POA: Diagnosis not present

## 2015-10-26 DIAGNOSIS — I482 Chronic atrial fibrillation: Secondary | ICD-10-CM | POA: Diagnosis not present

## 2015-10-27 ENCOUNTER — Ambulatory Visit: Payer: Commercial Managed Care - HMO

## 2015-10-29 ENCOUNTER — Ambulatory Visit: Payer: Commercial Managed Care - HMO

## 2015-11-01 ENCOUNTER — Ambulatory Visit: Payer: Commercial Managed Care - HMO

## 2015-11-03 ENCOUNTER — Ambulatory Visit: Payer: Commercial Managed Care - HMO

## 2015-11-05 ENCOUNTER — Ambulatory Visit: Payer: Commercial Managed Care - HMO

## 2015-11-08 ENCOUNTER — Ambulatory Visit: Payer: Commercial Managed Care - HMO

## 2015-11-10 ENCOUNTER — Ambulatory Visit: Payer: Commercial Managed Care - HMO

## 2015-11-23 DIAGNOSIS — E119 Type 2 diabetes mellitus without complications: Secondary | ICD-10-CM | POA: Diagnosis not present

## 2015-11-23 DIAGNOSIS — I482 Chronic atrial fibrillation: Secondary | ICD-10-CM | POA: Diagnosis not present

## 2015-11-23 DIAGNOSIS — I480 Paroxysmal atrial fibrillation: Secondary | ICD-10-CM | POA: Diagnosis not present

## 2015-11-23 DIAGNOSIS — I10 Essential (primary) hypertension: Secondary | ICD-10-CM | POA: Diagnosis not present

## 2015-11-23 DIAGNOSIS — E039 Hypothyroidism, unspecified: Secondary | ICD-10-CM | POA: Diagnosis not present

## 2015-11-23 DIAGNOSIS — E7800 Pure hypercholesterolemia, unspecified: Secondary | ICD-10-CM | POA: Diagnosis not present

## 2015-11-23 DIAGNOSIS — M5136 Other intervertebral disc degeneration, lumbar region: Secondary | ICD-10-CM | POA: Diagnosis not present

## 2015-11-23 DIAGNOSIS — I350 Nonrheumatic aortic (valve) stenosis: Secondary | ICD-10-CM | POA: Diagnosis not present

## 2015-11-28 ENCOUNTER — Observation Stay
Admission: EM | Admit: 2015-11-28 | Discharge: 2015-11-30 | Disposition: A | Discharge status: Home / Self Care | Payer: Commercial Managed Care - HMO | Attending: Internal Medicine | Admitting: Internal Medicine

## 2015-11-28 ENCOUNTER — Emergency Department: Payer: Commercial Managed Care - HMO

## 2015-11-28 DIAGNOSIS — I495 Sick sinus syndrome: Secondary | ICD-10-CM | POA: Diagnosis not present

## 2015-11-28 DIAGNOSIS — E039 Hypothyroidism, unspecified: Secondary | ICD-10-CM | POA: Diagnosis not present

## 2015-11-28 DIAGNOSIS — Z952 Presence of prosthetic heart valve: Secondary | ICD-10-CM | POA: Diagnosis not present

## 2015-11-28 DIAGNOSIS — Z95810 Presence of automatic (implantable) cardiac defibrillator: Secondary | ICD-10-CM | POA: Insufficient documentation

## 2015-11-28 DIAGNOSIS — Z950 Presence of cardiac pacemaker: Secondary | ICD-10-CM | POA: Insufficient documentation

## 2015-11-28 DIAGNOSIS — Z85828 Personal history of other malignant neoplasm of skin: Secondary | ICD-10-CM | POA: Diagnosis not present

## 2015-11-28 DIAGNOSIS — I429 Cardiomyopathy, unspecified: Secondary | ICD-10-CM | POA: Diagnosis not present

## 2015-11-28 DIAGNOSIS — J90 Pleural effusion, not elsewhere classified: Secondary | ICD-10-CM

## 2015-11-28 DIAGNOSIS — I482 Chronic atrial fibrillation: Secondary | ICD-10-CM | POA: Diagnosis not present

## 2015-11-28 DIAGNOSIS — I5022 Chronic systolic (congestive) heart failure: Secondary | ICD-10-CM

## 2015-11-28 DIAGNOSIS — Z794 Long term (current) use of insulin: Secondary | ICD-10-CM | POA: Insufficient documentation

## 2015-11-28 DIAGNOSIS — I252 Old myocardial infarction: Secondary | ICD-10-CM | POA: Insufficient documentation

## 2015-11-28 DIAGNOSIS — R002 Palpitations: Secondary | ICD-10-CM

## 2015-11-28 DIAGNOSIS — Z951 Presence of aortocoronary bypass graft: Secondary | ICD-10-CM | POA: Diagnosis not present

## 2015-11-28 DIAGNOSIS — I5023 Acute on chronic systolic (congestive) heart failure: Secondary | ICD-10-CM | POA: Insufficient documentation

## 2015-11-28 DIAGNOSIS — N400 Enlarged prostate without lower urinary tract symptoms: Secondary | ICD-10-CM | POA: Insufficient documentation

## 2015-11-28 DIAGNOSIS — Z91048 Other nonmedicinal substance allergy status: Secondary | ICD-10-CM | POA: Insufficient documentation

## 2015-11-28 DIAGNOSIS — D696 Thrombocytopenia, unspecified: Secondary | ICD-10-CM | POA: Insufficient documentation

## 2015-11-28 DIAGNOSIS — I2510 Atherosclerotic heart disease of native coronary artery without angina pectoris: Secondary | ICD-10-CM | POA: Diagnosis not present

## 2015-11-28 DIAGNOSIS — I714 Abdominal aortic aneurysm, without rupture: Secondary | ICD-10-CM | POA: Insufficient documentation

## 2015-11-28 DIAGNOSIS — R0600 Dyspnea, unspecified: Secondary | ICD-10-CM

## 2015-11-28 DIAGNOSIS — R079 Chest pain, unspecified: Secondary | ICD-10-CM | POA: Diagnosis not present

## 2015-11-28 DIAGNOSIS — M5134 Other intervertebral disc degeneration, thoracic region: Secondary | ICD-10-CM | POA: Insufficient documentation

## 2015-11-28 DIAGNOSIS — Z882 Allergy status to sulfonamides status: Secondary | ICD-10-CM | POA: Insufficient documentation

## 2015-11-28 DIAGNOSIS — I480 Paroxysmal atrial fibrillation: Secondary | ICD-10-CM | POA: Insufficient documentation

## 2015-11-28 DIAGNOSIS — I447 Left bundle-branch block, unspecified: Secondary | ICD-10-CM | POA: Diagnosis not present

## 2015-11-28 DIAGNOSIS — I110 Hypertensive heart disease with heart failure: Secondary | ICD-10-CM | POA: Diagnosis not present

## 2015-11-28 DIAGNOSIS — Z79899 Other long term (current) drug therapy: Secondary | ICD-10-CM | POA: Insufficient documentation

## 2015-11-28 DIAGNOSIS — I081 Rheumatic disorders of both mitral and tricuspid valves: Secondary | ICD-10-CM | POA: Insufficient documentation

## 2015-11-28 DIAGNOSIS — R0602 Shortness of breath: Secondary | ICD-10-CM | POA: Diagnosis not present

## 2015-11-28 DIAGNOSIS — E785 Hyperlipidemia, unspecified: Secondary | ICD-10-CM | POA: Insufficient documentation

## 2015-11-28 DIAGNOSIS — M1990 Unspecified osteoarthritis, unspecified site: Secondary | ICD-10-CM | POA: Diagnosis not present

## 2015-11-28 DIAGNOSIS — R0789 Other chest pain: Secondary | ICD-10-CM | POA: Diagnosis not present

## 2015-11-28 DIAGNOSIS — Z888 Allergy status to other drugs, medicaments and biological substances status: Secondary | ICD-10-CM | POA: Insufficient documentation

## 2015-11-28 DIAGNOSIS — Z7901 Long term (current) use of anticoagulants: Secondary | ICD-10-CM | POA: Insufficient documentation

## 2015-11-28 DIAGNOSIS — K219 Gastro-esophageal reflux disease without esophagitis: Secondary | ICD-10-CM | POA: Insufficient documentation

## 2015-11-28 DIAGNOSIS — E119 Type 2 diabetes mellitus without complications: Secondary | ICD-10-CM | POA: Insufficient documentation

## 2015-11-28 LAB — PROTIME-INR
INR: 2.99
Prothrombin Time: 31.7 seconds — ABNORMAL HIGH (ref 11.4–15.2)

## 2015-11-28 LAB — BASIC METABOLIC PANEL
Anion gap: 5 (ref 5–15)
BUN: 31 mg/dL — ABNORMAL HIGH (ref 6–20)
CALCIUM: 9 mg/dL (ref 8.9–10.3)
CO2: 28 mmol/L (ref 22–32)
CREATININE: 1.46 mg/dL — AB (ref 0.61–1.24)
Chloride: 106 mmol/L (ref 101–111)
GFR, EST AFRICAN AMERICAN: 51 mL/min — AB (ref >60)
GFR, EST NON AFRICAN AMERICAN: 44 mL/min — AB (ref >60)
GLUCOSE: 100 mg/dL — AB (ref 65–99)
Potassium: 4.5 mmol/L (ref 3.5–5.1)
Sodium: 139 mmol/L (ref 135–145)

## 2015-11-28 LAB — TROPONIN I: Troponin I: <0.03 ng/mL (ref <0.03)

## 2015-11-28 LAB — MAGNESIUM: Magnesium: 1.4 mg/dL — ABNORMAL LOW (ref 1.7–2.4)

## 2015-11-28 LAB — CBC
HCT: 38.3 % — ABNORMAL LOW (ref 40.0–52.0)
Hemoglobin: 13.3 g/dL (ref 13.0–18.0)
MCH: 33.8 pg (ref 26.0–34.0)
MCHC: 34.7 g/dL (ref 32.0–36.0)
MCV: 97.4 fL (ref 80.0–100.0)
PLATELETS: 89 10*3/uL — AB (ref 150–440)
RBC: 3.94 MIL/uL — ABNORMAL LOW (ref 4.40–5.90)
RDW: 13.6 % (ref 11.5–14.5)
WBC: 7.5 10*3/uL (ref 3.8–10.6)

## 2015-11-28 LAB — TSH: TSH: 0.693 u[IU]/mL (ref 0.350–4.500)

## 2015-11-28 LAB — GLUCOSE, CAPILLARY: Glucose-Capillary: 84 mg/dL (ref 65–99)

## 2015-11-28 LAB — BRAIN NATRIURETIC PEPTIDE: B NATRIURETIC PEPTIDE 5: 1701 pg/mL — AB (ref 0.0–100.0)

## 2015-11-28 MED ORDER — ONDANSETRON HCL 4 MG PO TABS: 4.0000 mg | ORAL_TABLET | Freq: Four times a day (QID) | ORAL | Status: DC | PRN

## 2015-11-28 MED ORDER — SODIUM CHLORIDE 0.9% FLUSH: 3.0000 mL | INTRAVENOUS | Status: DC | PRN

## 2015-11-28 MED ORDER — INSULIN ASPART 100 UNIT/ML ~~LOC~~ SOLN
0.0000 [IU] | Freq: Three times a day (TID) | SUBCUTANEOUS | Status: DC
  Administered 2015-11-29 – 2015-11-30 (×2): 2 [IU] via SUBCUTANEOUS
  Filled 2015-11-28 (×2): qty 2

## 2015-11-28 MED ORDER — INSULIN ASPART 100 UNIT/ML ~~LOC~~ SOLN: 0.0000 [IU] | Freq: Every day | SUBCUTANEOUS | Status: DC

## 2015-11-28 MED ORDER — ONDANSETRON HCL 4 MG/2ML IJ SOLN: 4.0000 mg | Freq: Four times a day (QID) | INTRAMUSCULAR | Status: DC | PRN

## 2015-11-28 MED ORDER — GABAPENTIN 300 MG PO CAPS
300.0000 mg | ORAL_CAPSULE | Freq: Every day | ORAL | Status: DC
  Administered 2015-11-29 – 2015-11-30 (×2): 300 mg via ORAL
  Filled 2015-11-28 (×2): qty 1

## 2015-11-28 MED ORDER — TAMSULOSIN HCL 0.4 MG PO CAPS
0.4000 mg | ORAL_CAPSULE | Freq: Every morning | ORAL | Status: DC
  Administered 2015-11-29 – 2015-11-30 (×2): 0.4 mg via ORAL
  Filled 2015-11-28 (×2): qty 1

## 2015-11-28 MED ORDER — ASPIRIN 81 MG PO CHEW
324.0000 mg | CHEWABLE_TABLET | Freq: Once | ORAL | Status: AC
  Administered 2015-11-28: 324 mg via ORAL
  Filled 2015-11-28: qty 4

## 2015-11-28 MED ORDER — WARFARIN SODIUM 1 MG PO TABS
1.0000 mg | ORAL_TABLET | Freq: Every evening | ORAL | Status: DC
  Administered 2015-11-28: 1 mg via ORAL
  Filled 2015-11-28 (×2): qty 1

## 2015-11-28 MED ORDER — SODIUM CHLORIDE 0.9% FLUSH
3.0000 mL | Freq: Two times a day (BID) | INTRAVENOUS | Status: DC
  Administered 2015-11-29: 3 mL via INTRAVENOUS

## 2015-11-28 MED ORDER — PANTOPRAZOLE SODIUM 40 MG PO TBEC
40.0000 mg | DELAYED_RELEASE_TABLET | Freq: Every day | ORAL | Status: DC
  Administered 2015-11-29 – 2015-11-30 (×2): 40 mg via ORAL
  Filled 2015-11-28 (×2): qty 1

## 2015-11-28 MED ORDER — SODIUM CHLORIDE 0.9 % IV SOLN: 250.0000 mL | INTRAVENOUS | Status: DC | PRN

## 2015-11-28 MED ORDER — LORATADINE 10 MG PO TABS
10.0000 mg | ORAL_TABLET | Freq: Every day | ORAL | Status: DC
  Administered 2015-11-29 – 2015-11-30 (×2): 10 mg via ORAL
  Filled 2015-11-28 (×2): qty 1

## 2015-11-28 MED ORDER — SOTALOL HCL 80 MG PO TABS
80.0000 mg | ORAL_TABLET | Freq: Two times a day (BID) | ORAL | Status: DC
  Administered 2015-11-28 – 2015-11-29 (×2): 80 mg via ORAL
  Filled 2015-11-28 (×2): qty 1

## 2015-11-28 MED ORDER — SODIUM CHLORIDE 0.9% FLUSH
3.0000 mL | Freq: Two times a day (BID) | INTRAVENOUS | Status: DC
  Administered 2015-11-28 – 2015-11-29 (×2): 3 mL via INTRAVENOUS

## 2015-11-28 MED ORDER — ENALAPRIL MALEATE 5 MG PO TABS
5.0000 mg | ORAL_TABLET | Freq: Two times a day (BID) | ORAL | Status: DC
  Administered 2015-11-28 – 2015-11-30 (×3): 5 mg via ORAL
  Filled 2015-11-28 (×4): qty 1

## 2015-11-28 MED ORDER — GLIPIZIDE ER 5 MG PO TB24
5.0000 mg | ORAL_TABLET | ORAL | Status: DC
  Administered 2015-11-29 – 2015-11-30 (×2): 5 mg via ORAL
  Filled 2015-11-28 (×2): qty 1

## 2015-11-28 MED ORDER — LEVOTHYROXINE SODIUM 137 MCG PO TABS
137.0000 ug | ORAL_TABLET | Freq: Every day | ORAL | Status: DC
  Administered 2015-11-29 – 2015-11-30 (×2): 137 ug via ORAL
  Filled 2015-11-28 (×2): qty 1

## 2015-11-28 MED ORDER — DOCUSATE SODIUM 100 MG PO CAPS
100.0000 mg | ORAL_CAPSULE | Freq: Two times a day (BID) | ORAL | Status: DC
  Administered 2015-11-28 – 2015-11-30 (×4): 100 mg via ORAL
  Filled 2015-11-28 (×4): qty 1

## 2015-11-28 MED ORDER — FUROSEMIDE 10 MG/ML IJ SOLN
40.0000 mg | Freq: Every day | INTRAMUSCULAR | Status: DC
  Administered 2015-11-29: 40 mg via INTRAVENOUS
  Filled 2015-11-28: qty 4

## 2015-11-28 MED ORDER — FUROSEMIDE 10 MG/ML IJ SOLN
40.0000 mg | Freq: Once | INTRAMUSCULAR | Status: AC
  Administered 2015-11-28: 40 mg via INTRAVENOUS
  Filled 2015-11-28: qty 4

## 2015-11-28 MED ORDER — NITROGLYCERIN 0.4 MG SL SUBL: 0.4000 mg | SUBLINGUAL_TABLET | SUBLINGUAL | Status: DC | PRN

## 2015-11-28 MED ORDER — ASPIRIN EC 81 MG PO TBEC
81.0000 mg | DELAYED_RELEASE_TABLET | Freq: Every day | ORAL | Status: DC
  Administered 2015-11-29 – 2015-11-30 (×2): 81 mg via ORAL
  Filled 2015-11-28 (×2): qty 1

## 2015-11-28 MED ORDER — METOPROLOL SUCCINATE ER 25 MG PO TB24
25.0000 mg | ORAL_TABLET | Freq: Every day | ORAL | Status: DC
  Administered 2015-11-29 – 2015-11-30 (×2): 25 mg via ORAL
  Filled 2015-11-28 (×2): qty 1

## 2015-11-28 MED ORDER — SIMVASTATIN 40 MG PO TABS
40.0000 mg | ORAL_TABLET | Freq: Every evening | ORAL | Status: DC
  Administered 2015-11-28 – 2015-11-29 (×2): 40 mg via ORAL
  Filled 2015-11-28 (×2): qty 1

## 2015-11-28 MED ORDER — METFORMIN HCL 500 MG PO TABS
500.0000 mg | ORAL_TABLET | Freq: Two times a day (BID) | ORAL | Status: DC
  Administered 2015-11-29: 500 mg via ORAL
  Filled 2015-11-28: qty 1

## 2015-11-28 MED ORDER — TEMAZEPAM 15 MG PO CAPS
15.0000 mg | ORAL_CAPSULE | Freq: Every day | ORAL | Status: DC
  Administered 2015-11-28 – 2015-11-29 (×2): 15 mg via ORAL
  Filled 2015-11-28 (×2): qty 1

## 2015-11-28 MED ORDER — WARFARIN - PHYSICIAN DOSING INPATIENT
Freq: Every day | Status: DC
  Filled 2015-11-28 (×3): qty 1

## 2015-11-28 MED ORDER — INSULIN ASPART 100 UNIT/ML ~~LOC~~ SOLN
3.0000 [IU] | Freq: Three times a day (TID) | SUBCUTANEOUS | Status: DC
  Administered 2015-11-29 – 2015-11-30 (×5): 3 [IU] via SUBCUTANEOUS
  Filled 2015-11-28 (×5): qty 3

## 2015-11-28 MED ORDER — ACETAMINOPHEN 500 MG PO TABS: 500.0000 mg | ORAL_TABLET | Freq: Four times a day (QID) | ORAL | Status: DC | PRN

## 2015-11-28 MED ORDER — WARFARIN SODIUM 3 MG PO TABS
3.0000 mg | ORAL_TABLET | Freq: Every evening | ORAL | Status: DC
  Administered 2015-11-28: 3 mg via ORAL
  Filled 2015-11-28 (×2): qty 1

## 2015-11-28 NOTE — H&P (Signed)
[facility] [place] [facility] NAME: Derrick Cochran    MR#:  [id number]  DATE OF BIRTH:  1935/10/26  DATE OF ADMISSION:  11/28/2015  PRIMARY CARE PHYSICIAN: SPARKS,JEFFREY D, MD   REQUESTING/REFERRING PHYSICIAN:   CHIEF COMPLAINT:   Chief Complaint  Patient presents with  . Chest Pain  . Shortness of Breath    HISTORY OF PRESENT ILLNESS: Derrick Cochran  is a 80 y.o. male with a known history of Atrial fibrillation, permanent pacemaker, chronic systolic CHF, coronary artery disease, diabetes mellitus type 2, hyperlipidemia, gastroesophageal reflux disease, who presents to the hospital with complaints of palpitations, increased heart rate, dyspnea, chest pain. According to the patient, patient was doing well up until 3 AM on the day of admission when he started feeling palpitations, he was also very short of breath. He was having intermittent palpitations, increased heart rate, as well as shortness of breath all day long. At around 4:30 PM, however, he laid down, and sedated having chest pain, which he described as dull, 7 out of 10 by intensity, constant pain, lasting approximately 10 minutes. No radiation was noted. No aggravating, alleviating symptoms, chest pain was associated with shortness of breath as well as feeling presyncopal. The patient felt weak, as well as dizzy intermittently all day long. He decided to come to emergency room for further evaluation and treatment. On arrival to the hospital. Patient's heart rate was in 90s, oxygen saturation was 95-97% on room air. EKG revealed A. fib with ventricular paced complexes, left bundle branch block, nonspecific ST-T changes. Labs reveal creatinine of 1.46, mildly elevated BNP 31, elevated beta-type natruretic peptide at 1700, thrombocytopenia. Troponin was negative 1. A chest x-ray showed small left pleural effusion. Hospitalist services were contacted for admission.     PAST MEDICAL HISTORY:    Past Medical History:  Diagnosis Date  . AAA (abdominal aortic aneurysm) ([place])   . Arrhythmia    atrial fibrillation  . Arthritis   . BPH (benign prostatic hyperplasia)   . Cancer ([place])    skin  . CHF (congestive heart failure) ([place])   . Coronary artery disease   . DDD (degenerative disc disease), thoracic   . Diabetes mellitus without complication ([place])   . Esophageal stricture   . GERD (gastroesophageal reflux disease)   . Heart murmur   . Hyperlipidemia   . Hypertension   . Hypothyroidism   . Myocardial infarction   . Shortness of breath dyspnea     PAST SURGICAL HISTORY: Past Surgical History:  Procedure Laterality Date  . AORTIC VALVE REPLACEMENT  1995   Bioprosthetic - [place]  . APPENDECTOMY    . CARDIAC CATHETERIZATION  1994 x 2 with PCTA of RCA  . CARDIAC VALVE REPLACEMENT     Bovine transcatheter heart valve  . COLONOSCOPY  2015  . CORONARY ARTERY BYPASS GRAFT  1995   x 4 Vessels - [facility]  . ELECTROPHYSIOLOGIC STUDY N/A 08/06/2014   Procedure: Cardioversion;  Surgeon: [name], MD;  Location: [facility];  Service: Cardiovascular;  Laterality: N/A;  . ELECTROPHYSIOLOGIC STUDY N/A 06/18/2014   Procedure: CARDIOVERSION;  Surgeon: [name], MD;  Location: [facility];  Service: Cardiovascular;  Laterality: N/A;  . ESOPHAGOGASTRODUODENOSCOPY  2014  . GREAT TOE ARTHRODESIS, INTERPHALANGEAL JOINT Left   . HERNIA REPAIR Left    Inguinal  . PACEMAKER INSERTION N/A 07/02/2014   Procedure: INSERTION PACEMAKER/DUAL CHAMBER  INITIAL IMPLANT;  Surgeon: [name], MD;  Location:  [facility];  Service: Cardiovascular;  Laterality: N/A;    SOCIAL HISTORY:  Social History  Substance Use Topics  . Smoking status: Former Smoker    Packs/day: 0.50    Years: 15.00    Types: Cigarettes    Quit date: 04/22/1985  . Smokeless tobacco: Former Systems developer    Types: Chew  . Alcohol use No    FAMILY HISTORY:  Family History  Problem Relation Age of Onset   . Cancer Mother   . Cancer Father   . Kidney disease Sister   . Heart failure Brother     DRUG ALLERGIES:  Allergies  Allergen Reactions  . Cefdinir Other (See Comments)    nightmares  . Adhesive [Tape] Rash  . Sulfa Antibiotics Rash  . Sulfamethoxazole Hives, Itching and Rash    Review of Systems  Constitutional: Negative for chills, fever and weight loss.  HENT: Negative for congestion.   Eyes: Positive for blurred vision. Negative for double vision.  Respiratory: Positive for shortness of breath and wheezing. Negative for cough and sputum production.   Cardiovascular: Positive for chest pain, palpitations and leg swelling. Negative for orthopnea and PND.  Gastrointestinal: Positive for nausea. Negative for abdominal pain, blood in stool, constipation, diarrhea and vomiting.  Genitourinary: Negative for dysuria, frequency, hematuria and urgency.  Musculoskeletal: Negative for falls.  Neurological: Positive for weakness. Negative for dizziness, tremors, focal weakness and headaches.  Endo/Heme/Allergies: Does not bruise/bleed easily.  Psychiatric/Behavioral: Negative for depression. The patient does not have insomnia.     MEDICATIONS AT HOME:  Prior to Admission medications   Medication Sig Start Date End Date Taking? Authorizing Provider  acetaminophen (TYLENOL) 500 MG tablet Take 500 mg by mouth every 6 (six) hours as needed for mild pain, moderate pain or fever.    Yes Historical Provider, MD  aspirin EC 81 MG EC tablet Take 1 tablet (81 mg total) by mouth daily. 04/13/15  Yes Dustin Flock, MD  celecoxib (CELEBREX) 200 MG capsule Take 200 mg by mouth daily.   Yes Historical Provider, MD  cyanocobalamin 1000 MCG tablet Take 1,000 mcg by mouth daily.   Yes Historical Provider, MD  enalapril (VASOTEC) 5 MG tablet Take 5 mg by mouth 2 (two) times daily.    Yes Historical Provider, MD  furosemide (LASIX) 20 MG tablet Take 20 mg by mouth every morning.    Yes Historical  Provider, MD  gabapentin (NEURONTIN) 300 MG capsule Take 300 mg by mouth daily.    Yes Historical Provider, MD  glipiZIDE (GLUCOTROL XL) 5 MG 24 hr tablet Take 5 mg by mouth every morning. 01/21/15  Yes Historical Provider, MD  levothyroxine (SYNTHROID, LEVOTHROID) 137 MCG tablet Take 137 mcg by mouth daily before breakfast.    Yes Historical Provider, MD  loratadine (CLARITIN) 10 MG tablet Take 1 tablet by mouth daily. 05/21/15 05/20/16 Yes Historical Provider, MD  metFORMIN (GLUCOPHAGE) 500 MG tablet Take 500 mg by mouth 2 (two) times daily.    Yes Historical Provider, MD  metoprolol succinate (TOPROL-XL) 25 MG 24 hr tablet Take 25 mg by mouth daily.   Yes Historical Provider, MD  omeprazole (PRILOSEC) 20 MG capsule Take 20 mg by mouth every morning.    Yes Historical Provider, MD  simvastatin (ZOCOR) 40 MG tablet Take 40 mg by mouth every evening.    Yes Historical Provider, MD  sotalol (BETAPACE) 80 MG tablet Take 1 tablet (80 mg total) by mouth 2 (two) times daily. 07/04/14  Yes Alexander Paraschos,  MD  tamsulosin (FLOMAX) 0.4 MG CAPS capsule Take 0.4 mg by mouth every morning.    Yes Historical Provider, MD  temazepam (RESTORIL) 15 MG capsule Take 15 mg by mouth at bedtime.    Yes Historical Provider, MD  warfarin (COUMADIN) 1 MG tablet Take 1 mg by mouth every evening. Pt takes with 3mg  tablet for a total dose of 4mg .   Yes Historical Provider, MD  warfarin (COUMADIN) 3 MG tablet Take 3 mg by mouth every evening. Pt takes with 1mg  tablet for a total dose of 4mg .   Yes Historical Provider, MD  nitroGLYCERIN (NITROSTAT) 0.4 MG SL tablet Place 0.4 mg under the tongue every 5 (five) minutes as needed for chest pain.    Historical Provider, MD      PHYSICAL EXAMINATION:   VITAL SIGNS: Blood pressure (!) 108/52, pulse 95, temperature 97.5 F (36.4 C), temperature source Oral, resp. rate 18, height 5\' 10"  (1.778 m), weight 80.3 kg (177 lb), SpO2 95 %.  GENERAL:  80 y.o.-year-old patient lying in the  bed with no acute distress.  EYES: Pupils equal, round, reactive to light and accommodation. No scleral icterus. Extraocular muscles intact.  HEENT: Head atraumatic, normocephalic. Oropharynx and nasopharynx clear.  NECK:  Supple, no jugular venous distention. No thyroid enlargement, no tenderness.  LUNGS: Diminished breath sounds in left base, normal air entrance on the right base, no wheezing, rales,rhonchi or crepitation. No use of accessory muscles of respiration.  CARDIOVASCULAR: S1, S2 , Irregularly irregular. No murmurs, rubs, or gallops.  ABDOMEN: Soft, nontender, nondistended. Bowel sounds present. No organomegaly or mass.  EXTREMITIES: 1+ lower extremity and pedal edema, no cyanosis, no calf tenderness, or clubbing.  NEUROLOGIC: Cranial nerves II through XII are intact. Muscle strength 5/5 in all extremities. Sensation intact. Gait not checked.  PSYCHIATRIC: The patient is alert and oriented x 3.  SKIN: No obvious rash, lesion, or ulcer.   LABORATORY PANEL:   CBC  Recent Labs Lab 11/28/15 1719  WBC 7.5  HGB 13.3  HCT 38.3*  PLT 89*  MCV 97.4  MCH 33.8  MCHC 34.7  RDW 13.6   ------------------------------------------------------------------------------------------------------------------  Chemistries   Recent Labs Lab 11/28/15 1719  NA 139  K 4.5  CL 106  CO2 28  GLUCOSE 100*  BUN 31*  CREATININE 1.46*  CALCIUM 9.0   ------------------------------------------------------------------------------------------------------------------  Cardiac Enzymes  Recent Labs Lab 11/28/15 1719  TROPONINI <0.03   ------------------------------------------------------------------------------------------------------------------  RADIOLOGY: Dg Chest 2 View  Result Date: 11/28/2015 CLINICAL DATA:  Initial evaluation for acute chest pain with shortness of breath. EXAM: CHEST  2 VIEW COMPARISON:  Prior radiograph from 04/11/2015. FINDINGS: Cardiomegaly is stable. Median  sternotomy wires with underlying valvular prosthesis in sequela prior CABG noted. Left-sided pacemaker/ AICD. Mediastinal silhouette within normal limits. Aortic atherosclerosis noted. Lungs are normally inflated. No focal infiltrates. No pulmonary edema. Small left pleural effusion. No pneumothorax. No acute osseous abnormality. IMPRESSION: 1. Small left pleural effusion. 2. No other active cardiopulmonary disease. Electronically Signed   By: Jeannine Boga M.D.   On: 11/28/2015 17:59    EKG: Orders placed or performed during the hospital encounter of 11/28/15  . EKG 12-Lead  . EKG 12-Lead  . ED EKG within 10 minutes  . ED EKG within 10 minutes   EKG in the emergency room revealed A. fib at a rate of 86 beats per minutes. Occasional ventricular paced complexes,, right ward axis, left bundle branch block, nonspecific ST-T changes IMPRESSION AND PLAN:  Active  Problems:   Palpitations   Dyspnea   Pleural effusion   Chest pain #1 chest pain,  palpitations related, per history, admit the patient medical floor, continue Toprol, aspirin, follow cardiac enzymes 3, good cardiologist involved for recommendations #2, palpitations, rule out A. fib, RVR versus any other arrhythmia, continue beta blocker, get magnesium, TSH, follow on telemetry #3, pleural effusion, advance Lasix to 40 once daily dose #4. Dyspnea, likely due to acute on chronic systolic CHF, continue Lasix, oxygen therapy as needed, unlikely PE due to good anticoagulation  All the records are reviewed and case discussed with ED provider. Management plans discussed with the patient, family and they are in agreement.  CODE STATUS: Code Status History    Date Active Date Inactive Code Status Order ID Comments User Context   04/11/2015 10:22 AM 04/13/2015  4:03 PM Full Code [id number]  Dustin Flock, MD ED   07/02/2014  3:47 PM 07/04/2014  1:52 PM Full Code [id number]  [name], MD Inpatient    Advance Directive  Documentation   Flowsheet Row Most Recent Value  Type of Advance Directive  Healthcare Power of Attorney, Living will  Pre-existing out of facility DNR order (yellow form or pink MOST form)  No data  "MOST" Form in Place?  No data       TOTAL TIME TAKING CARE OF THIS PATIENT: 50 minutes.    Theodoro Grist M.D on 11/28/2015 at 7:53 PM  Between 7am to 6pm - Pager - [phone] After 6pm go to [email or web address] - password [email or web address] [place] [facility]  Office  [phone]  CC: Primary care physician; [name], MD

## 2015-11-28 NOTE — ED Triage Notes (Signed)
Pt c/o having chest pain about 36min ago, denies pain at present, but is having c/o SOB.. Pt does have a pacemaker and heart valve replacement.Derrick Cochran

## 2015-11-28 NOTE — Progress Notes (Signed)
Derrick Cochran responded to an OR for prayer. Pt was in good spirits. We spoke of his reason for being admitted (chest pains) and of his church and religious beliefs. Pt wanted a prayer of thanksgiving for all God has done for him which was provided. CH is available for follow up as needed.    11/28/15 2100  Clinical Encounter Type  Visited With Patient;Family;Health care provider  Visit Type Initial;Spiritual support  Referral From Nurse  Consult/Referral To Chaplain  Spiritual Encounters  Spiritual Needs Prayer

## 2015-11-28 NOTE — Care Management Obs Status (Signed)
[facility] NOTIFICATION   Patient Details  Name: Derrick Cochran MRN: [id number] Date of Birth: [birth year]   Medicare Observation Status Notification Given:  Yes    CrutchfieldAntony Haste, RN 11/28/2015, 7:58 PM

## 2015-11-28 NOTE — ED Provider Notes (Signed)
[facility] Emergency Department Provider Note  ____________________________________________  Time seen: Approximately 6:59 PM  I have reviewed the triage vital signs and the nursing notes.   HISTORY  Chief Complaint Chest Pain and Shortness of Breath   HPI Derrick Cochran is a 80 y.o. male history of paroxysmal atrial fibrillation on Coumadin s/p pacer, CHF with EF 20-25% (03/2015), aortic valve replacement, CAD status post CABG, diabetes, hypertension, hyperlipidemia who presents for evaluation of chest pain. Patient reports that he woke up this morning with palpitations. Throughout the day he has had intermittent palpitations and severe shortness of breath. Around 4:00 he was laying bed when he developed chest pain that he described as tightness, severe, located substernally, nonradiating, associated with dizziness and shortness of breath. No nausea or vomiting. The pain lasted about 15 minutes and resolved with no intervention. Patient continues to feel short of breath which is worse with exertion. He reports that he has been taking his Lasix and his weight has been unchanged on daily checks. Patient has no chest pain at this time and reports just mild shortness of breath while laying bed. No orthopnea. He does have edema of his lower extremities left greater than right which she describes as old. No prior history of PEs or DVTs. No recent illness, fever, cough.  Past Medical History:  Diagnosis Date  . AAA (abdominal aortic aneurysm) ([place])   . Arrhythmia    atrial fibrillation  . Arthritis   . BPH (benign prostatic hyperplasia)   . Cancer ([place])    skin  . CHF (congestive heart failure) ([place])   . Coronary artery disease   . DDD (degenerative disc disease), thoracic   . Diabetes mellitus without complication ([place])   . Esophageal stricture   . GERD (gastroesophageal reflux disease)   . Heart murmur   . Hyperlipidemia   . Hypertension   . Hypothyroidism    . Myocardial infarction   . Shortness of breath dyspnea     Patient Active Problem List   Diagnosis Date Noted  . Chronic systolic heart failure ([place]) 04/23/2015  . HTN (hypertension) 04/23/2015  . Diabetes ([place]) 04/23/2015  . Sick sinus syndrome ([place]) 07/03/2014  . Atrial fibrillation ([place]) 06/17/2014    Past Surgical History:  Procedure Laterality Date  . AORTIC VALVE REPLACEMENT  1995   Bioprosthetic - [place]  . APPENDECTOMY    . CARDIAC CATHETERIZATION  1994 x 2 with PCTA of RCA  . CARDIAC VALVE REPLACEMENT     Bovine transcatheter heart valve  . COLONOSCOPY  2015  . CORONARY ARTERY BYPASS GRAFT  1995   x 4 Vessels - [facility]  . ELECTROPHYSIOLOGIC STUDY N/A 08/06/2014   Procedure: Cardioversion;  Surgeon: [name], MD;  Location: [facility];  Service: Cardiovascular;  Laterality: N/A;  . ELECTROPHYSIOLOGIC STUDY N/A 06/18/2014   Procedure: CARDIOVERSION;  Surgeon: [name], MD;  Location: [facility];  Service: Cardiovascular;  Laterality: N/A;  . ESOPHAGOGASTRODUODENOSCOPY  2014  . GREAT TOE ARTHRODESIS, INTERPHALANGEAL JOINT Left   . HERNIA REPAIR Left    Inguinal  . PACEMAKER INSERTION N/A 07/02/2014   Procedure: INSERTION PACEMAKER/DUAL CHAMBER  INITIAL IMPLANT;  Surgeon: [name], MD;  Location: [facility] OR[facility];  Service: Cardiovascular;  Laterality: N/A;    Prior to Admission medications   Medication Sig Start Date End Date Taking? Authorizing Provider  acetaminophen (TYLENOL) 500 MG tablet Take 500 mg by mouth every 6 (six) hours as needed for mild pain, moderate  pain or fever.     Historical Provider, MD  aspirin EC 81 MG EC tablet Take 1 tablet (81 mg total) by mouth daily. 04/13/15   Dustin Flock, MD  cyanocobalamin 1000 MCG tablet Take 1,000 mcg by mouth daily.    Historical Provider, MD  enalapril (VASOTEC) 5 MG tablet Take 5 mg by mouth 2 (two) times daily.     Historical Provider, MD  furosemide (LASIX) 20 MG tablet Take 20 mg by  mouth every morning.     Historical Provider, MD  gabapentin (NEURONTIN) 300 MG capsule Take 300 mg by mouth daily.     Historical Provider, MD  glipiZIDE (GLUCOTROL XL) 5 MG 24 hr tablet Take 5 mg by mouth every morning. 01/21/15   Historical Provider, MD  levothyroxine (SYNTHROID, LEVOTHROID) 137 MCG tablet Take 137 mcg by mouth daily before breakfast.     Historical Provider, MD  loratadine (CLARITIN) 10 MG tablet Take 1 tablet by mouth daily. 05/21/15 05/20/16  Historical Provider, MD  LORazepam (ATIVAN) 1 MG tablet Take 2 mg by mouth at bedtime.    Historical Provider, MD  metFORMIN (GLUCOPHAGE) 500 MG tablet Take 500 mg by mouth 2 (two) times daily.     Historical Provider, MD  metoprolol succinate (TOPROL-XL) 25 MG 24 hr tablet Take 25 mg by mouth daily.    Historical Provider, MD  nitroGLYCERIN (NITROSTAT) 0.4 MG SL tablet Place 0.4 mg under the tongue every 5 (five) minutes as needed for chest pain.    Historical Provider, MD  omeprazole (PRILOSEC) 20 MG capsule Take 20 mg by mouth every morning.     Historical Provider, MD  simvastatin (ZOCOR) 40 MG tablet Take 40 mg by mouth every evening.     Historical Provider, MD  sotalol (BETAPACE) 80 MG tablet Take 1 tablet (80 mg total) by mouth 2 (two) times daily. 07/04/14   [name], MD  tamsulosin (FLOMAX) 0.4 MG CAPS capsule Take 0.4 mg by mouth every morning.     Historical Provider, MD  temazepam (RESTORIL) 15 MG capsule Take 15 mg by mouth at bedtime.     Historical Provider, MD  warfarin (COUMADIN) 1 MG tablet Take 1 mg by mouth every evening. Pt takes with 3mg  tablet for a total dose of 4mg .    Historical Provider, MD  warfarin (COUMADIN) 3 MG tablet Take 3 mg by mouth every evening. Pt takes with 1mg  tablet for a total dose of 4mg .    Historical Provider, MD    Allergies Cefdinir; Adhesive [tape]; Sulfa antibiotics; and Sulfamethoxazole  Family History  Problem Relation Age of Onset  . Cancer Mother   . Cancer Father   .  Kidney disease Sister   . Heart failure Brother     Social History Social History  Substance Use Topics  . Smoking status: Former Smoker    Packs/day: 0.50    Years: 15.00    Types: Cigarettes    Quit date: 04/22/1985  . Smokeless tobacco: Former Systems developer    Types: Chew  . Alcohol use No    Review of Systems  Constitutional: Negative for fever. + dizziness Eyes: Negative for visual changes. ENT: Negative for sore throat. Cardiovascular: + chest pain. Respiratory: + shortness of breath. Gastrointestinal: Negative for abdominal pain, vomiting or diarrhea. Genitourinary: Negative for dysuria. Musculoskeletal: Negative for back pain. Skin: Negative for rash. Neurological: Negative for headaches, weakness or numbness.  ____________________________________________   PHYSICAL EXAM:  VITAL SIGNS: ED Triage Vitals  Enc Vitals  Group     BP 11/28/15 1723 116/84     Pulse Rate 11/28/15 1723 92     Resp 11/28/15 1723 18     Temp 11/28/15 1723 97.5 F (36.4 C)     Temp Source 11/28/15 1723 Oral     SpO2 11/28/15 1723 97 %     Weight 11/28/15 1717 177 lb (80.3 kg)     Height 11/28/15 1717 5\' 10"  (1.778 m)     Head Circumference --      Peak Flow --      Pain Score --      Pain Loc --      Pain Edu? --      Excl. in [place]? --     Constitutional: Alert and oriented. Well appearing and in no apparent distress. HEENT:      Head: Normocephalic and atraumatic.         Eyes: Conjunctivae are normal. Sclera is non-icteric. EOMI. PERRL      Mouth/Throat: Mucous membranes are moist.       Neck: Supple with no signs of meningismus. Cardiovascular: Regular rate and rhythm. No murmurs, gallops, or rubs. 2+ symmetrical distal pulses are present in all extremities. No JVD. Respiratory: Normal respiratory effort. Lungs are clear to auscultation bilaterally. No wheezes, crackles, or rhonchi.  Gastrointestinal: Soft, non tender, and non distended with positive bowel sounds. No rebound or  guarding. Musculoskeletal: Trace edema on RLE and 1+ pitting edema to mid shin on LLE Neurologic: Normal speech and language. Face is symmetric. Moving all extremities. No gross focal neurologic deficits are appreciated. Skin: Skin is warm, dry and intact. No rash noted. Psychiatric: Mood and affect are normal. Speech and behavior are normal.  ____________________________________________   LABS (all labs ordered are listed, but only abnormal results are displayed)  Labs Reviewed  BASIC METABOLIC PANEL - Abnormal; Notable for the following:       Result Value   Glucose, Bld 100 (*)    BUN 31 (*)    Creatinine, Ser 1.46 (*)    GFR calc non Af Amer 44 (*)    GFR calc Af Amer 51 (*)    All other components within normal limits  CBC - Abnormal; Notable for the following:    RBC 3.94 (*)    HCT 38.3 (*)    Platelets 89 (*)    All other components within normal limits  BRAIN NATRIURETIC PEPTIDE - Abnormal; Notable for the following:    B Natriuretic Peptide 1,701.0 (*)    All other components within normal limits  PROTIME-INR - Abnormal; Notable for the following:    Prothrombin Time 31.7 (*)    All other components within normal limits  TROPONIN I   ____________________________________________  EKG  ED ECG REPORT I, Rudene Re, the attending physician, personally viewed and interpreted this ECG.  Atrial fibrillation, rate of 86, occasional PVCs, LBBB, prolonged QTC, right axis deviation, no ST elevation or depression. Unchanged from prior ____________________________________________  RADIOLOGY  CXR:  1. Small left pleural effusion. 2. No other active cardiopulmonary disease. ____________________________________________   PROCEDURES  Procedure(s) performed: None Procedures Critical Care performed:  None ____________________________________________   INITIAL IMPRESSION / ASSESSMENT AND PLAN / ED COURSE  80 y.o. male history of paroxysmal atrial fibrillation  on Coumadin s/p pacer, CHF with EF 20-25% (03/2015), aortic valve replacement, CAD status post CABG, diabetes, hypertension, hyperlipidemia who presents for evaluation of one episode of chest pain lasting 15 min concerning for ACS  in the setting of increasing DOE and intermittent palpitations since waking up this morning. EKG unchanged from baseline. Ddx CHF vs ACS. Low suspicion for PE on anticoagulated patient and alternative diagnosis. If INR subtherapeutic will consider CTA chest otherwise we'll hold off in case patient needs to go to cath lab to prevent double exposure to contrast and renal injury.  Clinical Course   INR 2.99. Troponin x 1 negative. BNP 1701. Will give IV lasix and admit.  Pertinent labs & imaging results that were available during my care of the patient were reviewed by me and considered in my medical decision making (see chart for details).    ____________________________________________   FINAL CLINICAL IMPRESSION(S) / ED DIAGNOSES  Final diagnoses:  Acute on chronic systolic congestive heart failure (HCC)  Chest pain, unspecified type  Paroxysmal atrial fibrillation (HCC)      NEW MEDICATIONS STARTED DURING THIS VISIT:  New Prescriptions   No medications on file     Note:  This document was prepared using Dragon voice recognition software and may include unintentional dictation errors.    Rudene Re, MD 11/28/15 1919

## 2015-11-29 ENCOUNTER — Observation Stay
Admit: 2015-11-29 | Discharge: 2015-11-29 | Disposition: A | Discharge status: Home / Self Care | Payer: Commercial Managed Care - HMO | Attending: Internal Medicine | Admitting: Internal Medicine

## 2015-11-29 DIAGNOSIS — R002 Palpitations: Secondary | ICD-10-CM | POA: Diagnosis not present

## 2015-11-29 DIAGNOSIS — I4891 Unspecified atrial fibrillation: Secondary | ICD-10-CM | POA: Diagnosis not present

## 2015-11-29 DIAGNOSIS — R079 Chest pain, unspecified: Secondary | ICD-10-CM | POA: Diagnosis not present

## 2015-11-29 DIAGNOSIS — I509 Heart failure, unspecified: Secondary | ICD-10-CM | POA: Diagnosis not present

## 2015-11-29 DIAGNOSIS — R0600 Dyspnea, unspecified: Secondary | ICD-10-CM | POA: Diagnosis not present

## 2015-11-29 LAB — CBC
HCT: 34.7 % — ABNORMAL LOW (ref 40.0–52.0)
HEMOGLOBIN: 12.1 g/dL — AB (ref 13.0–18.0)
MCH: 34 pg (ref 26.0–34.0)
MCHC: 34.9 g/dL (ref 32.0–36.0)
MCV: 97.4 fL (ref 80.0–100.0)
Platelets: 76 10*3/uL — ABNORMAL LOW (ref 150–440)
RBC: 3.56 MIL/uL — AB (ref 4.40–5.90)
RDW: 13.5 % (ref 11.5–14.5)
WBC: 6.2 10*3/uL (ref 3.8–10.6)

## 2015-11-29 LAB — PROTIME-INR
INR: 3.08
Prothrombin Time: 32.5 seconds — ABNORMAL HIGH (ref 11.4–15.2)

## 2015-11-29 LAB — BASIC METABOLIC PANEL
ANION GAP: 6 (ref 5–15)
BUN: 33 mg/dL — ABNORMAL HIGH (ref 6–20)
CALCIUM: 8.4 mg/dL — AB (ref 8.9–10.3)
CO2: 29 mmol/L (ref 22–32)
Chloride: 105 mmol/L (ref 101–111)
Creatinine, Ser: 1.24 mg/dL (ref 0.61–1.24)
GFR, EST NON AFRICAN AMERICAN: 54 mL/min — AB (ref >60)
Glucose, Bld: 88 mg/dL (ref 65–99)
Potassium: 3.5 mmol/L (ref 3.5–5.1)
Sodium: 140 mmol/L (ref 135–145)

## 2015-11-29 LAB — GLUCOSE, CAPILLARY
GLUCOSE-CAPILLARY: 82 mg/dL (ref 65–99)
Glucose-Capillary: 187 mg/dL — ABNORMAL HIGH (ref 65–99)
Glucose-Capillary: 92 mg/dL (ref 65–99)
Glucose-Capillary: 190 mg/dL — ABNORMAL HIGH (ref 65–99)

## 2015-11-29 LAB — TROPONIN I

## 2015-11-29 MED ORDER — SOTALOL HCL 120 MG PO TABS
120.0000 mg | ORAL_TABLET | Freq: Two times a day (BID) | ORAL | Status: DC
  Administered 2015-11-29 – 2015-11-30 (×2): 120 mg via ORAL
  Filled 2015-11-29 (×2): qty 1

## 2015-11-29 NOTE — Progress Notes (Signed)
Patient stable, no acute distress noted. No c/o. Family member at bedside.

## 2015-11-29 NOTE — Progress Notes (Signed)
[place] for warfarin dosing Indication: atrial fibrillation  Allergies  Allergen Reactions  . Cefdinir Other (See Comments)    nightmares  . Adhesive [Tape] Rash  . Sulfa Antibiotics Rash  . Sulfamethoxazole Hives, Itching and Rash    Patient Measurements: Height: 5\' 10"  (177.8 cm) Weight: 177 lb 0.5 oz (80.3 kg) IBW/kg (Calculated) : 73  Vital Signs: BP: 107/60 (10/16 1132) Pulse Rate: 66 (10/16 1132)  Labs:  Recent Labs  11/28/15 1719 11/28/15 2314 11/29/15 0531 11/29/15 1118  HGB 13.3  --  12.1*  --   HCT 38.3*  --  34.7*  --   PLT 89*  --  76*  --   LABPROT 31.7*  --   --  32.5*  INR 2.99  --   --  3.08  CREATININE 1.46*  --  1.24  --   TROPONINI <0.03 <0.03 <0.03 <0.03    Estimated Creatinine Clearance: 50.7 mL/min (by C-G formula based on SCr of 1.24 mg/dL).   Medical History: Past Medical History:  Diagnosis Date  . AAA (abdominal aortic aneurysm) ([place])   . Arrhythmia    atrial fibrillation  . Arthritis   . BPH (benign prostatic hyperplasia)   . Cancer ([place])    skin  . CHF (congestive heart failure) ([place])   . Coronary artery disease   . DDD (degenerative disc disease), thoracic   . Diabetes mellitus without complication ([place])   . Esophageal stricture   . GERD (gastroesophageal reflux disease)   . Heart murmur   . Hyperlipidemia   . Hypertension   . Hypothyroidism   . Myocardial infarction   . Shortness of breath dyspnea     Assessment: 80 year old male admitted for chest pain/palpitations. Pharmacy consulted to dose warfarin.  Patient takes warfarin 4mg  PO QPM at home  Date  INR Warfarin dose 10/15  2.99 4mg  10/16  3.08     Goal of Therapy:  INR 2-3 Monitor platelets by anticoagulation protocol: Yes   Plan:  INR supratherapeutic and trending up. Will hold dose tonight, recheck with AM labs.  Platelets low at 76, this appears to be chronic. Will continue to  follow.  Thula Stewart C 11/29/2015,1:29 PM

## 2015-11-29 NOTE — Progress Notes (Addendum)
[place] [facility] [place] NAME: Derrick Cochran    MR#:  [id number]  DATE OF BIRTH:  Sep 22, 1935  SUBJECTIVE:  Patient here with dyspnea on exertion and palpitations  REVIEW OF SYSTEMS:    Review of Systems  Constitutional: Negative.  Negative for chills, fever and malaise/fatigue.  HENT: Negative.  Negative for ear discharge, ear pain, hearing loss, nosebleeds and sore throat.   Eyes: Negative.  Negative for blurred vision and pain.  Respiratory: Positive for shortness of breath. Negative for cough, hemoptysis and wheezing.   Cardiovascular: Negative.  Negative for chest pain, palpitations and leg swelling.  Gastrointestinal: Negative.  Negative for abdominal pain, blood in stool, diarrhea, nausea and vomiting.  Genitourinary: Negative.  Negative for dysuria.  Musculoskeletal: Negative.  Negative for back pain.  Skin: Negative.   Neurological: Negative for dizziness, tremors, speech change, focal weakness, seizures and headaches.  Endo/Heme/Allergies: Negative.  Does not bruise/bleed easily.  Psychiatric/Behavioral: Negative.  Negative for depression, hallucinations and suicidal ideas.    Tolerating Diet:yes       DRUG ALLERGIES:   Allergies  Allergen Reactions  . Cefdinir Other (See Comments)    nightmares  . Adhesive [Tape] Rash  . Sulfa Antibiotics Rash  . Sulfamethoxazole Hives, Itching and Rash    VITALS:  Blood pressure 107/60, pulse 66, temperature 97.4 F (36.3 C), temperature source Oral, resp. rate 17, height 5\' 10"  (1.778 m), weight 80.3 kg (177 lb 0.5 oz), SpO2 95 %.  PHYSICAL EXAMINATION:   Physical Exam  Constitutional: He is oriented to person, place, and time and well-developed, well-nourished, and in no distress. No distress.  HENT:  Head: Normocephalic.  Eyes: No scleral icterus.  Neck: Normal range of motion. Neck supple. No JVD present. No tracheal deviation present.  Cardiovascular: Normal rate and regular  rhythm.  Exam reveals no gallop and no friction rub.   Murmur heard. Irr,irr  Pulmonary/Chest: Effort normal and breath sounds normal. No respiratory distress. He has no wheezes. He has no rales. He exhibits no tenderness.  Abdominal: Soft. Bowel sounds are normal. He exhibits no distension and no mass. There is no tenderness. There is no rebound and no guarding.  Musculoskeletal: Normal range of motion. He exhibits no edema.  Neurological: He is alert and oriented to person, place, and time.  Skin: Skin is warm. No rash noted. No erythema.  Psychiatric: Affect and judgment normal.      LABORATORY PANEL:   CBC  Recent Labs Lab 11/29/15 0531  WBC 6.2  HGB 12.1*  HCT 34.7*  PLT 76*   ------------------------------------------------------------------------------------------------------------------  Chemistries   Recent Labs Lab 11/28/15 1719 11/29/15 0531  NA 139 140  K 4.5 3.5  CL 106 105  CO2 28 29  GLUCOSE 100* 88  BUN 31* 33*  CREATININE 1.46* 1.24  CALCIUM 9.0 8.4*  MG 1.4*  --    ------------------------------------------------------------------------------------------------------------------  Cardiac Enzymes  Recent Labs Lab 11/28/15 2314 11/29/15 0531 11/29/15 1118  TROPONINI <0.03 <0.03 <0.03   ------------------------------------------------------------------------------------------------------------------  RADIOLOGY:  Dg Chest 2 View  Result Date: 11/28/2015 CLINICAL DATA:  Initial evaluation for acute chest pain with shortness of breath. EXAM: CHEST  2 VIEW COMPARISON:  Prior radiograph from 04/11/2015. FINDINGS: Cardiomegaly is stable. Median sternotomy wires with underlying valvular prosthesis in sequela prior CABG noted. Left-sided pacemaker/ AICD. Mediastinal silhouette within normal limits. Aortic atherosclerosis noted. Lungs are normally inflated. No focal infiltrates. No pulmonary edema. Small left pleural effusion. No pneumothorax. No  acute  osseous abnormality. IMPRESSION: 1. Small left pleural effusion. 2. No other active cardiopulmonary disease. Electronically Signed   By: Jeannine Boga M.D.   On: 11/28/2015 17:59     ASSESSMENT AND PLAN:   80 year old male with a history of atrial fibrillation, permanent pacemaker and cardiomyopathy with ejection fraction of 20%-25% who presents with palpitations/chest pain and dyspnea.   1. Chest pain with palpitations: Patient has ruled out for ACS. I'm suspecting patient may have periods of tachycardia causing palpitations. Cardiology consult pending  2. Acute on chronic systolic heart failure: Continue IV Lasix and follow up with cardiology consult. Repeat echocardiogram. Monitor I/o and daily weight. Continue metoprolol and ACE inhibitor 3. Dyspnea on exertion: I feel the patient's symptoms are more than likely due to his known cardiomyopathy and atrial fibrillation.  4. Diabetes: Continue outpatient regimen with the exception of metformin Continue sliding scale insulin and ADA diet  5. Hypothyroid: Continue Synthroid  6. Chronic atrial fibrillation: Continue metoprolol and sotalol. Pharmacy following Coumadin level. 7. BPH: Continue Flomax  8. ASCVD: Continue Zocor, beta blocker and ACE inhibitor along with aspirin Management plans discussed with the patient and he is in agreement.  CODE STATUS: full  TOTAL TIME TAKING CARE OF THIS PATIENT: 30 minutes.   Rounded with nursing  POSSIBLE D/C tomorrow, DEPENDING ON CLINICAL CONDITION.   Nyiah Pianka M.D on 11/29/2015 at 12:19 PM  Between 7am to 6pm - Pager - [phone] After 6pm go to [email or web address] - password [email or web address] [facility]  Office  [phone]  CC: Primary care physician; [name], MD  Note: This dictation was prepared with Dragon dictation along with smaller phrase technology. Any transcriptional errors that result from this process are unintentional.

## 2015-11-29 NOTE — Consult Note (Signed)
[place]  CARDIOLOGY CONSULT NOTE  Patient ID: NGHIA MASSER MRN: [id number] DOB/AGE: [birth year] 78 y.o.  Admit date: 11/28/2015 Referring Physician Dr. Benjie Karvonen Primary Physician Dr. Doy Hutching Primary Cardiologist Dr. Saralyn Pilar Reason for Consultation palpitations  HPI: Pt is a 80 yo male with history of cad with a cabg x 4 with a lima to lad, svg to d1, om1 and svg to pda in 1995. Cardiac cath in 10/15 revealed patent lima, svg to d1, om1 and 60% stenosis in pda. He also has history of as s/p tavr in 04/01/14 and a dual chamber pacemaker in 12/16 for sss. He was admitted after developing increasing palpitaitons and rapid hear rate. EKG in the hospital revealed afib with variable vr and intermittantly paced rhythm. Most recent ekg in the office showed predominately nsr with intermittant afib. He is on warfarin for anticoagulation for chads vasc score of 5. He is on metoprolol and sotalol for rhythm and rate control.  Echo done 2/17 revealed ef of 20-25% with stable aortic prosthesis and mild mr and tr.  Review of Systems  Constitutional: Negative.   HENT: Negative.   Eyes: Negative.   Respiratory: Positive for shortness of breath.   Cardiovascular: Positive for chest pain and palpitations.  Gastrointestinal: Negative.   Genitourinary: Negative.   Musculoskeletal: Negative.   Skin: Negative.   Neurological: Negative.   Endo/Heme/Allergies: Negative.   Psychiatric/Behavioral: Negative.     Past Medical History:  Diagnosis Date  . AAA (abdominal aortic aneurysm) ([place])   . Arrhythmia    atrial fibrillation  . Arthritis   . BPH (benign prostatic hyperplasia)   . Cancer ([place])    skin  . CHF (congestive heart failure) ([place])   . Coronary artery disease   . DDD (degenerative disc disease), thoracic   . Diabetes mellitus without complication ([place])   . Esophageal stricture   . GERD (gastroesophageal reflux disease)   . Heart murmur   .  Hyperlipidemia   . Hypertension   . Hypothyroidism   . Myocardial infarction   . Shortness of breath dyspnea     Family History  Problem Relation Age of Onset  . Cancer Mother   . Cancer Father   . Kidney disease Sister   . Heart failure Brother     Social History   Social History  . Marital status: Married    Spouse name: N/A  . Number of children: N/A  . Years of education: N/A   Occupational History  . Not on file.   Social History Main Topics  . Smoking status: Former Smoker    Packs/day: 0.50    Years: 15.00    Types: Cigarettes    Quit date: 04/22/1985  . Smokeless tobacco: Former Systems developer    Types: Chew  . Alcohol use No  . Drug use: No  . Sexual activity: Not on file   Other Topics Concern  . Not on file   Social History Narrative  . No narrative on file    Past Surgical History:  Procedure Laterality Date  . AORTIC VALVE REPLACEMENT  1995   Bioprosthetic - [facility]  . APPENDECTOMY    . CARDIAC CATHETERIZATION  1994 x 2 with PCTA of RCA  . CARDIAC VALVE REPLACEMENT     Bovine transcatheter heart valve  . COLONOSCOPY  2015  . CORONARY ARTERY BYPASS GRAFT  1995   x 4 Vessels - [facility]  . ELECTROPHYSIOLOGIC STUDY N/A 08/06/2014   Procedure: Cardioversion;  Surgeon: [name], MD;  Location: [facility];  Service: Cardiovascular;  Laterality: N/A;  . ELECTROPHYSIOLOGIC STUDY N/A 06/18/2014   Procedure: CARDIOVERSION;  Surgeon: [name], MD;  Location: [facility];  Service: Cardiovascular;  Laterality: N/A;  . ESOPHAGOGASTRODUODENOSCOPY  2014  . GREAT TOE ARTHRODESIS, INTERPHALANGEAL JOINT Left   . HERNIA REPAIR Left    Inguinal  . PACEMAKER INSERTION N/A 07/02/2014   Procedure: INSERTION PACEMAKER/DUAL CHAMBER  INITIAL IMPLANT;  Surgeon: [name], MD;  Location: [facility];  Service: Cardiovascular;  Laterality: N/A;     Prescriptions Prior to Admission  Medication Sig Dispense Refill Last Dose  . acetaminophen (TYLENOL) 500  MG tablet Take 500 mg by mouth every 6 (six) hours as needed for mild pain, moderate pain or fever.    prn at prn  . aspirin EC 81 MG EC tablet Take 1 tablet (81 mg total) by mouth daily.   Past Week at Unknown time  . celecoxib (CELEBREX) 200 MG capsule Take 200 mg by mouth daily.   11/27/2015 at 1300  . cyanocobalamin 1000 MCG tablet Take 1,000 mcg by mouth daily.   Past Month at Unknown time  . enalapril (VASOTEC) 5 MG tablet Take 5 mg by mouth 2 (two) times daily.    11/28/2015 at 1100  . furosemide (LASIX) 20 MG tablet Take 20 mg by mouth every morning.    11/28/2015 at 1100  . gabapentin (NEURONTIN) 300 MG capsule Take 300 mg by mouth daily.    11/27/2015 at Unknown time  . glipiZIDE (GLUCOTROL XL) 5 MG 24 hr tablet Take 5 mg by mouth every morning.   11/28/2015 at Unknown time  . levothyroxine (SYNTHROID, LEVOTHROID) 137 MCG tablet Take 137 mcg by mouth daily before breakfast.    11/28/2015 at Unknown time  . loratadine (CLARITIN) 10 MG tablet Take 1 tablet by mouth daily.   11/28/2015 at Unknown time  . metFORMIN (GLUCOPHAGE) 500 MG tablet Take 500 mg by mouth 2 (two) times daily.    11/28/2015 at 1100  . metoprolol succinate (TOPROL-XL) 25 MG 24 hr tablet Take 25 mg by mouth daily.   11/28/2015 at 1100  . omeprazole (PRILOSEC) 20 MG capsule Take 20 mg by mouth every morning.    11/28/2015 at Unknown time  . simvastatin (ZOCOR) 40 MG tablet Take 40 mg by mouth every evening.    11/27/2015 at Unknown time  . sotalol (BETAPACE) 80 MG tablet Take 1 tablet (80 mg total) by mouth 2 (two) times daily. 60 tablet 5 11/28/2015 at 1100  . tamsulosin (FLOMAX) 0.4 MG CAPS capsule Take 0.4 mg by mouth every morning.    11/27/2015 at pm  . temazepam (RESTORIL) 15 MG capsule Take 15 mg by mouth at bedtime.    11/27/2015 at pm  . warfarin (COUMADIN) 1 MG tablet Take 1 mg by mouth every evening. Pt takes with 3mg  tablet for a total dose of 4mg .   11/27/2015 at 1900  . warfarin (COUMADIN) 3 MG tablet Take 3  mg by mouth every evening. Pt takes with 1mg  tablet for a total dose of 4mg .   11/27/2015 at 1900  . nitroGLYCERIN (NITROSTAT) 0.4 MG SL tablet Place 0.4 mg under the tongue every 5 (five) minutes as needed for chest pain.   Taking    Physical Exam: Blood pressure 107/60, pulse 66, temperature 97.4 F (36.3 C), temperature source Oral, resp. rate 17, height 5\' 10"  (1.778 m), weight 80.3 kg (177 lb 0.5 oz),  SpO2 95 %.   Wt Readings from Last 1 Encounters:  11/29/15 80.3 kg (177 lb 0.5 oz)     General appearance: alert and cooperative Resp: clear to auscultation bilaterally Cardio: irregularly irregular rhythm GI: soft, non-tender; bowel sounds normal; no masses,  no organomegaly Extremities: extremities normal, atraumatic, no cyanosis or edema Pulses: 2+ and symmetric Neurologic: Grossly normal  Labs:   Lab Results  Component Value Date   WBC 6.2 11/29/2015   HGB 12.1 (L) 11/29/2015   HCT 34.7 (L) 11/29/2015   MCV 97.4 11/29/2015   PLT 76 (L) 11/29/2015    Recent Labs Lab 11/29/15 0531  NA 140  K 3.5  CL 105  CO2 29  BUN 33*  CREATININE 1.24  CALCIUM 8.4*  GLUCOSE 88   Lab Results  Component Value Date   TROPONINI <0.03 11/29/2015      Radiology: CXR-small left pleural effusion with no active cardiopulmonary disease EKG: afib with variable response and intermittent paced beats.   ASSESSMENT AND PLAN:  Pt is a 80 yo male with history of cad with a cabg x 4 with a lima to lad, svg to d1, om1 and svg to pda in 1995. Cardiac cath in 10/15 revealed patent lima, svg to d1, om1 and 60% stenosis in pda. He also has history of as s/p tavr in 04/01/14 and a dual chamber pacemaker in 12/16 for sss  Admitted with palpitaitons and sob. Appears to have reverted to afib with variable vr. Is on metoprolol for rate control and sotalol for rhythm control. Symptoms are likely secondary to his afib. Will increase sotalol to 120 bid and continue with metoprolol at current dose. Continue  with warfarin with inr goal of 2-3. Will follow rate. May need to increase metoprolol also to control rate.  Signed: Teodoro Spray MD, [facility] 11/29/2015, 1:46 PM

## 2015-11-30 DIAGNOSIS — R002 Palpitations: Secondary | ICD-10-CM | POA: Diagnosis not present

## 2015-11-30 DIAGNOSIS — I4891 Unspecified atrial fibrillation: Secondary | ICD-10-CM | POA: Diagnosis not present

## 2015-11-30 DIAGNOSIS — I509 Heart failure, unspecified: Secondary | ICD-10-CM | POA: Diagnosis not present

## 2015-11-30 DIAGNOSIS — R0600 Dyspnea, unspecified: Secondary | ICD-10-CM | POA: Diagnosis not present

## 2015-11-30 DIAGNOSIS — R079 Chest pain, unspecified: Secondary | ICD-10-CM | POA: Diagnosis not present

## 2015-11-30 LAB — GLUCOSE, CAPILLARY
GLUCOSE-CAPILLARY: 85 mg/dL (ref 65–99)
GLUCOSE-CAPILLARY: 189 mg/dL — AB (ref 65–99)

## 2015-11-30 LAB — HEMOGLOBIN A1C
Hgb A1c MFr Bld: 5.9 % — ABNORMAL HIGH (ref 4.8–5.6)
MEAN PLASMA GLUCOSE: 123 mg/dL

## 2015-11-30 LAB — PROTIME-INR
INR: 3.38
PROTHROMBIN TIME: 35 s — AB (ref 11.4–15.2)

## 2015-11-30 LAB — ECHOCARDIOGRAM COMPLETE
HEIGHTINCHES: 70 in
WEIGHTICAEL: 2832.47 [oz_av]

## 2015-11-30 MED ORDER — ENALAPRIL MALEATE 5 MG PO TABS: 5.0000 mg | ORAL_TABLET | Freq: Every day | ORAL | 0 refills | Status: DC

## 2015-11-30 MED ORDER — SOTALOL HCL 120 MG PO TABS: 120.0000 mg | ORAL_TABLET | Freq: Two times a day (BID) | ORAL | 0 refills | Status: DC

## 2015-11-30 NOTE — Progress Notes (Signed)
2ANTICOAGULATION CONSULT NOTE - [place] for warfarin dosing Indication: atrial fibrillation  Allergies  Allergen Reactions  . Cefdinir Other (See Comments)    nightmares  . Adhesive [Tape] Rash  . Sulfa Antibiotics Rash  . Sulfamethoxazole Hives, Itching and Rash    Patient Measurements: Height: 5\' 10"  (177.8 cm) Weight: 172 lb 7 oz (78.2 kg) IBW/kg (Calculated) : 73  Vital Signs: Temp: 98.2 F (36.8 C) (10/17 0900) Temp Source: Oral (10/17 0900) BP: 111/65 (10/17 0900) Pulse Rate: 68 (10/17 0900)  Labs:  Recent Labs  11/28/15 1719 11/28/15 2314 11/29/15 0531 11/29/15 1118 11/30/15 0434  HGB 13.3  --  12.1*  --   --   HCT 38.3*  --  34.7*  --   --   PLT 89*  --  76*  --   --   LABPROT 31.7*  --   --  32.5* 35.0*  INR 2.99  --   --  3.08 3.38  CREATININE 1.46*  --  1.24  --   --   TROPONINI <0.03 <0.03 <0.03 <0.03  --     Estimated Creatinine Clearance: 50.7 mL/min (by C-G formula based on SCr of 1.24 mg/dL).   Medical History: Past Medical History:  Diagnosis Date  . AAA (abdominal aortic aneurysm) ([place])   . Arrhythmia    atrial fibrillation  . Arthritis   . BPH (benign prostatic hyperplasia)   . Cancer ([place])    skin  . CHF (congestive heart failure) ([place])   . Coronary artery disease   . DDD (degenerative disc disease), thoracic   . Diabetes mellitus without complication ([place])   . Esophageal stricture   . GERD (gastroesophageal reflux disease)   . Heart murmur   . Hyperlipidemia   . Hypertension   . Hypothyroidism   . Myocardial infarction   . Shortness of breath dyspnea     Assessment: 80 year old male admitted for chest pain/palpitations. Pharmacy consulted to dose warfarin.  Patient takes warfarin 4mg  PO QPM at home  Date  INR Warfarin dose 10/15  2.99 4mg  10/16  3.08    HELD  10/17               3.38    Hold    Goal of Therapy:  INR 2-3 Monitor platelets by anticoagulation protocol: Yes   Plan:  INR  supratherapeutic and trending up. Will hold dose tonight, recheck with AM labs.  Will continue to follow.  Jerzie Bieri D 11/30/2015,10:53 AM

## 2015-11-30 NOTE — Discharge Summary (Signed)
[facility] [place] NAME: Derrick Cochran    MR#:  [id number]  DATE OF BIRTH:  05-28-1935  DATE OF ADMISSION:  11/28/2015 ADMITTING PHYSICIAN: Theodoro Grist, MD  DATE OF DISCHARGE: 11/30/2015  PRIMARY CARE PHYSICIAN: SPARKS,JEFFREY D, MD    ADMISSION DIAGNOSIS:  Paroxysmal atrial fibrillation (HCC) [I48.0] Acute on chronic systolic congestive heart failure (HCC) [I50.23] Chest pain, unspecified type [R07.9]  DISCHARGE DIAGNOSIS:  Active Problems:   Palpitations   Dyspnea   Pleural effusion   Chest pain   SECONDARY DIAGNOSIS:   Past Medical History:  Diagnosis Date  . AAA (abdominal aortic aneurysm) ([place])   . Arrhythmia    atrial fibrillation  . Arthritis   . BPH (benign prostatic hyperplasia)   . Cancer ([place])    skin  . CHF (congestive heart failure) ([place])   . Coronary artery disease   . DDD (degenerative disc disease), thoracic   . Diabetes mellitus without complication ([place])   . Esophageal stricture   . GERD (gastroesophageal reflux disease)   . Heart murmur   . Hyperlipidemia   . Hypertension   . Hypothyroidism   . Myocardial infarction   . Shortness of breath dyspnea     HOSPITAL COURSE:   80 year old male with a history of atrial fibrillation, permanent pacemaker and cardiomyopathy with ejection fraction of 20%-25% who presents with palpitations/chest pain and dyspnea.   1. Chest pain with palpitations: Patient has ruled out for ACS. Patient had periods of tachycardia. Sotalol was increased. He was evaluated by cardiology on the hospital. His symptoms have improved.  2. Acute on chronic systolic heart failure ejection fraction 30-35%: He is now euvolemic. Continue metoprolol and ACE inhibitor. Ejection fraction has actually improved since last Echo in February.   3. Dyspnea on exertion: This was due to a combination of problem #2 as well as atrial fibrillation.   4. Diabetes: Patient may continue his  outpatient regimen and ADA diet 5. Hypothyroid: Continue Synthroid  6. Chronic atrial fibrillation: Continue metoprolol and increased dose of sotalol. He will continue Coumadin. INR is 2.9 on discharge   7. BPH: Continue Flomax  8. ASCVD: Continue Zocor, beta blocker and ACE inhibitor along with aspirin  Management plans discussed with the patient and he is in agreement.  Rounded with nursing and discussed with Dr.Fath  DISCHARGE CONDITIONS AND DIET:   Stable daibetci  CONSULTS OBTAINED:  Treatment Team:  Teodoro Spray, MD  DRUG ALLERGIES:   Allergies  Allergen Reactions  . Cefdinir Other (See Comments)    nightmares  . Adhesive [Tape] Rash  . Sulfa Antibiotics Rash  . Sulfamethoxazole Hives, Itching and Rash    DISCHARGE MEDICATIONS:   Current Discharge Medication List    CONTINUE these medications which have CHANGED   Details  enalapril (VASOTEC) 5 MG tablet Take 1 tablet (5 mg total) by mouth daily. Qty: 30 tablet, Refills: 0    sotalol (BETAPACE) 120 MG tablet Take 1 tablet (120 mg total) by mouth 2 (two) times daily. Qty: 60 tablet, Refills: 0      CONTINUE these medications which have NOT CHANGED   Details  acetaminophen (TYLENOL) 500 MG tablet Take 500 mg by mouth every 6 (six) hours as needed for mild pain, moderate pain or fever.     aspirin EC 81 MG EC tablet Take 1 tablet (81 mg total) by mouth daily.    celecoxib (CELEBREX) 200 MG capsule Take 200 mg by mouth daily.  cyanocobalamin 1000 MCG tablet Take 1,000 mcg by mouth daily.    furosemide (LASIX) 20 MG tablet Take 20 mg by mouth every morning.     gabapentin (NEURONTIN) 300 MG capsule Take 300 mg by mouth daily.     glipiZIDE (GLUCOTROL XL) 5 MG 24 hr tablet Take 5 mg by mouth every morning.    levothyroxine (SYNTHROID, LEVOTHROID) 137 MCG tablet Take 137 mcg by mouth daily before breakfast.     loratadine (CLARITIN) 10 MG tablet Take 1 tablet by mouth daily.    metFORMIN  (GLUCOPHAGE) 500 MG tablet Take 500 mg by mouth 2 (two) times daily.     metoprolol succinate (TOPROL-XL) 25 MG 24 hr tablet Take 25 mg by mouth daily.    omeprazole (PRILOSEC) 20 MG capsule Take 20 mg by mouth every morning.     simvastatin (ZOCOR) 40 MG tablet Take 40 mg by mouth every evening.     tamsulosin (FLOMAX) 0.4 MG CAPS capsule Take 0.4 mg by mouth every morning.     temazepam (RESTORIL) 15 MG capsule Take 15 mg by mouth at bedtime.     !! warfarin (COUMADIN) 1 MG tablet Take 1 mg by mouth every evening. Pt takes with 3mg  tablet for a total dose of 4mg .    !! warfarin (COUMADIN) 3 MG tablet Take 3 mg by mouth every evening. Pt takes with 1mg  tablet for a total dose of 4mg .    nitroGLYCERIN (NITROSTAT) 0.4 MG SL tablet Place 0.4 mg under the tongue every 5 (five) minutes as needed for chest pain.     !! - Potential duplicate medications found. Please discuss with provider.            Today   CHIEF COMPLAINT:  Doing well has some PVCs on tele   VITAL SIGNS:  Blood pressure 111/65, pulse 68, temperature 98.2 F (36.8 C), temperature source Oral, resp. rate 16, height 5\' 10"  (1.778 m), weight 78.2 kg (172 lb 7 oz), SpO2 96 %.   REVIEW OF SYSTEMS:  Review of Systems  Constitutional: Negative.  Negative for chills, fever and malaise/fatigue.  HENT: Negative.  Negative for ear discharge, ear pain, hearing loss, nosebleeds and sore throat.   Eyes: Negative.  Negative for blurred vision and pain.  Respiratory: Negative.  Negative for cough, hemoptysis, shortness of breath and wheezing.   Cardiovascular: Negative.  Negative for chest pain, palpitations and leg swelling.  Gastrointestinal: Negative.  Negative for abdominal pain, blood in stool, diarrhea, nausea and vomiting.  Genitourinary: Negative.  Negative for dysuria.  Musculoskeletal: Negative.  Negative for back pain.  Skin: Negative.   Neurological: Negative for dizziness, tremors, speech change, focal  weakness, seizures and headaches.  Endo/Heme/Allergies: Negative.  Does not bruise/bleed easily.  Psychiatric/Behavioral: Negative.  Negative for depression, hallucinations and suicidal ideas.     PHYSICAL EXAMINATION:  GENERAL:  80 y.o.-year-old patient lying in the bed with no acute distress.  NECK:  Supple, no jugular venous distention. No thyroid enlargement, no tenderness.  LUNGS: Normal breath sounds bilaterally, no wheezing, rales,rhonchi  No use of accessory muscles of respiration.  CARDIOVASCULAR:irr, irr . No murmurs, rubs, or gallops.  ABDOMEN: Soft, non-tender, non-distended. Bowel sounds present. No organomegaly or mass.  EXTREMITIES: No pedal edema, cyanosis, or clubbing.  PSYCHIATRIC: The patient is alert and oriented x 3.  SKIN: No obvious rash, lesion, or ulcer.   DATA REVIEW:   CBC  Recent Labs Lab 11/29/15 0531  WBC 6.2  HGB 12.1*  HCT 34.7*  PLT 76*    Chemistries   Recent Labs Lab 11/28/15 1719 11/29/15 0531  NA 139 140  K 4.5 3.5  CL 106 105  CO2 28 29  GLUCOSE 100* 88  BUN 31* 33*  CREATININE 1.46* 1.24  CALCIUM 9.0 8.4*  MG 1.4*  --     Cardiac Enzymes  Recent Labs Lab 11/28/15 2314 11/29/15 0531 11/29/15 1118  TROPONINI <0.03 <0.03 <0.03    Microbiology Results  @[email or web address] @  RADIOLOGY:  Dg Chest 2 View  Result Date: 11/28/2015 CLINICAL DATA:  Initial evaluation for acute chest pain with shortness of breath. EXAM: CHEST  2 VIEW COMPARISON:  Prior radiograph from 04/11/2015. FINDINGS: Cardiomegaly is stable. Median sternotomy wires with underlying valvular prosthesis in sequela prior CABG noted. Left-sided pacemaker/ AICD. Mediastinal silhouette within normal limits. Aortic atherosclerosis noted. Lungs are normally inflated. No focal infiltrates. No pulmonary edema. Small left pleural effusion. No pneumothorax. No acute osseous abnormality. IMPRESSION: 1. Small left pleural effusion. 2. No other active cardiopulmonary disease.  Electronically Signed   By: Jeannine Boga M.D.   On: 11/28/2015 17:59      Management plans discussed with the patient and he is in agreement. Stable for discharge home  Patient should follow up with pcp cardiology  Rounded with nursing and d/w dr Ubaldo Glassing CODE STATUS:     Code Status Orders        Start     Ordered   11/28/15 2036  Full code  Continuous     11/28/15 2035    Code Status History    Date Active Date Inactive Code Status Order ID Comments User Context   04/11/2015 10:22 AM 04/13/2015  4:03 PM Full Code [id number]  Dustin Flock, MD ED   07/02/2014  3:47 PM 07/04/2014  1:52 PM Full Code [id number]  [name], MD Inpatient    Advance Directive Documentation   Flowsheet Row Most Recent Value  Type of Advance Directive  Healthcare Power of Attorney, Living will  Pre-existing out of facility DNR order (yellow form or pink MOST form)  No data  "MOST" Form in Place?  No data      TOTAL TIME TAKING CARE OF THIS PATIENT: 38 minutes.    Note: This dictation was prepared with Dragon dictation along with smaller phrase technology. Any transcriptional errors that result from this process are unintentional.  Kendarrius Tanzi M.D on 11/30/2015 at 11:40 AM  Between 7am to 6pm - Pager - [phone] After 6pm go to [email or web address] - password [email or web address]  Office  [phone]  CC: Primary care physician; [name], MD

## 2015-11-30 NOTE — Progress Notes (Signed)
Patient discharged home as ordered,instructions explained and well understood,follow up appointments made,medication changes explained,hemodynamics within normal limits,escorted by spouse and volunteer via wheel chair.

## 2015-11-30 NOTE — Progress Notes (Signed)
Patient had a run of vtach,Dr.Mody present on the floor and notified,Dr.Fath is rounding at this time and will see patient

## 2015-12-07 DIAGNOSIS — R351 Nocturia: Secondary | ICD-10-CM | POA: Diagnosis not present

## 2015-12-07 DIAGNOSIS — N401 Enlarged prostate with lower urinary tract symptoms: Secondary | ICD-10-CM | POA: Diagnosis not present

## 2015-12-07 DIAGNOSIS — R0602 Shortness of breath: Secondary | ICD-10-CM | POA: Diagnosis not present

## 2015-12-07 DIAGNOSIS — R531 Weakness: Secondary | ICD-10-CM | POA: Diagnosis not present

## 2015-12-07 DIAGNOSIS — I5022 Chronic systolic (congestive) heart failure: Secondary | ICD-10-CM | POA: Diagnosis not present

## 2015-12-07 DIAGNOSIS — Z79899 Other long term (current) drug therapy: Secondary | ICD-10-CM | POA: Diagnosis not present

## 2015-12-07 DIAGNOSIS — I482 Chronic atrial fibrillation: Secondary | ICD-10-CM | POA: Diagnosis not present

## 2015-12-09 DIAGNOSIS — I495 Sick sinus syndrome: Secondary | ICD-10-CM | POA: Diagnosis not present

## 2015-12-09 DIAGNOSIS — I2119 ST elevation (STEMI) myocardial infarction involving other coronary artery of inferior wall: Secondary | ICD-10-CM | POA: Diagnosis not present

## 2015-12-09 DIAGNOSIS — I350 Nonrheumatic aortic (valve) stenosis: Secondary | ICD-10-CM | POA: Diagnosis not present

## 2015-12-09 DIAGNOSIS — I482 Chronic atrial fibrillation: Secondary | ICD-10-CM | POA: Diagnosis not present

## 2015-12-09 DIAGNOSIS — E119 Type 2 diabetes mellitus without complications: Secondary | ICD-10-CM | POA: Diagnosis not present

## 2015-12-09 DIAGNOSIS — I214 Non-ST elevation (NSTEMI) myocardial infarction: Secondary | ICD-10-CM | POA: Diagnosis not present

## 2015-12-09 DIAGNOSIS — I4891 Unspecified atrial fibrillation: Secondary | ICD-10-CM | POA: Diagnosis not present

## 2015-12-09 DIAGNOSIS — I5022 Chronic systolic (congestive) heart failure: Secondary | ICD-10-CM | POA: Diagnosis not present

## 2015-12-09 DIAGNOSIS — I10 Essential (primary) hypertension: Secondary | ICD-10-CM | POA: Diagnosis not present

## 2015-12-21 DIAGNOSIS — I480 Paroxysmal atrial fibrillation: Secondary | ICD-10-CM | POA: Diagnosis not present

## 2015-12-27 ENCOUNTER — Encounter: Payer: Self-pay | Admitting: Family

## 2015-12-27 ENCOUNTER — Ambulatory Visit: Payer: Commercial Managed Care - HMO | Attending: Family | Admitting: Family

## 2015-12-27 VITALS — BP 132/60 | HR 84 | Resp 18 | Ht 70.0 in | Wt 177.0 lb

## 2015-12-27 DIAGNOSIS — Z7984 Long term (current) use of oral hypoglycemic drugs: Secondary | ICD-10-CM | POA: Insufficient documentation

## 2015-12-27 DIAGNOSIS — I4891 Unspecified atrial fibrillation: Secondary | ICD-10-CM | POA: Insufficient documentation

## 2015-12-27 DIAGNOSIS — Z951 Presence of aortocoronary bypass graft: Secondary | ICD-10-CM | POA: Diagnosis not present

## 2015-12-27 DIAGNOSIS — I482 Chronic atrial fibrillation, unspecified: Secondary | ICD-10-CM

## 2015-12-27 DIAGNOSIS — I252 Old myocardial infarction: Secondary | ICD-10-CM | POA: Insufficient documentation

## 2015-12-27 DIAGNOSIS — Z7901 Long term (current) use of anticoagulants: Secondary | ICD-10-CM | POA: Diagnosis not present

## 2015-12-27 DIAGNOSIS — E119 Type 2 diabetes mellitus without complications: Secondary | ICD-10-CM | POA: Diagnosis not present

## 2015-12-27 DIAGNOSIS — I2510 Atherosclerotic heart disease of native coronary artery without angina pectoris: Secondary | ICD-10-CM | POA: Diagnosis not present

## 2015-12-27 DIAGNOSIS — Z87891 Personal history of nicotine dependence: Secondary | ICD-10-CM | POA: Diagnosis not present

## 2015-12-27 DIAGNOSIS — I5022 Chronic systolic (congestive) heart failure: Secondary | ICD-10-CM | POA: Insufficient documentation

## 2015-12-27 DIAGNOSIS — I714 Abdominal aortic aneurysm, without rupture: Secondary | ICD-10-CM | POA: Diagnosis not present

## 2015-12-27 DIAGNOSIS — K219 Gastro-esophageal reflux disease without esophagitis: Secondary | ICD-10-CM | POA: Diagnosis not present

## 2015-12-27 DIAGNOSIS — Z952 Presence of prosthetic heart valve: Secondary | ICD-10-CM | POA: Insufficient documentation

## 2015-12-27 DIAGNOSIS — M1990 Unspecified osteoarthritis, unspecified site: Secondary | ICD-10-CM | POA: Insufficient documentation

## 2015-12-27 DIAGNOSIS — Z950 Presence of cardiac pacemaker: Secondary | ICD-10-CM | POA: Diagnosis not present

## 2015-12-27 DIAGNOSIS — I10 Essential (primary) hypertension: Secondary | ICD-10-CM

## 2015-12-27 DIAGNOSIS — I110 Hypertensive heart disease with heart failure: Secondary | ICD-10-CM | POA: Insufficient documentation

## 2015-12-27 DIAGNOSIS — N400 Enlarged prostate without lower urinary tract symptoms: Secondary | ICD-10-CM | POA: Diagnosis not present

## 2015-12-27 DIAGNOSIS — E039 Hypothyroidism, unspecified: Secondary | ICD-10-CM | POA: Insufficient documentation

## 2015-12-27 DIAGNOSIS — Z7982 Long term (current) use of aspirin: Secondary | ICD-10-CM | POA: Insufficient documentation

## 2015-12-27 DIAGNOSIS — E785 Hyperlipidemia, unspecified: Secondary | ICD-10-CM | POA: Diagnosis not present

## 2015-12-27 NOTE — Patient Instructions (Signed)
Continue weighing daily and call for an overnight weight gain of > 2 pounds or a weekly weight gain of >5 pounds. 

## 2015-12-27 NOTE — Progress Notes (Signed)
Patient ID: Derrick Cochran, male    DOB: 1935/09/13, 80 y.o.   MRN: [id number]  HPI  Derrick Cochran is a 80 y/o male with a history of MI, hypothyroidism, HTN, hyperlipidemia, GERD, DM, BPH, atrial fibrillation, AAA, remote tobacco use and chronic heart failure.   Last echo was done 11/29/15 which showed an EF of 30-35% with bioprosthesis valve present. Also has mild Derrick. This is an improvement from his EF of 20-25% on 04/11/15. Last catheterization was done 12/04/13. CABG done 1995.  Last admission was on 11/28/15 for atrial fibrillation along with HF exacerbation. Sotalol was increased and cardiology consult was done and he was discharged after 2 days. Previous admission ws 04/11/15 with HF exacerbation and atrial fibrillation. Treated with IV diuretics and cardiology consult was obtained.   He presents today for a follow-up visit with fatigue and shortness of breath upon moderate exertion. Symptoms improve upon rest. Denies any swelling in his legs or abdomen. Continues to weigh daily and says that his weight has declined due to a decreased appetite.   Past Medical History:  Diagnosis Date  . AAA (abdominal aortic aneurysm) ([place])   . Arrhythmia    atrial fibrillation  . Arthritis   . BPH (benign prostatic hyperplasia)   . Cancer ([place])    skin  . CHF (congestive heart failure) ([place])   . Coronary artery disease   . DDD (degenerative disc disease), thoracic   . Diabetes mellitus without complication ([place])   . Esophageal stricture   . GERD (gastroesophageal reflux disease)   . Heart murmur   . Hyperlipidemia   . Hypertension   . Hypothyroidism   . Myocardial infarction   . Shortness of breath dyspnea     Past Surgical History:  Procedure Laterality Date  . AORTIC VALVE REPLACEMENT  1995   Bioprosthetic - [place]  . APPENDECTOMY    . CARDIAC CATHETERIZATION  1994 x 2 with PCTA of RCA  . CARDIAC VALVE REPLACEMENT     Bovine transcatheter heart valve  . COLONOSCOPY  2015  . CORONARY  ARTERY BYPASS GRAFT  1995   x 4 Vessels - [facility]  . ELECTROPHYSIOLOGIC STUDY N/A 08/06/2014   Procedure: Cardioversion;  Surgeon: [name], MD;  Location: [facility];  Service: Cardiovascular;  Laterality: N/A;  . ELECTROPHYSIOLOGIC STUDY N/A 06/18/2014   Procedure: CARDIOVERSION;  Surgeon: [name], MD;  Location: [facility];  Service: Cardiovascular;  Laterality: N/A;  . ESOPHAGOGASTRODUODENOSCOPY  2014  . GREAT TOE ARTHRODESIS, INTERPHALANGEAL JOINT Left   . HERNIA REPAIR Left    Inguinal  . PACEMAKER INSERTION N/A 07/02/2014   Procedure: INSERTION PACEMAKER/DUAL CHAMBER  INITIAL IMPLANT;  Surgeon: [name], MD;  Location: [facility];  Service: Cardiovascular;  Laterality: N/A;    Family History  Problem Relation Age of Onset  . Cancer Mother   . Cancer Father   . Kidney disease Sister   . Heart failure Brother     Social History  Substance Use Topics  . Smoking status: Former Smoker    Packs/day: 0.50    Years: 15.00    Types: Cigarettes    Quit date: 04/22/1985  . Smokeless tobacco: Former Systems developer    Types: Chew  . Alcohol use No    Allergies  Allergen Reactions  . Cefdinir Other (See Comments)    nightmares  . Adhesive [Tape] Rash  . Sulfa Antibiotics Rash  . Sulfamethoxazole Hives, Itching and Rash    Prior to Admission medications  Medication Sig Start Date End Date Taking? Authorizing Provider  acetaminophen (TYLENOL) 500 MG tablet Take 500 mg by mouth every 6 (six) hours as needed for mild pain, moderate pain or fever.    Yes Historical Provider, MD  aspirin EC 81 MG EC tablet Take 1 tablet (81 mg total) by mouth daily. 04/13/15  Yes Dustin Flock, MD  celecoxib (CELEBREX) 200 MG capsule Take 200 mg by mouth daily.   Yes Historical Provider, MD  cyanocobalamin 1000 MCG tablet Take 1,000 mcg by mouth daily.   Yes Historical Provider, MD  enalapril (VASOTEC) 5 MG tablet Take 1 tablet (5 mg total) by mouth daily. 11/30/15  Yes  Bettey Costa, MD  furosemide (LASIX) 20 MG tablet Take 20 mg by mouth every morning.    Yes Historical Provider, MD  gabapentin (NEURONTIN) 300 MG capsule Take 300 mg by mouth daily.    Yes Historical Provider, MD  glipiZIDE (GLUCOTROL XL) 5 MG 24 hr tablet Take 5 mg by mouth every morning. 01/21/15  Yes Historical Provider, MD  levothyroxine (SYNTHROID, LEVOTHROID) 137 MCG tablet Take 137 mcg by mouth daily before breakfast.    Yes Historical Provider, MD  loratadine (CLARITIN) 10 MG tablet Take 1 tablet by mouth daily. 05/21/15 05/20/16 Yes Historical Provider, MD  metFORMIN (GLUCOPHAGE) 500 MG tablet Take 500 mg by mouth 2 (two) times daily.    Yes Historical Provider, MD  metoprolol succinate (TOPROL-XL) 25 MG 24 hr tablet Take 25 mg by mouth daily.   Yes Historical Provider, MD  nitroGLYCERIN (NITROSTAT) 0.4 MG SL tablet Place 0.4 mg under the tongue every 5 (five) minutes as needed for chest pain.   Yes Historical Provider, MD  omeprazole (PRILOSEC) 20 MG capsule Take 20 mg by mouth every morning.    Yes Historical Provider, MD  simvastatin (ZOCOR) 40 MG tablet Take 40 mg by mouth every evening.    Yes Historical Provider, MD  sotalol (BETAPACE) 120 MG tablet Take 1 tablet (120 mg total) by mouth 2 (two) times daily. 11/30/15  Yes Bettey Costa, MD  tamsulosin (FLOMAX) 0.4 MG CAPS capsule Take 0.4 mg by mouth every morning.    Yes Historical Provider, MD  temazepam (RESTORIL) 15 MG capsule Take 15 mg by mouth at bedtime.    Yes Historical Provider, MD  warfarin (COUMADIN) 1 MG tablet Take 1 mg by mouth every evening. Pt takes with 3mg  tablet for a total dose of 4mg .   Yes Historical Provider, MD  warfarin (COUMADIN) 3 MG tablet Take 3 mg by mouth every evening. Pt takes with 1mg  tablet for a total dose of 4mg .   Yes Historical Provider, MD     Review of Systems  Constitutional: Positive for fatigue. Negative for appetite change.  HENT: Positive for rhinorrhea. Negative for congestion and sore  throat.   Eyes: Negative.   Respiratory: Positive for shortness of breath. Negative for cough and chest tightness.   Cardiovascular: Positive for palpitations (at times). Negative for chest pain and leg swelling.  Gastrointestinal: Negative for abdominal distention and abdominal pain.  Endocrine: Negative.   Genitourinary: Negative.   Musculoskeletal: Negative for back pain and neck pain.  Skin: Negative.   Allergic/Immunologic: Negative.   Neurological: Positive for light-headedness. Negative for dizziness.  Hematological: Negative for adenopathy. Bruises/bleeds easily.  Psychiatric/Behavioral: Negative for sleep disturbance (sleeping on 1 pillow). The patient is not nervous/anxious.    Vitals:   12/27/15 1006 12/27/15 1022  BP: (!) 141/121 132/60  Pulse: 84  Resp: 18   SpO2: 99%   Weight: 177 lb (80.3 kg)   Height: 5\' 10"  (1.778 m)     Wt Readings from Last 3 Encounters:  12/27/15 177 lb (80.3 kg)  11/30/15 172 lb 7 oz (78.2 kg)  09/07/15 184 lb (83.5 kg)   Lab Results  Component Value Date   CREATININE 1.24 11/29/2015   CREATININE 1.46 (H) 11/28/2015   CREATININE 1.62 (H) 04/13/2015      Physical Exam  Constitutional: He is oriented to person, place, and time. He appears well-developed and well-nourished.  HENT:  Head: Normocephalic and atraumatic.  Eyes: Conjunctivae are normal. Pupils are equal, round, and reactive to light.  Neck: Normal range of motion. Neck supple.  Cardiovascular: Normal rate.  An irregular rhythm present.  Pulmonary/Chest: Effort normal. He has no wheezes. He has no rales.  Abdominal: Soft. He exhibits no distension. There is no tenderness.  Musculoskeletal: He exhibits no edema or tenderness.  Neurological: He is alert and oriented to person, place, and time.  Skin: Skin is warm and dry.  Psychiatric: He has a normal mood and affect. His behavior is normal. Thought content normal.  Nursing note and vitals reviewed.  Assessment &  Plan:  1: Chronic heart failure with reduced ejection fraction- - NYHA class II - euvolemic - weight down 7 pounds since 09/07/15. Reminded to call for an overnight weight gain of >2 pounds or a weekly weight gain of >5 pounds - not adding salt to his food - received his flu & pneumonia vaccine for this season - not interested in switching to entresto at this time. Will continue to discuss this with him.  - also not interested in titrating up metoprolol at this time - sees his cardiologist ([place]) on 03/09/16  2: HTN- - BP initially high using the machine - normal when rechecked with the manual cuff - continue medications at this time - sees PCP Doy Hutching) on 01/18/16  3: Atrial fibrillation- - currently rate controlled with metoprolol and sotalol - also remains on warfarin  4: Diabetes- - glucose yesterday was 138 - continues on glipizide and metformin  Patient did not bring his medications nor a list. Each medication was verbally reviewed with the patient and he was encouraged to bring the bottles to every visit to confirm accuracy of list.  Return here in 6 months or sooner for any questions/problems before then.

## 2016-01-18 DIAGNOSIS — I480 Paroxysmal atrial fibrillation: Secondary | ICD-10-CM | POA: Diagnosis not present

## 2016-02-10 DIAGNOSIS — I480 Paroxysmal atrial fibrillation: Secondary | ICD-10-CM | POA: Diagnosis not present

## 2016-02-11 ENCOUNTER — Other Ambulatory Visit (INDEPENDENT_AMBULATORY_CARE_PROVIDER_SITE_OTHER): Payer: Self-pay | Admitting: Vascular Surgery

## 2016-02-11 DIAGNOSIS — I714 Abdominal aortic aneurysm, without rupture, unspecified: Secondary | ICD-10-CM

## 2016-02-15 ENCOUNTER — Encounter (INDEPENDENT_AMBULATORY_CARE_PROVIDER_SITE_OTHER): Payer: Self-pay | Admitting: Vascular Surgery

## 2016-02-15 ENCOUNTER — Ambulatory Visit (INDEPENDENT_AMBULATORY_CARE_PROVIDER_SITE_OTHER): Payer: Commercial Managed Care - HMO

## 2016-02-15 ENCOUNTER — Ambulatory Visit (INDEPENDENT_AMBULATORY_CARE_PROVIDER_SITE_OTHER): Payer: Commercial Managed Care - HMO | Admitting: Vascular Surgery

## 2016-02-15 VITALS — BP 127/83 | HR 62 | Resp 16 | Ht 70.0 in | Wt 177.0 lb

## 2016-02-15 DIAGNOSIS — I714 Abdominal aortic aneurysm, without rupture, unspecified: Secondary | ICD-10-CM

## 2016-02-15 DIAGNOSIS — E119 Type 2 diabetes mellitus without complications: Secondary | ICD-10-CM | POA: Diagnosis not present

## 2016-02-15 DIAGNOSIS — I10 Essential (primary) hypertension: Secondary | ICD-10-CM | POA: Diagnosis not present

## 2016-02-15 NOTE — Assessment & Plan Note (Signed)
blood pressure control important in reducing the progression of atherosclerotic disease and aneurysmal growth. On appropriate oral medications.  

## 2016-02-15 NOTE — Progress Notes (Signed)
MRN : [id number]  Derrick Cochran is a 81 y.o. ([birth year]) male who presents with chief complaint of  Chief Complaint  Patient presents with  . Re-evaluation    Ultrasound follow up  .  History of Present Illness: Patient returns today in follow up of AAA. He has no aneurysm related symptoms. Specifically, the patient denies new back or abdominal pain, or signs of peripheral embolization. His aortic duplex today demonstrates a stable 3.1 cm infrarenal abdominal aortic aneurysm. This correlates with his CT scan from April 2016 and is smaller than his ultrasound study from last summer which was likely a slight overestimate of the size of his aneurysm.  Current Outpatient Prescriptions  Medication Sig Dispense Refill  . acetaminophen (TYLENOL) 500 MG tablet Take 500 mg by mouth every 6 (six) hours as needed for mild pain, moderate pain or fever.     Marland Kitchen aspirin EC 81 MG EC tablet Take 1 tablet (81 mg total) by mouth daily.    . celecoxib (CELEBREX) 200 MG capsule Take 200 mg by mouth daily.    . cyanocobalamin 1000 MCG tablet Take 1,000 mcg by mouth daily.    . enalapril (VASOTEC) 5 MG tablet Take 1 tablet (5 mg total) by mouth daily. 30 tablet 0  . furosemide (LASIX) 20 MG tablet Take 20 mg by mouth every morning.     . gabapentin (NEURONTIN) 300 MG capsule Take 300 mg by mouth daily.     Marland Kitchen glipiZIDE (GLUCOTROL XL) 5 MG 24 hr tablet Take 5 mg by mouth every morning.    Marland Kitchen levothyroxine (SYNTHROID, LEVOTHROID) 137 MCG tablet Take 137 mcg by mouth daily before breakfast.     . loratadine (CLARITIN) 10 MG tablet Take 1 tablet by mouth daily.    . metFORMIN (GLUCOPHAGE) 500 MG tablet Take 500 mg by mouth 2 (two) times daily.     . metoprolol succinate (TOPROL-XL) 25 MG 24 hr tablet Take 25 mg by mouth daily.    . nitroGLYCERIN (NITROSTAT) 0.4 MG SL tablet Place 0.4 mg under the tongue every 5 (five) minutes as needed for chest pain.    Marland Kitchen omeprazole (PRILOSEC) 20 MG capsule Take 20 mg by mouth  every morning.     . simvastatin (ZOCOR) 40 MG tablet Take 40 mg by mouth every evening.     . sotalol (BETAPACE) 120 MG tablet Take 1 tablet (120 mg total) by mouth 2 (two) times daily. 60 tablet 0  . tamsulosin (FLOMAX) 0.4 MG CAPS capsule Take 0.4 mg by mouth every morning.     . temazepam (RESTORIL) 15 MG capsule Take 15 mg by mouth at bedtime.     Marland Kitchen warfarin (COUMADIN) 1 MG tablet Take 1 mg by mouth every evening. Pt takes with '3mg'$  tablet for a total dose of '4mg'$ .    . warfarin (COUMADIN) 3 MG tablet Take 3 mg by mouth every evening. Pt takes with '1mg'$  tablet for a total dose of '4mg'$ .     No current facility-administered medications for this visit.     Past Medical History:  Diagnosis Date  . AAA (abdominal aortic aneurysm) ([place])   . Arrhythmia    atrial fibrillation  . Arthritis   . BPH (benign prostatic hyperplasia)   . Cancer ([place])    skin  . CHF (congestive heart failure) ([place])   . Coronary artery disease   . DDD (degenerative disc disease), thoracic   . Diabetes mellitus without complication ([place])   .  Esophageal stricture   . GERD (gastroesophageal reflux disease)   . Heart murmur   . Hyperlipidemia   . Hypertension   . Hypothyroidism   . Myocardial infarction   . Shortness of breath dyspnea     Past Surgical History:  Procedure Laterality Date  . AORTIC VALVE REPLACEMENT  1995   Bioprosthetic - [place]  . APPENDECTOMY    . CARDIAC CATHETERIZATION  1994 x 2 with PCTA of RCA  . CARDIAC VALVE REPLACEMENT     Bovine transcatheter heart valve  . COLONOSCOPY  2015  . CORONARY ARTERY BYPASS GRAFT  1995   x 4 Vessels - [facility]  . ELECTROPHYSIOLOGIC STUDY N/A 08/06/2014   Procedure: Cardioversion;  Surgeon: [name], MD;  Location: [place] [facility];  Service: Cardiovascular;  Laterality: N/A;  . ELECTROPHYSIOLOGIC STUDY N/A 06/18/2014   Procedure: CARDIOVERSION;  Surgeon: [name], MD;  Location: [facility];  Service: Cardiovascular;  Laterality: N/A;  .  ESOPHAGOGASTRODUODENOSCOPY  2014  . GREAT TOE ARTHRODESIS, INTERPHALANGEAL JOINT Left   . HERNIA REPAIR Left    Inguinal  . PACEMAKER INSERTION N/A 07/02/2014   Procedure: INSERTION PACEMAKER/DUAL CHAMBER  INITIAL IMPLANT;  Surgeon: [name], MD;  Location: [facility];  Service: Cardiovascular;  Laterality: N/A;    Social History Social History  Substance Use Topics  . Smoking status: Former Smoker    Packs/day: 0.50    Years: 15.00    Types: Cigarettes    Quit date: 04/22/1985  . Smokeless tobacco: Former Systems developer    Types: Chew  . Alcohol use No     Family History Family History  Problem Relation Age of Onset  . Cancer Mother   . Cancer Father   . Kidney disease Sister   . Heart failure Brother      Allergies  Allergen Reactions  . Cefdinir Other (See Comments)    nightmares  . Adhesive [Tape] Rash  . Sulfa Antibiotics Rash  . Sulfamethoxazole Hives, Itching and Rash     REVIEW OF SYSTEMS (Negative unless checked)  Constitutional: '[]'$ Weight loss  '[]'$ Fever  '[]'$ Chills Cardiac: '[]'$ Chest pain   '[]'$ Chest pressure   '[x]'$ Palpitations   '[]'$ Shortness of breath when laying flat   '[]'$ Shortness of breath at rest   '[]'$ Shortness of breath with exertion. Vascular:  '[]'$ Pain in legs with walking   '[]'$ Pain in legs at rest   '[]'$ Pain in legs when laying flat   '[]'$ Claudication   '[]'$ Pain in feet when walking  '[]'$ Pain in feet at rest  '[]'$ Pain in feet when laying flat   '[]'$ History of DVT   '[]'$ Phlebitis   '[]'$ Swelling in legs   '[]'$ Varicose veins   '[]'$ Non-healing ulcers Pulmonary:   '[]'$ Uses home oxygen   '[]'$ Productive cough   '[]'$ Hemoptysis   '[]'$ Wheeze  '[]'$ COPD   '[]'$ Asthma Neurologic:  '[]'$ Dizziness  '[]'$ Blackouts   '[]'$ Seizures   '[]'$ History of stroke   '[]'$ History of TIA  '[]'$ Aphasia   '[]'$ Temporary blindness   '[]'$ Dysphagia   '[]'$ Weakness or numbness in arms   '[]'$ Weakness or numbness in legs Musculoskeletal:  '[]'$ Arthritis   '[]'$ Joint swelling   '[]'$ Joint pain   '[]'$ Low back pain Hematologic:  '[]'$ Easy bruising  '[]'$ Easy bleeding    '[]'$ Hypercoagulable state   '[]'$ Anemic   Gastrointestinal:  '[]'$ Blood in stool   '[]'$ Vomiting blood  '[]'$ Gastroesophageal reflux/heartburn   '[]'$ Abdominal pain Genitourinary:  '[]'$ Chronic kidney disease   '[]'$ Difficult urination  '[]'$ Frequent urination  '[]'$ Burning with urination   '[]'$ Hematuria Skin:  '[]'$ Rashes   '[]'$ Ulcers   '[]'$ Wounds Psychological:  '[]'$ History  of anxiety   '[]'$  History of major depression.  Physical Examination  BP 127/83 (BP Location: Right Arm)   Pulse 62   Resp 16   Ht '5\' 10"'$  (1.778 m)   Wt 177 lb (80.3 kg)   BMI 25.40 kg/m  Gen:  WD/WN, NAD Head: [place]/AT, No temporalis wasting. Ear/Nose/Throat: Hearing grossly intact, nares w/o erythema or drainage, trachea midline Eyes: Conjunctiva clear. Sclera non-icteric Neck: Supple.  No JVD.  Pulmonary:  Good air movement, no use of accessory muscles.  Cardiac: Irregularly irregular Vascular:  Vessel Right Left  Radial Palpable Palpable              Aorta Not palpable N/A                   Gastrointestinal: soft, non-tender/non-distended. No guarding/reflex.  Musculoskeletal: M/S 5/5 throughout.  No deformity or atrophy.  Neurologic: Sensation grossly intact in extremities.  Symmetrical.  Speech is fluent.  Psychiatric: Judgment intact, Mood & affect appropriate for pt's clinical situation. Dermatologic: No rashes or ulcers noted.  No cellulitis or open wounds. Lymph : No Cervical, Axillary, or Inguinal lymphadenopathy.      Labs Recent Results (from the past 2160 hour(s))  Basic metabolic panel     Status: Abnormal   Collection Time: 11/28/15  5:19 PM  Result Value Ref Range   Sodium 139 135 - 145 mmol/L   Potassium 4.5 3.5 - 5.1 mmol/L   Chloride 106 101 - 111 mmol/L   CO2 28 22 - 32 mmol/L   Glucose, Bld 100 (H) 65 - 99 mg/dL   BUN 31 (H) 6 - 20 mg/dL   Creatinine, Ser 1.46 (H) 0.61 - 1.24 mg/dL   Calcium 9.0 8.9 - 10.3 mg/dL   GFR calc non Af Amer 44 (L) >60 mL/min   GFR calc Af Amer 51 (L) >60 mL/min    Comment:  (NOTE) The eGFR has been calculated using the CKD EPI equation. This calculation has not been validated in all clinical situations. eGFR's persistently <60 mL/min signify possible Chronic Kidney Disease.    Anion gap 5 5 - 15  CBC     Status: Abnormal   Collection Time: 11/28/15  5:19 PM  Result Value Ref Range   WBC 7.5 3.8 - 10.6 K/uL   RBC 3.94 (L) 4.40 - 5.90 MIL/uL   Hemoglobin 13.3 13.0 - 18.0 g/dL   HCT 38.3 (L) 40.0 - 52.0 %   MCV 97.4 80.0 - 100.0 fL   MCH 33.8 26.0 - 34.0 pg   MCHC 34.7 32.0 - 36.0 g/dL   RDW 13.6 11.5 - 14.5 %   Platelets 89 (L) 150 - 440 K/uL  Troponin I     Status: None   Collection Time: 11/28/15  5:19 PM  Result Value Ref Range   Troponin I <0.03 <0.03 ng/mL  Brain natriuretic peptide     Status: Abnormal   Collection Time: 11/28/15  5:19 PM  Result Value Ref Range   B Natriuretic Peptide 1,701.0 (H) 0.0 - 100.0 pg/mL  Protime-INR     Status: Abnormal   Collection Time: 11/28/15  5:19 PM  Result Value Ref Range   Prothrombin Time 31.7 (H) 11.4 - 15.2 seconds   INR 2.99   TSH     Status: None   Collection Time: 11/28/15  5:19 PM  Result Value Ref Range   TSH 0.693 0.350 - 4.500 uIU/mL    Comment: Performed by a 3rd Generation assay  with a functional sensitivity of <=0.01 uIU/mL.  Magnesium     Status: Abnormal   Collection Time: 11/28/15  5:19 PM  Result Value Ref Range   Magnesium 1.4 (L) 1.7 - 2.4 mg/dL  Hemoglobin A1c     Status: Abnormal   Collection Time: 11/28/15  5:19 PM  Result Value Ref Range   Hgb A1c MFr Bld 5.9 (H) 4.8 - 5.6 %    Comment: (NOTE)         Pre-diabetes: 5.7 - 6.4         Diabetes: >6.4         Glycemic control for adults with diabetes: <7.0    Mean Plasma Glucose 123 mg/dL    Comment: (NOTE) Performed At: [facility] [place], [place] [id number] Lindon Romp MD [id number]   Glucose, capillary     Status: None   Collection Time: 11/28/15  9:11 PM  Result Value Ref Range    Glucose-Capillary 84 65 - 99 mg/dL  Troponin I     Status: None   Collection Time: 11/28/15 11:14 PM  Result Value Ref Range   Troponin I <0.03 <0.03 ng/mL  Basic metabolic panel     Status: Abnormal   Collection Time: 11/29/15  5:31 AM  Result Value Ref Range   Sodium 140 135 - 145 mmol/L   Potassium 3.5 3.5 - 5.1 mmol/L   Chloride 105 101 - 111 mmol/L   CO2 29 22 - 32 mmol/L   Glucose, Bld 88 65 - 99 mg/dL   BUN 33 (H) 6 - 20 mg/dL   Creatinine, Ser 1.24 0.61 - 1.24 mg/dL   Calcium 8.4 (L) 8.9 - 10.3 mg/dL   GFR calc non Af Amer 54 (L) >60 mL/min   GFR calc Af Amer >60 >60 mL/min    Comment: (NOTE) The eGFR has been calculated using the CKD EPI equation. This calculation has not been validated in all clinical situations. eGFR's persistently <60 mL/min signify possible Chronic Kidney Disease.    Anion gap 6 5 - 15  CBC     Status: Abnormal   Collection Time: 11/29/15  5:31 AM  Result Value Ref Range   WBC 6.2 3.8 - 10.6 K/uL   RBC 3.56 (L) 4.40 - 5.90 MIL/uL   Hemoglobin 12.1 (L) 13.0 - 18.0 g/dL   HCT 34.7 (L) 40.0 - 52.0 %   MCV 97.4 80.0 - 100.0 fL   MCH 34.0 26.0 - 34.0 pg   MCHC 34.9 32.0 - 36.0 g/dL   RDW 13.5 11.5 - 14.5 %   Platelets 76 (L) 150 - 440 K/uL  Troponin I     Status: None   Collection Time: 11/29/15  5:31 AM  Result Value Ref Range   Troponin I <0.03 <0.03 ng/mL  Glucose, capillary     Status: None   Collection Time: 11/29/15  7:24 AM  Result Value Ref Range   Glucose-Capillary 82 65 - 99 mg/dL  Troponin I     Status: None   Collection Time: 11/29/15 11:18 AM  Result Value Ref Range   Troponin I <0.03 <0.03 ng/mL  Protime-INR     Status: Abnormal   Collection Time: 11/29/15 11:18 AM  Result Value Ref Range   Prothrombin Time 32.5 (H) 11.4 - 15.2 seconds   INR 3.08   Glucose, capillary     Status: Abnormal   Collection Time: 11/29/15 11:35 AM  Result Value Ref Range   Glucose-Capillary 187 (  H) 65 - 99 mg/dL  Glucose, capillary     Status:  None   Collection Time: 11/29/15  5:19 PM  Result Value Ref Range   Glucose-Capillary 92 65 - 99 mg/dL  ECHOCARDIOGRAM COMPLETE     Status: None   Collection Time: 11/29/15  6:35 PM  Result Value Ref Range   Weight 2,832.47 oz   Height 70 in   BP 107/60 mmHg  Glucose, capillary     Status: Abnormal   Collection Time: 11/29/15  9:21 PM  Result Value Ref Range   Glucose-Capillary 190 (H) 65 - 99 mg/dL  Protime-INR     Status: Abnormal   Collection Time: 11/30/15  4:34 AM  Result Value Ref Range   Prothrombin Time 35.0 (H) 11.4 - 15.2 seconds   INR 3.38   Glucose, capillary     Status: None   Collection Time: 11/30/15  7:30 AM  Result Value Ref Range   Glucose-Capillary 85 65 - 99 mg/dL  Glucose, capillary     Status: Abnormal   Collection Time: 11/30/15 11:40 AM  Result Value Ref Range   Glucose-Capillary 189 (H) 65 - 99 mg/dL    Radiology No results found.    Assessment/Plan  HTN (hypertension) blood pressure control important in reducing the progression of atherosclerotic disease and aneurysmal growth. On appropriate oral medications.   Diabetes ([place]) blood glucose control important in reducing the progression of atherosclerotic disease. Also, involved in wound healing. On appropriate medications.   AAA (abdominal aortic aneurysm) without rupture (HCC) No surgery or intervention at this time. The patient has an asymptomatic abdominal aortic aneurysm that is less than 4 cm in maximal diameter.  I have discussed the natural history of abdominal aortic aneurysm and the small risk of rupture for aneurysm less than 5 cm in size.  However, as these small aneurysms tend to enlarge over time, continued surveillance with ultrasound or CT scan is mandatory.  I have also discussed optimizing medical management with hypertension and lipid control and the importance of abstinence from tobacco.  The patient is also encouraged to exercise a minimum of 30 minutes 4 times a week.    Should the patient develop new onset abdominal or back pain or signs of peripheral embolization they are instructed to seek medical attention immediately and to alert the physician providing care that they have an aneurysm.  The patient voices their understanding. The patient will return in 12 months with an aortic duplex.     Leotis Pain, MD  02/15/2016 9:55 AM    This note was created with Dragon medical transcription system.  Any errors from dictation are purely unintentional

## 2016-02-15 NOTE — Assessment & Plan Note (Signed)
No surgery or intervention at this time. The patient has an asymptomatic abdominal aortic aneurysm that is less than 4 cm in maximal diameter.  I have discussed the natural history of abdominal aortic aneurysm and the small risk of rupture for aneurysm less than 5 cm in size.  However, as these small aneurysms tend to enlarge over time, continued surveillance with ultrasound or CT scan is mandatory.  I have also discussed optimizing medical management with hypertension and lipid control and the importance of abstinence from tobacco.  The patient is also encouraged to exercise a minimum of 30 minutes 4 times a week.  Should the patient develop new onset abdominal or back pain or signs of peripheral embolization they are instructed to seek medical attention immediately and to alert the physician providing care that they have an aneurysm.  The patient voices their understanding. The patient will return in 12 months with an aortic duplex.  

## 2016-02-15 NOTE — Assessment & Plan Note (Signed)
blood glucose control important in reducing the progression of atherosclerotic disease. Also, involved in wound healing. On appropriate medications.  

## 2016-02-16 ENCOUNTER — Other Ambulatory Visit (INDEPENDENT_AMBULATORY_CARE_PROVIDER_SITE_OTHER): Payer: Self-pay

## 2016-03-09 DIAGNOSIS — I480 Paroxysmal atrial fibrillation: Secondary | ICD-10-CM | POA: Diagnosis not present

## 2016-03-23 DIAGNOSIS — E113393 Type 2 diabetes mellitus with moderate nonproliferative diabetic retinopathy without macular edema, bilateral: Secondary | ICD-10-CM | POA: Diagnosis not present

## 2016-03-23 DIAGNOSIS — H524 Presbyopia: Secondary | ICD-10-CM | POA: Diagnosis not present

## 2016-03-24 DIAGNOSIS — I482 Chronic atrial fibrillation: Secondary | ICD-10-CM | POA: Diagnosis not present

## 2016-03-24 DIAGNOSIS — I5022 Chronic systolic (congestive) heart failure: Secondary | ICD-10-CM | POA: Diagnosis not present

## 2016-03-24 DIAGNOSIS — I214 Non-ST elevation (NSTEMI) myocardial infarction: Secondary | ICD-10-CM | POA: Diagnosis not present

## 2016-03-24 DIAGNOSIS — I10 Essential (primary) hypertension: Secondary | ICD-10-CM | POA: Diagnosis not present

## 2016-03-24 DIAGNOSIS — I480 Paroxysmal atrial fibrillation: Secondary | ICD-10-CM | POA: Diagnosis not present

## 2016-03-24 DIAGNOSIS — I350 Nonrheumatic aortic (valve) stenosis: Secondary | ICD-10-CM | POA: Diagnosis not present

## 2016-03-24 DIAGNOSIS — I495 Sick sinus syndrome: Secondary | ICD-10-CM | POA: Diagnosis not present

## 2016-03-24 DIAGNOSIS — I4891 Unspecified atrial fibrillation: Secondary | ICD-10-CM | POA: Diagnosis not present

## 2016-03-24 DIAGNOSIS — I2119 ST elevation (STEMI) myocardial infarction involving other coronary artery of inferior wall: Secondary | ICD-10-CM | POA: Diagnosis not present

## 2016-04-10 DIAGNOSIS — I480 Paroxysmal atrial fibrillation: Secondary | ICD-10-CM | POA: Diagnosis not present

## 2016-05-10 DIAGNOSIS — I482 Chronic atrial fibrillation: Secondary | ICD-10-CM | POA: Diagnosis not present

## 2016-05-22 DIAGNOSIS — H40003 Preglaucoma, unspecified, bilateral: Secondary | ICD-10-CM | POA: Diagnosis not present

## 2016-05-29 DIAGNOSIS — I482 Chronic atrial fibrillation: Secondary | ICD-10-CM | POA: Diagnosis not present

## 2016-05-29 DIAGNOSIS — I714 Abdominal aortic aneurysm, without rupture: Secondary | ICD-10-CM | POA: Diagnosis not present

## 2016-05-29 DIAGNOSIS — I5022 Chronic systolic (congestive) heart failure: Secondary | ICD-10-CM | POA: Diagnosis not present

## 2016-05-29 DIAGNOSIS — I10 Essential (primary) hypertension: Secondary | ICD-10-CM | POA: Diagnosis not present

## 2016-05-29 DIAGNOSIS — Z0000 Encounter for general adult medical examination without abnormal findings: Secondary | ICD-10-CM | POA: Diagnosis not present

## 2016-05-29 DIAGNOSIS — E119 Type 2 diabetes mellitus without complications: Secondary | ICD-10-CM | POA: Diagnosis not present

## 2016-05-29 DIAGNOSIS — E039 Hypothyroidism, unspecified: Secondary | ICD-10-CM | POA: Diagnosis not present

## 2016-05-29 DIAGNOSIS — M5136 Other intervertebral disc degeneration, lumbar region: Secondary | ICD-10-CM | POA: Diagnosis not present

## 2016-05-29 DIAGNOSIS — W57XXXA Bitten or stung by nonvenomous insect and other nonvenomous arthropods, initial encounter: Secondary | ICD-10-CM | POA: Diagnosis not present

## 2016-06-06 DIAGNOSIS — R946 Abnormal results of thyroid function studies: Secondary | ICD-10-CM | POA: Diagnosis not present

## 2016-06-06 DIAGNOSIS — E039 Hypothyroidism, unspecified: Secondary | ICD-10-CM | POA: Diagnosis not present

## 2016-06-06 DIAGNOSIS — E7800 Pure hypercholesterolemia, unspecified: Secondary | ICD-10-CM | POA: Diagnosis not present

## 2016-06-06 DIAGNOSIS — M79675 Pain in left toe(s): Secondary | ICD-10-CM | POA: Diagnosis not present

## 2016-06-06 DIAGNOSIS — I482 Chronic atrial fibrillation: Secondary | ICD-10-CM | POA: Diagnosis not present

## 2016-06-06 DIAGNOSIS — I10 Essential (primary) hypertension: Secondary | ICD-10-CM | POA: Diagnosis not present

## 2016-06-06 DIAGNOSIS — E119 Type 2 diabetes mellitus without complications: Secondary | ICD-10-CM | POA: Diagnosis not present

## 2016-06-06 DIAGNOSIS — Z79899 Other long term (current) drug therapy: Secondary | ICD-10-CM | POA: Diagnosis not present

## 2016-06-27 ENCOUNTER — Ambulatory Visit: Payer: Medicare HMO | Attending: Family | Admitting: Family

## 2016-06-27 ENCOUNTER — Encounter: Payer: Self-pay | Admitting: Family

## 2016-06-27 VITALS — BP 117/64 | HR 81 | Resp 20 | Ht 70.0 in | Wt 171.0 lb

## 2016-06-27 DIAGNOSIS — Z7901 Long term (current) use of anticoagulants: Secondary | ICD-10-CM | POA: Diagnosis not present

## 2016-06-27 DIAGNOSIS — Z882 Allergy status to sulfonamides status: Secondary | ICD-10-CM | POA: Insufficient documentation

## 2016-06-27 DIAGNOSIS — I4891 Unspecified atrial fibrillation: Secondary | ICD-10-CM | POA: Diagnosis not present

## 2016-06-27 DIAGNOSIS — I2510 Atherosclerotic heart disease of native coronary artery without angina pectoris: Secondary | ICD-10-CM | POA: Insufficient documentation

## 2016-06-27 DIAGNOSIS — I714 Abdominal aortic aneurysm, without rupture: Secondary | ICD-10-CM | POA: Diagnosis not present

## 2016-06-27 DIAGNOSIS — E119 Type 2 diabetes mellitus without complications: Secondary | ICD-10-CM | POA: Insufficient documentation

## 2016-06-27 DIAGNOSIS — K219 Gastro-esophageal reflux disease without esophagitis: Secondary | ICD-10-CM | POA: Insufficient documentation

## 2016-06-27 DIAGNOSIS — Z7984 Long term (current) use of oral hypoglycemic drugs: Secondary | ICD-10-CM | POA: Insufficient documentation

## 2016-06-27 DIAGNOSIS — Z79899 Other long term (current) drug therapy: Secondary | ICD-10-CM | POA: Diagnosis not present

## 2016-06-27 DIAGNOSIS — Z951 Presence of aortocoronary bypass graft: Secondary | ICD-10-CM | POA: Diagnosis not present

## 2016-06-27 DIAGNOSIS — Z953 Presence of xenogenic heart valve: Secondary | ICD-10-CM | POA: Diagnosis not present

## 2016-06-27 DIAGNOSIS — I110 Hypertensive heart disease with heart failure: Secondary | ICD-10-CM | POA: Insufficient documentation

## 2016-06-27 DIAGNOSIS — I5022 Chronic systolic (congestive) heart failure: Secondary | ICD-10-CM | POA: Diagnosis not present

## 2016-06-27 DIAGNOSIS — Z8249 Family history of ischemic heart disease and other diseases of the circulatory system: Secondary | ICD-10-CM | POA: Insufficient documentation

## 2016-06-27 DIAGNOSIS — Z87891 Personal history of nicotine dependence: Secondary | ICD-10-CM | POA: Insufficient documentation

## 2016-06-27 DIAGNOSIS — I252 Old myocardial infarction: Secondary | ICD-10-CM | POA: Diagnosis not present

## 2016-06-27 DIAGNOSIS — Z950 Presence of cardiac pacemaker: Secondary | ICD-10-CM | POA: Insufficient documentation

## 2016-06-27 DIAGNOSIS — N400 Enlarged prostate without lower urinary tract symptoms: Secondary | ICD-10-CM | POA: Insufficient documentation

## 2016-06-27 DIAGNOSIS — E039 Hypothyroidism, unspecified: Secondary | ICD-10-CM | POA: Insufficient documentation

## 2016-06-27 DIAGNOSIS — I10 Essential (primary) hypertension: Secondary | ICD-10-CM

## 2016-06-27 DIAGNOSIS — E785 Hyperlipidemia, unspecified: Secondary | ICD-10-CM | POA: Insufficient documentation

## 2016-06-27 DIAGNOSIS — Z841 Family history of disorders of kidney and ureter: Secondary | ICD-10-CM | POA: Insufficient documentation

## 2016-06-27 DIAGNOSIS — Z7982 Long term (current) use of aspirin: Secondary | ICD-10-CM | POA: Insufficient documentation

## 2016-06-27 DIAGNOSIS — Z881 Allergy status to other antibiotic agents status: Secondary | ICD-10-CM | POA: Insufficient documentation

## 2016-06-27 DIAGNOSIS — I509 Heart failure, unspecified: Secondary | ICD-10-CM | POA: Diagnosis present

## 2016-06-27 NOTE — Progress Notes (Signed)
Patient ID: Derrick Cochran, male    DOB: 04/06/1935, 81 y.o.   MRN: [id number]  HPI  Derrick Cochran is a 81 y/o male with a history of MI, hypothyroidism, HTN, hyperlipidemia, GERD, DM, BPH, atrial fibrillation, AAA, remote tobacco use and chronic heart failure.   Last echo was done 11/29/15 which showed an EF of 30-35% with bioprosthesis valve present. Also has mild Derrick. This is an improvement from Derrick Cochran EF of 20-25% on 04/11/15. Last catheterization was done 12/04/13. CABG done 1995.  Last admission was on 11/28/15 for atrial fibrillation along with HF exacerbation. Sotalol was increased and cardiology consult was done and he was discharged after 2 days. Previous admission was 04/11/15 with HF exacerbation and atrial fibrillation. Treated with IV diuretics and cardiology consult was obtained.   He presents today for a follow-up visit with a chief complaint of mild shortness of breath with moderate exertion. He says that it's been present for "years" but has gotten worse over the last couple of month as Derrick Cochran activity has declined. He has associated fatigue, palpitations, light-headedness and weight loss.  Past Medical History:  Diagnosis Date  . AAA (abdominal aortic aneurysm) ([place])   . Arrhythmia    atrial fibrillation  . Arthritis   . BPH (benign prostatic hyperplasia)   . Cancer ([place])    skin  . CHF (congestive heart failure) ([place])   . Coronary artery disease   . DDD (degenerative disc disease), thoracic   . Diabetes mellitus without complication ([place])   . Esophageal stricture   . GERD (gastroesophageal reflux disease)   . Heart murmur   . Hyperlipidemia   . Hypertension   . Hypothyroidism   . Myocardial infarction   . Shortness of breath dyspnea     Past Surgical History:  Procedure Laterality Date  . AORTIC VALVE REPLACEMENT  1995   Bioprosthetic - [place]  . APPENDECTOMY    . CARDIAC CATHETERIZATION  1994 x 2 with PCTA of RCA  . CARDIAC VALVE REPLACEMENT     Bovine transcatheter  heart valve  . COLONOSCOPY  2015  . CORONARY ARTERY BYPASS GRAFT  1995   x 4 Vessels - [facility]  . ELECTROPHYSIOLOGIC STUDY N/A 08/06/2014   Procedure: Cardioversion;  Surgeon: [name], MD;  Location: [place] [facility];  Service: Cardiovascular;  Laterality: N/A;  . ELECTROPHYSIOLOGIC STUDY N/A 06/18/2014   Procedure: CARDIOVERSION;  Surgeon: [name], MD;  Location: [facility];  Service: Cardiovascular;  Laterality: N/A;  . ESOPHAGOGASTRODUODENOSCOPY  2014  . GREAT TOE ARTHRODESIS, INTERPHALANGEAL JOINT Left   . HERNIA REPAIR Left    Inguinal  . PACEMAKER INSERTION N/A 07/02/2014   Procedure: INSERTION PACEMAKER/DUAL CHAMBER  INITIAL IMPLANT;  Surgeon: [name], MD;  Location: [facility];  Service: Cardiovascular;  Laterality: N/A;    Family History  Problem Relation Age of Onset  . Cancer Mother   . Cancer Father   . Kidney disease Sister   . Heart failure Brother     Social History  Substance Use Topics  . Smoking status: Former Smoker    Packs/day: 0.50    Years: 15.00    Types: Cigarettes    Quit date: 04/22/1985  . Smokeless tobacco: Former Systems developer    Types: Chew  . Alcohol use No    Allergies  Allergen Reactions  . Cefdinir Other (See Comments)    nightmares  . Adhesive [Tape] Rash  . Sulfa Antibiotics Rash  . Sulfamethoxazole Hives, Itching and Rash  Prior to Admission medications   Medication Sig Start Date End Date Taking? Authorizing Provider  acetaminophen (TYLENOL) 500 MG tablet Take 500 mg by mouth every 6 (six) hours as needed for mild pain, moderate pain or fever.    Yes [provider]  aspirin EC 81 MG EC tablet Take 1 tablet (81 mg total) by mouth daily. 04/13/15  Yes Dustin Flock, MD  celecoxib (CELEBREX) 200 MG capsule Take 200 mg by mouth daily.   Yes [provider]  cyanocobalamin 1000 MCG tablet Take 1,000 mcg by mouth daily.   Yes [provider]  dapagliflozin propanediol (FARXIGA) 10 MG  TABS tablet Take 10 mg by mouth daily.   Yes [provider]  enalapril (VASOTEC) 5 MG tablet Take 1 tablet (5 mg total) by mouth daily. 11/30/15  Yes Mody, Ulice Bold, MD  furosemide (LASIX) 20 MG tablet Take 20 mg by mouth every morning.    Yes [provider]  gabapentin (NEURONTIN) 300 MG capsule Take 300 mg by mouth daily.    Yes [provider]  glipiZIDE (GLUCOTROL XL) 5 MG 24 hr tablet Take 5 mg by mouth every morning. 01/21/15  Yes [provider]  levothyroxine (SYNTHROID, LEVOTHROID) 112 MCG tablet Take 112 mcg by mouth daily before breakfast.    Yes [provider]  loratadine (CLARITIN) 10 MG tablet Take 1 tablet by mouth daily. 05/21/15 06/27/16 Yes [provider]  metFORMIN (GLUCOPHAGE) 500 MG tablet Take 500 mg by mouth 2 (two) times daily.    Yes [provider]  metoprolol succinate (TOPROL-XL) 25 MG 24 hr tablet Take 25 mg by mouth daily.   Yes [provider]  nitroGLYCERIN (NITROSTAT) 0.4 MG SL tablet Place 0.4 mg under the tongue every 5 (five) minutes as needed for chest pain.   Yes [provider]  omeprazole (PRILOSEC) 20 MG capsule Take 20 mg by mouth every morning.    Yes [provider]  simvastatin (ZOCOR) 40 MG tablet Take 40 mg by mouth every evening.    Yes [provider]  sotalol (BETAPACE) 120 MG tablet Take 1 tablet (120 mg total) by mouth 2 (two) times daily. 11/30/15  Yes Mody, Ulice Bold, MD  tamsulosin (FLOMAX) 0.4 MG CAPS capsule Take 0.4 mg by mouth every morning.    Yes [provider]  temazepam (RESTORIL) 15 MG capsule Take 15 mg by mouth at bedtime.    Yes [provider]  warfarin (COUMADIN) 1 MG tablet Take 1 mg by mouth every evening. Pt takes with 3mg  tablet for a total dose of 4mg .   Yes [provider]  warfarin (COUMADIN) 3 MG tablet Take 3 mg by mouth every evening. Pt takes with 1mg  tablet for a total dose of 4mg .   Yes [provider]    Review of Systems  Constitutional: Positive for fatigue. Negative for appetite change.  HENT: Positive for rhinorrhea. Negative for congestion and sore throat.   Eyes: Negative.   Respiratory: Positive for shortness of breath. Negative for cough and chest tightness.   Cardiovascular: Positive for palpitations (at times). Negative for chest pain and leg swelling.  Gastrointestinal: Negative for abdominal distention and abdominal pain.  Endocrine: Negative.   Genitourinary: Negative.   Musculoskeletal: Negative for back pain and neck pain.  Skin: Negative.   Allergic/Immunologic: Negative.   Neurological: Positive for light-headedness. Negative for dizziness.  Hematological: Negative for adenopathy. Bruises/bleeds easily.  Psychiatric/Behavioral: Positive for sleep disturbance (sleeping on 1  pillow; waking up around 3-4am every morning). Negative for dysphoric mood. The patient is not nervous/anxious.    Vitals:   06/27/16 0959  BP: 117/64  Pulse: 81  Resp: 20  SpO2: 100%  Weight: 171 lb (77.6 kg)  Height: 5\' 10"  (1.778 m)   Wt Readings from Last 3 Encounters:  06/27/16 171 lb (77.6 kg)  02/15/16 177 lb (80.3 kg)  12/27/15 177 lb (80.3 kg)    Lab Results  Component Value Date   CREATININE 1.24 11/29/2015   CREATININE 1.46 (H) 11/28/2015   CREATININE 1.62 (H) 04/13/2015   Physical Exam  Constitutional: He is oriented to person, place, and time. He appears well-developed and well-nourished.  HENT:  Head: Normocephalic and atraumatic.  Neck: Normal range of motion. Neck supple.  Cardiovascular: Normal rate.  An irregular rhythm present.  Pulmonary/Chest: Effort normal. He has no wheezes. He has no rales.  Abdominal: Soft. He exhibits no distension. There is no tenderness.  Musculoskeletal: He exhibits no edema or tenderness.  Neurological: He is alert and oriented to person, place, and time.  Skin: Skin is warm and dry.  Psychiatric: He has a normal  mood and affect. Derrick Cochran behavior is normal. Thought content normal.  Nursing note and vitals reviewed.  Assessment & Plan:  1: Chronic heart failure with reduced ejection fraction- - NYHA class II - euvolemic - weight down 6 pounds since he was last here in November 2017. Reminded to call for an overnight weight gain of >2 pounds or a weekly weight gain of >5 pounds - not adding salt to Derrick Cochran food - feels like Derrick Cochran shortness of breath has worsened but he admits that Derrick Cochran activity level has declined because Derrick Cochran wife had foot surgery - not interested in switching to entresto at this time. Will continue to discuss this with him.  - also not interested in titrating up metoprolol at this time - saw Derrick Cochran cardiologist ([place]) on 03/24/16  2: HTN- - BP looks good today - continue medications at this time - saw PCP Doy Hutching) on 05/29/16 and returns October 2018  Patient did not bring Derrick Cochran medications nor a list. Each medication was verbally reviewed with the patient and he was encouraged to bring the bottles to every visit to confirm accuracy of list.  Return here in 1 year or sooner for any questions/problems before then.

## 2016-06-27 NOTE — Patient Instructions (Signed)
Continue weighing daily and call for an overnight weight gain of > 2 pounds or a weekly weight gain of >5 pounds. 

## 2016-07-05 DIAGNOSIS — I480 Paroxysmal atrial fibrillation: Secondary | ICD-10-CM | POA: Diagnosis not present

## 2016-07-21 DIAGNOSIS — I714 Abdominal aortic aneurysm, without rupture: Secondary | ICD-10-CM | POA: Diagnosis not present

## 2016-07-21 DIAGNOSIS — I5022 Chronic systolic (congestive) heart failure: Secondary | ICD-10-CM | POA: Diagnosis not present

## 2016-07-21 DIAGNOSIS — I350 Nonrheumatic aortic (valve) stenosis: Secondary | ICD-10-CM | POA: Diagnosis not present

## 2016-07-21 DIAGNOSIS — I214 Non-ST elevation (NSTEMI) myocardial infarction: Secondary | ICD-10-CM | POA: Diagnosis not present

## 2016-07-21 DIAGNOSIS — I10 Essential (primary) hypertension: Secondary | ICD-10-CM | POA: Diagnosis not present

## 2016-07-21 DIAGNOSIS — I2119 ST elevation (STEMI) myocardial infarction involving other coronary artery of inferior wall: Secondary | ICD-10-CM | POA: Diagnosis not present

## 2016-07-21 DIAGNOSIS — I482 Chronic atrial fibrillation: Secondary | ICD-10-CM | POA: Diagnosis not present

## 2016-07-21 DIAGNOSIS — I4891 Unspecified atrial fibrillation: Secondary | ICD-10-CM | POA: Diagnosis not present

## 2016-07-21 DIAGNOSIS — I495 Sick sinus syndrome: Secondary | ICD-10-CM | POA: Diagnosis not present

## 2016-08-01 DIAGNOSIS — I480 Paroxysmal atrial fibrillation: Secondary | ICD-10-CM | POA: Diagnosis not present

## 2016-08-30 DIAGNOSIS — L578 Other skin changes due to chronic exposure to nonionizing radiation: Secondary | ICD-10-CM | POA: Diagnosis not present

## 2016-08-30 DIAGNOSIS — L989 Disorder of the skin and subcutaneous tissue, unspecified: Secondary | ICD-10-CM | POA: Diagnosis not present

## 2016-08-30 DIAGNOSIS — D485 Neoplasm of uncertain behavior of skin: Secondary | ICD-10-CM | POA: Diagnosis not present

## 2016-08-30 DIAGNOSIS — C44229 Squamous cell carcinoma of skin of left ear and external auricular canal: Secondary | ICD-10-CM | POA: Diagnosis not present

## 2016-08-30 DIAGNOSIS — L821 Other seborrheic keratosis: Secondary | ICD-10-CM | POA: Diagnosis not present

## 2016-08-30 DIAGNOSIS — L820 Inflamed seborrheic keratosis: Secondary | ICD-10-CM | POA: Diagnosis not present

## 2016-10-03 DIAGNOSIS — D0422 Carcinoma in situ of skin of left ear and external auricular canal: Secondary | ICD-10-CM | POA: Diagnosis not present

## 2016-10-03 DIAGNOSIS — C44229 Squamous cell carcinoma of skin of left ear and external auricular canal: Secondary | ICD-10-CM | POA: Diagnosis not present

## 2016-10-10 DIAGNOSIS — C44229 Squamous cell carcinoma of skin of left ear and external auricular canal: Secondary | ICD-10-CM | POA: Diagnosis not present

## 2016-10-18 DIAGNOSIS — Z953 Presence of xenogenic heart valve: Secondary | ICD-10-CM | POA: Diagnosis not present

## 2016-10-18 DIAGNOSIS — Z951 Presence of aortocoronary bypass graft: Secondary | ICD-10-CM | POA: Diagnosis not present

## 2016-10-18 DIAGNOSIS — Z79899 Other long term (current) drug therapy: Secondary | ICD-10-CM | POA: Diagnosis not present

## 2016-10-18 DIAGNOSIS — I350 Nonrheumatic aortic (valve) stenosis: Secondary | ICD-10-CM | POA: Diagnosis not present

## 2016-10-18 DIAGNOSIS — E785 Hyperlipidemia, unspecified: Secondary | ICD-10-CM | POA: Diagnosis not present

## 2016-10-18 DIAGNOSIS — I480 Paroxysmal atrial fibrillation: Secondary | ICD-10-CM | POA: Diagnosis not present

## 2016-10-18 DIAGNOSIS — I255 Ischemic cardiomyopathy: Secondary | ICD-10-CM | POA: Diagnosis not present

## 2016-10-18 DIAGNOSIS — I10 Essential (primary) hypertension: Secondary | ICD-10-CM | POA: Diagnosis not present

## 2016-10-24 DIAGNOSIS — I480 Paroxysmal atrial fibrillation: Secondary | ICD-10-CM | POA: Diagnosis not present

## 2016-11-16 DIAGNOSIS — I480 Paroxysmal atrial fibrillation: Secondary | ICD-10-CM | POA: Diagnosis not present

## 2016-11-28 DIAGNOSIS — I482 Chronic atrial fibrillation: Secondary | ICD-10-CM | POA: Diagnosis not present

## 2016-11-28 DIAGNOSIS — E039 Hypothyroidism, unspecified: Secondary | ICD-10-CM | POA: Diagnosis not present

## 2016-11-28 DIAGNOSIS — I350 Nonrheumatic aortic (valve) stenosis: Secondary | ICD-10-CM | POA: Diagnosis not present

## 2016-11-28 DIAGNOSIS — Z0000 Encounter for general adult medical examination without abnormal findings: Secondary | ICD-10-CM | POA: Diagnosis not present

## 2016-11-28 DIAGNOSIS — Z125 Encounter for screening for malignant neoplasm of prostate: Secondary | ICD-10-CM | POA: Diagnosis not present

## 2016-11-28 DIAGNOSIS — E785 Hyperlipidemia, unspecified: Secondary | ICD-10-CM | POA: Diagnosis not present

## 2016-11-28 DIAGNOSIS — Z79899 Other long term (current) drug therapy: Secondary | ICD-10-CM | POA: Diagnosis not present

## 2016-11-28 DIAGNOSIS — I10 Essential (primary) hypertension: Secondary | ICD-10-CM | POA: Diagnosis not present

## 2016-11-28 DIAGNOSIS — E119 Type 2 diabetes mellitus without complications: Secondary | ICD-10-CM | POA: Diagnosis not present

## 2016-12-05 DIAGNOSIS — Z951 Presence of aortocoronary bypass graft: Secondary | ICD-10-CM | POA: Diagnosis not present

## 2016-12-05 DIAGNOSIS — I350 Nonrheumatic aortic (valve) stenosis: Secondary | ICD-10-CM | POA: Diagnosis not present

## 2016-12-05 DIAGNOSIS — I480 Paroxysmal atrial fibrillation: Secondary | ICD-10-CM | POA: Diagnosis not present

## 2016-12-14 DIAGNOSIS — H40153 Residual stage of open-angle glaucoma, bilateral: Secondary | ICD-10-CM | POA: Diagnosis not present

## 2016-12-14 DIAGNOSIS — I480 Paroxysmal atrial fibrillation: Secondary | ICD-10-CM | POA: Diagnosis not present

## 2017-01-11 DIAGNOSIS — I480 Paroxysmal atrial fibrillation: Secondary | ICD-10-CM | POA: Diagnosis not present

## 2017-01-16 DIAGNOSIS — I480 Paroxysmal atrial fibrillation: Secondary | ICD-10-CM | POA: Diagnosis not present

## 2017-02-15 NOTE — Assessment & Plan Note (Signed)
On anticoagulation 

## 2017-02-15 NOTE — Assessment & Plan Note (Signed)
blood pressure control important in reducing the progression of atherosclerotic disease and aneurysmal disease. On appropriate oral medications.  

## 2017-02-15 NOTE — Assessment & Plan Note (Signed)
blood glucose control important in reducing the progression of atherosclerotic disease. Also, involved in wound healing. On appropriate medications.  

## 2017-02-15 NOTE — Progress Notes (Signed)
MRN : [id number]  Derrick Cochran is a 82 y.o. ([birth year]) male who presents with chief complaint of  Chief Complaint  Patient presents with  . AAA    33yr follow up  .  History of Present Illness: Patient returns today in follow up of his abdominal aortic aneurysm.  He is doing well.  He denies any aneurysm related symptoms. Specifically, the patient denies new back or abdominal pain, or signs of peripheral embolization.  His aortic duplex today demonstrates a stable, 3.2 cm infrarenal abdominal aortic aneurysm without significant growth from his previous study.  Current Outpatient Prescriptions  Medication Sig Dispense Refill  . acetaminophen (TYLENOL) 500 MG tablet Take 500 mg by mouth every 6 (six) hours as needed for mild pain, moderate pain or fever.     Marland Kitchen aspirin EC 81 MG EC tablet Take 1 tablet (81 mg total) by mouth daily.    . celecoxib (CELEBREX) 200 MG capsule Take 200 mg by mouth daily.    . cyanocobalamin 1000 MCG tablet Take 1,000 mcg by mouth daily.    . enalapril (VASOTEC) 5 MG tablet Take 1 tablet (5 mg total) by mouth daily. 30 tablet 0  . furosemide (LASIX) 20 MG tablet Take 20 mg by mouth every morning.     . gabapentin (NEURONTIN) 300 MG capsule Take 300 mg by mouth daily.     Marland Kitchen glipiZIDE (GLUCOTROL XL) 5 MG 24 hr tablet Take 5 mg by mouth every morning.    Marland Kitchen levothyroxine (SYNTHROID, LEVOTHROID) 137 MCG tablet Take 137 mcg by mouth daily before breakfast.     . loratadine (CLARITIN) 10 MG tablet Take 1 tablet by mouth daily.    . metFORMIN (GLUCOPHAGE) 500 MG tablet Take 500 mg by mouth 2 (two) times daily.     . metoprolol succinate (TOPROL-XL) 25 MG 24 hr tablet Take 25 mg by mouth daily.    . nitroGLYCERIN (NITROSTAT) 0.4 MG SL tablet Place 0.4 mg under the tongue every 5 (five) minutes as needed for chest pain.    Marland Kitchen omeprazole (PRILOSEC) 20 MG capsule Take 20 mg by mouth every morning.     . simvastatin (ZOCOR) 40 MG tablet Take 40  mg by mouth every evening.     . sotalol (BETAPACE) 120 MG tablet Take 1 tablet (120 mg total) by mouth 2 (two) times daily. 60 tablet 0  . tamsulosin (FLOMAX) 0.4 MG CAPS capsule Take 0.4 mg by mouth every morning.     . temazepam (RESTORIL) 15 MG capsule Take 15 mg by mouth at bedtime.     Marland Kitchen warfarin (COUMADIN) 1 MG tablet Take 1 mg by mouth every evening. Pt takes with 3mg  tablet for a total dose of 4mg .    . warfarin (COUMADIN) 3 MG tablet Take 3 mg by mouth every evening. Pt takes with 1mg  tablet for a total dose of 4mg .     No current facility-administered medications for this visit.         Past Medical History:  Diagnosis Date  . AAA (abdominal aortic aneurysm) ([place])   . Arrhythmia    atrial fibrillation  . Arthritis   . BPH (benign prostatic hyperplasia)   . Cancer ([place])    skin  . CHF (congestive heart failure) ([place])   . Coronary artery disease   . DDD (degenerative disc disease), thoracic   . Diabetes mellitus without complication ([place])   . Esophageal stricture   . GERD (gastroesophageal reflux disease)   .  Heart murmur   . Hyperlipidemia   . Hypertension   . Hypothyroidism   . Myocardial infarction   . Shortness of breath dyspnea          Past Surgical History:  Procedure Laterality Date  . AORTIC VALVE REPLACEMENT  1995   Bioprosthetic - [place]  . APPENDECTOMY    . CARDIAC CATHETERIZATION  1994 x 2 with PCTA of RCA  . CARDIAC VALVE REPLACEMENT     Bovine transcatheter heart valve  . COLONOSCOPY  2015  . CORONARY ARTERY BYPASS GRAFT  1995   x 4 Vessels - [facility]  . ELECTROPHYSIOLOGIC STUDY N/A 08/06/2014   Procedure: Cardioversion;  Surgeon: [name], MD;  Location: [place] [facility];  Service: Cardiovascular;  Laterality: N/A;  . ELECTROPHYSIOLOGIC STUDY N/A 06/18/2014   Procedure: CARDIOVERSION;  Surgeon: [name], MD;  Location: [facility];  Service: Cardiovascular;  Laterality: N/A;  .  ESOPHAGOGASTRODUODENOSCOPY  2014  . GREAT TOE ARTHRODESIS, INTERPHALANGEAL JOINT Left   . HERNIA REPAIR Left    Inguinal  . PACEMAKER INSERTION N/A 07/02/2014   Procedure: INSERTION PACEMAKER/DUAL CHAMBER  INITIAL IMPLANT;  Surgeon: [name], MD;  Location: [facility];  Service: Cardiovascular;  Laterality: N/A;    Social History Social History  Substance Use Topics  . Smoking status: Former Smoker    Packs/day: 0.50    Years: 15.00    Types: Cigarettes    Quit date: 04/22/1985  . Smokeless tobacco: Former Systems developer    Types: Chew  . Alcohol use No     Family History      Family History  Problem Relation Age of Onset  . Cancer Mother   . Cancer Father   . Kidney disease Sister   . Heart failure Brother           Allergies  Allergen Reactions  . Cefdinir Other (See Comments)    nightmares  . Adhesive [Tape] Rash  . Sulfa Antibiotics Rash  . Sulfamethoxazole Hives, Itching and Rash     REVIEW OF SYSTEMS (Negative unless checked)  Constitutional: [] Weight loss  [] Fever  [] Chills Cardiac: [] Chest pain   [] Chest pressure   [x] Palpitations   [] Shortness of breath when laying flat   [] Shortness of breath at rest   [] Shortness of breath with exertion. Vascular:  [] Pain in legs with walking   [] Pain in legs at rest   [] Pain in legs when laying flat   [] Claudication   [] Pain in feet when walking  [] Pain in feet at rest  [] Pain in feet when laying flat   [] History of DVT   [] Phlebitis   [] Swelling in legs   [] Varicose veins   [] Non-healing ulcers Pulmonary:   [] Uses home oxygen   [] Productive cough   [] Hemoptysis   [] Wheeze  [] COPD   [] Asthma Neurologic:  [] Dizziness  [] Blackouts   [] Seizures   [] History of stroke   [] History of TIA  [] Aphasia   [] Temporary blindness   [] Dysphagia   [] Weakness or numbness in arms   [] Weakness or numbness in legs Musculoskeletal:  [x] Arthritis   [] Joint swelling   [] Joint pain   [] Low back pain Hematologic:   [] Easy bruising  [] Easy bleeding   [] Hypercoagulable state   [] Anemic   Gastrointestinal:  [] Blood in stool   [] Vomiting blood  [] Gastroesophageal reflux/heartburn   [] Abdominal pain Genitourinary:  [] Chronic kidney disease   [] Difficult urination  [] Frequent urination  [] Burning with urination   [] Hematuria Skin:  [] Rashes   [] Ulcers   [] Wounds  Psychological:  [] History of anxiety   []  History of major depression.     Physical Examination  BP 107/66 (BP Location: Left Arm)   Pulse 82   Resp 16   Wt 77.5 kg (170 lb 12.8 oz)   BMI 24.51 kg/m  Gen:  WD/WN, NAD Head: [place]/AT, No temporalis wasting. Ear/Nose/Throat: Hearing grossly intact, nares w/o erythema or drainage, trachea midline Eyes: Conjunctiva clear. Sclera non-icteric Neck: Supple.  No JVD.  Pulmonary:  Good air movement, no use of accessory muscles.  Cardiac: RRR, normal S1, S2 Vascular:  Vessel Right Left  Radial Palpable Palpable                                   Gastrointestinal: soft, non-tender/non-distended. No guarding/reflex.  Musculoskeletal: M/S 5/5 throughout.  No deformity or atrophy. no edema. Neurologic: Sensation grossly intact in extremities.  Symmetrical.  Speech is fluent.  Psychiatric: Judgment intact, Mood & affect appropriate for pt's clinical situation. Dermatologic: No rashes or ulcers noted.  No cellulitis or open wounds.       Labs No results found for this or any previous visit (from the past 2160 hour(s)).  Radiology No results found.    Assessment/Plan  Atrial fibrillation On anticoagulation  HTN (hypertension) blood pressure control important in reducing the progression of atherosclerotic disease and aneurysmal disease. On appropriate oral medications.   Diabetes ([place]) blood glucose control important in reducing the progression of atherosclerotic disease. Also, involved in wound healing. On appropriate medications.   AAA (abdominal aortic aneurysm) without rupture  (HCC) His aortic duplex today demonstrates a stable, 3.2 cm infrarenal abdominal aortic aneurysm without significant growth from his previous study. No surgery or intervention at this time. The patient has an asymptomatic abdominal aortic aneurysm that is less than 4 cm in maximal diameter.  I have discussed the natural history of abdominal aortic aneurysm and the small risk of rupture for aneurysm less than 5 cm in size.  However, as these small aneurysms tend to enlarge over time, continued surveillance with ultrasound or CT scan is mandatory.  I have also discussed optimizing medical management with hypertension and lipid control and the importance of abstinence from tobacco.  The patient is also encouraged to exercise a minimum of 30 minutes 4 times a week.  Should the patient develop new onset abdominal or back pain or signs of peripheral embolization they are instructed to seek medical attention immediately and to alert the physician providing care that they have an aneurysm.  The patient voices their understanding. The patient will return in 12 months with an aortic duplex.     Leotis Pain, MD  02/16/2017 10:59 AM    This note was created with Dragon medical transcription system.  Any errors from dictation are purely unintentional

## 2017-02-16 ENCOUNTER — Ambulatory Visit (INDEPENDENT_AMBULATORY_CARE_PROVIDER_SITE_OTHER): Payer: Commercial Managed Care - HMO | Admitting: Vascular Surgery

## 2017-02-16 ENCOUNTER — Ambulatory Visit (INDEPENDENT_AMBULATORY_CARE_PROVIDER_SITE_OTHER): Payer: Medicare HMO

## 2017-02-16 ENCOUNTER — Encounter (INDEPENDENT_AMBULATORY_CARE_PROVIDER_SITE_OTHER): Payer: Self-pay | Admitting: Vascular Surgery

## 2017-02-16 VITALS — BP 107/66 | HR 82 | Resp 16 | Wt 170.8 lb

## 2017-02-16 DIAGNOSIS — I482 Chronic atrial fibrillation, unspecified: Secondary | ICD-10-CM

## 2017-02-16 DIAGNOSIS — I714 Abdominal aortic aneurysm, without rupture, unspecified: Secondary | ICD-10-CM

## 2017-02-16 DIAGNOSIS — E119 Type 2 diabetes mellitus without complications: Secondary | ICD-10-CM

## 2017-02-16 DIAGNOSIS — I10 Essential (primary) hypertension: Secondary | ICD-10-CM

## 2017-02-16 NOTE — Patient Instructions (Signed)
Abdominal Aortic Aneurysm Blood pumps away from the heart through tubes (blood vessels) called arteries. Aneurysms are weak or damaged places in the wall of an artery. It bulges out like a balloon. An abdominal aortic aneurysm happens in the main artery of the body (aorta). It can burst or tear, causing bleeding inside the body. This is an emergency. It needs treatment right away. What are the causes? The exact cause is unknown. Things that could cause this problem include:  Fat and other substances building up in the lining of a tube.  Swelling of the walls of a blood vessel.  Certain tissue diseases.  Belly (abdominal) trauma.  An infection in the main artery of the body.  What increases the risk? There are things that make it more likely for you to have an aneurysm. These include:  Being over the age of 80 years old.  Having high blood pressure (hypertension).  Being a male.  Being white.  Being very overweight (obese).  Having a family history of aneurysm.  Using tobacco products.  What are the signs or symptoms? Symptoms depend on the size of the aneurysm and how fast it grows. There may not be symptoms. If symptoms occur, they can include:  Pain (belly, side, lower back, or groin).  Feeling full after eating a small amount of food.  Feeling sick to your stomach (nauseous), throwing up (vomiting), or both.  Feeling a lump in your belly that feels like it is beating (pulsating).  Feeling like you will pass out (faint).  How is this treated?  Medicine to control blood pressure and pain.  Imaging tests to see if the aneurysm gets bigger.  Surgery. How is this prevented? To lessen your chance of getting this condition:  Stop smoking. Stop chewing tobacco.  Limit or avoid alcohol.  Keep your blood pressure, blood sugar, and cholesterol within normal limits.  Eat less salt.  Eat foods low in saturated fats and cholesterol. These are found in animal and  whole dairy products.  Eat more fiber. Fiber is found in whole grains, vegetables, and fruits.  Keep a healthy weight.  Stay active and exercise often.  This information is not intended to replace advice given to you by your health care provider. Make sure you discuss any questions you have with your health care provider. Document Released: 05/27/2012 Document Revised: 07/08/2015 Document Reviewed: 03/01/2012 Elsevier Interactive Patient Education  2017 [employer]  

## 2017-02-16 NOTE — Assessment & Plan Note (Signed)
His aortic duplex today demonstrates a stable, 3.2 cm infrarenal abdominal aortic aneurysm without significant growth from his previous study. No surgery or intervention at this time. The patient has an asymptomatic abdominal aortic aneurysm that is less than 4 cm in maximal diameter.  I have discussed the natural history of abdominal aortic aneurysm and the small risk of rupture for aneurysm less than 5 cm in size.  However, as these small aneurysms tend to enlarge over time, continued surveillance with ultrasound or CT scan is mandatory.  I have also discussed optimizing medical management with hypertension and lipid control and the importance of abstinence from tobacco.  The patient is also encouraged to exercise a minimum of 30 minutes 4 times a week.  Should the patient develop new onset abdominal or back pain or signs of peripheral embolization they are instructed to seek medical attention immediately and to alert the physician providing care that they have an aneurysm.  The patient voices their understanding. The patient will return in 12 months with an aortic duplex.

## 2017-03-01 DIAGNOSIS — I480 Paroxysmal atrial fibrillation: Secondary | ICD-10-CM | POA: Diagnosis not present

## 2017-03-11 IMAGING — CT CT ABD-PELV W/ CM
2 of 5 series · 16 of 46 positions shown, 18 images · IV contrast (omnipaque)
Comparison: None.

CLINICAL DATA: Lower abdominal pain, difficulty urinating. Prior
appendectomy. History of BPH.

EXAM:
CT ABDOMEN AND PELVIS WITH CONTRAST
TECHNIQUE: Multidetector CT imaging of the abdomen and pelvis was performed
using the standard protocol following bolus administration of
intravenous contrast.
CONTRAST:  100 mL Omnipaque 300 IV

[Series 2: routine abd pel with · axial · 0.75mm/px · z∈[-1096,-646]mm · 13 of 102 slices shown, 15 images]
[im 6/102  soft-tissue]
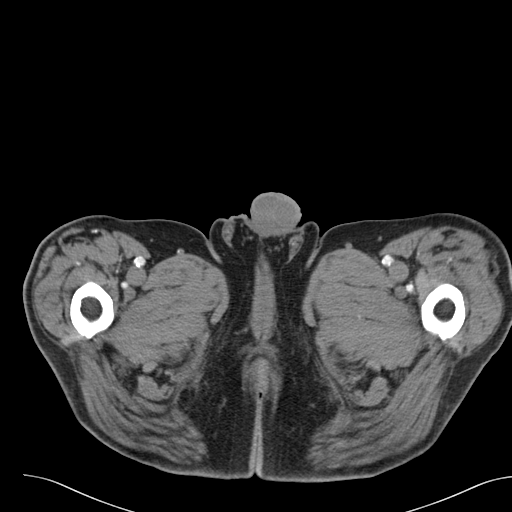
[im 6/102  bone]
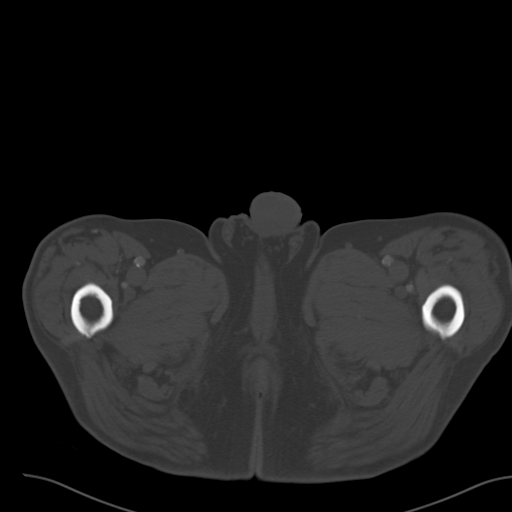
[im 16/102  soft-tissue]
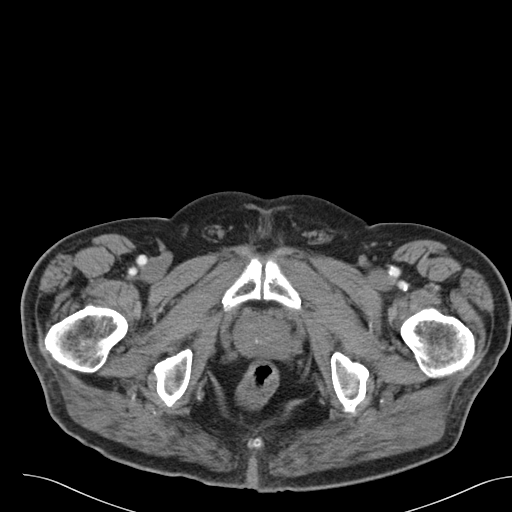
[im 22/102  soft-tissue]
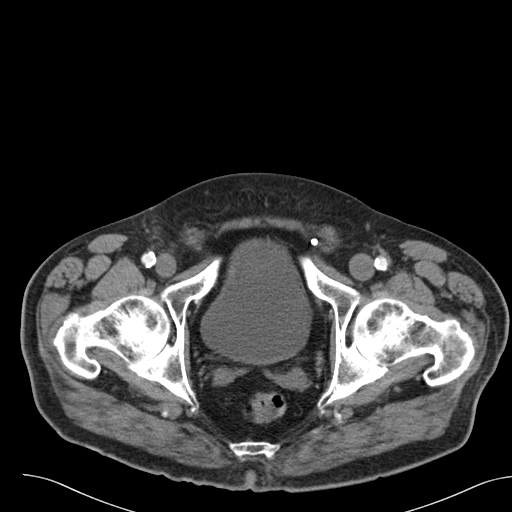
[im 27/102  soft-tissue]
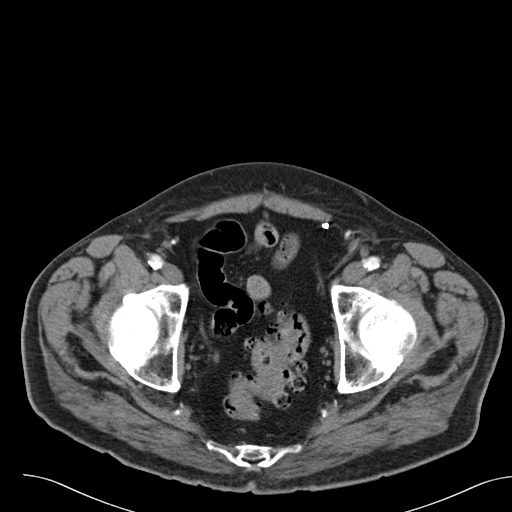
[im 38/102  soft-tissue]
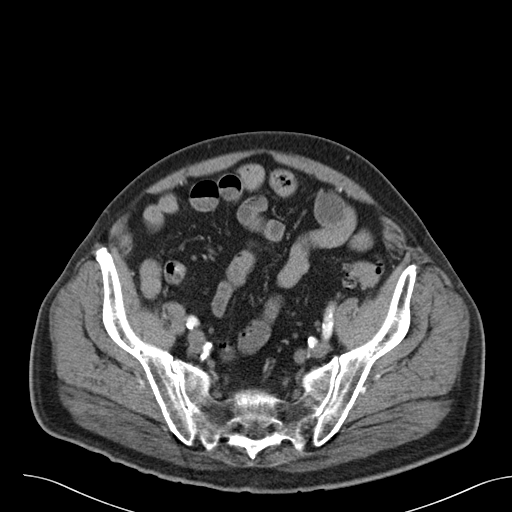
[im 43/102  soft-tissue]
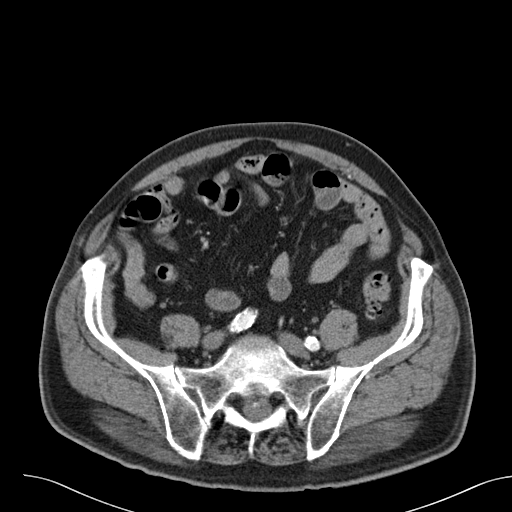
[im 54/102  soft-tissue]
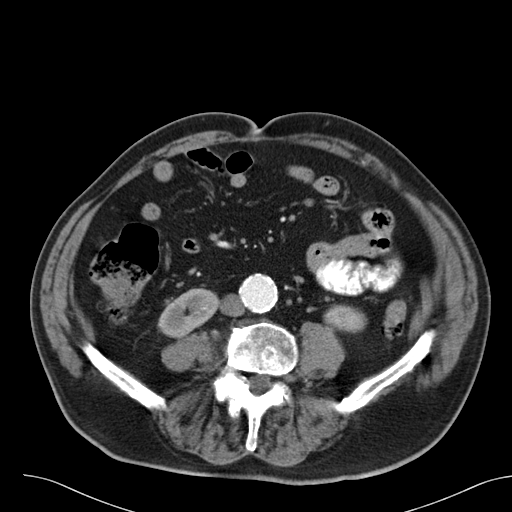
[im 59/102  soft-tissue]
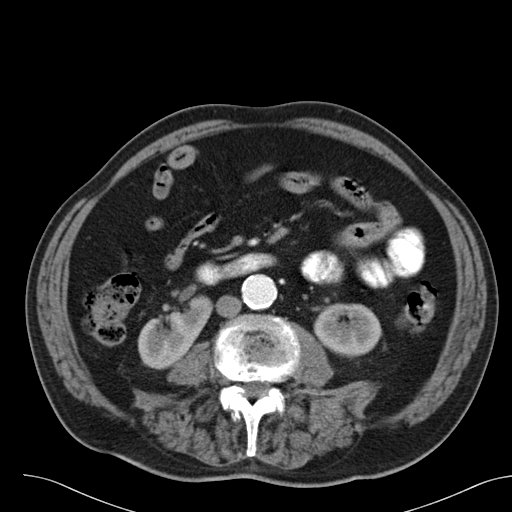
[im 64/102  soft-tissue]
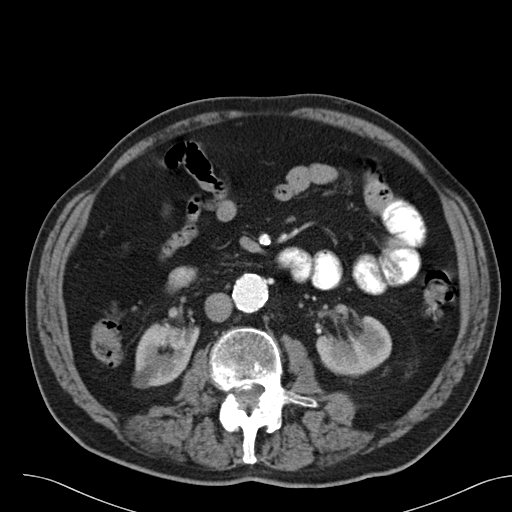
[im 64/102  bone]
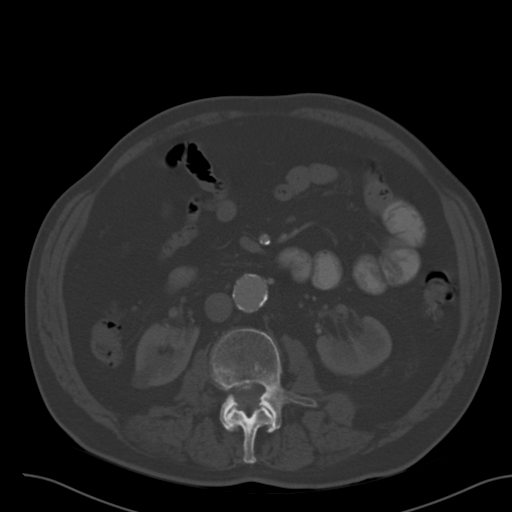
[im 75/102  soft-tissue]
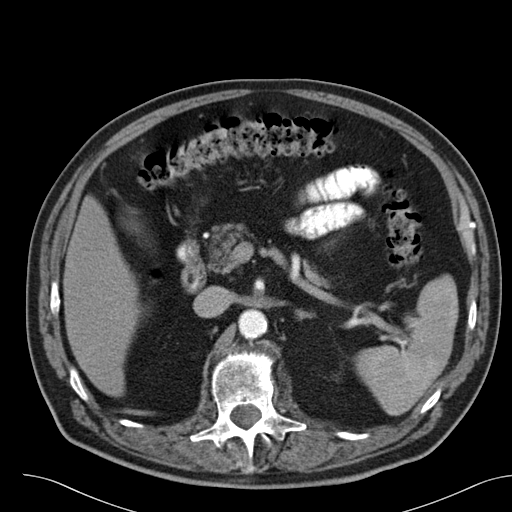
[im 80/102  soft-tissue]
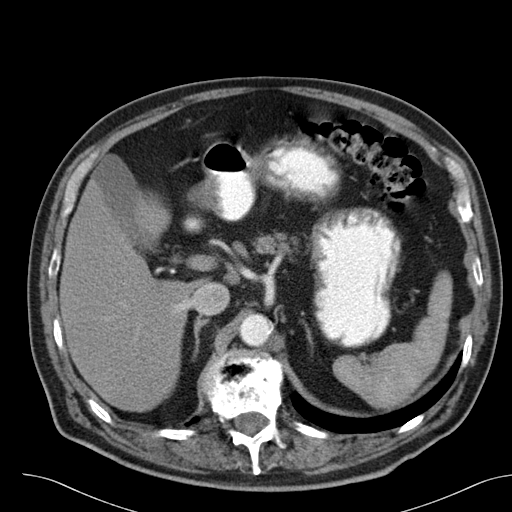
[im 86/102  soft-tissue]
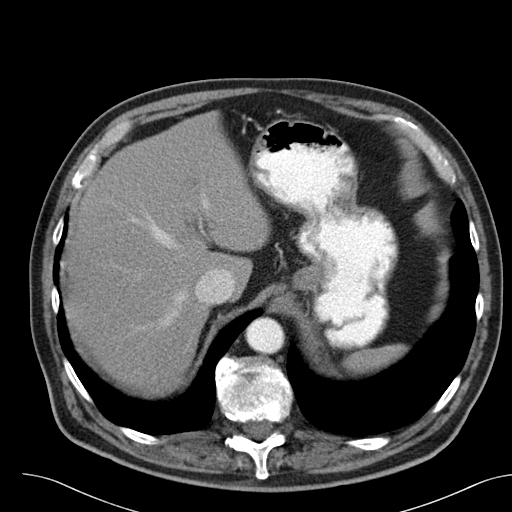
[im 96/102  soft-tissue]
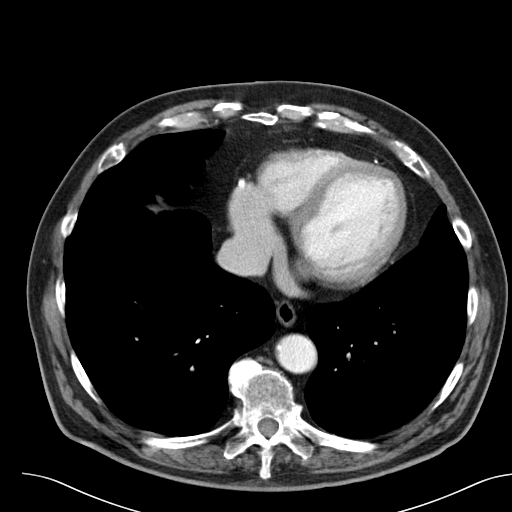

[Series 6: cor routine abd pel with · coronal · 0.75mm/px · 3 of 151 slices shown]
[im 51/151  soft-tissue]
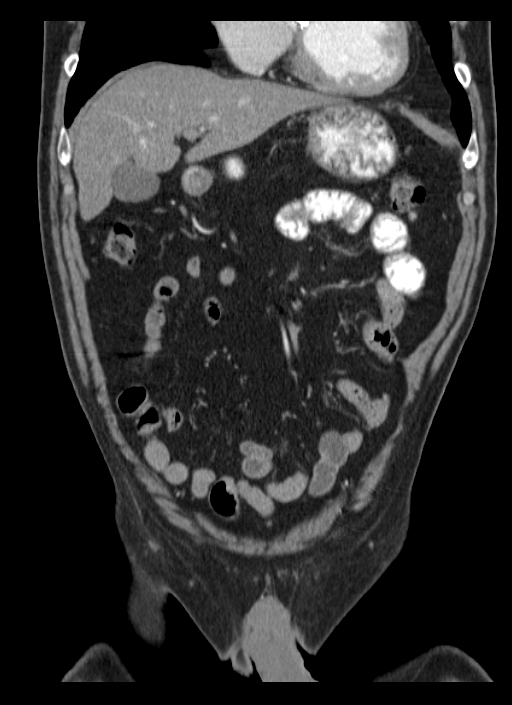
[im 67/151  soft-tissue]
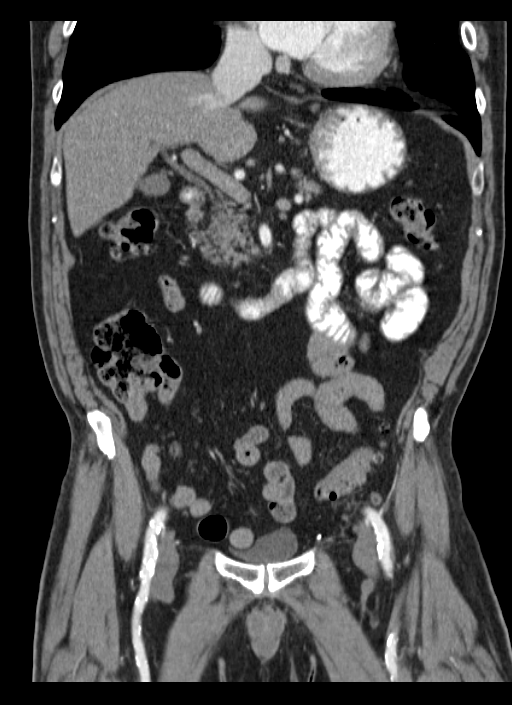
[im 84/151  soft-tissue]
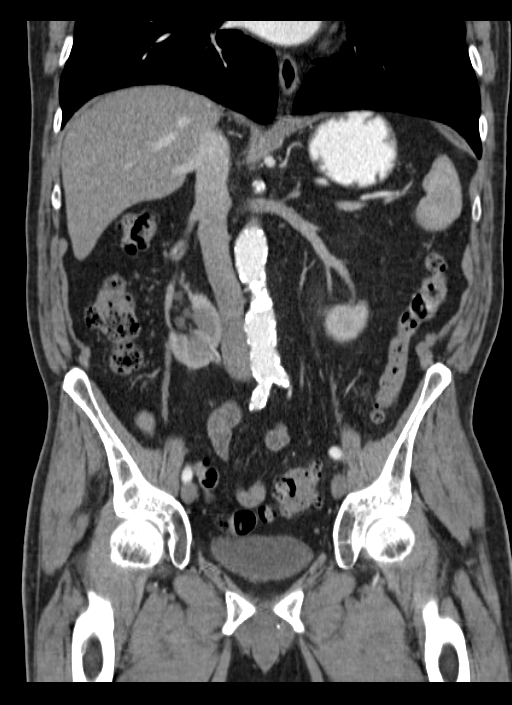

[16 of 46 positions shown; findings below may reference images not displayed]

FINDINGS: Lower chest:  Lung bases are essentially clear.

Hepatobiliary: Liver is within normal limits.

Gallbladder is unremarkable. No intrahepatic or extrahepatic ductal
dilatation.

Pancreas: Within normal limits.

Spleen: Within normal limits.

Adrenals/Urinary Tract: Adrenal glands are within normal limits.

Kidneys are malrotated. 2.0 cm posterior interpolar right renal
cyst.

2 mm distal left ureteral calculus (series 2/ image 79). No
hydronephrosis.

Bladder is within normal limits.

Stomach/Bowel: Stomach is within normal limits.

No evidence of bowel obstruction.

Prior appendectomy.

Colonic diverticulosis, without evidence of diverticulitis.

Vascular/Lymphatic: Atherosclerotic calcifications of the abdominal
aorta and branch vessels.

No suspicious abdominopelvic lymphadenopathy.

Reproductive: Mild prominence of the central gland of the prostate
(series 2/ image 85), which indents the base of the bladder,
compatible with known BPH.

Other: No abdominopelvic ascites.

Postsurgical changes in the left inguinal region.

Musculoskeletal: Degenerative changes of the visualized
thoracolumbar spine.
IMPRESSION: 2 mm distal left ureteral calculus.  No hydronephrosis.

Enlargement of the central gland of the prostate, compatible with
known BPH.

## 2017-03-22 DIAGNOSIS — I480 Paroxysmal atrial fibrillation: Secondary | ICD-10-CM | POA: Diagnosis not present

## 2017-04-19 DIAGNOSIS — I480 Paroxysmal atrial fibrillation: Secondary | ICD-10-CM | POA: Diagnosis not present

## 2017-05-03 DIAGNOSIS — I495 Sick sinus syndrome: Secondary | ICD-10-CM | POA: Diagnosis not present

## 2017-05-10 DIAGNOSIS — I480 Paroxysmal atrial fibrillation: Secondary | ICD-10-CM | POA: Diagnosis not present

## 2017-05-30 DIAGNOSIS — E039 Hypothyroidism, unspecified: Secondary | ICD-10-CM | POA: Diagnosis not present

## 2017-05-30 DIAGNOSIS — Z79899 Other long term (current) drug therapy: Secondary | ICD-10-CM | POA: Diagnosis not present

## 2017-05-30 DIAGNOSIS — R0602 Shortness of breath: Secondary | ICD-10-CM | POA: Diagnosis not present

## 2017-05-30 DIAGNOSIS — E7800 Pure hypercholesterolemia, unspecified: Secondary | ICD-10-CM | POA: Diagnosis not present

## 2017-05-30 DIAGNOSIS — M25551 Pain in right hip: Secondary | ICD-10-CM | POA: Diagnosis not present

## 2017-05-30 DIAGNOSIS — I495 Sick sinus syndrome: Secondary | ICD-10-CM | POA: Diagnosis not present

## 2017-05-30 DIAGNOSIS — I10 Essential (primary) hypertension: Secondary | ICD-10-CM | POA: Diagnosis not present

## 2017-05-30 DIAGNOSIS — I714 Abdominal aortic aneurysm, without rupture: Secondary | ICD-10-CM | POA: Diagnosis not present

## 2017-05-30 DIAGNOSIS — E119 Type 2 diabetes mellitus without complications: Secondary | ICD-10-CM | POA: Diagnosis not present

## 2017-05-30 DIAGNOSIS — Z0000 Encounter for general adult medical examination without abnormal findings: Secondary | ICD-10-CM | POA: Diagnosis not present

## 2017-05-30 DIAGNOSIS — M1611 Unilateral primary osteoarthritis, right hip: Secondary | ICD-10-CM | POA: Diagnosis not present

## 2017-05-30 DIAGNOSIS — I482 Chronic atrial fibrillation: Secondary | ICD-10-CM | POA: Diagnosis not present

## 2017-06-11 DIAGNOSIS — Z1211 Encounter for screening for malignant neoplasm of colon: Secondary | ICD-10-CM | POA: Diagnosis not present

## 2017-06-15 DIAGNOSIS — D485 Neoplasm of uncertain behavior of skin: Secondary | ICD-10-CM | POA: Diagnosis not present

## 2017-06-15 DIAGNOSIS — L570 Actinic keratosis: Secondary | ICD-10-CM | POA: Diagnosis not present

## 2017-06-15 DIAGNOSIS — C44329 Squamous cell carcinoma of skin of other parts of face: Secondary | ICD-10-CM | POA: Diagnosis not present

## 2017-06-15 DIAGNOSIS — X32XXXA Exposure to sunlight, initial encounter: Secondary | ICD-10-CM | POA: Diagnosis not present

## 2017-06-24 NOTE — Progress Notes (Signed)
Patient ID: Derrick Cochran, male    DOB: [birth year], 82 y.o.   MRN: [id number]  HPI  Derrick Cochran is a 82 y/o male with a history of MI, hypothyroidism, HTN, hyperlipidemia, GERD, DM, BPH, atrial fibrillation, AAA, remote tobacco use and chronic heart failure.   Last echo was done 11/29/15 which showed an EF of 30-35% with bioprosthesis valve present. Also has mild Derrick. This is an improvement from his EF of 20-25% on 04/11/15. Last catheterization was done 12/04/13. CABG done 1995.  He has not been admitted or been in the ED in the last 6 months.   He presents today for a follow-up visit with a chief complaint of moderate fatigue upon minimal exertion. He describes this as chronic in nature having been present for several years. He does feel like his energy is declining and that he gets tired "doing nothing". He has associated shortness of breath, light-headedness, right hip pain and difficulty sleeping along with this. He denies any abdominal distention, palpitations, pedal edema, chest pain or weight gain.  Past Medical History:  Diagnosis Date  . AAA (abdominal aortic aneurysm) ([place])   . Arrhythmia    atrial fibrillation  . Arthritis   . BPH (benign prostatic hyperplasia)   . Cancer ([place])    skin  . CHF (congestive heart failure) ([place])   . Coronary artery disease   . DDD (degenerative disc disease), thoracic   . Diabetes mellitus without complication ([place])   . Esophageal stricture   . GERD (gastroesophageal reflux disease)   . Heart murmur   . Hyperlipidemia   . Hypertension   . Hypothyroidism   . Myocardial infarction ([place])   . Shortness of breath dyspnea     Past Surgical History:  Procedure Laterality Date  . AORTIC VALVE REPLACEMENT  1995   Bioprosthetic - [facility]  . APPENDECTOMY    . CARDIAC CATHETERIZATION  1994 x 2 with PCTA of RCA  . CARDIAC VALVE REPLACEMENT     Bovine transcatheter heart valve  . COLONOSCOPY  2015  . CORONARY ARTERY BYPASS GRAFT  1995   x 4  Vessels - [facility]  . ELECTROPHYSIOLOGIC STUDY N/A 08/06/2014   Procedure: Cardioversion;  Surgeon: [name], MD;  Location: [facility];  Service: Cardiovascular;  Laterality: N/A;  . ELECTROPHYSIOLOGIC STUDY N/A 06/18/2014   Procedure: CARDIOVERSION;  Surgeon: [name], MD;  Location: [facility];  Service: Cardiovascular;  Laterality: N/A;  . ESOPHAGOGASTRODUODENOSCOPY  2014  . GREAT TOE ARTHRODESIS, INTERPHALANGEAL JOINT Left   . HERNIA REPAIR Left    Inguinal  . PACEMAKER INSERTION N/A 07/02/2014   Procedure: INSERTION PACEMAKER/DUAL CHAMBER  INITIAL IMPLANT;  Surgeon: [name], MD;  Location: [facility];  Service: Cardiovascular;  Laterality: N/A;    Family History  Problem Relation Age of Onset  . Cancer Mother   . Cancer Father   . Kidney disease Sister   . Heart failure Brother     Social History   Tobacco Use  . Smoking status: Former Smoker    Packs/day: 0.50    Years: 15.00    Pack years: 7.50    Types: Cigarettes    Last attempt to quit: 04/22/1985    Years since quitting: 32.1  . Smokeless tobacco: Former Systems developer    Types: Chew  Substance Use Topics  . Alcohol use: No    Allergies  Allergen Reactions  . Cefdinir Other (See Comments)    nightmares  . Adhesive [Tape] Rash  .  Sulfa Antibiotics Rash  . Sulfamethoxazole Hives, Itching and Rash    Prior to Admission medications   Medication Sig Start Date End Date Taking? Authorizing Provider  acetaminophen (TYLENOL) 500 MG tablet Take 500 mg by mouth every 6 (six) hours as needed for mild pain, moderate pain or fever.    Yes [provider]  celecoxib (CELEBREX) 200 MG capsule Take 200 mg by mouth daily.   Yes [provider]  cyanocobalamin 1000 MCG tablet Take 1,000 mcg by mouth as needed.    Yes [provider]  dapagliflozin propanediol (FARXIGA) 10 MG TABS tablet Take 10 mg by mouth daily. Study drug   Yes [provider]  enalapril  (VASOTEC) 5 MG tablet Take 1 tablet (5 mg total) by mouth daily. Patient taking differently: Take 5 mg by mouth 2 (two) times daily.  11/30/15  Yes Mody, Ulice Bold, MD  furosemide (LASIX) 20 MG tablet Take 20 mg by mouth every morning.    Yes [provider]  gabapentin (NEURONTIN) 300 MG capsule Take 300 mg by mouth daily.    Yes [provider]  glipiZIDE (GLUCOTROL XL) 5 MG 24 hr tablet Take 5 mg by mouth every morning. 01/21/15  Yes [provider]  levothyroxine (SYNTHROID, LEVOTHROID) 112 MCG tablet Take 112 mcg by mouth daily before breakfast.   Yes [provider]  metFORMIN (GLUCOPHAGE) 500 MG tablet Take 500 mg by mouth 2 (two) times daily.    Yes [provider]  metoprolol succinate (TOPROL-XL) 25 MG 24 hr tablet Take 25 mg by mouth daily.   Yes [provider]  nitroGLYCERIN (NITROSTAT) 0.4 MG SL tablet Place 0.4 mg under the tongue every 5 (five) minutes as needed for chest pain.   Yes [provider]  omeprazole (PRILOSEC) 20 MG capsule Take 20 mg by mouth every morning.    Yes [provider]  simvastatin (ZOCOR) 40 MG tablet Take 40 mg by mouth every evening.    Yes [provider]  sotalol (BETAPACE) 120 MG tablet Take 1 tablet (120 mg total) by mouth 2 (two) times daily. 11/30/15  Yes Mody, Ulice Bold, MD  tamsulosin (FLOMAX) 0.4 MG CAPS capsule Take 0.4 mg by mouth every morning.    Yes [provider]  temazepam (RESTORIL) 15 MG capsule Take 15 mg by mouth at bedtime.    Yes [provider]  warfarin (COUMADIN) 1 MG tablet Take 1 mg by mouth every evening. Pt takes with 3mg  tablet for a total dose of 4mg .   Yes [provider]  warfarin (COUMADIN) 3 MG tablet Take 3 mg by mouth every evening. Pt takes with 1mg  tablet for a total dose of 4mg .   Yes [provider]  aspirin EC 81 MG EC tablet Take 1 tablet (81 mg total) by mouth daily. Patient not taking: Reported on  06/27/2017 04/13/15   Dustin Flock, MD  loratadine (CLARITIN) 10 MG tablet Take 1 tablet by mouth daily. 05/21/15 06/27/16  [provider]    Review of Systems  Constitutional: Positive for fatigue. Negative for appetite change.  HENT: Positive for rhinorrhea. Negative for congestion and sore throat.   Eyes: Negative.   Respiratory: Positive for shortness of breath. Negative for cough and chest tightness.   Cardiovascular: Negative for chest pain, palpitations and leg swelling.  Gastrointestinal: Negative for abdominal distention and abdominal pain.  Endocrine: Negative.   Genitourinary: Negative.   Musculoskeletal: Positive for arthralgias (right hip pain). Negative for  back pain and neck pain.  Skin: Negative.   Allergic/Immunologic: Negative.   Neurological: Positive for light-headedness. Negative for dizziness.  Hematological: Negative for adenopathy. Bruises/bleeds easily.  Psychiatric/Behavioral: Positive for sleep disturbance (sleeping on 1 pillow; waking up around 3-4am every morning). Negative for dysphoric mood. The patient is not nervous/anxious.    Vitals:   06/27/17 1020  BP: 118/60  Pulse: 68  Resp: 18  SpO2: 98%  Weight: 167 lb (75.8 kg)  Height: 6' (1.829 m)   Wt Readings from Last 3 Encounters:  06/27/17 167 lb (75.8 kg)  02/16/17 170 lb 12.8 oz (77.5 kg)  06/27/16 171 lb (77.6 kg)   Lab Results  Component Value Date   CREATININE 1.24 11/29/2015   CREATININE 1.46 (H) 11/28/2015   CREATININE 1.62 (H) 04/13/2015    Physical Exam  Constitutional: He is oriented to person, place, and time. He appears well-developed and well-nourished.  HENT:  Head: Normocephalic and atraumatic.  Neck: Normal range of motion. Neck supple.  Cardiovascular: Normal rate and regular rhythm.  Pulmonary/Chest: Effort normal. He has no wheezes. He has no rales.  Abdominal: Soft. He exhibits no distension. There is no tenderness.  Musculoskeletal: He exhibits no edema or  tenderness.  Neurological: He is alert and oriented to person, place, and time.  Skin: Skin is warm and dry.  Psychiatric: He has a normal mood and affect. His behavior is normal. Thought content normal.  Nursing note and vitals reviewed.  Assessment & Plan:  1: Chronic heart failure with reduced ejection fraction- - NYHA class III - euvolemic - weighing daily at home; Reminded to call for an overnight weight gain of >2 pounds or a weekly weight gain of >5 pounds - weight down 3 pounds since 02/16/17 - not adding salt to his food and trying to eat low sodium foods - patient has been hesitant to switch to entresto with previous discussions. Explained how entresto may help with his symptoms and he says that he will think about it. entresto brochure provided and he was instructed to call back if he'd like to try the medication - due to moderate fatigue already and HR in the 60's, will not titrate up metoprolol at this time - saw his cardiologist ([place]) on 10/18/16  2: HTN- - BP looks good today - continue medications at this time - saw PCP ([place]) on 05/30/17 - BMP on 05/30/17 reviewed and showed sodium 142, potassium 4.3 and GFR 64  3: Diabetes- - yesterday patient says his glucose was 155 nonfasting - A1c on 05/30/17 was 5.7%  Patient did not bring his medications nor a list. Each medication was verbally reviewed with the patient and he was encouraged to bring the bottles to every visit to confirm accuracy of list.  Return in 1 month or sooner for any questions/problems before then.

## 2017-06-27 ENCOUNTER — Encounter: Payer: Self-pay | Admitting: Family

## 2017-06-27 ENCOUNTER — Ambulatory Visit: Payer: Medicare HMO | Attending: Family | Admitting: Family

## 2017-06-27 VITALS — BP 118/60 | HR 68 | Resp 18 | Ht 72.0 in | Wt 167.0 lb

## 2017-06-27 DIAGNOSIS — Z87891 Personal history of nicotine dependence: Secondary | ICD-10-CM | POA: Diagnosis not present

## 2017-06-27 DIAGNOSIS — Z79899 Other long term (current) drug therapy: Secondary | ICD-10-CM | POA: Insufficient documentation

## 2017-06-27 DIAGNOSIS — E785 Hyperlipidemia, unspecified: Secondary | ICD-10-CM | POA: Diagnosis not present

## 2017-06-27 DIAGNOSIS — Z953 Presence of xenogenic heart valve: Secondary | ICD-10-CM | POA: Insufficient documentation

## 2017-06-27 DIAGNOSIS — I252 Old myocardial infarction: Secondary | ICD-10-CM | POA: Insufficient documentation

## 2017-06-27 DIAGNOSIS — K219 Gastro-esophageal reflux disease without esophagitis: Secondary | ICD-10-CM | POA: Insufficient documentation

## 2017-06-27 DIAGNOSIS — E119 Type 2 diabetes mellitus without complications: Secondary | ICD-10-CM | POA: Insufficient documentation

## 2017-06-27 DIAGNOSIS — Z7901 Long term (current) use of anticoagulants: Secondary | ICD-10-CM | POA: Diagnosis not present

## 2017-06-27 DIAGNOSIS — Z8249 Family history of ischemic heart disease and other diseases of the circulatory system: Secondary | ICD-10-CM | POA: Diagnosis not present

## 2017-06-27 DIAGNOSIS — I2510 Atherosclerotic heart disease of native coronary artery without angina pectoris: Secondary | ICD-10-CM | POA: Insufficient documentation

## 2017-06-27 DIAGNOSIS — Z881 Allergy status to other antibiotic agents status: Secondary | ICD-10-CM | POA: Insufficient documentation

## 2017-06-27 DIAGNOSIS — Z882 Allergy status to sulfonamides status: Secondary | ICD-10-CM | POA: Diagnosis not present

## 2017-06-27 DIAGNOSIS — Z79890 Hormone replacement therapy (postmenopausal): Secondary | ICD-10-CM | POA: Insufficient documentation

## 2017-06-27 DIAGNOSIS — Z841 Family history of disorders of kidney and ureter: Secondary | ICD-10-CM | POA: Diagnosis not present

## 2017-06-27 DIAGNOSIS — Z950 Presence of cardiac pacemaker: Secondary | ICD-10-CM | POA: Insufficient documentation

## 2017-06-27 DIAGNOSIS — N400 Enlarged prostate without lower urinary tract symptoms: Secondary | ICD-10-CM | POA: Insufficient documentation

## 2017-06-27 DIAGNOSIS — E039 Hypothyroidism, unspecified: Secondary | ICD-10-CM | POA: Insufficient documentation

## 2017-06-27 DIAGNOSIS — Z7984 Long term (current) use of oral hypoglycemic drugs: Secondary | ICD-10-CM | POA: Diagnosis not present

## 2017-06-27 DIAGNOSIS — I4891 Unspecified atrial fibrillation: Secondary | ICD-10-CM | POA: Diagnosis not present

## 2017-06-27 DIAGNOSIS — M1990 Unspecified osteoarthritis, unspecified site: Secondary | ICD-10-CM | POA: Diagnosis not present

## 2017-06-27 DIAGNOSIS — Z951 Presence of aortocoronary bypass graft: Secondary | ICD-10-CM | POA: Insufficient documentation

## 2017-06-27 DIAGNOSIS — I5022 Chronic systolic (congestive) heart failure: Secondary | ICD-10-CM | POA: Diagnosis not present

## 2017-06-27 DIAGNOSIS — I714 Abdominal aortic aneurysm, without rupture: Secondary | ICD-10-CM | POA: Insufficient documentation

## 2017-06-27 DIAGNOSIS — I509 Heart failure, unspecified: Secondary | ICD-10-CM | POA: Diagnosis present

## 2017-06-27 DIAGNOSIS — I110 Hypertensive heart disease with heart failure: Secondary | ICD-10-CM | POA: Diagnosis not present

## 2017-06-27 DIAGNOSIS — I10 Essential (primary) hypertension: Secondary | ICD-10-CM

## 2017-06-27 NOTE — Patient Instructions (Signed)
Continue weighing daily and call for an overnight weight gain of > 2 pounds or a weekly weight gain of >5 pounds. 

## 2017-06-28 ENCOUNTER — Encounter: Payer: Self-pay | Admitting: Family

## 2017-07-12 DIAGNOSIS — C44329 Squamous cell carcinoma of skin of other parts of face: Secondary | ICD-10-CM | POA: Diagnosis not present

## 2017-07-18 DIAGNOSIS — I495 Sick sinus syndrome: Secondary | ICD-10-CM | POA: Diagnosis not present

## 2017-07-18 DIAGNOSIS — I5022 Chronic systolic (congestive) heart failure: Secondary | ICD-10-CM | POA: Diagnosis not present

## 2017-07-18 DIAGNOSIS — I4891 Unspecified atrial fibrillation: Secondary | ICD-10-CM | POA: Diagnosis not present

## 2017-07-18 DIAGNOSIS — I714 Abdominal aortic aneurysm, without rupture: Secondary | ICD-10-CM | POA: Diagnosis not present

## 2017-07-18 DIAGNOSIS — I480 Paroxysmal atrial fibrillation: Secondary | ICD-10-CM | POA: Diagnosis not present

## 2017-07-18 DIAGNOSIS — I482 Chronic atrial fibrillation: Secondary | ICD-10-CM | POA: Diagnosis not present

## 2017-07-18 DIAGNOSIS — I10 Essential (primary) hypertension: Secondary | ICD-10-CM | POA: Diagnosis not present

## 2017-07-18 DIAGNOSIS — I350 Nonrheumatic aortic (valve) stenosis: Secondary | ICD-10-CM | POA: Diagnosis not present

## 2017-07-18 DIAGNOSIS — I2119 ST elevation (STEMI) myocardial infarction involving other coronary artery of inferior wall: Secondary | ICD-10-CM | POA: Diagnosis not present

## 2017-07-24 DIAGNOSIS — Z951 Presence of aortocoronary bypass graft: Secondary | ICD-10-CM | POA: Diagnosis not present

## 2017-07-24 DIAGNOSIS — I5022 Chronic systolic (congestive) heart failure: Secondary | ICD-10-CM | POA: Diagnosis not present

## 2017-07-24 DIAGNOSIS — Z953 Presence of xenogenic heart valve: Secondary | ICD-10-CM | POA: Diagnosis not present

## 2017-08-07 ENCOUNTER — Ambulatory Visit: Payer: Medicare HMO | Admitting: Family

## 2017-08-10 DIAGNOSIS — C44329 Squamous cell carcinoma of skin of other parts of face: Secondary | ICD-10-CM | POA: Diagnosis not present

## 2017-08-15 DIAGNOSIS — I480 Paroxysmal atrial fibrillation: Secondary | ICD-10-CM | POA: Diagnosis not present

## 2017-08-31 ENCOUNTER — Ambulatory Visit: Payer: Medicare HMO | Attending: Family | Admitting: Family

## 2017-08-31 ENCOUNTER — Encounter: Payer: Self-pay | Admitting: Family

## 2017-08-31 VITALS — BP 119/68 | HR 83 | Resp 18 | Ht 70.0 in | Wt 167.5 lb

## 2017-08-31 DIAGNOSIS — Z79890 Hormone replacement therapy (postmenopausal): Secondary | ICD-10-CM | POA: Diagnosis not present

## 2017-08-31 DIAGNOSIS — E039 Hypothyroidism, unspecified: Secondary | ICD-10-CM | POA: Insufficient documentation

## 2017-08-31 DIAGNOSIS — Z7984 Long term (current) use of oral hypoglycemic drugs: Secondary | ICD-10-CM | POA: Diagnosis not present

## 2017-08-31 DIAGNOSIS — E785 Hyperlipidemia, unspecified: Secondary | ICD-10-CM | POA: Insufficient documentation

## 2017-08-31 DIAGNOSIS — Z7982 Long term (current) use of aspirin: Secondary | ICD-10-CM | POA: Diagnosis not present

## 2017-08-31 DIAGNOSIS — Z953 Presence of xenogenic heart valve: Secondary | ICD-10-CM | POA: Diagnosis not present

## 2017-08-31 DIAGNOSIS — E119 Type 2 diabetes mellitus without complications: Secondary | ICD-10-CM | POA: Diagnosis not present

## 2017-08-31 DIAGNOSIS — K219 Gastro-esophageal reflux disease without esophagitis: Secondary | ICD-10-CM | POA: Insufficient documentation

## 2017-08-31 DIAGNOSIS — I2510 Atherosclerotic heart disease of native coronary artery without angina pectoris: Secondary | ICD-10-CM | POA: Diagnosis not present

## 2017-08-31 DIAGNOSIS — I5022 Chronic systolic (congestive) heart failure: Secondary | ICD-10-CM

## 2017-08-31 DIAGNOSIS — I714 Abdominal aortic aneurysm, without rupture: Secondary | ICD-10-CM | POA: Insufficient documentation

## 2017-08-31 DIAGNOSIS — I110 Hypertensive heart disease with heart failure: Secondary | ICD-10-CM | POA: Insufficient documentation

## 2017-08-31 DIAGNOSIS — Z79899 Other long term (current) drug therapy: Secondary | ICD-10-CM | POA: Insufficient documentation

## 2017-08-31 DIAGNOSIS — R5383 Other fatigue: Secondary | ICD-10-CM | POA: Diagnosis present

## 2017-08-31 DIAGNOSIS — Z881 Allergy status to other antibiotic agents status: Secondary | ICD-10-CM | POA: Diagnosis not present

## 2017-08-31 DIAGNOSIS — Z888 Allergy status to other drugs, medicaments and biological substances status: Secondary | ICD-10-CM | POA: Insufficient documentation

## 2017-08-31 DIAGNOSIS — I4891 Unspecified atrial fibrillation: Secondary | ICD-10-CM | POA: Insufficient documentation

## 2017-08-31 DIAGNOSIS — Z882 Allergy status to sulfonamides status: Secondary | ICD-10-CM | POA: Diagnosis not present

## 2017-08-31 DIAGNOSIS — N400 Enlarged prostate without lower urinary tract symptoms: Secondary | ICD-10-CM | POA: Insufficient documentation

## 2017-08-31 DIAGNOSIS — Z7901 Long term (current) use of anticoagulants: Secondary | ICD-10-CM | POA: Diagnosis not present

## 2017-08-31 DIAGNOSIS — I10 Essential (primary) hypertension: Secondary | ICD-10-CM

## 2017-08-31 DIAGNOSIS — Z950 Presence of cardiac pacemaker: Secondary | ICD-10-CM | POA: Insufficient documentation

## 2017-08-31 DIAGNOSIS — I252 Old myocardial infarction: Secondary | ICD-10-CM | POA: Insufficient documentation

## 2017-08-31 DIAGNOSIS — Z87891 Personal history of nicotine dependence: Secondary | ICD-10-CM | POA: Diagnosis not present

## 2017-08-31 DIAGNOSIS — Z951 Presence of aortocoronary bypass graft: Secondary | ICD-10-CM | POA: Diagnosis not present

## 2017-08-31 NOTE — Progress Notes (Signed)
Patient ID: Derrick Cochran, male    DOB: [birth year], 82 y.o.   MRN: [id number]  HPI  Derrick Cochran is a 82 y/o male with a history of MI, hypothyroidism, HTN, hyperlipidemia, GERD, DM, BPH, atrial fibrillation, AAA, remote tobacco use and chronic heart failure.   Echo report from 12/06/16 reviewed and showed an EF of 20% along with mild AR and moderate Derrick/TR. Echo done 11/29/15 which showed an EF of 30-35% with bioprosthesis valve present. Also has mild Derrick. This is an improvement from his EF of 20-25% on 04/11/15. Last catheterization was done 12/04/13. CABG done 1995.  He has not been admitted or been in the ED in the last 6 months.   He presents today for a follow-up visit with a chief complaint of moderate fatigue upon minimal exertion. He says that this has been present for several years but seems to be worsening over the last several months where he doesn't feel like he can do "anything". He has associated shortness of breath, light-headedness, easy bruising and difficulty sleeping along with this. He denies any weight gain, abdominal distention, palpitations, pedal edema, chest pain or cough.   Past Medical History:  Diagnosis Date  . AAA (abdominal aortic aneurysm) ([place])   . Arrhythmia    atrial fibrillation  . Arthritis   . BPH (benign prostatic hyperplasia)   . Cancer ([place])    skin  . CHF (congestive heart failure) ([place])   . Coronary artery disease   . DDD (degenerative disc disease), thoracic   . Diabetes mellitus without complication ([place])   . Esophageal stricture   . GERD (gastroesophageal reflux disease)   . Heart murmur   . Hyperlipidemia   . Hypertension   . Hypothyroidism   . Myocardial infarction ([place])   . Shortness of breath dyspnea     Past Surgical History:  Procedure Laterality Date  . AORTIC VALVE REPLACEMENT  1995   Bioprosthetic - [facility]  . APPENDECTOMY    . CARDIAC CATHETERIZATION  1994 x 2 with PCTA of RCA  . CARDIAC VALVE REPLACEMENT     Bovine  transcatheter heart valve  . COLONOSCOPY  2015  . CORONARY ARTERY BYPASS GRAFT  1995   x 4 Vessels - [facility]  . ELECTROPHYSIOLOGIC STUDY N/A 08/06/2014   Procedure: Cardioversion;  Surgeon: [name], MD;  Location: [facility];  Service: Cardiovascular;  Laterality: N/A;  . ELECTROPHYSIOLOGIC STUDY N/A 06/18/2014   Procedure: CARDIOVERSION;  Surgeon: [name], MD;  Location: [facility];  Service: Cardiovascular;  Laterality: N/A;  . ESOPHAGOGASTRODUODENOSCOPY  2014  . GREAT TOE ARTHRODESIS, INTERPHALANGEAL JOINT Left   . HERNIA REPAIR Left    Inguinal  . PACEMAKER INSERTION N/A 07/02/2014   Procedure: INSERTION PACEMAKER/DUAL CHAMBER  INITIAL IMPLANT;  Surgeon: [name], MD;  Location: [facility];  Service: Cardiovascular;  Laterality: N/A;  . SKIN BIOPSY      Family History  Problem Relation Age of Onset  . Cancer Mother   . Cancer Father   . Kidney disease Sister   . Heart failure Brother     Social History   Tobacco Use  . Smoking status: Former Smoker    Packs/day: 0.50    Years: 15.00    Pack years: 7.50    Types: Cigarettes    Last attempt to quit: 04/22/1985    Years since quitting: 32.3  . Smokeless tobacco: Former Systems developer    Types: Chew  Substance Use Topics  . Alcohol use:  No    Allergies  Allergen Reactions  . Cefdinir Other (See Comments)    nightmares  . Adhesive [Tape] Rash  . Sulfa Antibiotics Rash  . Sulfamethoxazole Hives, Itching and Rash   Prior to Admission medications   Medication Sig Start Date End Date Taking? Authorizing Provider  acetaminophen (TYLENOL) 500 MG tablet Take 500 mg by mouth every 6 (six) hours as needed for mild pain, moderate pain or fever.    Yes [provider]  aspirin EC 81 MG EC tablet Take 1 tablet (81 mg total) by mouth daily. 04/13/15  Yes Dustin Flock, MD  celecoxib (CELEBREX) 200 MG capsule Take 200 mg by mouth daily.   Yes [provider]  cyanocobalamin 1000 MCG  tablet Take 1,000 mcg by mouth as needed.    Yes [provider]  dapagliflozin propanediol (FARXIGA) 10 MG TABS tablet Take 10 mg by mouth daily. Study drug   Yes [provider]  enalapril (VASOTEC) 5 MG tablet Take 1 tablet (5 mg total) by mouth daily. Patient taking differently: Take 5 mg by mouth 2 (two) times daily.  11/30/15  Yes Mody, Ulice Bold, MD  furosemide (LASIX) 20 MG tablet Take 20 mg by mouth every morning.    Yes [provider]  gabapentin (NEURONTIN) 300 MG capsule Take 300 mg by mouth daily.    Yes [provider]  glipiZIDE (GLUCOTROL XL) 5 MG 24 hr tablet Take 5 mg by mouth every morning. 01/21/15  Yes [provider]  levothyroxine (SYNTHROID, LEVOTHROID) 112 MCG tablet Take 112 mcg by mouth daily before breakfast.   Yes [provider]  loratadine (CLARITIN) 10 MG tablet Take 1 tablet by mouth daily. 05/21/15 08/31/17 Yes [provider]  metFORMIN (GLUCOPHAGE) 500 MG tablet Take 500 mg by mouth 2 (two) times daily.    Yes [provider]  metoprolol succinate (TOPROL-XL) 25 MG 24 hr tablet Take 25 mg by mouth daily.   Yes [provider]  nitroGLYCERIN (NITROSTAT) 0.4 MG SL tablet Place 0.4 mg under the tongue every 5 (five) minutes as needed for chest pain.   Yes [provider]  omeprazole (PRILOSEC) 20 MG capsule Take 20 mg by mouth every morning.    Yes [provider]  simvastatin (ZOCOR) 40 MG tablet Take 40 mg by mouth every evening.    Yes [provider]  sotalol (BETAPACE) 120 MG tablet Take 1 tablet (120 mg total) by mouth 2 (two) times daily. 11/30/15  Yes Mody, Ulice Bold, MD  tamsulosin (FLOMAX) 0.4 MG CAPS capsule Take 0.4 mg by mouth every morning.    Yes [provider]  temazepam (RESTORIL) 15 MG capsule Take 15 mg by mouth at bedtime.    Yes [provider]  warfarin (COUMADIN) 1 MG tablet Take 1 mg by mouth every evening. Pt takes with 3mg   tablet for a total dose of 4mg .   Yes [provider]  warfarin (COUMADIN) 3 MG tablet Take 3 mg by mouth every evening. Pt takes with 1mg  tablet for a total dose of 4mg .   Yes [provider]   Review of Systems  Constitutional: Positive for fatigue. Negative for appetite change.  HENT: Positive for rhinorrhea. Negative for congestion, postnasal drip and sore throat.   Eyes: Negative.   Respiratory: Positive for shortness of breath (easily). Negative for cough and chest tightness.   Cardiovascular: Negative for chest pain, palpitations and leg swelling.  Gastrointestinal: Negative for abdominal distention and  abdominal pain.  Endocrine: Negative.   Genitourinary: Negative.   Musculoskeletal: Positive for arthralgias (right hip pain). Negative for back pain.  Skin: Negative.   Allergic/Immunologic: Negative.   Neurological: Positive for light-headedness (at times). Negative for dizziness.  Hematological: Negative for adenopathy. Bruises/bleeds easily.  Psychiatric/Behavioral: Positive for sleep disturbance (sleeping on 1 pillow; waking up around 3-4am every morning). Negative for dysphoric mood. The patient is not nervous/anxious.    Vitals:   08/31/17 1118  BP: 119/68  Pulse: 83  Resp: 18  SpO2: 98%  Weight: 167 lb 8 oz (76 kg)  Height: 5\' 10"  (1.778 m)   Wt Readings from Last 3 Encounters:  08/31/17 167 lb 8 oz (76 kg)  06/27/17 167 lb (75.8 kg)  02/16/17 170 lb 12.8 oz (77.5 kg)   Lab Results  Component Value Date   CREATININE 1.24 11/29/2015   CREATININE 1.46 (H) 11/28/2015   CREATININE 1.62 (H) 04/13/2015    Physical Exam  Constitutional: He is oriented to person, place, and time. He appears well-developed and well-nourished.  HENT:  Head: Normocephalic and atraumatic.  Neck: Normal range of motion. Neck supple.  Cardiovascular: Normal rate and regular rhythm.  Pulmonary/Chest: Effort normal. He has no wheezes. He has no rales.  Abdominal: Soft.  He exhibits no distension. There is no tenderness.  Musculoskeletal: He exhibits no edema or tenderness.  Neurological: He is alert and oriented to person, place, and time.  Skin: Skin is warm and dry.  Psychiatric: He has a normal mood and affect. His behavior is normal. Thought content normal.  Nursing note and vitals reviewed.  Assessment & Plan:  1: Chronic heart failure with reduced ejection fraction- - NYHA class III - euvolemic - weighing daily at home; Reminded to call for an overnight weight gain of >2 pounds or a weekly weight gain of >5 pounds - weight stable from last time he was here 2 months ago - not adding salt to his food and trying to eat low sodium foods - continue discussing entresto use with patient - due to continued fatigue, will decrease his metoprolol succinate in half and he was instructed to cut the tablet in half and take 1/2 tablet daily - encouraged him to ask his PCP about checking his thyroid and/ or CBC to rule out other causes of fatigue - saw his cardiologist ([place]) on 07/18/17  2: HTN- - BP looks good today - saw PCP ([place]) on 05/30/17 - BMP on 05/30/17 reviewed and showed sodium 142, potassium 4.3 and GFR 64  3: Diabetes- - yesterday patient says his glucose was 155 nonfasting - A1c on 05/30/17 was 5.7%  Patient did not bring his medications nor a list. Each medication was verbally reviewed with the patient and he was encouraged to bring the bottles to every visit to confirm accuracy of list.  Return in 1 month or sooner for any questions/problems before then.

## 2017-08-31 NOTE — Patient Instructions (Addendum)
Continue weighing daily and call for an overnight weight gain of > 2 pounds or a weekly weight gain of >5 pounds.  Decrease metoprolol to 1/2 tablet daily.  Ask primary care provider to check thyroid and CBC levels due to fatigue.

## 2017-09-12 DIAGNOSIS — I480 Paroxysmal atrial fibrillation: Secondary | ICD-10-CM | POA: Diagnosis not present

## 2017-09-20 DIAGNOSIS — I480 Paroxysmal atrial fibrillation: Secondary | ICD-10-CM | POA: Diagnosis not present

## 2017-09-23 ENCOUNTER — Other Ambulatory Visit: Payer: Self-pay

## 2017-09-23 ENCOUNTER — Emergency Department: Payer: Medicare HMO

## 2017-09-23 ENCOUNTER — Encounter: Payer: Self-pay | Admitting: Emergency Medicine

## 2017-09-23 ENCOUNTER — Inpatient Hospital Stay
Admission: EM | Admit: 2017-09-23 | Discharge: 2017-09-25 | DRG: 291 | Disposition: A | Discharge status: Home / Self Care | Payer: Medicare HMO | Attending: Internal Medicine | Admitting: Internal Medicine

## 2017-09-23 DIAGNOSIS — I509 Heart failure, unspecified: Secondary | ICD-10-CM | POA: Diagnosis not present

## 2017-09-23 DIAGNOSIS — Z882 Allergy status to sulfonamides status: Secondary | ICD-10-CM

## 2017-09-23 DIAGNOSIS — Z950 Presence of cardiac pacemaker: Secondary | ICD-10-CM

## 2017-09-23 DIAGNOSIS — Z953 Presence of xenogenic heart valve: Secondary | ICD-10-CM

## 2017-09-23 DIAGNOSIS — I5022 Chronic systolic (congestive) heart failure: Secondary | ICD-10-CM

## 2017-09-23 DIAGNOSIS — I2510 Atherosclerotic heart disease of native coronary artery without angina pectoris: Secondary | ICD-10-CM | POA: Diagnosis present

## 2017-09-23 DIAGNOSIS — J9601 Acute respiratory failure with hypoxia: Secondary | ICD-10-CM | POA: Diagnosis present

## 2017-09-23 DIAGNOSIS — I252 Old myocardial infarction: Secondary | ICD-10-CM

## 2017-09-23 DIAGNOSIS — Z66 Do not resuscitate: Secondary | ICD-10-CM | POA: Diagnosis present

## 2017-09-23 DIAGNOSIS — I110 Hypertensive heart disease with heart failure: Principal | ICD-10-CM | POA: Diagnosis present

## 2017-09-23 DIAGNOSIS — Z79890 Hormone replacement therapy (postmenopausal): Secondary | ICD-10-CM

## 2017-09-23 DIAGNOSIS — Z841 Family history of disorders of kidney and ureter: Secondary | ICD-10-CM

## 2017-09-23 DIAGNOSIS — Z23 Encounter for immunization: Secondary | ICD-10-CM | POA: Diagnosis not present

## 2017-09-23 DIAGNOSIS — I447 Left bundle-branch block, unspecified: Secondary | ICD-10-CM | POA: Diagnosis present

## 2017-09-23 DIAGNOSIS — I719 Aortic aneurysm of unspecified site, without rupture: Secondary | ICD-10-CM | POA: Diagnosis present

## 2017-09-23 DIAGNOSIS — N400 Enlarged prostate without lower urinary tract symptoms: Secondary | ICD-10-CM | POA: Diagnosis present

## 2017-09-23 DIAGNOSIS — Z951 Presence of aortocoronary bypass graft: Secondary | ICD-10-CM

## 2017-09-23 DIAGNOSIS — Z7901 Long term (current) use of anticoagulants: Secondary | ICD-10-CM

## 2017-09-23 DIAGNOSIS — I4891 Unspecified atrial fibrillation: Secondary | ICD-10-CM | POA: Diagnosis not present

## 2017-09-23 DIAGNOSIS — R42 Dizziness and giddiness: Secondary | ICD-10-CM | POA: Diagnosis not present

## 2017-09-23 DIAGNOSIS — I482 Chronic atrial fibrillation: Secondary | ICD-10-CM | POA: Diagnosis present

## 2017-09-23 DIAGNOSIS — I5023 Acute on chronic systolic (congestive) heart failure: Secondary | ICD-10-CM | POA: Diagnosis not present

## 2017-09-23 DIAGNOSIS — E782 Mixed hyperlipidemia: Secondary | ICD-10-CM | POA: Diagnosis present

## 2017-09-23 DIAGNOSIS — Z87891 Personal history of nicotine dependence: Secondary | ICD-10-CM

## 2017-09-23 DIAGNOSIS — Z8249 Family history of ischemic heart disease and other diseases of the circulatory system: Secondary | ICD-10-CM

## 2017-09-23 DIAGNOSIS — E1140 Type 2 diabetes mellitus with diabetic neuropathy, unspecified: Secondary | ICD-10-CM | POA: Diagnosis not present

## 2017-09-23 DIAGNOSIS — R0602 Shortness of breath: Secondary | ICD-10-CM | POA: Diagnosis not present

## 2017-09-23 DIAGNOSIS — E039 Hypothyroidism, unspecified: Secondary | ICD-10-CM | POA: Diagnosis not present

## 2017-09-23 DIAGNOSIS — Z7982 Long term (current) use of aspirin: Secondary | ICD-10-CM

## 2017-09-23 DIAGNOSIS — Z801 Family history of malignant neoplasm of trachea, bronchus and lung: Secondary | ICD-10-CM

## 2017-09-23 DIAGNOSIS — K219 Gastro-esophageal reflux disease without esophagitis: Secondary | ICD-10-CM | POA: Diagnosis present

## 2017-09-23 DIAGNOSIS — I350 Nonrheumatic aortic (valve) stenosis: Secondary | ICD-10-CM | POA: Diagnosis not present

## 2017-09-23 DIAGNOSIS — E1151 Type 2 diabetes mellitus with diabetic peripheral angiopathy without gangrene: Secondary | ICD-10-CM | POA: Diagnosis present

## 2017-09-23 DIAGNOSIS — I429 Cardiomyopathy, unspecified: Secondary | ICD-10-CM | POA: Diagnosis present

## 2017-09-23 DIAGNOSIS — E119 Type 2 diabetes mellitus without complications: Secondary | ICD-10-CM | POA: Diagnosis not present

## 2017-09-23 DIAGNOSIS — Z7984 Long term (current) use of oral hypoglycemic drugs: Secondary | ICD-10-CM

## 2017-09-23 LAB — BASIC METABOLIC PANEL
ANION GAP: 11 (ref 5–15)
BUN: 18 mg/dL (ref 8–23)
CO2: 26 mmol/L (ref 22–32)
Calcium: 8.6 mg/dL — ABNORMAL LOW (ref 8.9–10.3)
Chloride: 104 mmol/L (ref 98–111)
Creatinine, Ser: 0.98 mg/dL (ref 0.61–1.24)
GFR calc Af Amer: >60 mL/min (ref >60)
GFR calc non Af Amer: >60 mL/min (ref >60)
GLUCOSE: 121 mg/dL — AB (ref 70–99)
POTASSIUM: 4.5 mmol/L (ref 3.5–5.1)
Sodium: 141 mmol/L (ref 135–145)

## 2017-09-23 LAB — TROPONIN I
Troponin I: <0.03 ng/mL (ref <0.03)
Troponin I: <0.03 ng/mL (ref <0.03)

## 2017-09-23 LAB — CBC
HEMATOCRIT: 41.5 % (ref 40.0–52.0)
Hemoglobin: 14.2 g/dL (ref 13.0–18.0)
MCH: 34.1 pg — AB (ref 26.0–34.0)
MCHC: 34.3 g/dL (ref 32.0–36.0)
MCV: 99.5 fL (ref 80.0–100.0)
Platelets: 106 10*3/uL — ABNORMAL LOW (ref 150–440)
RBC: 4.17 MIL/uL — AB (ref 4.40–5.90)
RDW: 15.1 % — AB (ref 11.5–14.5)
WBC: 7 10*3/uL (ref 3.8–10.6)

## 2017-09-23 LAB — PROTIME-INR
INR: 1.7
Prothrombin Time: 19.8 seconds — ABNORMAL HIGH (ref 11.4–15.2)

## 2017-09-23 LAB — GLUCOSE, CAPILLARY: GLUCOSE-CAPILLARY: 120 mg/dL — AB (ref 70–99)

## 2017-09-23 LAB — BRAIN NATRIURETIC PEPTIDE: B NATRIURETIC PEPTIDE 5: 4061 pg/mL — AB (ref 0.0–100.0)

## 2017-09-23 MED ORDER — ONDANSETRON HCL 4 MG PO TABS: 4.0000 mg | ORAL_TABLET | Freq: Four times a day (QID) | ORAL | Status: DC | PRN

## 2017-09-23 MED ORDER — PNEUMOCOCCAL VAC POLYVALENT 25 MCG/0.5ML IJ INJ
0.5000 mL | INJECTION | INTRAMUSCULAR | Status: AC
  Administered 2017-09-24: 0.5 mL via INTRAMUSCULAR
  Filled 2017-09-23: qty 0.5

## 2017-09-23 MED ORDER — METFORMIN HCL 500 MG PO TABS
500.0000 mg | ORAL_TABLET | Freq: Two times a day (BID) | ORAL | Status: DC
  Administered 2017-09-25: 500 mg via ORAL
  Filled 2017-09-23: qty 1

## 2017-09-23 MED ORDER — VITAMIN B-12 1000 MCG PO TABS
1000.0000 ug | ORAL_TABLET | Freq: Every day | ORAL | Status: DC
  Administered 2017-09-24 – 2017-09-25 (×2): 1000 ug via ORAL
  Filled 2017-09-23 (×2): qty 1

## 2017-09-23 MED ORDER — SODIUM CHLORIDE 0.9% FLUSH
3.0000 mL | Freq: Two times a day (BID) | INTRAVENOUS | Status: DC
  Administered 2017-09-23 – 2017-09-25 (×4): 3 mL via INTRAVENOUS

## 2017-09-23 MED ORDER — PANTOPRAZOLE SODIUM 40 MG PO TBEC
40.0000 mg | DELAYED_RELEASE_TABLET | Freq: Every day | ORAL | Status: DC
  Administered 2017-09-24 – 2017-09-25 (×2): 40 mg via ORAL
  Filled 2017-09-23 (×2): qty 1

## 2017-09-23 MED ORDER — MECLIZINE HCL 25 MG PO TABS
12.5000 mg | ORAL_TABLET | Freq: Three times a day (TID) | ORAL | Status: DC | PRN
  Filled 2017-09-23: qty 0.5

## 2017-09-23 MED ORDER — WARFARIN - PHARMACIST DOSING INPATIENT
Freq: Every day | Status: DC
  Filled 2017-09-23 (×3): qty 1

## 2017-09-23 MED ORDER — FUROSEMIDE 10 MG/ML IJ SOLN
20.0000 mg | Freq: Two times a day (BID) | INTRAMUSCULAR | Status: DC
  Administered 2017-09-24 – 2017-09-25 (×3): 20 mg via INTRAVENOUS
  Filled 2017-09-23 (×3): qty 2

## 2017-09-23 MED ORDER — METOPROLOL SUCCINATE ER 25 MG PO TB24
25.0000 mg | ORAL_TABLET | Freq: Every day | ORAL | Status: DC
  Administered 2017-09-24: 25 mg via ORAL
  Filled 2017-09-23 (×2): qty 1

## 2017-09-23 MED ORDER — GABAPENTIN 300 MG PO CAPS
300.0000 mg | ORAL_CAPSULE | Freq: Every day | ORAL | Status: DC
  Administered 2017-09-23 – 2017-09-24 (×2): 300 mg via ORAL
  Filled 2017-09-23 (×2): qty 1

## 2017-09-23 MED ORDER — FUROSEMIDE 10 MG/ML IJ SOLN
20.0000 mg | Freq: Once | INTRAMUSCULAR | Status: AC
  Administered 2017-09-23: 20 mg via INTRAVENOUS
  Filled 2017-09-23: qty 4

## 2017-09-23 MED ORDER — WARFARIN SODIUM 6 MG PO TABS
6.0000 mg | ORAL_TABLET | Freq: Once | ORAL | Status: AC
  Administered 2017-09-23: 6 mg via ORAL
  Filled 2017-09-23: qty 1

## 2017-09-23 MED ORDER — INSULIN ASPART 100 UNIT/ML ~~LOC~~ SOLN
0.0000 [IU] | Freq: Three times a day (TID) | SUBCUTANEOUS | Status: DC
  Administered 2017-09-25: 2 [IU] via SUBCUTANEOUS
  Filled 2017-09-23: qty 1

## 2017-09-23 MED ORDER — ONDANSETRON HCL 4 MG/2ML IJ SOLN: 4.0000 mg | Freq: Four times a day (QID) | INTRAMUSCULAR | Status: DC | PRN

## 2017-09-23 MED ORDER — SIMVASTATIN 20 MG PO TABS
40.0000 mg | ORAL_TABLET | Freq: Every evening | ORAL | Status: DC
  Administered 2017-09-23 – 2017-09-24 (×2): 40 mg via ORAL
  Filled 2017-09-23 (×2): qty 2

## 2017-09-23 MED ORDER — TAMSULOSIN HCL 0.4 MG PO CAPS
0.4000 mg | ORAL_CAPSULE | Freq: Every morning | ORAL | Status: DC
  Administered 2017-09-24 – 2017-09-25 (×2): 0.4 mg via ORAL
  Filled 2017-09-23 (×2): qty 1

## 2017-09-23 MED ORDER — TEMAZEPAM 15 MG PO CAPS
30.0000 mg | ORAL_CAPSULE | Freq: Every day | ORAL | Status: DC
  Administered 2017-09-23 – 2017-09-24 (×2): 30 mg via ORAL
  Filled 2017-09-23 (×2): qty 2

## 2017-09-23 MED ORDER — ACETAMINOPHEN 325 MG PO TABS: 650.0000 mg | ORAL_TABLET | Freq: Four times a day (QID) | ORAL | Status: DC | PRN

## 2017-09-23 MED ORDER — GLIPIZIDE ER 5 MG PO TB24
5.0000 mg | ORAL_TABLET | ORAL | Status: DC
  Administered 2017-09-25: 5 mg via ORAL
  Filled 2017-09-23 (×2): qty 1

## 2017-09-23 MED ORDER — INSULIN ASPART 100 UNIT/ML ~~LOC~~ SOLN: 0.0000 [IU] | Freq: Every day | SUBCUTANEOUS | Status: DC

## 2017-09-23 MED ORDER — ACETAMINOPHEN 650 MG RE SUPP: 650.0000 mg | Freq: Four times a day (QID) | RECTAL | Status: DC | PRN

## 2017-09-23 MED ORDER — ASPIRIN EC 81 MG PO TBEC
81.0000 mg | DELAYED_RELEASE_TABLET | Freq: Every day | ORAL | Status: DC
  Administered 2017-09-24 – 2017-09-25 (×2): 81 mg via ORAL
  Filled 2017-09-23 (×2): qty 1

## 2017-09-23 MED ORDER — SOTALOL HCL 80 MG PO TABS
120.0000 mg | ORAL_TABLET | Freq: Two times a day (BID) | ORAL | Status: DC
  Administered 2017-09-24 – 2017-09-25 (×3): 120 mg via ORAL
  Filled 2017-09-23 (×4): qty 1.5

## 2017-09-23 MED ORDER — LEVOTHYROXINE SODIUM 112 MCG PO TABS
112.0000 ug | ORAL_TABLET | Freq: Every day | ORAL | Status: DC
Start: 2017-09-24 — End: 2017-09-25
  Administered 2017-09-24 – 2017-09-25 (×2): 112 ug via ORAL
  Filled 2017-09-23 (×2): qty 1

## 2017-09-23 MED ORDER — ENALAPRIL MALEATE 5 MG PO TABS
5.0000 mg | ORAL_TABLET | Freq: Two times a day (BID) | ORAL | Status: DC
  Administered 2017-09-23 – 2017-09-24 (×3): 5 mg via ORAL
  Filled 2017-09-23 (×5): qty 1

## 2017-09-23 NOTE — ED Notes (Signed)
RN walked into pt room to obtain 2nd Trop pt was sitting at the foot of the bed and was grasping for air. Pt O2 at that time was 89. RN and Waunita Schooner Medic moved pt back in bed at this time pt is at 97% RA.

## 2017-09-23 NOTE — H&P (Addendum)
[facility] NAME: Derrick Cochran    MR#:  [id number]  DATE OF BIRTH:  17-Dec-1935  DATE OF ADMISSION:  09/23/2017  PRIMARY CARE PHYSICIAN: [name], MD   REQUESTING/REFERRING PHYSICIAN: Dr. Rudene Re  CHIEF COMPLAINT:   Chief Complaint  Patient presents with  . Shortness of Breath    HISTORY OF PRESENT ILLNESS:  Derrick Cochran  is a 82 y.o. male with a known history of chronic atrial fibrillation, chronic systolic CHF, history of coronary artery disease, diabetes, hypertension, hyperlipidemia, GERD, hypothyroidism, BPH who presents to the hospital due to shortness of breath.  Patient says that shortness of breath has progressively gotten worse over this past week.  He has more shortness of breath on exertion and at rest.  He denies any chest pains, nausea, vomiting, palpitations, syncope.  He denies any significant weight gain or any orthopnea.  He does admit to some paroxysmal nocturnal dyspnea as he is been sleeping on his recliner with past couple days.  He also complains of some vertigo, and is supposed to see ENT later next week.  Patient presents to the hospital and was noted to be in acute on chronic congestive heart failure with chest x-ray findings suggestive of pulmonary vascular congestion, and his BNP being significantly elevated.  Hospitalist services were contacted for admission.  PAST MEDICAL HISTORY:   Past Medical History:  Diagnosis Date  . AAA (abdominal aortic aneurysm) ([place])   . Arrhythmia    atrial fibrillation  . Arthritis   . BPH (benign prostatic hyperplasia)   . Cancer ([place])    skin  . CHF (congestive heart failure) ([place])   . Coronary artery disease   . DDD (degenerative disc disease), thoracic   . Diabetes mellitus without complication ([place])   . Esophageal stricture   . GERD (gastroesophageal reflux disease)   . Heart murmur   . Hyperlipidemia   . Hypertension   . Hypothyroidism   .  Myocardial infarction ([place])   . Shortness of breath dyspnea     PAST SURGICAL HISTORY:   Past Surgical History:  Procedure Laterality Date  . AORTIC VALVE REPLACEMENT  1995   Bioprosthetic - [place]  . APPENDECTOMY    . CARDIAC CATHETERIZATION  1994 x 2 with PCTA of RCA  . CARDIAC VALVE REPLACEMENT     Bovine transcatheter heart valve  . COLONOSCOPY  2015  . CORONARY ARTERY BYPASS GRAFT  1995   x 4 Vessels - [facility]  . ELECTROPHYSIOLOGIC STUDY N/A 08/06/2014   Procedure: Cardioversion;  Surgeon: [name], MD;  Location: [facility];  Service: Cardiovascular;  Laterality: N/A;  . ELECTROPHYSIOLOGIC STUDY N/A 06/18/2014   Procedure: CARDIOVERSION;  Surgeon: [name], MD;  Location: [facility];  Service: Cardiovascular;  Laterality: N/A;  . ESOPHAGOGASTRODUODENOSCOPY  2014  . GREAT TOE ARTHRODESIS, INTERPHALANGEAL JOINT Left   . HERNIA REPAIR Left    Inguinal  . PACEMAKER INSERTION N/A 07/02/2014   Procedure: INSERTION PACEMAKER/DUAL CHAMBER  INITIAL IMPLANT;  Surgeon: [name], MD;  Location: [facility];  Service: Cardiovascular;  Laterality: N/A;  . SKIN BIOPSY      SOCIAL HISTORY:   Social History   Tobacco Use  . Smoking status: Former Smoker    Packs/day: 0.50    Years: 15.00    Pack years: 7.50    Types: Cigarettes    Last attempt to quit: 04/22/1985    Years since quitting: 32.4  .  Smokeless tobacco: Former Systems developer    Types: Chew  Substance Use Topics  . Alcohol use: No    FAMILY HISTORY:   Family History  Problem Relation Age of Onset  . Cancer Mother   . Lung cancer Father   . Kidney disease Sister   . Heart failure Brother     DRUG ALLERGIES:   Allergies  Allergen Reactions  . Cefdinir Other (See Comments)    nightmares  . Adhesive [Tape] Rash  . Sulfa Antibiotics Rash  . Sulfamethoxazole Hives, Itching and Rash    REVIEW OF SYSTEMS:   Review of Systems  Constitutional: Negative for fever and weight loss.  HENT:  Negative for congestion, nosebleeds and tinnitus.   Eyes: Negative for blurred vision, double vision and redness.  Respiratory: Positive for shortness of breath. Negative for cough and hemoptysis.   Cardiovascular: Positive for PND. Negative for chest pain, orthopnea and leg swelling.  Gastrointestinal: Negative for abdominal pain, diarrhea, melena, nausea and vomiting.  Genitourinary: Negative for dysuria, hematuria and urgency.  Musculoskeletal: Negative for falls and joint pain.  Neurological: Negative for dizziness, tingling, sensory change, focal weakness, seizures, weakness and headaches.  Endo/Heme/Allergies: Negative for polydipsia. Does not bruise/bleed easily.  Psychiatric/Behavioral: Negative for depression and memory loss. The patient is not nervous/anxious.     MEDICATIONS AT HOME:   Prior to Admission medications   Medication Sig Start Date End Date Taking? Authorizing Provider  aspirin EC 81 MG EC tablet Take 1 tablet (81 mg total) by mouth daily. 04/13/15  Yes Dustin Flock, MD  cyanocobalamin 1000 MCG tablet Take 1,000 mcg by mouth as needed.    Yes [provider]  dapagliflozin propanediol (FARXIGA) 10 MG TABS tablet Take 10 mg by mouth daily. Study drug   Yes [provider]  enalapril (VASOTEC) 5 MG tablet Take 1 tablet (5 mg total) by mouth daily. Patient taking differently: Take 5 mg by mouth 2 (two) times daily.  11/30/15  Yes Mody, Ulice Bold, MD  furosemide (LASIX) 20 MG tablet Take 20 mg by mouth every morning.    Yes [provider]  gabapentin (NEURONTIN) 300 MG capsule Take 300 mg by mouth at bedtime.   Yes [provider]  glipiZIDE (GLUCOTROL XL) 5 MG 24 hr tablet Take 5 mg by mouth every morning. 01/21/15  Yes [provider]  levothyroxine (SYNTHROID, LEVOTHROID) 112 MCG tablet Take 112 mcg by mouth daily before breakfast.   Yes [provider]  metFORMIN (GLUCOPHAGE) 500 MG tablet Take 500 mg by mouth 2 (two)  times daily.    Yes [provider]  metoprolol succinate (TOPROL-XL) 25 MG 24 hr tablet Take 25 mg by mouth daily.   Yes [provider]  nitroGLYCERIN (NITROSTAT) 0.4 MG SL tablet Place 0.4 mg under the tongue every 5 (five) minutes as needed for chest pain.   Yes [provider]  omeprazole (PRILOSEC) 20 MG capsule Take 20 mg by mouth every morning.    Yes [provider]  simvastatin (ZOCOR) 40 MG tablet Take 40 mg by mouth every evening.    Yes [provider]  sotalol (BETAPACE) 120 MG tablet Take 1 tablet (120 mg total) by mouth 2 (two) times daily. 11/30/15  Yes Mody, Ulice Bold, MD  tamsulosin (FLOMAX) 0.4 MG CAPS capsule Take 0.4 mg by mouth every morning.    Yes [provider]  temazepam (RESTORIL) 15 MG capsule Take 30 mg by mouth at bedtime.    Yes [provider]  warfarin (COUMADIN) 1 MG tablet Take 1 mg by mouth every evening. Pt takes with 3mg  tablet for a total dose of 4mg .   Yes [provider]  warfarin (COUMADIN) 3 MG tablet Take 3 mg by mouth every evening. Pt takes with 1mg  tablet for a total dose of 4mg .   Yes [provider]  acetaminophen (TYLENOL) 500 MG tablet Take 500 mg by mouth every 6 (six) hours as needed for mild pain, moderate pain or fever.     [provider]  loratadine (CLARITIN) 10 MG tablet Take 1 tablet by mouth daily. 05/21/15 08/31/17  [provider]      VITAL SIGNS:  Blood pressure (!) 125/59, pulse 68, resp. rate 16, height 5\' 10"  (1.778 m), weight 74.8 kg, SpO2 94 %.  PHYSICAL EXAMINATION:  Physical Exam  GENERAL:  82 y.o.-year-old patient lying in the bed in no acute distress.  EYES: Pupils equal, round, reactive to light and accommodation. No scleral icterus. Extraocular muscles intact.  HEENT: Head atraumatic, normocephalic. Oropharynx and nasopharynx clear. No oropharyngeal erythema, moist oral mucosa  NECK:  Supple, no jugular venous distention. No  thyroid enlargement, no tenderness.  LUNGS: Normal breath sounds bilaterally, no wheezing, bibasilar rales, rhonchi. No use of accessory muscles of respiration.  CARDIOVASCULAR: S1, S2 Irregular. No murmurs, rubs, gallops, clicks.  ABDOMEN: Soft, nontender, nondistended. Bowel sounds present. No organomegaly or mass.  EXTREMITIES: No pedal edema, cyanosis, or clubbing. + 2 pedal & radial pulses b/l.   NEUROLOGIC: Cranial nerves II through XII are intact. No focal Motor or sensory deficits appreciated b/l PSYCHIATRIC: The patient is alert and oriented x 3.   SKIN: No obvious rash, lesion, or ulcer.   LABORATORY PANEL:   CBC Recent Labs  Lab 09/23/17 1518  WBC 7.0  HGB 14.2  HCT 41.5  PLT 106*   ------------------------------------------------------------------------------------------------------------------  Chemistries  Recent Labs  Lab 09/23/17 1518  NA 141  K 4.5  CL 104  CO2 26  GLUCOSE 121*  BUN 18  CREATININE 0.98  CALCIUM 8.6*   ------------------------------------------------------------------------------------------------------------------  Cardiac Enzymes Recent Labs  Lab 09/23/17 1748  TROPONINI <0.03   ------------------------------------------------------------------------------------------------------------------  RADIOLOGY:  Dg Chest 2 View  Result Date: 09/23/2017 CLINICAL DATA:  Worsening shortness of breath over the past few weeks. Weakness for the past week. EXAM: CHEST - 2 VIEW COMPARISON:  11/28/2015. FINDINGS: Mildly progressive enlargement of the cardiac silhouette and mild increase in prominence of the pulmonary vasculature. Mild increase in linear density at both lung bases. Mild peribronchial thickening. Mild increase in size of a small partially loculated left pleural effusion. Stable small right pleural effusion. Thoracic spine degenerative changes. Aortic stent valve. Post CABG changes. Stable left subclavian pacemaker leads. IMPRESSION: 1.  Mildly progressive cardiomegaly and pulmonary vascular congestion. 2. Mildly increased size of a small left pleural effusion and stable small right pleural effusion. 3. Increased bibasilar linear atelectasis. Electronically Signed   By: Claudie Revering M.D.   On: 09/23/2017 15:37     IMPRESSION AND PLAN:   82 year old male with past medical history of chronic atrial fibrillation, chronic systolic CHF, BPH, hypertension, hyperlipidemia, hypothyroidism, GERD who presents to the hospital due to shortness of breath.  1.  CHF- acute on chronic systolic dysfunction.  This is a cause of patient's worsening shortness of breath. - Patient has underlying cardiomyopathy ejection fraction of 30 to 35%.  We will diurese him with IV Lasix, follow I's and O's and daily weights. -Continue  Vasotec, Toprol. -I will consult cardiology, repeat his echocardiogram.  Follow clinically.  2.  History of chronic atrial fibrillation-rate controlled.  Continue Toprol, sotalol. -Continue Coumadin INR subtherapeutic we will have pharmacy dose his Coumadin.  3.  Essential hypertension-continue Toprol, Vasotec.  4.  GERD-continue Protonix.  5.  Hyperlipidemia-continue simvastatin.  6.  BPH-continue Flomax.  7.  Neuropathy-continue gabapentin.  8.  Diabetes type 2 without complication-continue glipizide, metformin. - We will also place him on sliding scale insulin.  9.  Vertigo-we will place the patient on some as needed meclizine.  Patient is supposed to follow-up with ENT as an outpatient next week.   All the records are reviewed and case discussed with ED provider. Management plans discussed with the patient, family and they are in agreement.  CODE STATUS: DNR  TOTAL TIME TAKING CARE OF THIS PATIENT: 45 minutes.    Henreitta Leber M.D on 09/23/2017 at 6:37 PM  Between 7am to 6pm - Pager - [phone]  After 6pm go to [email or web address] - password [email or web address] [facility]  Office   [phone]  CC: Primary care physician; [name], MD

## 2017-09-23 NOTE — ED Notes (Signed)
Pt pacemaker interrogated at this time  

## 2017-09-23 NOTE — ED Triage Notes (Signed)
Worsening shob over the past few weeks, states unable to walk without getting very shob, hx of pacemaker, denies any edema at this time. Appears in NAD.

## 2017-09-23 NOTE — Progress Notes (Signed)
[facility] for warfarin dosing Indication: atrial fibrillation  Allergies  Allergen Reactions  . Cefdinir Other (See Comments)    nightmares  . Adhesive [Tape] Rash  . Sulfa Antibiotics Rash  . Sulfamethoxazole Hives, Itching and Rash    Patient Measurements: Height: 5\' 10"  (177.8 cm) Weight: 165 lb (74.8 kg) IBW/kg (Calculated) : 73  Vital Signs: BP: 125/59 (08/11 1513) Pulse Rate: 68 (08/11 1513)  Labs: Recent Labs    09/23/17 1518 09/23/17 1748  HGB 14.2  --   HCT 41.5  --   PLT 106*  --   LABPROT 19.8*  --   INR 1.70  --   CREATININE 0.98  --   TROPONINI <0.03 <0.03    Estimated Creatinine Clearance: 62.1 mL/min (by C-G formula based on SCr of 0.98 mg/dL).   Medical History: Past Medical History:  Diagnosis Date  . AAA (abdominal aortic aneurysm) ([place])   . Arrhythmia    atrial fibrillation  . Arthritis   . BPH (benign prostatic hyperplasia)   . Cancer ([place])    skin  . CHF (congestive heart failure) ([place])   . Coronary artery disease   . DDD (degenerative disc disease), thoracic   . Diabetes mellitus without complication ([place])   . Esophageal stricture   . GERD (gastroesophageal reflux disease)   . Heart murmur   . Hyperlipidemia   . Hypertension   . Hypothyroidism   . Myocardial infarction ([place])   . Shortness of breath dyspnea     Medications:  Scheduled:  . furosemide  20 mg Intravenous Q12H  . insulin aspart  0-5 Units Subcutaneous QHS  . [START ON 09/24/2017] insulin aspart  0-9 Units Subcutaneous TID WC  . warfarin  6 mg Oral ONCE-1800  . [START ON 09/24/2017] Warfarin - Pharmacist Dosing Inpatient   Does not apply q1800    Assessment: 82 y.o. male with a known history of chronic atrial fibrillation, chronic systolic CHF, CAD, diabetes, hypertension, hyperlipidemia, GERD, hypothyroidism, BPH who presents to the hospital due to shortness of breath. He is on 4mg  of warfarin daily at home but admitting INR  is subtherapeutic. There are no new significant DDIs   Goal of Therapy:  Monitor platelets by anticoagulation protocol: Yes   Plan:  Warfarin 6mg  po once tonight and adjust based on INR in the morning  Dallie Piles, PharmD 09/23/2017,7:02 PM

## 2017-09-23 NOTE — ED Provider Notes (Signed)
[facility] Emergency Department Provider Note  ____________________________________________  Time seen: Approximately 4:56 PM  I have reviewed the triage vital signs and the nursing notes.   HISTORY  Chief Complaint Shortness of Breath   HPI ENDRE COUTTS is a 82 y.o. male with a history of CHF with a EF of 20%, sick sinus syndrome status post pacemaker, CAD, diabetes, hypertension, hyperlipidemia, hypothyroidism, aortic valve stenosis status post porcine valve replacement, atrial fibrillation on Coumadin who presents for evaluation of shortness of breath.  Patient reports progressively worsening shortness of breath for the last month.  More severe over the last few days.  He has been unable to sleep due to severe orthopnea.  He is on Lasix 20 mg every other day and has been taking it as prescribed.  He had been off of his Coumadin for 5 days due to supratherapeutic INR.  He has restarted Coumadin 3 days ago.  He denies weight gain but does not weigh himself at home regularly.  He has had intermittent bilateral lower extremity edema which is minimal today.  He has had a dry cough for the last week.  No fever or chills, no chest pain.  Past Medical History:  Diagnosis Date  . AAA (abdominal aortic aneurysm) ([place])   . Arrhythmia    atrial fibrillation  . Arthritis   . BPH (benign prostatic hyperplasia)   . Cancer ([place])    skin  . CHF (congestive heart failure) ([place])   . Coronary artery disease   . DDD (degenerative disc disease), thoracic   . Diabetes mellitus without complication ([place])   . Esophageal stricture   . GERD (gastroesophageal reflux disease)   . Heart murmur   . Hyperlipidemia   . Hypertension   . Hypothyroidism   . Myocardial infarction ([place])   . Shortness of breath dyspnea     Patient Active Problem List   Diagnosis Date Noted  . AAA (abdominal aortic aneurysm) without rupture ([place]) 02/15/2016  . Palpitations 11/28/2015  . Dyspnea  11/28/2015  . Chronic systolic heart failure ([place]) 04/23/2015  . HTN (hypertension) 04/23/2015  . Diabetes ([place]) 04/23/2015  . Sick sinus syndrome ([place]) 07/03/2014  . Atrial fibrillation ([place]) 06/17/2014    Past Surgical History:  Procedure Laterality Date  . AORTIC VALVE REPLACEMENT  1995   Bioprosthetic - [place]  . APPENDECTOMY    . CARDIAC CATHETERIZATION  1994 x 2 with PCTA of RCA  . CARDIAC VALVE REPLACEMENT     Bovine transcatheter heart valve  . COLONOSCOPY  2015  . CORONARY ARTERY BYPASS GRAFT  1995   x 4 Vessels - [facility]  . ELECTROPHYSIOLOGIC STUDY N/A 08/06/2014   Procedure: Cardioversion;  Surgeon: [name], MD;  Location: [place] [facility];  Service: Cardiovascular;  Laterality: N/A;  . ELECTROPHYSIOLOGIC STUDY N/A 06/18/2014   Procedure: CARDIOVERSION;  Surgeon: [name], MD;  Location: [facility];  Service: Cardiovascular;  Laterality: N/A;  . ESOPHAGOGASTRODUODENOSCOPY  2014  . GREAT TOE ARTHRODESIS, INTERPHALANGEAL JOINT Left   . HERNIA REPAIR Left    Inguinal  . PACEMAKER INSERTION N/A 07/02/2014   Procedure: INSERTION PACEMAKER/DUAL CHAMBER  INITIAL IMPLANT;  Surgeon: [name], MD;  Location: [facility] OR[facility];  Service: Cardiovascular;  Laterality: N/A;  . SKIN BIOPSY      Prior to Admission medications   Medication Sig Start Date End Date Taking? Authorizing Provider  aspirin EC 81 MG EC tablet Take 1 tablet (81 mg total) by mouth daily. 04/13/15  Yes Dustin Flock, MD  cyanocobalamin 1000 MCG tablet Take 1,000 mcg by mouth as needed.    Yes [provider]  dapagliflozin propanediol (FARXIGA) 10 MG TABS tablet Take 10 mg by mouth daily. Study drug   Yes [provider]  enalapril (VASOTEC) 5 MG tablet Take 1 tablet (5 mg total) by mouth daily. Patient taking differently: Take 5 mg by mouth 2 (two) times daily.  11/30/15  Yes Mody, Ulice Bold, MD  furosemide (LASIX) 20 MG tablet Take 20 mg by mouth every morning.    Yes  [provider]  gabapentin (NEURONTIN) 300 MG capsule Take 300 mg by mouth at bedtime.   Yes [provider]  glipiZIDE (GLUCOTROL XL) 5 MG 24 hr tablet Take 5 mg by mouth every morning. 01/21/15  Yes [provider]  levothyroxine (SYNTHROID, LEVOTHROID) 112 MCG tablet Take 112 mcg by mouth daily before breakfast.   Yes [provider]  metFORMIN (GLUCOPHAGE) 500 MG tablet Take 500 mg by mouth 2 (two) times daily.    Yes [provider]  metoprolol succinate (TOPROL-XL) 25 MG 24 hr tablet Take 25 mg by mouth daily.   Yes [provider]  nitroGLYCERIN (NITROSTAT) 0.4 MG SL tablet Place 0.4 mg under the tongue every 5 (five) minutes as needed for chest pain.   Yes [provider]  omeprazole (PRILOSEC) 20 MG capsule Take 20 mg by mouth every morning.    Yes [provider]  simvastatin (ZOCOR) 40 MG tablet Take 40 mg by mouth every evening.    Yes [provider]  sotalol (BETAPACE) 120 MG tablet Take 1 tablet (120 mg total) by mouth 2 (two) times daily. 11/30/15  Yes Mody, Ulice Bold, MD  tamsulosin (FLOMAX) 0.4 MG CAPS capsule Take 0.4 mg by mouth every morning.    Yes [provider]  temazepam (RESTORIL) 15 MG capsule Take 30 mg by mouth at bedtime.    Yes [provider]  warfarin (COUMADIN) 1 MG tablet Take 1 mg by mouth every evening. Pt takes with 3mg  tablet for a total dose of 4mg .   Yes [provider]  warfarin (COUMADIN) 3 MG tablet Take 3 mg by mouth every evening. Pt takes with 1mg  tablet for a total dose of 4mg .   Yes [provider]  acetaminophen (TYLENOL) 500 MG tablet Take 500 mg by mouth every 6 (six) hours as needed for mild pain, moderate pain or fever.     [provider]  loratadine (CLARITIN) 10 MG tablet Take 1 tablet by mouth daily. 05/21/15 08/31/17  [provider]    Allergies Cefdinir; Adhesive [tape]; Sulfa antibiotics; and  Sulfamethoxazole  Family History  Problem Relation Age of Onset  . Cancer Mother   . Cancer Father   . Kidney disease Sister   . Heart failure Brother     Social History Social History   Tobacco Use  . Smoking status: Former Smoker    Packs/day: 0.50    Years: 15.00    Pack years: 7.50    Types: Cigarettes    Last attempt to quit: 04/22/1985    Years since quitting: 32.4  . Smokeless tobacco: Former Systems developer    Types: Chew  Substance Use Topics  . Alcohol use: No  . Drug use: No    Review of Systems  Constitutional: Negative for fever. Eyes: Negative for visual changes. ENT: Negative for sore throat. Neck: No neck pain  Cardiovascular: Negative for chest pain. +  orthopnea Respiratory: + shortness of breath. Gastrointestinal: Negative for abdominal pain, vomiting or diarrhea. Genitourinary: Negative for dysuria. Musculoskeletal: Negative for back pain. Skin: Negative for rash. Neurological: Negative for headaches, weakness or numbness. Psych: No SI or HI  ____________________________________________   PHYSICAL EXAM:  VITAL SIGNS: ED Triage Vitals  Enc Vitals Group     BP 09/23/17 1513 (!) 125/59     Pulse Rate 09/23/17 1513 68     Resp 09/23/17 1513 16     Temp --      Temp src --      SpO2 09/23/17 1513 94 %     Weight 09/23/17 1459 165 lb (74.8 kg)     Height 09/23/17 1459 5\' 10"  (1.778 m)     Head Circumference --      Peak Flow --      Pain Score 09/23/17 1459 0     Pain Loc --      Pain Edu? --      Excl. in [place]? --     Constitutional: Alert and oriented. Well appearing and in no apparent distress. HEENT:      Head: Normocephalic and atraumatic.         Eyes: Conjunctivae are normal. Sclera is non-icteric.       Mouth/Throat: Mucous membranes are moist.       Neck: Supple with no signs of meningismus. Cardiovascular: Irregularly irregular rhythm with normal rate. No murmurs, gallops, or rubs. 2+ symmetrical distal pulses are present in all  extremities. No JVD. Respiratory: Increased WOB, tachypnea, normal sats. Lungs are clear to auscultation bilaterally with faint crackles on bilateral bases Gastrointestinal: Soft, non tender, and non distended with positive bowel sounds. No rebound or guarding. Musculoskeletal: Trace pitting edema bilateral lower extremity  neurologic: Normal speech and language. Face is symmetric. Moving all extremities. No gross focal neurologic deficits are appreciated. Skin: Skin is warm, dry and intact. No rash noted. Psychiatric: Mood and affect are normal. Speech and behavior are normal.  ____________________________________________   LABS (all labs ordered are listed, but only abnormal results are displayed)  Labs Reviewed  CBC - Abnormal; Notable for the following components:      Result Value   RBC 4.17 (*)    MCH 34.1 (*)    RDW 15.1 (*)    Platelets 106 (*)    All other components within normal limits  BASIC METABOLIC PANEL - Abnormal; Notable for the following components:   Glucose, Bld 121 (*)    Calcium 8.6 (*)    All other components within normal limits  PROTIME-INR - Abnormal; Notable for the following components:   Prothrombin Time 19.8 (*)    All other components within normal limits  BRAIN NATRIURETIC PEPTIDE - Abnormal; Notable for the following components:   B Natriuretic Peptide 4,061.0 (*)    All other components within normal limits  TROPONIN I  TROPONIN I   ____________________________________________  EKG  ED ECG REPORT I, Rudene Re, the attending physician, personally viewed and interpreted this ECG.  Atrial fibrillation with occasional ventricular paced complexes, rate of 70, prolonged QTC, left axis deviation, no concordant ST elevations or depressions.  Unchanged from prior. ____________________________________________  RADIOLOGY  I have personally reviewed the images performed during this visit and I agree with the Radiologist's  read.   Interpretation by Radiologist:  Dg Chest 2 View  Result Date: 09/23/2017 CLINICAL DATA:  Worsening shortness of breath over the past few weeks. Weakness for the past week.  EXAM: CHEST - 2 VIEW COMPARISON:  11/28/2015. FINDINGS: Mildly progressive enlargement of the cardiac silhouette and mild increase in prominence of the pulmonary vasculature. Mild increase in linear density at both lung bases. Mild peribronchial thickening. Mild increase in size of a small partially loculated left pleural effusion. Stable small right pleural effusion. Thoracic spine degenerative changes. Aortic stent valve. Post CABG changes. Stable left subclavian pacemaker leads. IMPRESSION: 1. Mildly progressive cardiomegaly and pulmonary vascular congestion. 2. Mildly increased size of a small left pleural effusion and stable small right pleural effusion. 3. Increased bibasilar linear atelectasis. Electronically Signed   By: Claudie Revering M.D.   On: 09/23/2017 15:37     ____________________________________________   PROCEDURES  Procedure(s) performed: None Procedures Critical Care performed: yes  CRITICAL CARE Performed by: Rudene Re  ?  Total critical care time: 30 min  Critical care time was exclusive of separately billable procedures and treating other patients.  Critical care was necessary to treat or prevent imminent or life-threatening deterioration.  Critical care was time spent personally by me on the following activities: development of treatment plan with patient and/or surrogate as well as nursing, discussions with consultants, evaluation of patient's response to treatment, examination of patient, obtaining history from patient or surrogate, ordering and performing treatments and interventions, ordering and review of laboratory studies, ordering and review of radiographic studies, pulse oximetry and re-evaluation of patient's  condition.  ____________________________________________   INITIAL IMPRESSION / ASSESSMENT AND PLAN / ED COURSE  82 y.o. male with a history of CHF with a EF of 20%, sick sinus syndrome status post pacemaker, CAD, diabetes, hypertension, hyperlipidemia, hypothyroidism, aortic valve stenosis status post porcine valve replacement, atrial fibrillation on Coumadin who presents for evaluation of shortness of breath.  Patient has trace pitting edema, no elevated JVD, faint crackles on bilateral lung bases, there is no oxygen requirement at rest however patient does look dyspneic and has slightly increased work of breathing.  His EKG shows atrial fibrillation with intermittent ventricular paced complexes, no ischemic changes.  Chest x-ray concerning for worsening cardiomegaly, pulmonary congestion, and bilateral pleural effusion.  Presentation concerning for CHF exacerbation.  Plan to give IV Lasix, allow patient to diurese in the emergency department and reassess.  If patient remains with no oxygen requirement both at rest and with ambulation will have him discharged home on Lasix daily until his follow-up with Dr. Saralyn Pilar in 4 days.     _________________________ 6:03 PM on 09/23/2017 -----------------------------------------  BNP greater than 4000.  After 20 mg of IV Lasix patient diuresed 600 cc of urine output.  Continues to feel severe short of breath, hypoxic to 88% just standing from the bed and tachypneic to the mid 30s with minimal exertion.  Will admit to the hospitalist service.   As part of my medical decision making, I reviewed the following data within the [place] notes reviewed and incorporated, Labs reviewed , EKG interpreted , Old EKG reviewed, Old chart reviewed, Radiograph reviewed , Discussed with admitting physician , Notes from prior ED visits and [place] Controlled Substance Database    Pertinent labs & imaging results that were available during my care of  the patient were reviewed by me and considered in my medical decision making (see chart for details).    ____________________________________________   FINAL CLINICAL IMPRESSION(S) / ED DIAGNOSES  Final diagnoses:  Acute respiratory failure with hypoxia (HCC)  Acute on chronic congestive heart failure, unspecified heart failure type ([place])  NEW MEDICATIONS STARTED DURING THIS VISIT:  ED Discharge Orders    None       Note:  This document was prepared using Dragon voice recognition software and may include unintentional dictation errors.    Alfred Levins, [place], MD 09/23/17 [phone]

## 2017-09-23 NOTE — Plan of Care (Signed)
Patient was admitted at 31. No complaints of pain. Patient is diuresing. Pateint profile completed. Education initiated.

## 2017-09-24 LAB — BASIC METABOLIC PANEL
Anion gap: 6 (ref 5–15)
BUN: 19 mg/dL (ref 8–23)
CALCIUM: 8.6 mg/dL — AB (ref 8.9–10.3)
CHLORIDE: 106 mmol/L (ref 98–111)
CO2: 30 mmol/L (ref 22–32)
CREATININE: 1.05 mg/dL (ref 0.61–1.24)
GFR calc Af Amer: >60 mL/min (ref >60)
Glucose, Bld: 94 mg/dL (ref 70–99)
Potassium: 4.1 mmol/L (ref 3.5–5.1)
SODIUM: 142 mmol/L (ref 135–145)

## 2017-09-24 LAB — CBC
HCT: 36.2 % — ABNORMAL LOW (ref 40.0–52.0)
Hemoglobin: 12.4 g/dL — ABNORMAL LOW (ref 13.0–18.0)
MCH: 34 pg (ref 26.0–34.0)
MCHC: 34.3 g/dL (ref 32.0–36.0)
MCV: 99.1 fL (ref 80.0–100.0)
PLATELETS: 88 10*3/uL — AB (ref 150–440)
RBC: 3.66 MIL/uL — ABNORMAL LOW (ref 4.40–5.90)
RDW: 15 % — AB (ref 11.5–14.5)
WBC: 6.1 10*3/uL (ref 3.8–10.6)

## 2017-09-24 LAB — PROTIME-INR
INR: 2.01
PROTHROMBIN TIME: 22.6 s — AB (ref 11.4–15.2)

## 2017-09-24 LAB — GLUCOSE, CAPILLARY
GLUCOSE-CAPILLARY: 98 mg/dL (ref 70–99)
GLUCOSE-CAPILLARY: 96 mg/dL (ref 70–99)
Glucose-Capillary: 120 mg/dL — ABNORMAL HIGH (ref 70–99)
Glucose-Capillary: 179 mg/dL — ABNORMAL HIGH (ref 70–99)

## 2017-09-24 MED ORDER — FLUTICASONE PROPIONATE 50 MCG/ACT NA SUSP
2.0000 | Freq: Every day | NASAL | Status: DC
  Administered 2017-09-24 – 2017-09-25 (×2): 2 via NASAL
  Filled 2017-09-24: qty 16

## 2017-09-24 MED ORDER — WARFARIN SODIUM 5 MG PO TABS
5.0000 mg | ORAL_TABLET | Freq: Once | ORAL | Status: AC
  Administered 2017-09-24: 5 mg via ORAL
  Filled 2017-09-24: qty 1

## 2017-09-24 NOTE — Consult Note (Signed)
[facility] Consultation Note  Patient ID: Derrick Cochran, MRN: [id number], DOB/AGE: [birth year] 82 y.o. Admit date: 09/23/2017   Date of Consult: 09/24/2017 Primary Physician: [name], MD Primary Cardiologist: Raynelle Chary  Chief Complaint:  Chief Complaint  Patient presents with  . Shortness of Breath   Reason for Consult: Congestive heart failure  HPI: 82 y.o. male with known chronic systolic dysfunction congestive heart failure with ejection fraction of 30% status post aortic valve replacement and coronary artery bypass grafting with myocardial infarction.  The patient also has had chronic atrial fibrillation status post pacemaker placement for sick sinus syndrome with essential hypertension mixed hyperlipidemia and diabetes.  Patient does have an aortic aneurysm as well well treated.  Currently the patient has had appropriate medication management for all of above but has had an increase in shortness of breath PND and orthopnea over the last 3 to 4 weeks.  This has resulted in severe shortness of breath with any physical activity relieved by rest.  The patient has not necessarily gained weight during this period of time.  Patient has had admission to the hospital with an EKG showing atrial fibrillation with controlled ventricular rate and left bundle branch block with a normal troponin and no current evidence of myocardial infarction.  Intravenous Lasix has considerably helped with symptoms but he is still slightly short of breath.  Currently he is hemodynamically stable  Past Medical History:  Diagnosis Date  . AAA (abdominal aortic aneurysm) ([place])   . Arrhythmia    atrial fibrillation  . Arthritis   . BPH (benign prostatic hyperplasia)   . Cancer ([place])    skin  . CHF (congestive heart failure) ([place])   . Coronary artery disease   . DDD (degenerative disc disease), thoracic   . Diabetes mellitus without complication ([place])   . Esophageal stricture   . GERD  (gastroesophageal reflux disease)   . Heart murmur   . Hyperlipidemia   . Hypertension   . Hypothyroidism   . Myocardial infarction ([place])   . Shortness of breath dyspnea       Surgical History:  Past Surgical History:  Procedure Laterality Date  . AORTIC VALVE REPLACEMENT  1995   Bioprosthetic - [place]  . APPENDECTOMY    . CARDIAC CATHETERIZATION  1994 x 2 with PCTA of RCA  . CARDIAC VALVE REPLACEMENT     Bovine transcatheter heart valve  . COLONOSCOPY  2015  . CORONARY ARTERY BYPASS GRAFT  1995   x 4 Vessels - [facility]  . ELECTROPHYSIOLOGIC STUDY N/A 08/06/2014   Procedure: Cardioversion;  Surgeon: [name], MD;  Location: [facility];  Service: Cardiovascular;  Laterality: N/A;  . ELECTROPHYSIOLOGIC STUDY N/A 06/18/2014   Procedure: CARDIOVERSION;  Surgeon: [name], MD;  Location: [facility];  Service: Cardiovascular;  Laterality: N/A;  . ESOPHAGOGASTRODUODENOSCOPY  2014  . GREAT TOE ARTHRODESIS, INTERPHALANGEAL JOINT Left   . HERNIA REPAIR Left    Inguinal  . PACEMAKER INSERTION N/A 07/02/2014   Procedure: INSERTION PACEMAKER/DUAL CHAMBER  INITIAL IMPLANT;  Surgeon: [name], MD;  Location: [facility];  Service: Cardiovascular;  Laterality: N/A;  . SKIN BIOPSY       Home Meds: Prior to Admission medications   Medication Sig Start Date End Date Taking? Authorizing Provider  aspirin EC 81 MG EC tablet Take 1 tablet (81 mg total) by mouth daily. 04/13/15  Yes Dustin Flock, MD  cyanocobalamin 1000 MCG tablet Take 1,000 mcg by mouth as  needed.    Yes [provider]  dapagliflozin propanediol (FARXIGA) 10 MG TABS tablet Take 10 mg by mouth daily. Study drug   Yes [provider]  enalapril (VASOTEC) 5 MG tablet Take 1 tablet (5 mg total) by mouth daily. Patient taking differently: Take 5 mg by mouth 2 (two) times daily.  11/30/15  Yes Mody, Ulice Bold, MD  furosemide (LASIX) 20 MG tablet Take 20 mg by mouth every morning.    Yes  [provider]  gabapentin (NEURONTIN) 300 MG capsule Take 300 mg by mouth at bedtime.   Yes [provider]  glipiZIDE (GLUCOTROL XL) 5 MG 24 hr tablet Take 5 mg by mouth every morning. 01/21/15  Yes [provider]  levothyroxine (SYNTHROID, LEVOTHROID) 112 MCG tablet Take 112 mcg by mouth daily before breakfast.   Yes [provider]  metFORMIN (GLUCOPHAGE) 500 MG tablet Take 500 mg by mouth 2 (two) times daily.    Yes [provider]  metoprolol succinate (TOPROL-XL) 25 MG 24 hr tablet Take 25 mg by mouth daily.   Yes [provider]  nitroGLYCERIN (NITROSTAT) 0.4 MG SL tablet Place 0.4 mg under the tongue every 5 (five) minutes as needed for chest pain.   Yes [provider]  omeprazole (PRILOSEC) 20 MG capsule Take 20 mg by mouth every morning.    Yes [provider]  simvastatin (ZOCOR) 40 MG tablet Take 40 mg by mouth every evening.    Yes [provider]  sotalol (BETAPACE) 120 MG tablet Take 1 tablet (120 mg total) by mouth 2 (two) times daily. 11/30/15  Yes Mody, Ulice Bold, MD  tamsulosin (FLOMAX) 0.4 MG CAPS capsule Take 0.4 mg by mouth every morning.    Yes [provider]  temazepam (RESTORIL) 15 MG capsule Take 30 mg by mouth at bedtime.    Yes [provider]  warfarin (COUMADIN) 1 MG tablet Take 1 mg by mouth every evening. Pt takes with 3mg  tablet for a total dose of 4mg .   Yes [provider]  warfarin (COUMADIN) 3 MG tablet Take 3 mg by mouth every evening. Pt takes with 1mg  tablet for a total dose of 4mg .   Yes [provider]  acetaminophen (TYLENOL) 500 MG tablet Take 500 mg by mouth every 6 (six) hours as needed for mild pain, moderate pain or fever.     [provider]  loratadine (CLARITIN) 10 MG tablet Take 1 tablet by mouth daily. 05/21/15 08/31/17  [provider]    Inpatient Medications:  . aspirin EC  81 mg Oral Daily  . enalapril  5 mg  Oral BID  . furosemide  20 mg Intravenous Q12H  . gabapentin  300 mg Oral QHS  . glipiZIDE  5 mg Oral BH-q7a  . insulin aspart  0-5 Units Subcutaneous QHS  . insulin aspart  0-9 Units Subcutaneous TID WC  . levothyroxine  112 mcg Oral QAC breakfast  . metFORMIN  500 mg Oral BID WC  . metoprolol succinate  25 mg Oral Daily  . pantoprazole  40 mg Oral Daily  . pneumococcal 23 valent vaccine  0.5 mL Intramuscular Tomorrow-1000  . simvastatin  40 mg Oral QPM  . sodium chloride flush  3 mL Intravenous Q12H  . sotalol  120 mg Oral BID  . tamsulosin  0.4 mg Oral q morning - 10a  . temazepam  30 mg Oral QHS  . cyanocobalamin  1,000 mcg Oral Daily  . Warfarin -  Pharmacist Dosing Inpatient   Does not apply q1800     Allergies:  Allergies  Allergen Reactions  . Cefdinir Other (See Comments)    nightmares  . Adhesive [Tape] Rash  . Sulfa Antibiotics Rash  . Sulfamethoxazole Hives, Itching and Rash    Social History   Socioeconomic History  . Marital status: Married    Spouse name: Not on file  . Number of children: Not on file  . Years of education: Not on file  . Highest education level: Not on file  Occupational History  . Not on file  Social Needs  . Financial resource strain: Not on file  . Food insecurity:    Worry: Not on file    Inability: Not on file  . Transportation needs:    Medical: Not on file    Non-medical: Not on file  Tobacco Use  . Smoking status: Former Smoker    Packs/day: 0.50    Years: 15.00    Pack years: 7.50    Types: Cigarettes    Last attempt to quit: 04/22/1985    Years since quitting: 32.4  . Smokeless tobacco: Former Systems developer    Types: Chew  Substance and Sexual Activity  . Alcohol use: No  . Drug use: No  . Sexual activity: Not on file  Lifestyle  . Physical activity:    Days per week: Not on file    Minutes per session: Not on file  . Stress: Not on file  Relationships  . Social connections:    Talks on phone: Not on file    Gets  together: Not on file    Attends religious service: Not on file    Active member of club or organization: Not on file    Attends meetings of clubs or organizations: Not on file    Relationship status: Not on file  . Intimate partner violence:    Fear of current or ex partner: Not on file    Emotionally abused: Not on file    Physically abused: Not on file    Forced sexual activity: Not on file  Other Topics Concern  . Not on file  Social History Narrative  . Not on file     Family History  Problem Relation Age of Onset  . Cancer Mother   . Lung cancer Father   . Kidney disease Sister   . Heart failure Brother      Review of Systems Positive for redness of breath PND orthopnea Negative for: General:  chills, fever, night sweats or weight changes.  Cardiovascular: Positive for PND orthopnea gated for syncope dizziness  Dermatological skin lesions rashes Respiratory: Cough congestion Urologic: Frequent urination urination at night and hematuria Abdominal: negative for nausea, vomiting, diarrhea, bright red blood per rectum, melena, or hematemesis Neurologic: negative for visual changes, and/or hearing changes  All other systems reviewed and are otherwise negative except as noted above.  Labs: Recent Labs    09/23/17 1518 09/23/17 1748  TROPONINI <0.03 <0.03   Lab Results  Component Value Date   WBC 6.1 09/24/2017   HGB 12.4 (L) 09/24/2017   HCT 36.2 (L) 09/24/2017   MCV 99.1 09/24/2017   PLT 88 (L) 09/24/2017    Recent Labs  Lab 09/24/17 0417  NA 142  K 4.1  CL 106  CO2 30  BUN 19  CREATININE 1.05  CALCIUM 8.6*  GLUCOSE 94   No results found for: CHOL, HDL, LDLCALC, TRIG No results found for: DDIMER  Radiology/Studies:  Dg Chest 2 View  Result Date: 09/23/2017 CLINICAL DATA:  Worsening shortness of breath over the past few weeks. Weakness for the past week. EXAM: CHEST - 2 VIEW COMPARISON:  11/28/2015. FINDINGS: Mildly progressive enlargement of the  cardiac silhouette and mild increase in prominence of the pulmonary vasculature. Mild increase in linear density at both lung bases. Mild peribronchial thickening. Mild increase in size of a small partially loculated left pleural effusion. Stable small right pleural effusion. Thoracic spine degenerative changes. Aortic stent valve. Post CABG changes. Stable left subclavian pacemaker leads. IMPRESSION: 1. Mildly progressive cardiomegaly and pulmonary vascular congestion. 2. Mildly increased size of a small left pleural effusion and stable small right pleural effusion. 3. Increased bibasilar linear atelectasis. Electronically Signed   By: Claudie Revering M.D.   On: 09/23/2017 15:37    EKG: Atrial fibrillation with controlled ventricular rate and left bundle branch block  Weights: Filed Weights   09/23/17 1459 09/23/17 2016 09/24/17 0318  Weight: 74.8 kg 72.9 kg 73.3 kg     Physical Exam: Blood pressure 109/64, pulse 78, temperature 97.9 F (36.6 C), temperature source Oral, resp. rate 16, height 5\' 10"  (1.778 m), weight 73.3 kg, SpO2 95 %. Body mass index is 23.17 kg/m. General: Well developed, well nourished, in no acute distress. Head eyes ears nose throat: Normocephalic, atraumatic, sclera non-icteric, no xanthomas, nares are without discharge. No apparent thyromegaly and/or mass  Lungs: Normal respiratory effort.  no wheezes, no rales, no rhonchi.  Heart: Irregular with normal S1 S2.  2+ aortic murmur gallop, no rub, PMI is normal size and placement, carotid upstroke normal without bruit, jugular venous pressure is normal Abdomen: Soft, non-tender, non-distended with normoactive bowel sounds. No hepatomegaly. No rebound/guarding. No obvious abdominal masses. Abdominal aorta is normal size without bruit Extremities: No edema. no cyanosis, no clubbing, no ulcers  Peripheral : 2+ bilateral upper extremity pulses, 2+ bilateral femoral pulses, 2+ bilateral dorsal pedal pulse Neuro: Alert and  oriented. No facial asymmetry. No focal deficit. Moves all extremities spontaneously. Musculoskeletal: Normal muscle tone without kyphosis Psych:  Responds to questions appropriately with a normal affect.    Assessment: 82 year old male with aortic valve replacement coronary artery bypass surgery chronic nonvalvular atrial fibrillation essential hypertension mixed hyperlipidemia peripheral vascular disease on appropriate medication management having acute on chronic systolic dysfunction congestive heart failure without evidence of myocardial infarction  Plan: 1.  Continue intravenous Lasix for improvements of current acute on chronic systolic dysfunction congestive heart failure 2.  Continue anticoagulation with warfarin for further risk reduction in stroke with atrial fibrillation 3.  No adjustments of medication management for cardiomyopathy currently stable on ACE inhibitor although would consider discontinuation of sotalol and use metoprolol only due to no evidence of maintenance of normal sinus rhythm 4.  Continue high intensity cholesterol therapy diabetes control and the antihypertension medication management 5.  No further cardiac diagnostics necessary at this time 6.  Further ambulation and follow for improvements of symptoms and further adjustments of medication management as necessary Signed, Corey Skains M.D. [facility] 09/24/2017, 9:09 AM

## 2017-09-24 NOTE — Progress Notes (Signed)
[facility]                                                                                                                                                                                  Patient Demographics   Derrick Cochran, is a 82 y.o. male, DOB - [birth year], ZWC:[id number]  Admit date - 09/23/2017   Admitting Physician Henreitta Leber, MD  Outpatient Primary MD for the patient is [name], MD   LOS - 1  Subjective: Patient doing better shortness of breath improved.  Denies any chest pain    Review of Systems:   CONSTITUTIONAL: No documented fever. No fatigue, weakness. No weight gain, no weight loss.  EYES: No blurry or double vision.  ENT: No tinnitus. No postnasal drip. No redness of the oropharynx.  RESPIRATORY: No cough, no wheeze, no hemoptysis.  Positive  dyspnea.  CARDIOVASCULAR: No chest pain. No orthopnea. No palpitations. No syncope.  GASTROINTESTINAL: No nausea, no vomiting or diarrhea. No abdominal pain. No melena or hematochezia.  GENITOURINARY: No dysuria or hematuria.  ENDOCRINE: No polyuria or nocturia. No heat or cold intolerance.  HEMATOLOGY: No anemia. No bruising. No bleeding.  INTEGUMENTARY: No rashes. No lesions.  MUSCULOSKELETAL: No arthritis. No swelling. No gout.  NEUROLOGIC: No numbness, tingling, or ataxia. No seizure-type activity.  PSYCHIATRIC: No anxiety. No insomnia. No ADD.    Vitals:   Vitals:   09/23/17 2016 09/23/17 2018 09/24/17 0318 09/24/17 0800  BP: (!) 72/53 125/70 (!) 100/45 109/64  Pulse: 65 70 (!) 59 78  Resp: 18  18 16   Temp: 97.9 F (36.6 C)  98.3 F (36.8 C) 97.9 F (36.6 C)  TempSrc:    Oral  SpO2: 93%  90% 95%  Weight: 72.9 kg  73.3 kg   Height: 5\' 10"  (1.778 m)       Wt Readings from Last 3 Encounters:  09/24/17 73.3 kg  08/31/17 76 kg  06/27/17 75.8 kg     Intake/Output Summary (Last 24 hours) at 09/24/2017 1405 Last data filed at 09/24/2017 1350 Gross per 24  hour  Intake 600 ml  Output 1980 ml  Net -1380 ml    Physical Exam:   GENERAL: Pleasant-appearing in no apparent distress.  HEAD, EYES, EARS, NOSE AND THROAT: Atraumatic, normocephalic. Extraocular muscles are intact. Pupils equal and reactive to light. Sclerae anicteric. No conjunctival injection. No oro-pharyngeal erythema.  NECK: Supple. There is no jugular venous distention. No bruits, no lymphadenopathy, no thyromegaly.  HEART: Regular rate and rhythm,. No murmurs, no rubs, no clicks.  LUNGS: Clear to auscultation bilaterally. No rales or rhonchi. No wheezes.  ABDOMEN: Soft, flat,  nontender, nondistended. Has good bowel sounds. No hepatosplenomegaly appreciated.  EXTREMITIES: No evidence of any cyanosis, clubbing, or peripheral edema.  +2 pedal and radial pulses bilaterally.  NEUROLOGIC: The patient is alert, awake, and oriented x3 with no focal motor or sensory deficits appreciated bilaterally.  SKIN: Moist and warm with no rashes appreciated.  Psych: Not anxious, depressed LN: No inguinal LN enlargement    Antibiotics   Anti-infectives (From admission, onward)   None      Medications   Scheduled Meds: . aspirin EC  81 mg Oral Daily  . enalapril  5 mg Oral BID  . fluticasone  2 spray Each Nare Daily  . furosemide  20 mg Intravenous Q12H  . gabapentin  300 mg Oral QHS  . glipiZIDE  5 mg Oral BH-q7a  . insulin aspart  0-5 Units Subcutaneous QHS  . insulin aspart  0-9 Units Subcutaneous TID WC  . levothyroxine  112 mcg Oral QAC breakfast  . metFORMIN  500 mg Oral BID WC  . metoprolol succinate  25 mg Oral Daily  . pantoprazole  40 mg Oral Daily  . simvastatin  40 mg Oral QPM  . sodium chloride flush  3 mL Intravenous Q12H  . sotalol  120 mg Oral BID  . tamsulosin  0.4 mg Oral q morning - 10a  . temazepam  30 mg Oral QHS  . cyanocobalamin  1,000 mcg Oral Daily  . warfarin  5 mg Oral ONCE-1800  . Warfarin - Pharmacist Dosing Inpatient   Does not apply q1800    Continuous Infusions: PRN Meds:.acetaminophen **OR** acetaminophen, meclizine, ondansetron **OR** ondansetron (ZOFRAN) IV   Data Review:   Micro Results No results found for this or any previous visit (from the past 240 hour(s)).  Radiology Reports Dg Chest 2 View  Result Date: 09/23/2017 CLINICAL DATA:  Worsening shortness of breath over the past few weeks. Weakness for the past week. EXAM: CHEST - 2 VIEW COMPARISON:  11/28/2015. FINDINGS: Mildly progressive enlargement of the cardiac silhouette and mild increase in prominence of the pulmonary vasculature. Mild increase in linear density at both lung bases. Mild peribronchial thickening. Mild increase in size of a small partially loculated left pleural effusion. Stable small right pleural effusion. Thoracic spine degenerative changes. Aortic stent valve. Post CABG changes. Stable left subclavian pacemaker leads. IMPRESSION: 1. Mildly progressive cardiomegaly and pulmonary vascular congestion. 2. Mildly increased size of a small left pleural effusion and stable small right pleural effusion. 3. Increased bibasilar linear atelectasis. Electronically Signed   By: Claudie Revering M.D.   On: 09/23/2017 15:37     CBC Recent Labs  Lab 09/23/17 1518 09/24/17 0417  WBC 7.0 6.1  HGB 14.2 12.4*  HCT 41.5 36.2*  PLT 106* 88*  MCV 99.5 99.1  MCH 34.1* 34.0  MCHC 34.3 34.3  RDW 15.1* 15.0*    Chemistries  Recent Labs  Lab 09/23/17 1518 09/24/17 0417  NA 141 142  K 4.5 4.1  CL 104 106  CO2 26 30  GLUCOSE 121* 94  BUN 18 19  CREATININE 0.98 1.05  CALCIUM 8.6* 8.6*   ------------------------------------------------------------------------------------------------------------------ estimated creatinine clearance is 57.9 mL/min (by C-G formula based on SCr of 1.05 mg/dL). ------------------------------------------------------------------------------------------------------------------ No results for input(s): HGBA1C in the last 72  hours. ------------------------------------------------------------------------------------------------------------------ No results for input(s): CHOL, HDL, LDLCALC, TRIG, CHOLHDL, LDLDIRECT in the last 72 hours. ------------------------------------------------------------------------------------------------------------------ No results for input(s): TSH, T4TOTAL, T3FREE, THYROIDAB in the last 72 hours.  Invalid input(s): FREET3 ------------------------------------------------------------------------------------------------------------------ No results  for input(s): VITAMINB12, FOLATE, FERRITIN, TIBC, IRON, RETICCTPCT in the last 72 hours.  Coagulation profile Recent Labs  Lab 09/23/17 1518 09/24/17 0417  INR 1.70 2.01    No results for input(s): DDIMER in the last 72 hours.  Cardiac Enzymes Recent Labs  Lab 09/23/17 1518 09/23/17 1748  TROPONINI <0.03 <0.03   ------------------------------------------------------------------------------------------------------------------ Invalid input(s): POCBNP    Assessment & Plan   82 year old male with past medical history of chronic atrial fibrillation, chronic systolic CHF, BPH, hypertension, hyperlipidemia, hypothyroidism, GERD who presents to the hospital due to shortness of breath.  1.  CHF- acute on chronic systolic dysfunction.    Patient's symptoms improved with IV diuresis continue Lasix monitor renal function -Continue Vasotec, Toprol. -Seen by cardiology  2.  History of chronic atrial fibrillation-rate controlled.  Continue Toprol, sotalol. -Continue Coumadin pharmacy to dose  3.  Essential hypertension-continue Toprol, Vasotec.  4.  GERD-continue Protonix.  5.  Hyperlipidemia-continue simvastatin.  6.  BPH-continue Flomax.  7.  Neuropathy-continue gabapentin.  8.  Diabetes type 2 without complication-continue glipizide, metformin. -  Continue sliding-scale insulin  9.  Vertigo-we will place the  patient on some as needed meclizine.  Patient is supposed to follow-up with ENT as an outpatient next week.       Code Status Orders  (From admission, onward)         Start     Ordered   09/23/17 2030  Do not attempt resuscitation (DNR)  Continuous    Question Answer Comment  In the event of cardiac or respiratory ARREST Do not call a "code blue"   In the event of cardiac or respiratory ARREST Do not perform Intubation, CPR, defibrillation or ACLS   In the event of cardiac or respiratory ARREST Use medication by any route, position, wound care, and other measures to relive pain and suffering. May use oxygen, suction and manual treatment of airway obstruction as needed for comfort.      09/23/17 2029        Code Status History    Date Active Date Inactive Code Status Order ID Comments User Context   11/28/2015 2035 11/30/2015 1650 Full Code [id number]  Theodoro Grist, MD ED   04/11/2015 1022 04/13/2015 1603 Full Code [id number]  Dustin Flock, MD ED   07/02/2014 1547 07/04/2014 1352 Full Code [id number]  [name], MD Inpatient    Advance Directive Documentation     Most Recent Value  Type of Advance Directive  Living will  Pre-existing out of facility DNR order (yellow form or pink MOST form)  -  "MOST" Form in Place?  -           Consults cardiology  DVT Prophylaxis  Lovenox   Lab Results  Component Value Date   PLT 88 (L) 09/24/2017     Time Spent in minutes   35 minutes greater than 50% of time spent in care coordination and counseling patient regarding the condition and plan of care.   Dustin Flock M.D on 09/24/2017 at 2:05 PM  Between 7am to 6pm - Pager - [phone]  After 6pm go to [email or web address] - Proofreader  [employer]   Office  [phone]

## 2017-09-24 NOTE — Progress Notes (Signed)
[place] for warfarin dosing Indication: atrial fibrillation  Allergies  Allergen Reactions  . Cefdinir Other (See Comments)    nightmares  . Adhesive [Tape] Rash  . Sulfa Antibiotics Rash  . Sulfamethoxazole Hives, Itching and Rash    Patient Measurements: Height: 5\' 10"  (177.8 cm) Weight: 161 lb 8 oz (73.3 kg) IBW/kg (Calculated) : 73  Vital Signs: Temp: 97.9 F (36.6 C) (08/12 0800) Temp Source: Oral (08/12 0800) BP: 109/64 (08/12 0800) Pulse Rate: 78 (08/12 0800)  Labs: Recent Labs    09/23/17 1518 09/23/17 1748 09/24/17 0417  HGB 14.2  --  12.4*  HCT 41.5  --  36.2*  PLT 106*  --  88*  LABPROT 19.8*  --  22.6*  INR 1.70  --  2.01  CREATININE 0.98  --  1.05  TROPONINI <0.03 <0.03  --     Estimated Creatinine Clearance: 57.9 mL/min (by C-G formula based on SCr of 1.05 mg/dL).   Medical History: Past Medical History:  Diagnosis Date  . AAA (abdominal aortic aneurysm) ([place])   . Arrhythmia    atrial fibrillation  . Arthritis   . BPH (benign prostatic hyperplasia)   . Cancer ([place])    skin  . CHF (congestive heart failure) ([place])   . Coronary artery disease   . DDD (degenerative disc disease), thoracic   . Diabetes mellitus without complication ([place])   . Esophageal stricture   . GERD (gastroesophageal reflux disease)   . Heart murmur   . Hyperlipidemia   . Hypertension   . Hypothyroidism   . Myocardial infarction ([place])   . Shortness of breath dyspnea     Medications:  Scheduled:  . aspirin EC  81 mg Oral Daily  . enalapril  5 mg Oral BID  . fluticasone  2 spray Each Nare Daily  . furosemide  20 mg Intravenous Q12H  . gabapentin  300 mg Oral QHS  . glipiZIDE  5 mg Oral BH-q7a  . insulin aspart  0-5 Units Subcutaneous QHS  . insulin aspart  0-9 Units Subcutaneous TID WC  . levothyroxine  112 mcg Oral QAC breakfast  . metFORMIN  500 mg Oral BID WC  . metoprolol succinate  25 mg Oral Daily  . pantoprazole   40 mg Oral Daily  . simvastatin  40 mg Oral QPM  . sodium chloride flush  3 mL Intravenous Q12H  . sotalol  120 mg Oral BID  . tamsulosin  0.4 mg Oral q morning - 10a  . temazepam  30 mg Oral QHS  . cyanocobalamin  1,000 mcg Oral Daily  . warfarin  5 mg Oral ONCE-1800  . Warfarin - Pharmacist Dosing Inpatient   Does not apply q1800    Assessment: 82 y.o. male with a known history of chronic atrial fibrillation, chronic systolic CHF, CAD, diabetes, hypertension, hyperlipidemia, GERD, hypothyroidism, BPH who presents to the hospital due to shortness of breath. He is on 4mg  of warfarin daily at home but admitting INR is subtherapeutic. There are no new significant DDIs   8/11  INR 1.70    Warfarin 6mg  8/12  INR 2.01  Goal of Therapy:  Monitor platelets by anticoagulation protocol: Yes   Plan:  Warfarin 5mg  po once tonight and adjust based on INR in the morning  Paulina Fusi, PharmD, BCPS 09/24/2017 12:54 PM

## 2017-09-25 DIAGNOSIS — Z23 Encounter for immunization: Secondary | ICD-10-CM | POA: Diagnosis not present

## 2017-09-25 LAB — GLUCOSE, CAPILLARY
GLUCOSE-CAPILLARY: 87 mg/dL (ref 70–99)
Glucose-Capillary: 186 mg/dL — ABNORMAL HIGH (ref 70–99)

## 2017-09-25 LAB — PROTIME-INR
INR: 2.56
PROTHROMBIN TIME: 27.3 s — AB (ref 11.4–15.2)

## 2017-09-25 MED ORDER — FUROSEMIDE 20 MG PO TABS: 20.0000 mg | ORAL_TABLET | Freq: Two times a day (BID) | ORAL | 1 refills | Status: DC

## 2017-09-25 NOTE — Progress Notes (Signed)
[place] for warfarin dosing Indication: atrial fibrillation  Allergies  Allergen Reactions  . Cefdinir Other (See Comments)    nightmares  . Adhesive [Tape] Rash  . Sulfa Antibiotics Rash  . Sulfamethoxazole Hives, Itching and Rash    Patient Measurements: Height: 5\' 10"  (177.8 cm) Weight: 158 lb 3.2 oz (71.8 kg) IBW/kg (Calculated) : 73  Vital Signs: Temp: 98.2 F (36.8 C) (08/13 0804) Temp Source: Oral (08/13 0804) BP: 101/55 (08/13 0804) Pulse Rate: 65 (08/13 0804)  Labs: Recent Labs    09/23/17 1518 09/23/17 1748 09/24/17 0417 09/25/17 0444  HGB 14.2  --  12.4*  --   HCT 41.5  --  36.2*  --   PLT 106*  --  88*  --   LABPROT 19.8*  --  22.6* 27.3*  INR 1.70  --  2.01 2.56  CREATININE 0.98  --  1.05  --   TROPONINI <0.03 <0.03  --   --     Estimated Creatinine Clearance: 57 mL/min (by C-G formula based on SCr of 1.05 mg/dL).   Medical History: Past Medical History:  Diagnosis Date  . AAA (abdominal aortic aneurysm) ([place])   . Arrhythmia    atrial fibrillation  . Arthritis   . BPH (benign prostatic hyperplasia)   . Cancer ([place])    skin  . CHF (congestive heart failure) ([place])   . Coronary artery disease   . DDD (degenerative disc disease), thoracic   . Diabetes mellitus without complication ([place])   . Esophageal stricture   . GERD (gastroesophageal reflux disease)   . Heart murmur   . Hyperlipidemia   . Hypertension   . Hypothyroidism   . Myocardial infarction ([place])   . Shortness of breath dyspnea     Medications:  Scheduled:  . aspirin EC  81 mg Oral Daily  . enalapril  5 mg Oral BID  . fluticasone  2 spray Each Nare Daily  . furosemide  20 mg Intravenous Q12H  . gabapentin  300 mg Oral QHS  . glipiZIDE  5 mg Oral BH-q7a  . insulin aspart  0-5 Units Subcutaneous QHS  . insulin aspart  0-9 Units Subcutaneous TID WC  . levothyroxine  112 mcg Oral QAC breakfast  . metFORMIN  500 mg Oral BID WC  .  metoprolol succinate  25 mg Oral Daily  . pantoprazole  40 mg Oral Daily  . simvastatin  40 mg Oral QPM  . sodium chloride flush  3 mL Intravenous Q12H  . sotalol  120 mg Oral BID  . tamsulosin  0.4 mg Oral q morning - 10a  . temazepam  30 mg Oral QHS  . cyanocobalamin  1,000 mcg Oral Daily  . Warfarin - Pharmacist Dosing Inpatient   Does not apply q1800    Assessment: 82 y.o. male with a known history of chronic atrial fibrillation, chronic systolic CHF, CAD, diabetes, hypertension, hyperlipidemia, GERD, hypothyroidism, BPH who presents to the hospital due to shortness of breath. He is on 4mg  of warfarin daily at home but admitting INR is subtherapeutic. There are no new significant DDIs   8/11  INR 1.70    Warfarin 6mg  8/12  INR 2.01    Warfarin 5mg  8/13  INR 2.56  Goal of Therapy:  Monitor platelets by anticoagulation protocol: Yes   Plan:  Will resume home dose of Warfarin 4mg . Patient will be discharged home today on home dose.  Paulina Fusi, PharmD, BCPS 09/25/2017 10:31 AM

## 2017-09-25 NOTE — Plan of Care (Signed)
  Problem: Clinical Measurements: Goal: Diagnostic test results will improve Outcome: Progressing Note:  PT 27.3/2.56 Goal: Respiratory complications will improve Outcome: Progressing Note:  Pt on RA now   Problem: Education: Goal: Individualized Educational Video(s) Outcome: Completed/Met Note:  Pt has viewed the 4 required videos in EMMI

## 2017-09-25 NOTE — Progress Notes (Signed)
Pt discharged to home via wc.  Instructions  given to pt.  Questions answered.  No distress.  

## 2017-09-25 NOTE — Progress Notes (Signed)
[facility] Encounter Note  Patient: Joline Maxcy / Admit Date: 09/23/2017 / Date of Encounter: 09/25/2017, 8:32 AM   Subjective: Significant improvement since hospitalization and admission.  Much less shortness of breath and edema.  No evidence of chest discomfort with ambulation yesterday and today.  The patient has had no concerns of myocardial infarction at this time.  Atrial fibrillation is continued but with good heart rate control  Review of Systems: Positive for: Shortness of breath Negative for: Vision change, hearing change, syncope, dizziness, nausea, vomiting,diarrhea, bloody stool, stomach pain, cough, congestion, diaphoresis, urinary frequency, urinary pain,skin lesions, skin rashes Others previously listed  Objective: Telemetry: Atrial fibrillation with bundle branch block and occasional pacemaker back-up Physical Exam: Blood pressure (!) 101/55, pulse 65, temperature 98.2 F (36.8 C), temperature source Oral, resp. rate 15, height 5\' 10"  (1.778 m), weight 71.8 kg, SpO2 94 %. Body mass index is 22.7 kg/m. General: Well developed, well nourished, in no acute distress. Head: Normocephalic, atraumatic, sclera non-icteric, no xanthomas, nares are without discharge. Neck: No apparent masses Lungs: Normal respirations with no wheezes, no rhonchi, no rales , no crackles   Heart: Irregular rate and rhythm, normal S1 S2, 2-3+ right upper sternal border murmur, no rub, no gallop, PMI is normal size and placement, carotid upstroke normal with  bruit, jugular venous pressure normal Abdomen: Soft, non-tender, non-distended with normoactive bowel sounds. No hepatosplenomegaly. Abdominal aorta is normal size without bruit Extremities: Trace edema, no clubbing, no cyanosis, no ulcers,  Peripheral: 2+ radial, 2+ femoral, 2+ dorsal pedal pulses Neuro: Alert and oriented. Moves all extremities spontaneously. Psych:  Responds to questions appropriately with a normal  affect.   Intake/Output Summary (Last 24 hours) at 09/25/2017 0832 Last data filed at 09/25/2017 0809 Gross per 24 hour  Intake 600 ml  Output 2150 ml  Net -1550 ml    Inpatient Medications:  . aspirin EC  81 mg Oral Daily  . enalapril  5 mg Oral BID  . fluticasone  2 spray Each Nare Daily  . furosemide  20 mg Intravenous Q12H  . gabapentin  300 mg Oral QHS  . glipiZIDE  5 mg Oral BH-q7a  . insulin aspart  0-5 Units Subcutaneous QHS  . insulin aspart  0-9 Units Subcutaneous TID WC  . levothyroxine  112 mcg Oral QAC breakfast  . metFORMIN  500 mg Oral BID WC  . metoprolol succinate  25 mg Oral Daily  . pantoprazole  40 mg Oral Daily  . simvastatin  40 mg Oral QPM  . sodium chloride flush  3 mL Intravenous Q12H  . sotalol  120 mg Oral BID  . tamsulosin  0.4 mg Oral q morning - 10a  . temazepam  30 mg Oral QHS  . cyanocobalamin  1,000 mcg Oral Daily  . Warfarin - Pharmacist Dosing Inpatient   Does not apply q1800   Infusions:   Labs: Recent Labs    09/23/17 1518 09/24/17 0417  NA 141 142  K 4.5 4.1  CL 104 106  CO2 26 30  GLUCOSE 121* 94  BUN 18 19  CREATININE 0.98 1.05  CALCIUM 8.6* 8.6*   No results for input(s): AST, ALT, ALKPHOS, BILITOT, PROT, ALBUMIN in the last 72 hours. Recent Labs    09/23/17 1518 09/24/17 0417  WBC 7.0 6.1  HGB 14.2 12.4*  HCT 41.5 36.2*  MCV 99.5 99.1  PLT 106* 88*   Recent Labs    09/23/17 1518 09/23/17 1748  TROPONINI <  0.03 <0.03   Invalid input(s): POCBNP No results for input(s): HGBA1C in the last 72 hours.   Weights: Filed Weights   09/23/17 2016 09/24/17 0318 09/25/17 0359  Weight: 72.9 kg 73.3 kg 71.8 kg     Radiology/Studies:  Dg Chest 2 View  Result Date: 09/23/2017 CLINICAL DATA:  Worsening shortness of breath over the past few weeks. Weakness for the past week. EXAM: CHEST - 2 VIEW COMPARISON:  11/28/2015. FINDINGS: Mildly progressive enlargement of the cardiac silhouette and mild increase in prominence of  the pulmonary vasculature. Mild increase in linear density at both lung bases. Mild peribronchial thickening. Mild increase in size of a small partially loculated left pleural effusion. Stable small right pleural effusion. Thoracic spine degenerative changes. Aortic stent valve. Post CABG changes. Stable left subclavian pacemaker leads. IMPRESSION: 1. Mildly progressive cardiomegaly and pulmonary vascular congestion. 2. Mildly increased size of a small left pleural effusion and stable small right pleural effusion. 3. Increased bibasilar linear atelectasis. Electronically Signed   By: Claudie Revering M.D.   On: 09/23/2017 15:37     Assessment and Recommendation  82 y.o. male with known coronary artery disease status post coronary artery bypass graft aortic valve replacement chronic atrial fibrillation status post pacemaker placement essential hypertension mixed hyperlipidemia peripheral vascular disease with acute on chronic systolic dysfunction congestive heart failure significantly improved without evidence of myocardial infarction 1.  Continue medication management for cardiomyopathy and LV systolic dysfunction including ACE inhibitor beta-blocker 2.  Continue anticoagulation for further risk reduction in stroke with atrial fibrillation without change today 3.  Possible discontinuation of sotalol as outpatient due to chronic atrial fibrillation 4.  Continue furosemide for congestive heart failure with change to oral today 5.  Begin ambulation and follow for improvements of symptoms and possible discharged home with follow-up next week with change in medication management and other diagnostic testing as necessary  Signed, Serafina Royals M.D. FACC

## 2017-09-25 NOTE — Care Management (Signed)
Chart reviewed.  No new medications at discharge. Patient on RA.  RNCM spoke with bedside RN who indicated there were no needs at discharge.  Please consult RNCM if indicated.

## 2017-09-25 NOTE — Discharge Summary (Signed)
Derrick Cochran at [facility], 82 y.o., DOB 1935-07-11, MRN [id number]. Admission date: 09/23/2017 Discharge Date 09/25/2017 Primary MD [name], MD Admitting Physician Henreitta Leber, MD  Admission Diagnosis  Acute respiratory failure with hypoxia [facility]) [J96.01] Acute on chronic congestive heart failure, unspecified heart failure type [facility]) [I50.9]  Discharge Diagnosis   Active Problems: Acute on chronic systolic CHF Chronic atrial fibrillation continue Coumadin Essential hypertension GERD Hyperlipidemia BPH Neuropathy Diabetes type Vertigo  Hospital Course  82 year old male with past medical history of chronic atrial fibrillation, chronic systolic CHF, BPH, hypertension, hyperlipidemia, hypothyroidism, GERD who presents to the hospital due to shortness of breath.  He was noted to have acute on chronic systolic CHF.  He was treated with IV Lasix.  Patient diuresed well with Lasix.  Patient doing better breathing improved.  He is stable for discharge.            Consults  cardiology  Significant Tests:  See full reports for all details     Dg Chest 2 View  Result Date: 09/23/2017 CLINICAL DATA:  Worsening shortness of breath over the past few weeks. Weakness for the past week. EXAM: CHEST - 2 VIEW COMPARISON:  11/28/2015. FINDINGS: Mildly progressive enlargement of the cardiac silhouette and mild increase in prominence of the pulmonary vasculature. Mild increase in linear density at both lung bases. Mild peribronchial thickening. Mild increase in size of a small partially loculated left pleural effusion. Stable small right pleural effusion. Thoracic spine degenerative changes. Aortic stent valve. Post CABG changes. Stable left subclavian pacemaker leads. IMPRESSION: 1. Mildly progressive cardiomegaly and pulmonary vascular congestion. 2. Mildly increased size of a small left pleural effusion and stable small right pleural effusion.  3. Increased bibasilar linear atelectasis. Electronically Signed   By: Claudie Revering M.D.   On: 09/23/2017 15:37       Today   Subjective:   [name] patient doing much better shortness of breath resolved Objective:   Blood pressure (!) 101/55, pulse 65, temperature 98.2 F (36.8 C), temperature source Oral, resp. rate 15, height 5\' 10"  (1.778 m), weight 71.8 kg, SpO2 94 %.  .  Intake/Output Summary (Last 24 hours) at 09/25/2017 1325 Last data filed at 09/25/2017 1013 Gross per 24 hour  Intake 603 ml  Output 1450 ml  Net -847 ml    Exam VITAL SIGNS: Blood pressure (!) 101/55, pulse 65, temperature 98.2 F (36.8 C), temperature source Oral, resp. rate 15, height 5\' 10"  (1.778 m), weight 71.8 kg, SpO2 94 %.  GENERAL:  82 y.o.-year-old patient lying in the bed with no acute distress.  EYES: Pupils equal, round, reactive to light and accommodation. No scleral icterus. Extraocular muscles intact.  HEENT: Head atraumatic, normocephalic. Oropharynx and nasopharynx clear.  NECK:  Supple, no jugular venous distention. No thyroid enlargement, no tenderness.  LUNGS: Normal breath sounds bilaterally, no wheezing, rales,rhonchi or crepitation. No use of accessory muscles of respiration.  CARDIOVASCULAR: S1, S2 normal. No murmurs, rubs, or gallops.  ABDOMEN: Soft, nontender, nondistended. Bowel sounds present. No organomegaly or mass.  EXTREMITIES: No pedal edema, cyanosis, or clubbing.  NEUROLOGIC: Cranial nerves II through XII are intact. Muscle strength 5/5 in all extremities. Sensation intact. Gait not checked.  PSYCHIATRIC: The patient is alert and oriented x 3.  SKIN: No obvious rash, lesion, or ulcer.   Data Review     CBC w Diff:  Lab Results  Component Value Date   WBC 6.1  09/24/2017   HGB 12.4 (L) 09/24/2017   HGB 12.5 (L) 05/19/2014   HCT 36.2 (L) 09/24/2017   HCT 37.3 (L) 05/19/2014   PLT 88 (L) 09/24/2017   PLT 81 (L) 05/19/2014   LYMPHOPCT 19% 04/09/2015    LYMPHOPCT 21.2 05/19/2014   MONOPCT 10% 04/09/2015   MONOPCT 8.4 05/19/2014   EOSPCT 4% 04/09/2015   EOSPCT 3.2 05/19/2014   BASOPCT 1% 04/09/2015   BASOPCT 0.5 05/19/2014   CMP:  Lab Results  Component Value Date   NA 142 09/24/2017   NA 136 05/19/2014   K 4.1 09/24/2017   K 4.6 05/19/2014   CL 106 09/24/2017   CL 100 (L) 05/19/2014   CO2 30 09/24/2017   CO2 29 05/19/2014   BUN 19 09/24/2017   BUN 34 (H) 05/19/2014   CREATININE 1.05 09/24/2017   CREATININE 1.32 (H) 05/19/2014   PROT 7.6 04/11/2015   ALBUMIN 3.6 04/11/2015   BILITOT 1.1 04/11/2015   ALKPHOS 55 04/11/2015   AST 37 04/11/2015   ALT 35 04/11/2015  .  Micro Results No results found for this or any previous visit (from the past 240 hour(s)).      Code Status Orders  (From admission, onward)         Start     Ordered   09/23/17 2030  Do not attempt resuscitation (DNR)  Continuous    Question Answer Comment  In the event of cardiac or respiratory ARREST Do not call a "code blue"   In the event of cardiac or respiratory ARREST Do not perform Intubation, CPR, defibrillation or ACLS   In the event of cardiac or respiratory ARREST Use medication by any route, position, wound care, and other measures to relive pain and suffering. May use oxygen, suction and manual treatment of airway obstruction as needed for comfort.      09/23/17 2029        Code Status History    Date Active Date Inactive Code Status Order ID Comments User Context   11/28/2015 2035 11/30/2015 1650 Full Code [id number]  Theodoro Grist, MD ED   04/11/2015 1022 04/13/2015 1603 Full Code [id number]  Dustin Flock, MD ED   07/02/2014 1547 07/04/2014 1352 Full Code [id number]  [name], MD Inpatient    Advance Directive Documentation     Most Recent Value  Type of Advance Directive  Living will  Pre-existing out of facility DNR order (yellow form or pink MOST form)  -  "MOST" Form in Place?  -          Follow-up  Information    [place] Follow up on 10/01/2017.   Specialty:  Cardiology Why:  at 11:00am Contact information: [address] [place] [phone]       [name], MD On 09/27/2017.   Specialty:  Internal Medicine Why:  Appointment Time:@ 10:15 am Contact information: [place] [address] [phone]           Discharge Medications   Allergies as of 09/25/2017      Reactions   Cefdinir Other (See Comments)   nightmares   Adhesive [tape] Rash   Sulfa Antibiotics Rash   Sulfamethoxazole Hives, Itching, Rash      Medication List    TAKE these medications   acetaminophen 500 MG tablet Commonly known as:  TYLENOL Take 500 mg by mouth every 6 (six) hours as needed for  mild pain, moderate pain or fever.   aspirin 81 MG EC tablet Take 1 tablet (81 mg total) by mouth daily.   cyanocobalamin 1000 MCG tablet Take 1,000 mcg by mouth as needed.   dapagliflozin propanediol 10 MG Tabs tablet Commonly known as:  FARXIGA Take 10 mg by mouth daily. Study drug   enalapril 5 MG tablet Commonly known as:  VASOTEC Take 1 tablet (5 mg total) by mouth daily. What changed:  when to take this   furosemide 20 MG tablet Commonly known as:  LASIX Take 1 tablet (20 mg total) by mouth 2 (two) times daily. What changed:  when to take this   gabapentin 300 MG capsule Commonly known as:  NEURONTIN Take 300 mg by mouth at bedtime.   glipiZIDE 5 MG 24 hr tablet Commonly known as:  GLUCOTROL XL Take 5 mg by mouth every morning.   levothyroxine 112 MCG tablet Commonly known as:  SYNTHROID, LEVOTHROID Take 112 mcg by mouth daily before breakfast.   loratadine 10 MG tablet Commonly known as:  CLARITIN Take 1 tablet by mouth daily.   metFORMIN 500 MG tablet Commonly known as:  GLUCOPHAGE Take 500 mg by mouth 2 (two) times daily.   metoprolol succinate 25 MG 24 hr  tablet Commonly known as:  TOPROL-XL Take 25 mg by mouth daily.   nitroGLYCERIN 0.4 MG SL tablet Commonly known as:  NITROSTAT Place 0.4 mg under the tongue every 5 (five) minutes as needed for chest pain.   omeprazole 20 MG capsule Commonly known as:  PRILOSEC Take 20 mg by mouth every morning.   simvastatin 40 MG tablet Commonly known as:  ZOCOR Take 40 mg by mouth every evening.   sotalol 120 MG tablet Commonly known as:  BETAPACE Take 1 tablet (120 mg total) by mouth 2 (two) times daily.   tamsulosin 0.4 MG Caps capsule Commonly known as:  FLOMAX Take 0.4 mg by mouth every morning.   temazepam 15 MG capsule Commonly known as:  RESTORIL Take 30 mg by mouth at bedtime.   warfarin 3 MG tablet Commonly known as:  COUMADIN Take 3 mg by mouth every evening. Pt takes with 1mg  tablet for a total dose of 4mg .   warfarin 1 MG tablet Commonly known as:  COUMADIN Take 1 mg by mouth every evening. Pt takes with 3mg  tablet for a total dose of 4mg .          Total Time in preparing paper work, data evaluation and todays exam - 23 minutes  Dustin Flock M.D on 09/25/2017 at 1:25 PM [place]  [phone]

## 2017-09-27 DIAGNOSIS — I482 Chronic atrial fibrillation: Secondary | ICD-10-CM | POA: Diagnosis not present

## 2017-09-27 DIAGNOSIS — I350 Nonrheumatic aortic (valve) stenosis: Secondary | ICD-10-CM | POA: Diagnosis not present

## 2017-09-27 DIAGNOSIS — I5022 Chronic systolic (congestive) heart failure: Secondary | ICD-10-CM | POA: Diagnosis not present

## 2017-09-27 DIAGNOSIS — Z79899 Other long term (current) drug therapy: Secondary | ICD-10-CM | POA: Diagnosis not present

## 2017-09-27 DIAGNOSIS — I4891 Unspecified atrial fibrillation: Secondary | ICD-10-CM | POA: Diagnosis not present

## 2017-09-27 DIAGNOSIS — I495 Sick sinus syndrome: Secondary | ICD-10-CM | POA: Diagnosis not present

## 2017-09-27 DIAGNOSIS — I255 Ischemic cardiomyopathy: Secondary | ICD-10-CM | POA: Diagnosis not present

## 2017-09-27 DIAGNOSIS — I214 Non-ST elevation (NSTEMI) myocardial infarction: Secondary | ICD-10-CM | POA: Diagnosis not present

## 2017-09-27 DIAGNOSIS — I2119 ST elevation (STEMI) myocardial infarction involving other coronary artery of inferior wall: Secondary | ICD-10-CM | POA: Diagnosis not present

## 2017-09-27 DIAGNOSIS — I509 Heart failure, unspecified: Secondary | ICD-10-CM | POA: Diagnosis not present

## 2017-09-27 DIAGNOSIS — I10 Essential (primary) hypertension: Secondary | ICD-10-CM | POA: Diagnosis not present

## 2017-10-01 ENCOUNTER — Encounter: Payer: Self-pay | Admitting: Family

## 2017-10-01 ENCOUNTER — Ambulatory Visit: Payer: Medicare HMO | Attending: Family | Admitting: Family

## 2017-10-01 VITALS — BP 120/72 | HR 83 | Resp 18 | Ht 70.0 in | Wt 159.4 lb

## 2017-10-01 DIAGNOSIS — Z87891 Personal history of nicotine dependence: Secondary | ICD-10-CM | POA: Diagnosis not present

## 2017-10-01 DIAGNOSIS — I5022 Chronic systolic (congestive) heart failure: Secondary | ICD-10-CM

## 2017-10-01 DIAGNOSIS — E119 Type 2 diabetes mellitus without complications: Secondary | ICD-10-CM | POA: Insufficient documentation

## 2017-10-01 DIAGNOSIS — Z7901 Long term (current) use of anticoagulants: Secondary | ICD-10-CM | POA: Diagnosis not present

## 2017-10-01 DIAGNOSIS — I2510 Atherosclerotic heart disease of native coronary artery without angina pectoris: Secondary | ICD-10-CM | POA: Insufficient documentation

## 2017-10-01 DIAGNOSIS — Z79899 Other long term (current) drug therapy: Secondary | ICD-10-CM | POA: Diagnosis not present

## 2017-10-01 DIAGNOSIS — R0602 Shortness of breath: Secondary | ICD-10-CM | POA: Insufficient documentation

## 2017-10-01 DIAGNOSIS — E039 Hypothyroidism, unspecified: Secondary | ICD-10-CM | POA: Insufficient documentation

## 2017-10-01 DIAGNOSIS — Z7982 Long term (current) use of aspirin: Secondary | ICD-10-CM | POA: Diagnosis not present

## 2017-10-01 DIAGNOSIS — K219 Gastro-esophageal reflux disease without esophagitis: Secondary | ICD-10-CM | POA: Insufficient documentation

## 2017-10-01 DIAGNOSIS — N400 Enlarged prostate without lower urinary tract symptoms: Secondary | ICD-10-CM | POA: Insufficient documentation

## 2017-10-01 DIAGNOSIS — I4891 Unspecified atrial fibrillation: Secondary | ICD-10-CM | POA: Diagnosis not present

## 2017-10-01 DIAGNOSIS — Z882 Allergy status to sulfonamides status: Secondary | ICD-10-CM | POA: Diagnosis not present

## 2017-10-01 DIAGNOSIS — I110 Hypertensive heart disease with heart failure: Secondary | ICD-10-CM | POA: Diagnosis not present

## 2017-10-01 DIAGNOSIS — Z951 Presence of aortocoronary bypass graft: Secondary | ICD-10-CM | POA: Diagnosis not present

## 2017-10-01 DIAGNOSIS — I714 Abdominal aortic aneurysm, without rupture: Secondary | ICD-10-CM | POA: Diagnosis not present

## 2017-10-01 DIAGNOSIS — Z881 Allergy status to other antibiotic agents status: Secondary | ICD-10-CM | POA: Diagnosis not present

## 2017-10-01 DIAGNOSIS — I252 Old myocardial infarction: Secondary | ICD-10-CM | POA: Insufficient documentation

## 2017-10-01 DIAGNOSIS — E785 Hyperlipidemia, unspecified: Secondary | ICD-10-CM | POA: Diagnosis not present

## 2017-10-01 DIAGNOSIS — Z7984 Long term (current) use of oral hypoglycemic drugs: Secondary | ICD-10-CM | POA: Insufficient documentation

## 2017-10-01 DIAGNOSIS — I10 Essential (primary) hypertension: Secondary | ICD-10-CM

## 2017-10-01 NOTE — Progress Notes (Signed)
Patient ID: Derrick Cochran, male    DOB: Jun 27, 1935, 82 y.o.   MRN: [id number]  HPI  [name] is a 82 y/o male with a history of MI, hypothyroidism, HTN, hyperlipidemia, GERD, DM, BPH, atrial fibrillation, AAA, remote tobacco use and chronic heart failure.   Echo report from 07/26/17 reviewed and showed an EF of 20% along with moderate MR/TR. Echo report from 12/06/16 reviewed and showed an EF of 20% along with mild AR and moderate MR/TR. Echo done 11/29/15 which showed an EF of 30-35% with bioprosthesis valve present. Also has mild MR. This is an improvement from his EF of 20-25% on 04/11/15. Last catheterization was done 12/04/13. CABG done 1995.  Admitted 09/23/17 due to acute on chronic HF. Cardiology consult obtained. Initially needed IV lasix and was discharged after 2 days.   He presents today for a follow-up visit with a chief complaint of minimal shortness of breath upon moderate exertion. He describes this as chronic in nature having been present for several years. He does feel like his breathing has improved since his recent admission. He has associated fatigue, light-headedness and easy bruising along with this. He denies any difficulty sleeping, abdominal distention, palpitations, pedal edema, chest pain, cough or weight gain. Going to see ENT next week regarding his inner ear.   Past Medical History:  Diagnosis Date  . AAA (abdominal aortic aneurysm) ([place])   . Arrhythmia    atrial fibrillation  . Arthritis   . BPH (benign prostatic hyperplasia)   . Cancer ([place])    skin  . CHF (congestive heart failure) ([place])   . Coronary artery disease   . DDD (degenerative disc disease), thoracic   . Diabetes mellitus without complication ([place])   . Esophageal stricture   . GERD (gastroesophageal reflux disease)   . Heart murmur   . Hyperlipidemia   . Hypertension   . Hypothyroidism   . Myocardial infarction ([place])   . Shortness of breath dyspnea     Past Surgical History:  Procedure  Laterality Date  . AORTIC VALVE REPLACEMENT  1995   Bioprosthetic - [place]  . APPENDECTOMY    . CARDIAC CATHETERIZATION  1994 x 2 with PCTA of RCA  . CARDIAC VALVE REPLACEMENT     Bovine transcatheter heart valve  . COLONOSCOPY  2015  . CORONARY ARTERY BYPASS GRAFT  1995   x 4 Vessels - [facility]  . ELECTROPHYSIOLOGIC STUDY N/A 08/06/2014   Procedure: Cardioversion;  Surgeon: [name], MD;  Location: [facility];  Service: Cardiovascular;  Laterality: N/A;  . ELECTROPHYSIOLOGIC STUDY N/A 06/18/2014   Procedure: CARDIOVERSION;  Surgeon: [name], MD;  Location: [facility];  Service: Cardiovascular;  Laterality: N/A;  . ESOPHAGOGASTRODUODENOSCOPY  2014  . GREAT TOE ARTHRODESIS, INTERPHALANGEAL JOINT Left   . HERNIA REPAIR Left    Inguinal  . PACEMAKER INSERTION N/A 07/02/2014   Procedure: INSERTION PACEMAKER/DUAL CHAMBER  INITIAL IMPLANT;  Surgeon: [name], MD;  Location: [facility];  Service: Cardiovascular;  Laterality: N/A;  . SKIN BIOPSY      Family History  Problem Relation Age of Onset  . Cancer Mother   . Lung cancer Father   . Kidney disease Sister   . Heart failure Brother     Social History   Tobacco Use  . Smoking status: Former Smoker    Packs/day: 0.50    Years: 15.00    Pack years: 7.50    Types: Cigarettes    Last attempt to  quit: 04/22/1985    Years since quitting: 32.4  . Smokeless tobacco: Former Systems developer    Types: Chew  Substance Use Topics  . Alcohol use: No    Allergies  Allergen Reactions  . Cefdinir Other (See Comments)    nightmares  . Adhesive [Tape] Rash  . Sulfa Antibiotics Rash  . Sulfamethoxazole Hives, Itching and Rash   Past Medical History:  Diagnosis Date  . AAA (abdominal aortic aneurysm) ([place])   . Arrhythmia    atrial fibrillation  . Arthritis   . BPH (benign prostatic hyperplasia)   . Cancer ([place])    skin  . CHF (congestive heart failure) ([place])   . Coronary artery disease   . DDD (degenerative  disc disease), thoracic   . Diabetes mellitus without complication ([place])   . Esophageal stricture   . GERD (gastroesophageal reflux disease)   . Heart murmur   . Hyperlipidemia   . Hypertension   . Hypothyroidism   . Myocardial infarction ([place])   . Shortness of breath dyspnea    Past Surgical History:  Procedure Laterality Date  . AORTIC VALVE REPLACEMENT  1995   Bioprosthetic - [place]  . APPENDECTOMY    . CARDIAC CATHETERIZATION  1994 x 2 with PCTA of RCA  . CARDIAC VALVE REPLACEMENT     Bovine transcatheter heart valve  . COLONOSCOPY  2015  . CORONARY ARTERY BYPASS GRAFT  1995   x 4 Vessels - [facility]  . ELECTROPHYSIOLOGIC STUDY N/A 08/06/2014   Procedure: Cardioversion;  Surgeon: [name], MD;  Location: [place];  Service: Cardiovascular;  Laterality: N/A;  . ELECTROPHYSIOLOGIC STUDY N/A 06/18/2014   Procedure: CARDIOVERSION;  Surgeon: [name], MD;  Location: [facility];  Service: Cardiovascular;  Laterality: N/A;  . ESOPHAGOGASTRODUODENOSCOPY  2014  . GREAT TOE ARTHRODESIS, INTERPHALANGEAL JOINT Left   . HERNIA REPAIR Left    Inguinal  . PACEMAKER INSERTION N/A 07/02/2014   Procedure: INSERTION PACEMAKER/DUAL CHAMBER  INITIAL IMPLANT;  Surgeon: [name], MD;  Location: [facility];  Service: Cardiovascular;  Laterality: N/A;  . SKIN BIOPSY     Family History  Problem Relation Age of Onset  . Cancer Mother   . Lung cancer Father   . Kidney disease Sister   . Heart failure Brother    Social History   Tobacco Use  . Smoking status: Former Smoker    Packs/day: 0.50    Years: 15.00    Pack years: 7.50    Types: Cigarettes    Last attempt to quit: 04/22/1985    Years since quitting: 32.4  . Smokeless tobacco: Former Systems developer    Types: Chew  Substance Use Topics  . Alcohol use: No   Allergies  Allergen Reactions  . Cefdinir Other (See Comments)    nightmares  . Adhesive [Tape] Rash  . Sulfa Antibiotics Rash  . Sulfamethoxazole  Hives, Itching and Rash   Prior to Admission medications   Medication Sig Start Date End Date Taking? Authorizing Provider  acetaminophen (TYLENOL) 500 MG tablet Take 500 mg by mouth every 6 (six) hours as needed for mild pain, moderate pain or fever.    Yes [provider]  cyanocobalamin (,VITAMIN B-12,) 1000 MCG/ML injection Inject 1,000 mcg into the muscle every 30 (thirty) days. 09/27/17  Yes [provider]  dapagliflozin propanediol (FARXIGA) 10 MG TABS tablet Take 10 mg by mouth daily. Study drug   Yes [provider]  enalapril (VASOTEC) 5 MG tablet Take 1 tablet (  5 mg total) by mouth daily. Patient taking differently: Take 5 mg by mouth 2 (two) times daily.  11/30/15  Yes Mody, Ulice Bold, MD  furosemide (LASIX) 20 MG tablet Take 1 tablet (20 mg total) by mouth 2 (two) times daily. 09/25/17  Yes Dustin Flock, MD  gabapentin (NEURONTIN) 300 MG capsule Take 300 mg by mouth at bedtime.   Yes [provider]  glipiZIDE (GLUCOTROL XL) 5 MG 24 hr tablet Take 5 mg by mouth every morning. 01/21/15  Yes [provider]  levothyroxine (SYNTHROID, LEVOTHROID) 112 MCG tablet Take 112 mcg by mouth daily before breakfast.   Yes [provider]  loratadine (CLARITIN) 10 MG tablet Take 1 tablet by mouth daily. 05/21/15 10/01/17 Yes [provider]  metFORMIN (GLUCOPHAGE) 500 MG tablet Take 500 mg by mouth 2 (two) times daily.    Yes [provider]  metoprolol succinate (TOPROL-XL) 25 MG 24 hr tablet Take 25 mg by mouth daily.   Yes [provider]  nitroGLYCERIN (NITROSTAT) 0.4 MG SL tablet Place 0.4 mg under the tongue every 5 (five) minutes as needed for chest pain.   Yes [provider]  omeprazole (PRILOSEC) 20 MG capsule Take 20 mg by mouth every morning.    Yes [provider]  simvastatin (ZOCOR) 40 MG tablet Take 40 mg by mouth every evening.    Yes [provider]  sotalol (BETAPACE) 120 MG  tablet Take 1 tablet (120 mg total) by mouth 2 (two) times daily. 11/30/15  Yes Mody, Ulice Bold, MD  tamsulosin (FLOMAX) 0.4 MG CAPS capsule Take 0.4 mg by mouth every morning.    Yes [provider]  temazepam (RESTORIL) 30 MG capsule Take 30 mg by mouth at bedtime.    Yes [provider]  warfarin (COUMADIN) 1 MG tablet Take 1 mg by mouth every evening. Pt takes with 3mg  tablet for a total dose of 4mg .   Yes [provider]  warfarin (COUMADIN) 3 MG tablet Take 3 mg by mouth every evening. Pt takes with 1mg  tablet for a total dose of 4mg .   Yes [provider]  aspirin EC 81 MG EC tablet Take 1 tablet (81 mg total) by mouth daily. Patient not taking: Reported on 10/01/2017 04/13/15   Dustin Flock, MD  cyanocobalamin 1000 MCG tablet Take 1,000 mcg by mouth as needed.     [provider]    Review of Systems  Constitutional: Positive for fatigue. Negative for appetite change.  HENT: Negative for congestion, postnasal drip, rhinorrhea and sore throat.   Eyes: Negative.   Respiratory: Positive for shortness of breath (better). Negative for cough and chest tightness.   Cardiovascular: Negative for chest pain, palpitations and leg swelling.  Gastrointestinal: Negative for abdominal distention and abdominal pain.  Endocrine: Negative.   Genitourinary: Negative.   Musculoskeletal: Positive for arthralgias (right hip pain). Negative for back pain.  Skin: Negative.   Allergic/Immunologic: Negative.   Neurological: Positive for light-headedness (at times). Negative for dizziness.  Hematological: Negative for adenopathy. Bruises/bleeds easily.  Psychiatric/Behavioral: Negative for dysphoric mood and sleep disturbance (sleeping better). The patient is not nervous/anxious.    Vitals:   10/01/17 1111  BP: 120/72  Pulse: 83  Resp: 18  SpO2: 100%  Weight: 159 lb 6 oz (72.3 kg)  Height: 5\' 10"  (1.778 m)   Wt Readings from Last 3 Encounters:  10/01/17 159  lb 6 oz (72.3 kg)  09/25/17 158 lb 3.2 oz (71.8 kg)  08/31/17  167 lb 8 oz (76 kg)   Lab Results  Component Value Date   CREATININE 1.05 09/24/2017   CREATININE 0.98 09/23/2017   CREATININE 1.24 11/29/2015    Physical Exam  Constitutional: He is oriented to person, place, and time. He appears well-developed and well-nourished.  HENT:  Head: Normocephalic and atraumatic.  Neck: Normal range of motion. Neck supple.  Cardiovascular: Normal rate and regular rhythm.  Pulmonary/Chest: Effort normal. He has no wheezes. He has no rales.  Abdominal: Soft. He exhibits no distension. There is no tenderness.  Musculoskeletal: He exhibits no edema or tenderness.  Neurological: He is alert and oriented to person, place, and time.  Skin: Skin is warm and dry.  Psychiatric: He has a normal mood and affect. His behavior is normal. Thought content normal.  Nursing note and vitals reviewed.  Assessment & Plan:  1: Chronic heart failure with reduced ejection fraction- - NYHA class II - euvolemic - weighing daily at home; Reminded to call for an overnight weight gain of >2 pounds or a weekly weight gain of >5 pounds - weight down 8 pounds since he was last here ~ 1 month ago - not adding salt to his food and trying to eat low sodium foods - continue discussing entresto use with patient; on enalapril 5mg  BID - saw his cardiologist ([place]) on 09/27/17 - BNP 09/23/17 was 4061.0  2: HTN- - BP looks good today - saw PCP ([place]) on 09/27/17 - BMP on 09/27/17 reviewed and showed sodium 139, potassium 3.6 and GFR 53  3: Diabetes- - yesterday patient says his glucose was 99 - A1c on 05/30/17 was 5.7%  Patient did not bring his medications nor a list. Each medication was verbally reviewed with the patient and he was encouraged to bring the bottles to every visit to confirm accuracy of list.  Return in 6 months or sooner for any questions/problems before then.

## 2017-10-01 NOTE — Patient Instructions (Signed)
Continue weighing daily and call for an overnight weight gain of > 2 pounds or a weekly weight gain of >5 pounds. 

## 2017-10-04 DIAGNOSIS — I480 Paroxysmal atrial fibrillation: Secondary | ICD-10-CM | POA: Diagnosis not present

## 2017-10-10 DIAGNOSIS — R42 Dizziness and giddiness: Secondary | ICD-10-CM | POA: Diagnosis not present

## 2017-10-10 DIAGNOSIS — H6123 Impacted cerumen, bilateral: Secondary | ICD-10-CM | POA: Diagnosis not present

## 2017-11-01 DIAGNOSIS — I482 Chronic atrial fibrillation: Secondary | ICD-10-CM | POA: Diagnosis not present

## 2017-11-05 DIAGNOSIS — R42 Dizziness and giddiness: Secondary | ICD-10-CM | POA: Diagnosis not present

## 2017-12-06 DIAGNOSIS — I482 Chronic atrial fibrillation, unspecified: Secondary | ICD-10-CM | POA: Diagnosis not present

## 2017-12-06 DIAGNOSIS — I255 Ischemic cardiomyopathy: Secondary | ICD-10-CM | POA: Diagnosis not present

## 2017-12-19 DIAGNOSIS — E119 Type 2 diabetes mellitus without complications: Secondary | ICD-10-CM | POA: Diagnosis not present

## 2017-12-19 DIAGNOSIS — Z125 Encounter for screening for malignant neoplasm of prostate: Secondary | ICD-10-CM | POA: Diagnosis not present

## 2017-12-19 DIAGNOSIS — I482 Chronic atrial fibrillation, unspecified: Secondary | ICD-10-CM | POA: Diagnosis not present

## 2017-12-19 DIAGNOSIS — I255 Ischemic cardiomyopathy: Secondary | ICD-10-CM | POA: Diagnosis not present

## 2017-12-19 DIAGNOSIS — E039 Hypothyroidism, unspecified: Secondary | ICD-10-CM | POA: Diagnosis not present

## 2017-12-19 DIAGNOSIS — I10 Essential (primary) hypertension: Secondary | ICD-10-CM | POA: Diagnosis not present

## 2017-12-19 DIAGNOSIS — E785 Hyperlipidemia, unspecified: Secondary | ICD-10-CM | POA: Diagnosis not present

## 2017-12-19 DIAGNOSIS — Z79899 Other long term (current) drug therapy: Secondary | ICD-10-CM | POA: Diagnosis not present

## 2018-01-09 DIAGNOSIS — I482 Chronic atrial fibrillation, unspecified: Secondary | ICD-10-CM | POA: Diagnosis not present

## 2018-01-09 DIAGNOSIS — I255 Ischemic cardiomyopathy: Secondary | ICD-10-CM | POA: Diagnosis not present

## 2018-01-24 DIAGNOSIS — E119 Type 2 diabetes mellitus without complications: Secondary | ICD-10-CM | POA: Diagnosis not present

## 2018-01-24 DIAGNOSIS — E039 Hypothyroidism, unspecified: Secondary | ICD-10-CM | POA: Diagnosis not present

## 2018-01-24 DIAGNOSIS — E538 Deficiency of other specified B group vitamins: Secondary | ICD-10-CM | POA: Diagnosis not present

## 2018-01-24 DIAGNOSIS — I482 Chronic atrial fibrillation, unspecified: Secondary | ICD-10-CM | POA: Diagnosis not present

## 2018-01-24 DIAGNOSIS — Z79899 Other long term (current) drug therapy: Secondary | ICD-10-CM | POA: Diagnosis not present

## 2018-01-24 DIAGNOSIS — I10 Essential (primary) hypertension: Secondary | ICD-10-CM | POA: Diagnosis not present

## 2018-02-19 ENCOUNTER — Ambulatory Visit (INDEPENDENT_AMBULATORY_CARE_PROVIDER_SITE_OTHER): Payer: Medicare HMO | Admitting: Vascular Surgery

## 2018-02-19 ENCOUNTER — Other Ambulatory Visit (INDEPENDENT_AMBULATORY_CARE_PROVIDER_SITE_OTHER): Payer: Medicare HMO

## 2018-04-03 ENCOUNTER — Encounter: Payer: Self-pay | Admitting: Family

## 2018-04-03 ENCOUNTER — Ambulatory Visit: Payer: Medicare HMO | Attending: Family | Admitting: Family

## 2018-04-03 ENCOUNTER — Other Ambulatory Visit: Payer: Self-pay

## 2018-04-03 VITALS — BP 117/65 | HR 92 | Temp 97.7°F | Resp 16 | Ht 71.0 in | Wt 170.6 lb

## 2018-04-03 DIAGNOSIS — Z7984 Long term (current) use of oral hypoglycemic drugs: Secondary | ICD-10-CM | POA: Diagnosis not present

## 2018-04-03 DIAGNOSIS — E119 Type 2 diabetes mellitus without complications: Secondary | ICD-10-CM | POA: Insufficient documentation

## 2018-04-03 DIAGNOSIS — I2510 Atherosclerotic heart disease of native coronary artery without angina pectoris: Secondary | ICD-10-CM | POA: Insufficient documentation

## 2018-04-03 DIAGNOSIS — Z950 Presence of cardiac pacemaker: Secondary | ICD-10-CM | POA: Insufficient documentation

## 2018-04-03 DIAGNOSIS — I714 Abdominal aortic aneurysm, without rupture: Secondary | ICD-10-CM | POA: Insufficient documentation

## 2018-04-03 DIAGNOSIS — Z881 Allergy status to other antibiotic agents status: Secondary | ICD-10-CM | POA: Insufficient documentation

## 2018-04-03 DIAGNOSIS — Z8249 Family history of ischemic heart disease and other diseases of the circulatory system: Secondary | ICD-10-CM | POA: Diagnosis not present

## 2018-04-03 DIAGNOSIS — I252 Old myocardial infarction: Secondary | ICD-10-CM | POA: Diagnosis not present

## 2018-04-03 DIAGNOSIS — I110 Hypertensive heart disease with heart failure: Secondary | ICD-10-CM | POA: Insufficient documentation

## 2018-04-03 DIAGNOSIS — E039 Hypothyroidism, unspecified: Secondary | ICD-10-CM | POA: Insufficient documentation

## 2018-04-03 DIAGNOSIS — N400 Enlarged prostate without lower urinary tract symptoms: Secondary | ICD-10-CM | POA: Diagnosis not present

## 2018-04-03 DIAGNOSIS — Z951 Presence of aortocoronary bypass graft: Secondary | ICD-10-CM | POA: Diagnosis not present

## 2018-04-03 DIAGNOSIS — Z952 Presence of prosthetic heart valve: Secondary | ICD-10-CM | POA: Diagnosis not present

## 2018-04-03 DIAGNOSIS — Z882 Allergy status to sulfonamides status: Secondary | ICD-10-CM | POA: Insufficient documentation

## 2018-04-03 DIAGNOSIS — Z87891 Personal history of nicotine dependence: Secondary | ICD-10-CM | POA: Diagnosis not present

## 2018-04-03 DIAGNOSIS — Z888 Allergy status to other drugs, medicaments and biological substances status: Secondary | ICD-10-CM | POA: Diagnosis not present

## 2018-04-03 DIAGNOSIS — K219 Gastro-esophageal reflux disease without esophagitis: Secondary | ICD-10-CM | POA: Diagnosis not present

## 2018-04-03 DIAGNOSIS — I10 Essential (primary) hypertension: Secondary | ICD-10-CM

## 2018-04-03 DIAGNOSIS — Z79890 Hormone replacement therapy (postmenopausal): Secondary | ICD-10-CM | POA: Insufficient documentation

## 2018-04-03 DIAGNOSIS — E785 Hyperlipidemia, unspecified: Secondary | ICD-10-CM | POA: Diagnosis not present

## 2018-04-03 DIAGNOSIS — Z801 Family history of malignant neoplasm of trachea, bronchus and lung: Secondary | ICD-10-CM | POA: Diagnosis not present

## 2018-04-03 DIAGNOSIS — I5022 Chronic systolic (congestive) heart failure: Secondary | ICD-10-CM

## 2018-04-03 DIAGNOSIS — I4891 Unspecified atrial fibrillation: Secondary | ICD-10-CM | POA: Diagnosis not present

## 2018-04-03 DIAGNOSIS — Z79899 Other long term (current) drug therapy: Secondary | ICD-10-CM | POA: Diagnosis not present

## 2018-04-03 DIAGNOSIS — Z7901 Long term (current) use of anticoagulants: Secondary | ICD-10-CM | POA: Diagnosis not present

## 2018-04-03 NOTE — Patient Instructions (Signed)
Continue weighing daily and call for an overnight weight gain of > 2 pounds or a weekly weight gain of >5 pounds. 

## 2018-04-03 NOTE — Progress Notes (Signed)
Patient ID: Derrick Cochran, male    DOB: 1935-02-28, 83 y.o.   MRN: [id number]  HPI  Mr Cawley is a 83 y/o male with a history of MI, hypothyroidism, HTN, hyperlipidemia, GERD, DM, BPH, atrial fibrillation, AAA, remote tobacco use and chronic heart failure.   Echo report from 07/26/17 reviewed and showed an EF of 20% along with moderate MR/TR. Echo report from 12/06/16 reviewed and showed an EF of 20% along with mild AR and moderate MR/TR. Echo done 11/29/15 which showed an EF of 30-35% with bioprosthesis valve present. Also has mild MR. This is an improvement from his EF of 20-25% on 04/11/15. Last catheterization was done 12/04/13. CABG done 1995.  Has not been admitted or been in the ED in the last 6 months.   He presents today for a follow-up visit with a chief complaint of minimal fatigue upon moderate exertion. He describes this as chronic in nature having been present for several years. He has associated shortness of breath, light-headedness, weakness and gradual weight gain along with this. He denies any difficulty sleeping, abdominal distention, palpitations, pedal edema, chest pain or cough. Says that his appetite has improved and he's eating better. Now taking vitamin B12 injections.   Past Medical History:  Diagnosis Date  . AAA (abdominal aortic aneurysm) ([place])   . Arrhythmia    atrial fibrillation  . Arthritis   . BPH (benign prostatic hyperplasia)   . Cancer ([place])    skin  . CHF (congestive heart failure) ([place])   . Coronary artery disease   . DDD (degenerative disc disease), thoracic   . Diabetes mellitus without complication ([place])   . Esophageal stricture   . GERD (gastroesophageal reflux disease)   . Heart murmur   . Hyperlipidemia   . Hypertension   . Hypothyroidism   . Myocardial infarction ([place])   . Shortness of breath dyspnea     Past Surgical History:  Procedure Laterality Date  . AORTIC VALVE REPLACEMENT  1995   Bioprosthetic - [place]  . APPENDECTOMY    .  CARDIAC CATHETERIZATION  1994 x 2 with PCTA of RCA  . CARDIAC VALVE REPLACEMENT     Bovine transcatheter heart valve  . COLONOSCOPY  2015  . CORONARY ARTERY BYPASS GRAFT  1995   x 4 Vessels - [facility]  . ELECTROPHYSIOLOGIC STUDY N/A 08/06/2014   Procedure: Cardioversion;  Surgeon: [name], MD;  Location: [place];  Service: Cardiovascular;  Laterality: N/A;  . ELECTROPHYSIOLOGIC STUDY N/A 06/18/2014   Procedure: CARDIOVERSION;  Surgeon: [name], MD;  Location: [facility];  Service: Cardiovascular;  Laterality: N/A;  . ESOPHAGOGASTRODUODENOSCOPY  2014  . GREAT TOE ARTHRODESIS, INTERPHALANGEAL JOINT Left   . HERNIA REPAIR Left    Inguinal  . PACEMAKER INSERTION N/A 07/02/2014   Procedure: INSERTION PACEMAKER/DUAL CHAMBER  INITIAL IMPLANT;  Surgeon: [name], MD;  Location: [facility];  Service: Cardiovascular;  Laterality: N/A;  . SKIN BIOPSY      Family History  Problem Relation Age of Onset  . Cancer Mother   . Lung cancer Father   . Kidney disease Sister   . Heart failure Brother     Social History   Tobacco Use  . Smoking status: Former Smoker    Packs/day: 0.50    Years: 15.00    Pack years: 7.50    Types: Cigarettes    Last attempt to quit: 04/22/1985    Years since quitting: 32.9  . Smokeless tobacco: Former  User    Types: Chew  Substance Use Topics  . Alcohol use: No    Allergies  Allergen Reactions  . Cefdinir Other (See Comments)    nightmares  . Adhesive [Tape] Rash  . Sulfa Antibiotics Rash  . Sulfamethoxazole Hives, Itching and Rash   Past Medical History:  Diagnosis Date  . AAA (abdominal aortic aneurysm) ([place])   . Arrhythmia    atrial fibrillation  . Arthritis   . BPH (benign prostatic hyperplasia)   . Cancer ([place])    skin  . CHF (congestive heart failure) ([place])   . Coronary artery disease   . DDD (degenerative disc disease), thoracic   . Diabetes mellitus without complication ([place])   . Esophageal stricture    . GERD (gastroesophageal reflux disease)   . Heart murmur   . Hyperlipidemia   . Hypertension   . Hypothyroidism   . Myocardial infarction ([place])   . Shortness of breath dyspnea    Past Surgical History:  Procedure Laterality Date  . AORTIC VALVE REPLACEMENT  1995   Bioprosthetic - [place]  . APPENDECTOMY    . CARDIAC CATHETERIZATION  1994 x 2 with PCTA of RCA  . CARDIAC VALVE REPLACEMENT     Bovine transcatheter heart valve  . COLONOSCOPY  2015  . CORONARY ARTERY BYPASS GRAFT  1995   x 4 Vessels - [facility]  . ELECTROPHYSIOLOGIC STUDY N/A 08/06/2014   Procedure: Cardioversion;  Surgeon: [name], MD;  Location: [place] [facility];  Service: Cardiovascular;  Laterality: N/A;  . ELECTROPHYSIOLOGIC STUDY N/A 06/18/2014   Procedure: CARDIOVERSION;  Surgeon: [name], MD;  Location: [facility];  Service: Cardiovascular;  Laterality: N/A;  . ESOPHAGOGASTRODUODENOSCOPY  2014  . GREAT TOE ARTHRODESIS, INTERPHALANGEAL JOINT Left   . HERNIA REPAIR Left    Inguinal  . PACEMAKER INSERTION N/A 07/02/2014   Procedure: INSERTION PACEMAKER/DUAL CHAMBER  INITIAL IMPLANT;  Surgeon: [name], MD;  Location: [facility];  Service: Cardiovascular;  Laterality: N/A;  . SKIN BIOPSY     Family History  Problem Relation Age of Onset  . Cancer Mother   . Lung cancer Father   . Kidney disease Sister   . Heart failure Brother    Social History   Tobacco Use  . Smoking status: Former Smoker    Packs/day: 0.50    Years: 15.00    Pack years: 7.50    Types: Cigarettes    Last attempt to quit: 04/22/1985    Years since quitting: 32.9  . Smokeless tobacco: Former Systems developer    Types: Chew  Substance Use Topics  . Alcohol use: No   Allergies  Allergen Reactions  . Cefdinir Other (See Comments)    nightmares  . Adhesive [Tape] Rash  . Sulfa Antibiotics Rash  . Sulfamethoxazole Hives, Itching and Rash   Prior to Admission medications   Medication Sig Start Date End Date Taking?  Authorizing Provider  acetaminophen (TYLENOL) 500 MG tablet Take 500 mg by mouth every 6 (six) hours as needed for mild pain, moderate pain or fever.    Yes [provider]  cyanocobalamin (,VITAMIN B-12,) 1000 MCG/ML injection Inject 1,000 mcg into the muscle every 30 (thirty) days. 09/27/17  Yes [provider]  dapagliflozin propanediol (FARXIGA) 10 MG TABS tablet Take 10 mg by mouth daily. Study drug   Yes [provider]  enalapril (VASOTEC) 5 MG tablet Take 1 tablet (5 mg total) by mouth daily. Patient taking differently: Take 5 mg by mouth  2 (two) times daily.  11/30/15  Yes Mody, Ulice Bold, MD  furosemide (LASIX) 20 MG tablet Take 1 tablet (20 mg total) by mouth 2 (two) times daily. 09/25/17  Yes Dustin Flock, MD  gabapentin (NEURONTIN) 300 MG capsule Take 300 mg by mouth at bedtime.   Yes [provider]  glipiZIDE (GLUCOTROL XL) 5 MG 24 hr tablet Take 5 mg by mouth every morning. 01/21/15  Yes [provider]  levothyroxine (SYNTHROID, LEVOTHROID) 112 MCG tablet Take 112 mcg by mouth daily before breakfast.   Yes [provider]  metFORMIN (GLUCOPHAGE) 500 MG tablet Take 500 mg by mouth 2 (two) times daily.    Yes [provider]  metoprolol succinate (TOPROL-XL) 25 MG 24 hr tablet Take 25 mg by mouth daily.   Yes [provider]  nitroGLYCERIN (NITROSTAT) 0.4 MG SL tablet Place 0.4 mg under the tongue every 5 (five) minutes as needed for chest pain.   Yes [provider]  omeprazole (PRILOSEC) 20 MG capsule Take 20 mg by mouth every morning.    Yes [provider]  simvastatin (ZOCOR) 40 MG tablet Take 40 mg by mouth every evening.    Yes [provider]  sotalol (BETAPACE) 120 MG tablet Take 1 tablet (120 mg total) by mouth 2 (two) times daily. 11/30/15  Yes Mody, Ulice Bold, MD  tamsulosin (FLOMAX) 0.4 MG CAPS capsule Take 0.4 mg by mouth every morning.    Yes [provider]  temazepam  (RESTORIL) 30 MG capsule Take 30 mg by mouth at bedtime.    Yes [provider]  warfarin (COUMADIN) 1 MG tablet Take 1 mg by mouth every evening. Pt takes with 3mg  tablet for a total dose of 4mg .   Yes [provider]  warfarin (COUMADIN) 3 MG tablet Take 3 mg by mouth every evening. Pt takes with 1mg  tablet for a total dose of 4mg .   Yes [provider]  loratadine (CLARITIN) 10 MG tablet Take 1 tablet by mouth daily. 05/21/15 10/01/17  [provider]    Review of Systems  Constitutional: Positive for fatigue. Negative for appetite change.  HENT: Negative for congestion, postnasal drip, rhinorrhea and sore throat.   Eyes: Negative.   Respiratory: Positive for shortness of breath. Negative for cough and chest tightness.   Cardiovascular: Negative for chest pain, palpitations and leg swelling.  Gastrointestinal: Negative for abdominal distention and abdominal pain.  Endocrine: Negative.   Genitourinary: Negative.   Musculoskeletal: Positive for arthralgias (right hip pain). Negative for back pain.  Skin: Negative.   Allergic/Immunologic: Negative.   Neurological: Positive for light-headedness (at times). Negative for dizziness.  Hematological: Negative for adenopathy. Bruises/bleeds easily.  Psychiatric/Behavioral: Negative for dysphoric mood and sleep disturbance (sleeping better). The patient is not nervous/anxious.    Vitals:   04/03/18 1133  BP: 117/65  Pulse: 92  Resp: 16  Temp: 97.7 F (36.5 C)  TempSrc: Oral  SpO2: 96%  Weight: 170 lb 9.6 oz (77.4 kg)  Height: 5\' 11"  (1.803 m)   Wt Readings from Last 3 Encounters:  04/03/18 170 lb 9.6 oz (77.4 kg)  10/01/17 159 lb 6 oz (72.3 kg)  09/25/17 158 lb 3.2 oz (71.8 kg)   Lab Results  Component Value Date   CREATININE 1.05 09/24/2017   CREATININE 0.98 09/23/2017   CREATININE 1.24 11/29/2015    Physical Exam  Constitutional: He is oriented to person, place, and time. He appears  well-developed and well-nourished.  HENT:  Head:  Normocephalic and atraumatic.  Neck: Normal range of motion. Neck supple.  Cardiovascular: Normal rate and regular rhythm.  Pulmonary/Chest: Effort normal. He has no wheezes. He has no rales.  Abdominal: Soft. He exhibits no distension. There is no abdominal tenderness.  Musculoskeletal:        General: No tenderness or edema.  Neurological: He is alert and oriented to person, place, and time.  Skin: Skin is warm and dry.  Psychiatric: He has a normal mood and affect. His behavior is normal. Thought content normal.  Nursing note and vitals reviewed.  Assessment & Plan:  1: Chronic heart failure with reduced ejection fraction- - NYHA class II - euvolemic - weighing daily at home; Reminded to call for an overnight weight gain of >2 pounds or a weekly weight gain of >5 pounds - weight up 11 pounds from last visit here 6 months ago; says that his appetite has greatly improved - not adding salt to his food and trying to eat low sodium foods - doubtful his BP could tolerate changing from enalapril to entresto - saw his cardiologist ([place]) on 09/27/17 - BNP 09/23/17 was 4061.0  2: HTN- - BP looks good although on the low side  - saw PCP ([place]) on 01/24/18 - BMP on 01/24/18 reviewed and showed sodium 141, potassium 3.8, creatinine 1.2 and GFR 58  3: Diabetes- - yesterday patient says his glucose was <100 - A1c on 12/19/17 was 5.9%  Patient did not bring his medications nor a list. Each medication was verbally reviewed with the patient and he was encouraged to bring the bottles to every visit to confirm accuracy of list.  Return in 6 months or sooner for any questions/problems before then.

## 2018-04-05 ENCOUNTER — Ambulatory Visit (INDEPENDENT_AMBULATORY_CARE_PROVIDER_SITE_OTHER): Payer: Medicare HMO | Admitting: Vascular Surgery

## 2018-04-05 ENCOUNTER — Other Ambulatory Visit (INDEPENDENT_AMBULATORY_CARE_PROVIDER_SITE_OTHER): Payer: Medicare HMO

## 2018-08-01 DIAGNOSIS — I480 Paroxysmal atrial fibrillation: Secondary | ICD-10-CM | POA: Diagnosis not present

## 2018-08-29 DIAGNOSIS — I480 Paroxysmal atrial fibrillation: Secondary | ICD-10-CM | POA: Diagnosis not present

## 2018-09-26 DIAGNOSIS — I480 Paroxysmal atrial fibrillation: Secondary | ICD-10-CM | POA: Diagnosis not present

## 2018-09-30 NOTE — Progress Notes (Deleted)
Patient ID: Derrick Cochran, male    DOB: Jul 18, 1935, 83 y.o.   MRN: [id number]  HPI  Derrick Cochran is a 83 y/o male with a history of MI, hypothyroidism, HTN, hyperlipidemia, GERD, DM, BPH, atrial fibrillation, AAA, remote tobacco use and chronic heart failure.   Echo report from 07/26/17 reviewed and showed an EF of 20% along with moderate Derrick/TR. Echo report from 12/06/16 reviewed and showed an EF of 20% along with mild AR and moderate Derrick/TR. Echo done 11/29/15 which showed an EF of 30-35% with bioprosthesis valve present. Also has mild Derrick. This is an improvement from his EF of 20-25% on 04/11/15. Last catheterization was done 12/04/13. CABG done 1995.  Has not been admitted or been in the ED in the last 6 months.   He presents today for a follow-up visit with a chief complaint of   Past Medical History:  Diagnosis Date  . AAA (abdominal aortic aneurysm) ([place])   . Arrhythmia    atrial fibrillation  . Arthritis   . BPH (benign prostatic hyperplasia)   . Cancer ([place])    skin  . CHF (congestive heart failure) ([place])   . Coronary artery disease   . DDD (degenerative disc disease), thoracic   . Diabetes mellitus without complication ([place])   . Esophageal stricture   . GERD (gastroesophageal reflux disease)   . Heart murmur   . Hyperlipidemia   . Hypertension   . Hypothyroidism   . Myocardial infarction ([place])   . Shortness of breath dyspnea     Past Surgical History:  Procedure Laterality Date  . AORTIC VALVE REPLACEMENT  1995   Bioprosthetic - [place]  . APPENDECTOMY    . CARDIAC CATHETERIZATION  1994 x 2 with PCTA of RCA  . CARDIAC VALVE REPLACEMENT     Bovine transcatheter heart valve  . COLONOSCOPY  2015  . CORONARY ARTERY BYPASS GRAFT  1995   x 4 Vessels - [facility]  . ELECTROPHYSIOLOGIC STUDY N/A 08/06/2014   Procedure: Cardioversion;  Surgeon: [name], MD;  Location: [facility];  Service: Cardiovascular;  Laterality: N/A;  . ELECTROPHYSIOLOGIC STUDY N/A  06/18/2014   Procedure: CARDIOVERSION;  Surgeon: [name], MD;  Location: [facility];  Service: Cardiovascular;  Laterality: N/A;  . ESOPHAGOGASTRODUODENOSCOPY  2014  . GREAT TOE ARTHRODESIS, INTERPHALANGEAL JOINT Left   . HERNIA REPAIR Left    Inguinal  . PACEMAKER INSERTION N/A 07/02/2014   Procedure: INSERTION PACEMAKER/DUAL CHAMBER  INITIAL IMPLANT;  Surgeon: [name], MD;  Location: [facility];  Service: Cardiovascular;  Laterality: N/A;  . SKIN BIOPSY      Family History  Problem Relation Age of Onset  . Cancer Mother   . Lung cancer Father   . Kidney disease Sister   . Heart failure Brother     Social History   Tobacco Use  . Smoking status: Former Smoker    Packs/day: 0.50    Years: 15.00    Pack years: 7.50    Types: Cigarettes    Quit date: 04/22/1985    Years since quitting: 33.4  . Smokeless tobacco: Former Systems developer    Types: Chew  Substance Use Topics  . Alcohol use: No    Allergies  Allergen Reactions  . Cefdinir Other (See Comments)    nightmares  . Adhesive [Tape] Rash  . Sulfa Antibiotics Rash  . Sulfamethoxazole Hives, Itching and Rash   Past Medical History:  Diagnosis Date  . AAA (abdominal aortic aneurysm) ([place])   .  Arrhythmia    atrial fibrillation  . Arthritis   . BPH (benign prostatic hyperplasia)   . Cancer ([place])    skin  . CHF (congestive heart failure) ([place])   . Coronary artery disease   . DDD (degenerative disc disease), thoracic   . Diabetes mellitus without complication ([place])   . Esophageal stricture   . GERD (gastroesophageal reflux disease)   . Heart murmur   . Hyperlipidemia   . Hypertension   . Hypothyroidism   . Myocardial infarction ([place])   . Shortness of breath dyspnea    Past Surgical History:  Procedure Laterality Date  . AORTIC VALVE REPLACEMENT  1995   Bioprosthetic - [place]  . APPENDECTOMY    . CARDIAC CATHETERIZATION  1994 x 2 with PCTA of RCA  . CARDIAC VALVE REPLACEMENT     Bovine  transcatheter heart valve  . COLONOSCOPY  2015  . CORONARY ARTERY BYPASS GRAFT  1995   x 4 Vessels - [facility]  . ELECTROPHYSIOLOGIC STUDY N/A 08/06/2014   Procedure: Cardioversion;  Surgeon: [name], MD;  Location: [facility];  Service: Cardiovascular;  Laterality: N/A;  . ELECTROPHYSIOLOGIC STUDY N/A 06/18/2014   Procedure: CARDIOVERSION;  Surgeon: [name], MD;  Location: [facility];  Service: Cardiovascular;  Laterality: N/A;  . ESOPHAGOGASTRODUODENOSCOPY  2014  . GREAT TOE ARTHRODESIS, INTERPHALANGEAL JOINT Left   . HERNIA REPAIR Left    Inguinal  . PACEMAKER INSERTION N/A 07/02/2014   Procedure: INSERTION PACEMAKER/DUAL CHAMBER  INITIAL IMPLANT;  Surgeon: [name], MD;  Location: [facility];  Service: Cardiovascular;  Laterality: N/A;  . SKIN BIOPSY     Family History  Problem Relation Age of Onset  . Cancer Mother   . Lung cancer Father   . Kidney disease Sister   . Heart failure Brother    Social History   Tobacco Use  . Smoking status: Former Smoker    Packs/day: 0.50    Years: 15.00    Pack years: 7.50    Types: Cigarettes    Quit date: 04/22/1985    Years since quitting: 33.4  . Smokeless tobacco: Former Systems developer    Types: Chew  Substance Use Topics  . Alcohol use: No   Allergies  Allergen Reactions  . Cefdinir Other (See Comments)    nightmares  . Adhesive [Tape] Rash  . Sulfa Antibiotics Rash  . Sulfamethoxazole Hives, Itching and Rash     Review of Systems  Constitutional: Positive for fatigue. Negative for appetite change.  HENT: Negative for congestion, postnasal drip, rhinorrhea and sore throat.   Eyes: Negative.   Respiratory: Positive for shortness of breath. Negative for cough and chest tightness.   Cardiovascular: Negative for chest pain, palpitations and leg swelling.  Gastrointestinal: Negative for abdominal distention and abdominal pain.  Endocrine: Negative.   Genitourinary: Negative.   Musculoskeletal:  Positive for arthralgias (right hip pain). Negative for back pain.  Skin: Negative.   Allergic/Immunologic: Negative.   Neurological: Positive for light-headedness (at times). Negative for dizziness.  Hematological: Negative for adenopathy. Bruises/bleeds easily.  Psychiatric/Behavioral: Negative for dysphoric mood and sleep disturbance (sleeping better). The patient is not nervous/anxious.      Physical Exam  Constitutional: He is oriented to person, place, and time. He appears well-developed and well-nourished.  HENT:  Head: Normocephalic and atraumatic.  Neck: Normal range of motion. Neck supple.  Cardiovascular: Normal rate and regular rhythm.  Pulmonary/Chest: Effort normal. He has no wheezes. He has no rales.  Abdominal: Soft.  He exhibits no distension. There is no abdominal tenderness.  Musculoskeletal:        General: No tenderness or edema.  Neurological: He is alert and oriented to person, place, and time.  Skin: Skin is warm and dry.  Psychiatric: He has a normal mood and affect. His behavior is normal. Thought content normal.  Nursing note and vitals reviewed.  Assessment & Plan:  1: Chronic heart failure with reduced ejection fraction- - NYHA class II - euvolemic - weighing daily at home; Reminded to call for an overnight weight gain of >2 pounds or a weekly weight gain of >5 pounds - weight 170.9 pounds from last visit here 6 months ago - discuss farxiga - not adding salt to his food and trying to eat low sodium foods - doubtful his BP could tolerate changing from enalapril to entresto - saw his cardiologist ([place]) on 09/27/17 - BNP 09/23/17 was 4061.0  2: HTN- - BP  - saw PCP ([place]) on 01/24/18 - BMP on 01/24/18 reviewed and showed sodium 141, potassium 3.8, creatinine 1.2 and GFR 58  3: Diabetes- - yesterday patient says his glucose was - A1c on 12/19/17 was 5.9%  Patient did not bring his medications nor a list. Each medication was verbally reviewed  with the patient and he was encouraged to bring the bottles to every visit to confirm accuracy of list.

## 2018-10-02 ENCOUNTER — Ambulatory Visit: Payer: Medicare HMO | Admitting: Family

## 2018-10-22 DIAGNOSIS — I495 Sick sinus syndrome: Secondary | ICD-10-CM | POA: Diagnosis not present

## 2018-10-24 DIAGNOSIS — I480 Paroxysmal atrial fibrillation: Secondary | ICD-10-CM | POA: Diagnosis not present

## 2018-11-21 DIAGNOSIS — I480 Paroxysmal atrial fibrillation: Secondary | ICD-10-CM | POA: Diagnosis not present

## 2018-12-19 DIAGNOSIS — I480 Paroxysmal atrial fibrillation: Secondary | ICD-10-CM | POA: Diagnosis not present

## 2018-12-31 DIAGNOSIS — Z0000 Encounter for general adult medical examination without abnormal findings: Secondary | ICD-10-CM | POA: Diagnosis not present

## 2018-12-31 DIAGNOSIS — I4891 Unspecified atrial fibrillation: Secondary | ICD-10-CM | POA: Diagnosis not present

## 2018-12-31 DIAGNOSIS — I10 Essential (primary) hypertension: Secondary | ICD-10-CM | POA: Diagnosis not present

## 2018-12-31 DIAGNOSIS — E785 Hyperlipidemia, unspecified: Secondary | ICD-10-CM | POA: Diagnosis not present

## 2018-12-31 DIAGNOSIS — E119 Type 2 diabetes mellitus without complications: Secondary | ICD-10-CM | POA: Diagnosis not present

## 2018-12-31 DIAGNOSIS — G4700 Insomnia, unspecified: Secondary | ICD-10-CM | POA: Diagnosis not present

## 2018-12-31 DIAGNOSIS — R600 Localized edema: Secondary | ICD-10-CM | POA: Diagnosis not present

## 2018-12-31 DIAGNOSIS — E039 Hypothyroidism, unspecified: Secondary | ICD-10-CM | POA: Diagnosis not present

## 2018-12-31 DIAGNOSIS — Z7901 Long term (current) use of anticoagulants: Secondary | ICD-10-CM | POA: Diagnosis not present

## 2019-01-21 DIAGNOSIS — Z1211 Encounter for screening for malignant neoplasm of colon: Secondary | ICD-10-CM | POA: Diagnosis not present

## 2019-01-23 DIAGNOSIS — E039 Hypothyroidism, unspecified: Secondary | ICD-10-CM | POA: Diagnosis not present

## 2019-01-23 DIAGNOSIS — Z79899 Other long term (current) drug therapy: Secondary | ICD-10-CM | POA: Diagnosis not present

## 2019-01-23 DIAGNOSIS — I10 Essential (primary) hypertension: Secondary | ICD-10-CM | POA: Diagnosis not present

## 2019-01-23 DIAGNOSIS — I480 Paroxysmal atrial fibrillation: Secondary | ICD-10-CM | POA: Diagnosis not present

## 2019-01-23 DIAGNOSIS — H40153 Residual stage of open-angle glaucoma, bilateral: Secondary | ICD-10-CM | POA: Diagnosis not present

## 2019-01-23 DIAGNOSIS — Z125 Encounter for screening for malignant neoplasm of prostate: Secondary | ICD-10-CM | POA: Diagnosis not present

## 2019-01-23 DIAGNOSIS — E785 Hyperlipidemia, unspecified: Secondary | ICD-10-CM | POA: Diagnosis not present

## 2019-01-23 DIAGNOSIS — E119 Type 2 diabetes mellitus without complications: Secondary | ICD-10-CM | POA: Diagnosis not present

## 2019-01-23 DIAGNOSIS — E113393 Type 2 diabetes mellitus with moderate nonproliferative diabetic retinopathy without macular edema, bilateral: Secondary | ICD-10-CM | POA: Diagnosis not present

## 2019-01-23 DIAGNOSIS — H524 Presbyopia: Secondary | ICD-10-CM | POA: Diagnosis not present

## 2019-01-30 ENCOUNTER — Other Ambulatory Visit: Payer: Self-pay

## 2019-01-30 ENCOUNTER — Ambulatory Visit: Payer: Medicare HMO | Attending: Internal Medicine

## 2019-01-30 DIAGNOSIS — Z20822 Contact with and (suspected) exposure to covid-19: Secondary | ICD-10-CM

## 2019-01-30 DIAGNOSIS — Z20828 Contact with and (suspected) exposure to other viral communicable diseases: Secondary | ICD-10-CM | POA: Diagnosis not present

## 2019-02-01 ENCOUNTER — Telehealth: Payer: Self-pay | Admitting: General Practice

## 2019-02-01 LAB — NOVEL CORONAVIRUS, NAA: SARS-CoV-2, NAA: NOT DETECTED

## 2019-02-01 NOTE — Telephone Encounter (Signed)
Negative COVID results given. Patient results "NOT Detected." Caller expressed understanding. ° °

## 2019-02-20 DIAGNOSIS — I480 Paroxysmal atrial fibrillation: Secondary | ICD-10-CM | POA: Diagnosis not present

## 2019-03-07 DIAGNOSIS — H18413 Arcus senilis, bilateral: Secondary | ICD-10-CM | POA: Diagnosis not present

## 2019-03-07 DIAGNOSIS — E119 Type 2 diabetes mellitus without complications: Secondary | ICD-10-CM | POA: Diagnosis not present

## 2019-03-07 DIAGNOSIS — H40013 Open angle with borderline findings, low risk, bilateral: Secondary | ICD-10-CM | POA: Diagnosis not present

## 2019-03-07 DIAGNOSIS — H2513 Age-related nuclear cataract, bilateral: Secondary | ICD-10-CM | POA: Diagnosis not present

## 2019-03-14 DIAGNOSIS — H2513 Age-related nuclear cataract, bilateral: Secondary | ICD-10-CM | POA: Diagnosis not present

## 2019-03-14 DIAGNOSIS — H2511 Age-related nuclear cataract, right eye: Secondary | ICD-10-CM | POA: Diagnosis not present

## 2019-03-14 DIAGNOSIS — H401131 Primary open-angle glaucoma, bilateral, mild stage: Secondary | ICD-10-CM | POA: Diagnosis not present

## 2019-03-19 DIAGNOSIS — I480 Paroxysmal atrial fibrillation: Secondary | ICD-10-CM | POA: Diagnosis not present

## 2019-03-26 DIAGNOSIS — H5703 Miosis: Secondary | ICD-10-CM | POA: Diagnosis not present

## 2019-03-26 DIAGNOSIS — H2511 Age-related nuclear cataract, right eye: Secondary | ICD-10-CM | POA: Diagnosis not present

## 2019-03-27 DIAGNOSIS — H2512 Age-related nuclear cataract, left eye: Secondary | ICD-10-CM | POA: Diagnosis not present

## 2019-03-31 DIAGNOSIS — Z79899 Other long term (current) drug therapy: Secondary | ICD-10-CM | POA: Diagnosis not present

## 2019-03-31 DIAGNOSIS — D61818 Other pancytopenia: Secondary | ICD-10-CM | POA: Diagnosis not present

## 2019-03-31 DIAGNOSIS — E538 Deficiency of other specified B group vitamins: Secondary | ICD-10-CM | POA: Diagnosis not present

## 2019-04-11 DIAGNOSIS — D61818 Other pancytopenia: Secondary | ICD-10-CM | POA: Diagnosis not present

## 2019-04-13 ENCOUNTER — Ambulatory Visit: Payer: Medicare HMO | Attending: Internal Medicine

## 2019-04-13 DIAGNOSIS — Z23 Encounter for immunization: Secondary | ICD-10-CM | POA: Insufficient documentation

## 2019-04-13 NOTE — Progress Notes (Signed)
   Covid-19 Vaccination Clinic  Name:  Derrick Cochran    MRN: [id number] DOB: 1935-07-25  04/13/2019  Mr. Derrick Cochran was observed post Covid-19 immunization for 15 minutes without incidence. He was provided with Vaccine Information Sheet and instruction to access the V-Safe system.   Mr. Derrick Cochran was instructed to call 911 with any severe reactions post vaccine: Marland Kitchen Difficulty breathing  . Swelling of your face and throat  . A fast heartbeat  . A bad rash all over your body  . Dizziness and weakness    Immunizations Administered    Name Date Dose VIS Date Route   Pfizer COVID-19 Vaccine 04/13/2019  9:22 AM 0.3 mL 01/24/2019 Intramuscular   Manufacturer: [employer]   Lot: [id number]   [place]: [id number]

## 2019-04-17 DIAGNOSIS — Z79899 Other long term (current) drug therapy: Secondary | ICD-10-CM | POA: Diagnosis not present

## 2019-04-17 DIAGNOSIS — D61818 Other pancytopenia: Secondary | ICD-10-CM | POA: Diagnosis not present

## 2019-04-17 DIAGNOSIS — E538 Deficiency of other specified B group vitamins: Secondary | ICD-10-CM | POA: Diagnosis not present

## 2019-04-17 DIAGNOSIS — Z125 Encounter for screening for malignant neoplasm of prostate: Secondary | ICD-10-CM | POA: Diagnosis not present

## 2019-04-17 DIAGNOSIS — I480 Paroxysmal atrial fibrillation: Secondary | ICD-10-CM | POA: Diagnosis not present

## 2019-05-05 ENCOUNTER — Ambulatory Visit: Payer: Medicare HMO | Attending: Internal Medicine

## 2019-05-05 DIAGNOSIS — Z23 Encounter for immunization: Secondary | ICD-10-CM

## 2019-05-05 NOTE — Progress Notes (Signed)
   Covid-19 Vaccination Clinic  Name:  [name]    MRN: [id number] DOB: 1935/08/29  05/05/2019  [name] was observed post Covid-19 immunization for 15 minutes without incident. He was provided with Vaccine Information Sheet and instruction to access the V-Safe system.   [name] was instructed to call 911 with any severe reactions post vaccine: Marland Kitchen Difficulty breathing  . Swelling of face and throat  . A fast heartbeat  . A bad rash all over body  . Dizziness and weakness   Immunizations Administered    Name Date Dose VIS Date Route   Pfizer COVID-19 Vaccine 05/05/2019  9:15 AM 0.3 mL 01/24/2019 Intramuscular   Manufacturer: [employer]   Lot: [id number]   [place]: [id number]

## 2019-05-15 DIAGNOSIS — I482 Chronic atrial fibrillation, unspecified: Secondary | ICD-10-CM | POA: Diagnosis not present

## 2019-05-28 DIAGNOSIS — H2512 Age-related nuclear cataract, left eye: Secondary | ICD-10-CM | POA: Diagnosis not present

## 2019-06-12 DIAGNOSIS — I482 Chronic atrial fibrillation, unspecified: Secondary | ICD-10-CM | POA: Diagnosis not present

## 2019-06-26 DIAGNOSIS — Z961 Presence of intraocular lens: Secondary | ICD-10-CM | POA: Diagnosis not present

## 2019-07-01 DIAGNOSIS — E079 Disorder of thyroid, unspecified: Secondary | ICD-10-CM | POA: Diagnosis not present

## 2019-07-01 DIAGNOSIS — Z79890 Hormone replacement therapy (postmenopausal): Secondary | ICD-10-CM | POA: Diagnosis not present

## 2019-07-01 DIAGNOSIS — Z0000 Encounter for general adult medical examination without abnormal findings: Secondary | ICD-10-CM | POA: Diagnosis not present

## 2019-07-01 DIAGNOSIS — Z7901 Long term (current) use of anticoagulants: Secondary | ICD-10-CM | POA: Diagnosis not present

## 2019-07-01 DIAGNOSIS — I10 Essential (primary) hypertension: Secondary | ICD-10-CM | POA: Diagnosis not present

## 2019-07-01 DIAGNOSIS — R0602 Shortness of breath: Secondary | ICD-10-CM | POA: Diagnosis not present

## 2019-07-01 DIAGNOSIS — I4891 Unspecified atrial fibrillation: Secondary | ICD-10-CM | POA: Diagnosis not present

## 2019-07-01 DIAGNOSIS — E785 Hyperlipidemia, unspecified: Secondary | ICD-10-CM | POA: Diagnosis not present

## 2019-07-01 DIAGNOSIS — E119 Type 2 diabetes mellitus without complications: Secondary | ICD-10-CM | POA: Diagnosis not present

## 2019-07-08 DIAGNOSIS — E785 Hyperlipidemia, unspecified: Secondary | ICD-10-CM | POA: Diagnosis not present

## 2019-07-08 DIAGNOSIS — Z951 Presence of aortocoronary bypass graft: Secondary | ICD-10-CM | POA: Diagnosis not present

## 2019-07-08 DIAGNOSIS — Z79899 Other long term (current) drug therapy: Secondary | ICD-10-CM | POA: Diagnosis not present

## 2019-07-08 DIAGNOSIS — I350 Nonrheumatic aortic (valve) stenosis: Secondary | ICD-10-CM | POA: Diagnosis not present

## 2019-07-08 DIAGNOSIS — I482 Chronic atrial fibrillation, unspecified: Secondary | ICD-10-CM | POA: Diagnosis not present

## 2019-07-08 DIAGNOSIS — Z953 Presence of xenogenic heart valve: Secondary | ICD-10-CM | POA: Diagnosis not present

## 2019-07-08 DIAGNOSIS — I495 Sick sinus syndrome: Secondary | ICD-10-CM | POA: Diagnosis not present

## 2019-07-10 DIAGNOSIS — I480 Paroxysmal atrial fibrillation: Secondary | ICD-10-CM | POA: Diagnosis not present

## 2019-07-10 DIAGNOSIS — E118 Type 2 diabetes mellitus with unspecified complications: Secondary | ICD-10-CM | POA: Diagnosis not present

## 2019-07-10 DIAGNOSIS — E039 Hypothyroidism, unspecified: Secondary | ICD-10-CM | POA: Diagnosis not present

## 2019-07-10 DIAGNOSIS — I10 Essential (primary) hypertension: Secondary | ICD-10-CM | POA: Diagnosis not present

## 2019-07-10 DIAGNOSIS — Z79899 Other long term (current) drug therapy: Secondary | ICD-10-CM | POA: Diagnosis not present

## 2019-07-10 DIAGNOSIS — E785 Hyperlipidemia, unspecified: Secondary | ICD-10-CM | POA: Diagnosis not present

## 2019-07-10 DIAGNOSIS — Z125 Encounter for screening for malignant neoplasm of prostate: Secondary | ICD-10-CM | POA: Diagnosis not present

## 2019-07-24 DIAGNOSIS — I255 Ischemic cardiomyopathy: Secondary | ICD-10-CM | POA: Diagnosis not present

## 2019-07-24 DIAGNOSIS — I480 Paroxysmal atrial fibrillation: Secondary | ICD-10-CM | POA: Diagnosis not present

## 2019-07-24 DIAGNOSIS — R0602 Shortness of breath: Secondary | ICD-10-CM | POA: Diagnosis not present

## 2019-08-01 DIAGNOSIS — I495 Sick sinus syndrome: Secondary | ICD-10-CM | POA: Diagnosis not present

## 2019-08-01 DIAGNOSIS — I5022 Chronic systolic (congestive) heart failure: Secondary | ICD-10-CM | POA: Diagnosis not present

## 2019-08-01 DIAGNOSIS — I10 Essential (primary) hypertension: Secondary | ICD-10-CM | POA: Diagnosis not present

## 2019-08-01 DIAGNOSIS — I2119 ST elevation (STEMI) myocardial infarction involving other coronary artery of inferior wall: Secondary | ICD-10-CM | POA: Diagnosis not present

## 2019-08-01 DIAGNOSIS — I4891 Unspecified atrial fibrillation: Secondary | ICD-10-CM | POA: Diagnosis not present

## 2019-08-01 DIAGNOSIS — I350 Nonrheumatic aortic (valve) stenosis: Secondary | ICD-10-CM | POA: Diagnosis not present

## 2019-08-01 DIAGNOSIS — I482 Chronic atrial fibrillation, unspecified: Secondary | ICD-10-CM | POA: Diagnosis not present

## 2019-08-01 DIAGNOSIS — I4821 Permanent atrial fibrillation: Secondary | ICD-10-CM | POA: Diagnosis not present

## 2019-08-01 DIAGNOSIS — I214 Non-ST elevation (NSTEMI) myocardial infarction: Secondary | ICD-10-CM | POA: Diagnosis not present

## 2019-08-07 DIAGNOSIS — I480 Paroxysmal atrial fibrillation: Secondary | ICD-10-CM | POA: Diagnosis not present

## 2019-09-04 DIAGNOSIS — I480 Paroxysmal atrial fibrillation: Secondary | ICD-10-CM | POA: Diagnosis not present

## 2019-09-29 ENCOUNTER — Other Ambulatory Visit: Payer: Self-pay

## 2019-09-29 ENCOUNTER — Emergency Department
Admission: EM | Admit: 2019-09-29 | Discharge: 2019-09-29 | Disposition: A | Discharge status: Home / Self Care | Payer: Medicare HMO | Source: Home / Self Care

## 2019-09-29 ENCOUNTER — Emergency Department: Payer: Medicare HMO

## 2019-09-29 DIAGNOSIS — N1330 Unspecified hydronephrosis: Secondary | ICD-10-CM | POA: Diagnosis not present

## 2019-09-29 DIAGNOSIS — K8020 Calculus of gallbladder without cholecystitis without obstruction: Secondary | ICD-10-CM | POA: Diagnosis not present

## 2019-09-29 DIAGNOSIS — Z79890 Hormone replacement therapy (postmenopausal): Secondary | ICD-10-CM | POA: Diagnosis not present

## 2019-09-29 DIAGNOSIS — Z8679 Personal history of other diseases of the circulatory system: Secondary | ICD-10-CM | POA: Diagnosis not present

## 2019-09-29 DIAGNOSIS — R0602 Shortness of breath: Secondary | ICD-10-CM | POA: Insufficient documentation

## 2019-09-29 DIAGNOSIS — N400 Enlarged prostate without lower urinary tract symptoms: Secondary | ICD-10-CM | POA: Diagnosis present

## 2019-09-29 DIAGNOSIS — Z66 Do not resuscitate: Secondary | ICD-10-CM | POA: Diagnosis present

## 2019-09-29 DIAGNOSIS — I10 Essential (primary) hypertension: Secondary | ICD-10-CM | POA: Diagnosis not present

## 2019-09-29 DIAGNOSIS — I255 Ischemic cardiomyopathy: Secondary | ICD-10-CM | POA: Diagnosis not present

## 2019-09-29 DIAGNOSIS — R1084 Generalized abdominal pain: Secondary | ICD-10-CM | POA: Diagnosis not present

## 2019-09-29 DIAGNOSIS — Z882 Allergy status to sulfonamides status: Secondary | ICD-10-CM | POA: Diagnosis not present

## 2019-09-29 DIAGNOSIS — I110 Hypertensive heart disease with heart failure: Secondary | ICD-10-CM | POA: Diagnosis present

## 2019-09-29 DIAGNOSIS — Z8249 Family history of ischemic heart disease and other diseases of the circulatory system: Secondary | ICD-10-CM | POA: Diagnosis not present

## 2019-09-29 DIAGNOSIS — Z888 Allergy status to other drugs, medicaments and biological substances status: Secondary | ICD-10-CM | POA: Diagnosis not present

## 2019-09-29 DIAGNOSIS — R5381 Other malaise: Secondary | ICD-10-CM | POA: Diagnosis not present

## 2019-09-29 DIAGNOSIS — R1011 Right upper quadrant pain: Secondary | ICD-10-CM | POA: Diagnosis not present

## 2019-09-29 DIAGNOSIS — E119 Type 2 diabetes mellitus without complications: Secondary | ICD-10-CM | POA: Diagnosis present

## 2019-09-29 DIAGNOSIS — Z5321 Procedure and treatment not carried out due to patient leaving prior to being seen by health care provider: Secondary | ICD-10-CM | POA: Insufficient documentation

## 2019-09-29 DIAGNOSIS — I2510 Atherosclerotic heart disease of native coronary artery without angina pectoris: Secondary | ICD-10-CM | POA: Diagnosis present

## 2019-09-29 DIAGNOSIS — I5022 Chronic systolic (congestive) heart failure: Secondary | ICD-10-CM | POA: Diagnosis present

## 2019-09-29 DIAGNOSIS — Z20822 Contact with and (suspected) exposure to covid-19: Secondary | ICD-10-CM | POA: Diagnosis present

## 2019-09-29 DIAGNOSIS — R0902 Hypoxemia: Secondary | ICD-10-CM | POA: Diagnosis not present

## 2019-09-29 DIAGNOSIS — Z9049 Acquired absence of other specified parts of digestive tract: Secondary | ICD-10-CM | POA: Diagnosis not present

## 2019-09-29 DIAGNOSIS — R1010 Upper abdominal pain, unspecified: Secondary | ICD-10-CM | POA: Diagnosis not present

## 2019-09-29 DIAGNOSIS — R791 Abnormal coagulation profile: Secondary | ICD-10-CM | POA: Diagnosis not present

## 2019-09-29 DIAGNOSIS — Z7901 Long term (current) use of anticoagulants: Secondary | ICD-10-CM | POA: Diagnosis not present

## 2019-09-29 DIAGNOSIS — R112 Nausea with vomiting, unspecified: Secondary | ICD-10-CM | POA: Insufficient documentation

## 2019-09-29 DIAGNOSIS — K810 Acute cholecystitis: Secondary | ICD-10-CM | POA: Diagnosis present

## 2019-09-29 DIAGNOSIS — M1990 Unspecified osteoarthritis, unspecified site: Secondary | ICD-10-CM | POA: Diagnosis present

## 2019-09-29 DIAGNOSIS — R109 Unspecified abdominal pain: Secondary | ICD-10-CM | POA: Diagnosis not present

## 2019-09-29 DIAGNOSIS — R52 Pain, unspecified: Secondary | ICD-10-CM | POA: Diagnosis not present

## 2019-09-29 DIAGNOSIS — M545 Low back pain: Secondary | ICD-10-CM | POA: Diagnosis not present

## 2019-09-29 DIAGNOSIS — Z801 Family history of malignant neoplasm of trachea, bronchus and lung: Secondary | ICD-10-CM | POA: Diagnosis not present

## 2019-09-29 DIAGNOSIS — I480 Paroxysmal atrial fibrillation: Secondary | ICD-10-CM | POA: Diagnosis not present

## 2019-09-29 DIAGNOSIS — R072 Precordial pain: Secondary | ICD-10-CM | POA: Insufficient documentation

## 2019-09-29 DIAGNOSIS — E785 Hyperlipidemia, unspecified: Secondary | ICD-10-CM | POA: Diagnosis present

## 2019-09-29 DIAGNOSIS — K8000 Calculus of gallbladder with acute cholecystitis without obstruction: Secondary | ICD-10-CM | POA: Diagnosis present

## 2019-09-29 DIAGNOSIS — I4891 Unspecified atrial fibrillation: Secondary | ICD-10-CM | POA: Diagnosis present

## 2019-09-29 DIAGNOSIS — E118 Type 2 diabetes mellitus with unspecified complications: Secondary | ICD-10-CM | POA: Diagnosis not present

## 2019-09-29 DIAGNOSIS — I252 Old myocardial infarction: Secondary | ICD-10-CM | POA: Diagnosis not present

## 2019-09-29 DIAGNOSIS — Z881 Allergy status to other antibiotic agents status: Secondary | ICD-10-CM | POA: Diagnosis not present

## 2019-09-29 DIAGNOSIS — E039 Hypothyroidism, unspecified: Secondary | ICD-10-CM | POA: Diagnosis present

## 2019-09-29 DIAGNOSIS — R319 Hematuria, unspecified: Secondary | ICD-10-CM | POA: Diagnosis not present

## 2019-09-29 DIAGNOSIS — N179 Acute kidney failure, unspecified: Secondary | ICD-10-CM | POA: Diagnosis not present

## 2019-09-29 LAB — TROPONIN I (HIGH SENSITIVITY): Troponin I (High Sensitivity): 11 ng/L (ref <18)

## 2019-09-29 LAB — BASIC METABOLIC PANEL
Anion gap: 11 (ref 5–15)
BUN: 26 mg/dL — ABNORMAL HIGH (ref 8–23)
CO2: 26 mmol/L (ref 22–32)
Calcium: 8.7 mg/dL — ABNORMAL LOW (ref 8.9–10.3)
Chloride: 99 mmol/L (ref 98–111)
Creatinine, Ser: 1.15 mg/dL (ref 0.61–1.24)
GFR calc Af Amer: >60 mL/min (ref >60)
GFR calc non Af Amer: 59 mL/min — ABNORMAL LOW (ref >60)
Glucose, Bld: 179 mg/dL — ABNORMAL HIGH (ref 70–99)
Potassium: 4.5 mmol/L (ref 3.5–5.1)
Sodium: 136 mmol/L (ref 135–145)

## 2019-09-29 LAB — CBC
HCT: 38.3 % — ABNORMAL LOW (ref 39.0–52.0)
Hemoglobin: 12.8 g/dL — ABNORMAL LOW (ref 13.0–17.0)
MCH: 32.7 pg (ref 26.0–34.0)
MCHC: 33.4 g/dL (ref 30.0–36.0)
MCV: 98 fL (ref 80.0–100.0)
Platelets: 83 10*3/uL — ABNORMAL LOW (ref 150–400)
RBC: 3.91 MIL/uL — ABNORMAL LOW (ref 4.22–5.81)
RDW: 12.6 % (ref 11.5–15.5)
WBC: 9.7 10*3/uL (ref 4.0–10.5)
nRBC: 0 % (ref 0.0–0.2)

## 2019-09-29 NOTE — ED Triage Notes (Signed)
Pt comes via POV from home with c/o CP and SOB. Pt states this started early this am. Pt states no current CP.   Pt states lower mid sternal CP. Pt states some nausea and vomiting.

## 2019-09-30 ENCOUNTER — Other Ambulatory Visit: Payer: Self-pay

## 2019-09-30 ENCOUNTER — Emergency Department
Admission: EM | Admit: 2019-09-30 | Discharge: 2019-09-30 | Disposition: A | Discharge status: Home / Self Care | Payer: Medicare HMO | Source: Home / Self Care

## 2019-09-30 ENCOUNTER — Emergency Department: Payer: Medicare HMO

## 2019-09-30 DIAGNOSIS — R1032 Left lower quadrant pain: Secondary | ICD-10-CM | POA: Insufficient documentation

## 2019-09-30 DIAGNOSIS — R197 Diarrhea, unspecified: Secondary | ICD-10-CM | POA: Insufficient documentation

## 2019-09-30 DIAGNOSIS — M549 Dorsalgia, unspecified: Secondary | ICD-10-CM | POA: Insufficient documentation

## 2019-09-30 DIAGNOSIS — Z5321 Procedure and treatment not carried out due to patient leaving prior to being seen by health care provider: Secondary | ICD-10-CM | POA: Insufficient documentation

## 2019-09-30 LAB — COMPREHENSIVE METABOLIC PANEL
ALT: 51 U/L — ABNORMAL HIGH (ref 0–44)
AST: 74 U/L — ABNORMAL HIGH (ref 15–41)
Albumin: 3.5 g/dL (ref 3.5–5.0)
Alkaline Phosphatase: 74 U/L (ref 38–126)
Anion gap: 14 (ref 5–15)
BUN: 32 mg/dL — ABNORMAL HIGH (ref 8–23)
CO2: 25 mmol/L (ref 22–32)
Calcium: 9.1 mg/dL (ref 8.9–10.3)
Chloride: 97 mmol/L — ABNORMAL LOW (ref 98–111)
Creatinine, Ser: 1.33 mg/dL — ABNORMAL HIGH (ref 0.61–1.24)
GFR calc Af Amer: 57 mL/min — ABNORMAL LOW (ref >60)
GFR calc non Af Amer: 49 mL/min — ABNORMAL LOW (ref >60)
Glucose, Bld: 179 mg/dL — ABNORMAL HIGH (ref 70–99)
Potassium: 4.4 mmol/L (ref 3.5–5.1)
Sodium: 136 mmol/L (ref 135–145)
Total Bilirubin: 1.8 mg/dL — ABNORMAL HIGH (ref 0.3–1.2)
Total Protein: 8 g/dL (ref 6.5–8.1)

## 2019-09-30 LAB — CBC
HCT: 40.5 % (ref 39.0–52.0)
Hemoglobin: 13.4 g/dL (ref 13.0–17.0)
MCH: 32.6 pg (ref 26.0–34.0)
MCHC: 33.1 g/dL (ref 30.0–36.0)
MCV: 98.5 fL (ref 80.0–100.0)
Platelets: 103 10*3/uL — ABNORMAL LOW (ref 150–400)
RBC: 4.11 MIL/uL — ABNORMAL LOW (ref 4.22–5.81)
RDW: 12.7 % (ref 11.5–15.5)
WBC: 17.5 10*3/uL — ABNORMAL HIGH (ref 4.0–10.5)
nRBC: 0 % (ref 0.0–0.2)

## 2019-09-30 LAB — LIPASE, BLOOD: Lipase: 110 U/L — ABNORMAL HIGH (ref 11–51)

## 2019-09-30 NOTE — ED Triage Notes (Signed)
See first nurse note. Pt reports LLQ pain and back pain for "a couple of months". Pt came yesterday but LWBS. Pt states he takes tylenol for the pain but it does not help. Reports some blood in stool but reports hx of hemorrhoids. States blood in stool is bright red. Denies hematuria. Pt speaking with this RN in complete sentences without shob, NAD noted, skin warm and dry.

## 2019-09-30 NOTE — ED Notes (Addendum)
Pt called for room.  No answer.  Pt left without being seen last night.  Per screener pt told her he had left.

## 2019-09-30 NOTE — ED Triage Notes (Signed)
First nurse note- here last night and left before seen.  LLQ pain per EMS.  Mother requested EMS take pt to [facility] but [place].  VSS with EMS. Alert and oriented with EMS

## 2019-10-01 ENCOUNTER — Other Ambulatory Visit: Payer: Self-pay | Admitting: Physician Assistant

## 2019-10-01 ENCOUNTER — Other Ambulatory Visit: Payer: Self-pay

## 2019-10-01 ENCOUNTER — Inpatient Hospital Stay
Admission: EM | Admit: 2019-10-01 | Discharge: 2019-10-07 | DRG: 445 | Disposition: A | Discharge status: Home / Self Care | Payer: Medicare HMO | Attending: Surgery | Admitting: Surgery

## 2019-10-01 ENCOUNTER — Ambulatory Visit
Admission: RE | Admit: 2019-10-01 | Discharge: 2019-10-01 | Disposition: A | Discharge status: Home / Self Care | Payer: Medicare HMO | Source: Ambulatory Visit | Attending: Physician Assistant | Admitting: Physician Assistant

## 2019-10-01 DIAGNOSIS — Z8679 Personal history of other diseases of the circulatory system: Secondary | ICD-10-CM | POA: Diagnosis not present

## 2019-10-01 DIAGNOSIS — R791 Abnormal coagulation profile: Secondary | ICD-10-CM | POA: Diagnosis not present

## 2019-10-01 DIAGNOSIS — Z950 Presence of cardiac pacemaker: Secondary | ICD-10-CM

## 2019-10-01 DIAGNOSIS — Z8249 Family history of ischemic heart disease and other diseases of the circulatory system: Secondary | ICD-10-CM

## 2019-10-01 DIAGNOSIS — Z881 Allergy status to other antibiotic agents status: Secondary | ICD-10-CM | POA: Diagnosis not present

## 2019-10-01 DIAGNOSIS — Z888 Allergy status to other drugs, medicaments and biological substances status: Secondary | ICD-10-CM | POA: Diagnosis not present

## 2019-10-01 DIAGNOSIS — R109 Unspecified abdominal pain: Secondary | ICD-10-CM | POA: Diagnosis not present

## 2019-10-01 DIAGNOSIS — Z79899 Other long term (current) drug therapy: Secondary | ICD-10-CM

## 2019-10-01 DIAGNOSIS — K810 Acute cholecystitis: Secondary | ICD-10-CM

## 2019-10-01 DIAGNOSIS — I252 Old myocardial infarction: Secondary | ICD-10-CM

## 2019-10-01 DIAGNOSIS — I5022 Chronic systolic (congestive) heart failure: Secondary | ICD-10-CM | POA: Diagnosis present

## 2019-10-01 DIAGNOSIS — I480 Paroxysmal atrial fibrillation: Secondary | ICD-10-CM | POA: Diagnosis not present

## 2019-10-01 DIAGNOSIS — K8000 Calculus of gallbladder with acute cholecystitis without obstruction: Secondary | ICD-10-CM | POA: Diagnosis present

## 2019-10-01 DIAGNOSIS — I255 Ischemic cardiomyopathy: Secondary | ICD-10-CM | POA: Diagnosis not present

## 2019-10-01 DIAGNOSIS — Z801 Family history of malignant neoplasm of trachea, bronchus and lung: Secondary | ICD-10-CM | POA: Diagnosis not present

## 2019-10-01 DIAGNOSIS — K819 Cholecystitis, unspecified: Secondary | ICD-10-CM

## 2019-10-01 DIAGNOSIS — Z9049 Acquired absence of other specified parts of digestive tract: Secondary | ICD-10-CM

## 2019-10-01 DIAGNOSIS — I110 Hypertensive heart disease with heart failure: Secondary | ICD-10-CM | POA: Diagnosis present

## 2019-10-01 DIAGNOSIS — N1330 Unspecified hydronephrosis: Secondary | ICD-10-CM | POA: Diagnosis not present

## 2019-10-01 DIAGNOSIS — I2510 Atherosclerotic heart disease of native coronary artery without angina pectoris: Secondary | ICD-10-CM | POA: Diagnosis present

## 2019-10-01 DIAGNOSIS — R1010 Upper abdominal pain, unspecified: Secondary | ICD-10-CM | POA: Diagnosis not present

## 2019-10-01 DIAGNOSIS — Z79890 Hormone replacement therapy (postmenopausal): Secondary | ICD-10-CM | POA: Diagnosis not present

## 2019-10-01 DIAGNOSIS — Z952 Presence of prosthetic heart valve: Secondary | ICD-10-CM

## 2019-10-01 DIAGNOSIS — Z20822 Contact with and (suspected) exposure to covid-19: Secondary | ICD-10-CM | POA: Diagnosis present

## 2019-10-01 DIAGNOSIS — I4891 Unspecified atrial fibrillation: Secondary | ICD-10-CM | POA: Diagnosis present

## 2019-10-01 DIAGNOSIS — E118 Type 2 diabetes mellitus with unspecified complications: Secondary | ICD-10-CM | POA: Diagnosis not present

## 2019-10-01 DIAGNOSIS — R1011 Right upper quadrant pain: Secondary | ICD-10-CM | POA: Insufficient documentation

## 2019-10-01 DIAGNOSIS — Z87891 Personal history of nicotine dependence: Secondary | ICD-10-CM

## 2019-10-01 DIAGNOSIS — E119 Type 2 diabetes mellitus without complications: Secondary | ICD-10-CM | POA: Diagnosis present

## 2019-10-01 DIAGNOSIS — M1990 Unspecified osteoarthritis, unspecified site: Secondary | ICD-10-CM | POA: Diagnosis present

## 2019-10-01 DIAGNOSIS — N400 Enlarged prostate without lower urinary tract symptoms: Secondary | ICD-10-CM | POA: Diagnosis present

## 2019-10-01 DIAGNOSIS — Z882 Allergy status to sulfonamides status: Secondary | ICD-10-CM | POA: Diagnosis not present

## 2019-10-01 DIAGNOSIS — Z951 Presence of aortocoronary bypass graft: Secondary | ICD-10-CM

## 2019-10-01 DIAGNOSIS — N179 Acute kidney failure, unspecified: Secondary | ICD-10-CM | POA: Diagnosis not present

## 2019-10-01 DIAGNOSIS — E039 Hypothyroidism, unspecified: Secondary | ICD-10-CM | POA: Diagnosis present

## 2019-10-01 DIAGNOSIS — Z7901 Long term (current) use of anticoagulants: Secondary | ICD-10-CM | POA: Diagnosis not present

## 2019-10-01 DIAGNOSIS — Z66 Do not resuscitate: Secondary | ICD-10-CM | POA: Diagnosis present

## 2019-10-01 DIAGNOSIS — E785 Hyperlipidemia, unspecified: Secondary | ICD-10-CM | POA: Diagnosis present

## 2019-10-01 DIAGNOSIS — K8020 Calculus of gallbladder without cholecystitis without obstruction: Secondary | ICD-10-CM

## 2019-10-01 LAB — COMPREHENSIVE METABOLIC PANEL
ALT: 30 U/L (ref 0–44)
AST: 28 U/L (ref 15–41)
Albumin: 3.3 g/dL — ABNORMAL LOW (ref 3.5–5.0)
Alkaline Phosphatase: 57 U/L (ref 38–126)
Anion gap: 16 — ABNORMAL HIGH (ref 5–15)
BUN: 53 mg/dL — ABNORMAL HIGH (ref 8–23)
CO2: 23 mmol/L (ref 22–32)
Calcium: 9 mg/dL (ref 8.9–10.3)
Chloride: 97 mmol/L — ABNORMAL LOW (ref 98–111)
Creatinine, Ser: 2.15 mg/dL — ABNORMAL HIGH (ref 0.61–1.24)
GFR calc Af Amer: 32 mL/min — ABNORMAL LOW (ref >60)
GFR calc non Af Amer: 28 mL/min — ABNORMAL LOW (ref >60)
Glucose, Bld: 133 mg/dL — ABNORMAL HIGH (ref 70–99)
Potassium: 4.4 mmol/L (ref 3.5–5.1)
Sodium: 136 mmol/L (ref 135–145)
Total Bilirubin: 2.9 mg/dL — ABNORMAL HIGH (ref 0.3–1.2)
Total Protein: 7.7 g/dL (ref 6.5–8.1)

## 2019-10-01 LAB — CBC
HCT: 38.6 % — ABNORMAL LOW (ref 39.0–52.0)
Hemoglobin: 12.9 g/dL — ABNORMAL LOW (ref 13.0–17.0)
MCH: 32.9 pg (ref 26.0–34.0)
MCHC: 33.4 g/dL (ref 30.0–36.0)
MCV: 98.5 fL (ref 80.0–100.0)
Platelets: 105 10*3/uL — ABNORMAL LOW (ref 150–400)
RBC: 3.92 MIL/uL — ABNORMAL LOW (ref 4.22–5.81)
RDW: 13.2 % (ref 11.5–15.5)
WBC: 18.2 10*3/uL — ABNORMAL HIGH (ref 4.0–10.5)
nRBC: 0 % (ref 0.0–0.2)

## 2019-10-01 LAB — PROTIME-INR
INR: 2.5 — ABNORMAL HIGH (ref 0.8–1.2)
Prothrombin Time: 26.4 seconds — ABNORMAL HIGH (ref 11.4–15.2)

## 2019-10-01 LAB — APTT: aPTT: 41 seconds — ABNORMAL HIGH (ref 24–36)

## 2019-10-01 LAB — BILIRUBIN, DIRECT: Bilirubin, Direct: 1.1 mg/dL — ABNORMAL HIGH (ref 0.0–0.2)

## 2019-10-01 LAB — SARS CORONAVIRUS 2 BY RT PCR (HOSPITAL ORDER, PERFORMED IN ~~LOC~~ HOSPITAL LAB): SARS Coronavirus 2: NEGATIVE

## 2019-10-01 LAB — LACTIC ACID, PLASMA
Lactic Acid, Venous: 3.6 mmol/L (ref 0.5–1.9)
Lactic Acid, Venous: 2.9 mmol/L (ref 0.5–1.9)

## 2019-10-01 LAB — GLUCOSE, CAPILLARY: Glucose-Capillary: 131 mg/dL — ABNORMAL HIGH (ref 70–99)

## 2019-10-01 LAB — LIPASE, BLOOD: Lipase: 22 U/L (ref 11–51)

## 2019-10-01 MED ORDER — TAMSULOSIN HCL 0.4 MG PO CAPS
0.4000 mg | ORAL_CAPSULE | Freq: Every morning | ORAL | Status: DC
  Administered 2019-10-02 – 2019-10-07 (×5): 0.4 mg via ORAL
  Filled 2019-10-01 (×5): qty 1

## 2019-10-01 MED ORDER — METRONIDAZOLE IN NACL 5-0.79 MG/ML-% IV SOLN
500.0000 mg | Freq: Once | INTRAVENOUS | Status: AC
  Administered 2019-10-01: 500 mg via INTRAVENOUS
  Filled 2019-10-01: qty 100

## 2019-10-01 MED ORDER — HEPARIN BOLUS VIA INFUSION
3550.0000 [IU] | Freq: Once | INTRAVENOUS | Status: DC
  Filled 2019-10-01: qty 3550

## 2019-10-01 MED ORDER — ACETAMINOPHEN 500 MG PO TABS
500.0000 mg | ORAL_TABLET | Freq: Four times a day (QID) | ORAL | Status: DC | PRN
  Administered 2019-10-03 – 2019-10-07 (×4): 500 mg via ORAL
  Filled 2019-10-01 (×4): qty 1

## 2019-10-01 MED ORDER — HEPARIN (PORCINE) 25000 UT/250ML-% IV SOLN
1000.0000 [IU]/h | INTRAVENOUS | Status: DC
  Administered 2019-10-01: 1000 [IU]/h via INTRAVENOUS
  Filled 2019-10-01: qty 250

## 2019-10-01 MED ORDER — FUROSEMIDE 20 MG PO TABS
20.0000 mg | ORAL_TABLET | Freq: Two times a day (BID) | ORAL | Status: DC
  Administered 2019-10-02 – 2019-10-07 (×10): 20 mg via ORAL
  Filled 2019-10-01 (×10): qty 1

## 2019-10-01 MED ORDER — TRAMADOL HCL 50 MG PO TABS: 50.0000 mg | ORAL_TABLET | Freq: Four times a day (QID) | ORAL | Status: DC | PRN

## 2019-10-01 MED ORDER — INSULIN ASPART 100 UNIT/ML ~~LOC~~ SOLN
0.0000 [IU] | Freq: Three times a day (TID) | SUBCUTANEOUS | Status: DC
  Administered 2019-10-03: 2 [IU] via SUBCUTANEOUS
  Administered 2019-10-04: 12:00:00 3 [IU] via SUBCUTANEOUS
  Administered 2019-10-05: 5 [IU] via SUBCUTANEOUS
  Administered 2019-10-06: 10:00:00 2 [IU] via SUBCUTANEOUS
  Administered 2019-10-06: 17:00:00 3 [IU] via SUBCUTANEOUS
  Administered 2019-10-07 (×2): 2 [IU] via SUBCUTANEOUS
  Filled 2019-10-01 (×5): qty 1

## 2019-10-01 MED ORDER — MORPHINE SULFATE (PF) 2 MG/ML IV SOLN: 2.0000 mg | INTRAVENOUS | Status: DC | PRN

## 2019-10-01 MED ORDER — SIMVASTATIN 20 MG PO TABS
40.0000 mg | ORAL_TABLET | Freq: Every evening | ORAL | Status: DC
  Administered 2019-10-01 – 2019-10-06 (×6): 40 mg via ORAL
  Filled 2019-10-01 (×3): qty 2
  Filled 2019-10-01: qty 4
  Filled 2019-10-01 (×2): qty 2

## 2019-10-01 MED ORDER — PANTOPRAZOLE SODIUM 40 MG PO TBEC
40.0000 mg | DELAYED_RELEASE_TABLET | Freq: Every day | ORAL | Status: DC
  Administered 2019-10-01 – 2019-10-07 (×6): 40 mg via ORAL
  Filled 2019-10-01 (×6): qty 1

## 2019-10-01 MED ORDER — HYDROCODONE-ACETAMINOPHEN 5-325 MG PO TABS: 1.0000 | ORAL_TABLET | ORAL | Status: DC | PRN

## 2019-10-01 MED ORDER — CYANOCOBALAMIN 1000 MCG/ML IJ SOLN: 1000.0000 ug | INTRAMUSCULAR | Status: DC

## 2019-10-01 MED ORDER — SODIUM CHLORIDE 0.9 % IV SOLN
2.0000 g | Freq: Once | INTRAVENOUS | Status: AC
  Administered 2019-10-01: 2 g via INTRAVENOUS
  Filled 2019-10-01: qty 20

## 2019-10-01 MED ORDER — DOCUSATE SODIUM 100 MG PO CAPS: 100.0000 mg | ORAL_CAPSULE | Freq: Two times a day (BID) | ORAL | Status: DC | PRN

## 2019-10-01 MED ORDER — ENALAPRIL MALEATE 5 MG PO TABS
5.0000 mg | ORAL_TABLET | Freq: Two times a day (BID) | ORAL | Status: DC
  Administered 2019-10-01 – 2019-10-05 (×8): 5 mg via ORAL
  Filled 2019-10-01 (×12): qty 1

## 2019-10-01 MED ORDER — ONDANSETRON HCL 4 MG/2ML IJ SOLN: 4.0000 mg | Freq: Four times a day (QID) | INTRAMUSCULAR | Status: DC | PRN

## 2019-10-01 MED ORDER — NITROGLYCERIN 0.4 MG SL SUBL: 0.4000 mg | SUBLINGUAL_TABLET | SUBLINGUAL | Status: DC | PRN

## 2019-10-01 MED ORDER — SODIUM CHLORIDE 0.9 % IV BOLUS (SEPSIS)
500.0000 mL | Freq: Once | INTRAVENOUS | Status: AC
  Administered 2019-10-01: 500 mL via INTRAVENOUS

## 2019-10-01 MED ORDER — SODIUM CHLORIDE 0.9 % IV BOLUS
500.0000 mL | Freq: Once | INTRAVENOUS | Status: AC
  Administered 2019-10-01: 500 mL via INTRAVENOUS

## 2019-10-01 MED ORDER — ONDANSETRON 4 MG PO TBDP
4.0000 mg | ORAL_TABLET | Freq: Four times a day (QID) | ORAL | Status: DC | PRN
  Filled 2019-10-01: qty 1

## 2019-10-01 MED ORDER — LEVOTHYROXINE SODIUM 112 MCG PO TABS
112.0000 ug | ORAL_TABLET | Freq: Every day | ORAL | Status: DC
  Administered 2019-10-02 – 2019-10-07 (×5): 112 ug via ORAL
  Filled 2019-10-01 (×6): qty 1

## 2019-10-01 MED ORDER — SODIUM CHLORIDE 0.9 % IV SOLN: INTRAVENOUS | Status: DC

## 2019-10-01 MED ORDER — PIPERACILLIN-TAZOBACTAM 3.375 G IVPB
3.3750 g | Freq: Three times a day (TID) | INTRAVENOUS | Status: DC
  Administered 2019-10-01 – 2019-10-07 (×17): 3.375 g via INTRAVENOUS
  Filled 2019-10-01 (×21): qty 50

## 2019-10-01 MED ORDER — METOPROLOL SUCCINATE ER 25 MG PO TB24
25.0000 mg | ORAL_TABLET | Freq: Every day | ORAL | Status: DC
  Administered 2019-10-01 – 2019-10-05 (×4): 25 mg via ORAL
  Filled 2019-10-01 (×5): qty 1

## 2019-10-01 MED ORDER — GABAPENTIN 300 MG PO CAPS
300.0000 mg | ORAL_CAPSULE | Freq: Every day | ORAL | Status: DC
  Administered 2019-10-01 – 2019-10-06 (×6): 300 mg via ORAL
  Filled 2019-10-01 (×6): qty 1

## 2019-10-01 MED ORDER — TEMAZEPAM 7.5 MG PO CAPS
30.0000 mg | ORAL_CAPSULE | Freq: Every day | ORAL | Status: DC
  Administered 2019-10-02 – 2019-10-06 (×5): 30 mg via ORAL
  Filled 2019-10-01: qty 1
  Filled 2019-10-01 (×5): qty 4

## 2019-10-01 NOTE — Progress Notes (Signed)
CODE SEPSIS - PHARMACY COMMUNICATION  **Broad Spectrum Antibiotics should be administered within 1 hour of Sepsis diagnosis**  Time Code Sepsis Called/Page Received: 1722  Antibiotics Ordered: ceftriaxone, metronidazole  Time of 1st antibiotic administration: 1807 - ceftriaxone  Additional action taken by pharmacy: Called RN at 1801 about timing of abx (~20 minutes left at time of call)  If necessary, Name of Provider/Nurse Contacted:     Rocky Morel ,PharmD Clinical Pharmacist  10/01/2019  6:16 PM

## 2019-10-01 NOTE — Consult Note (Signed)
[place] for Heparin  Indication: atrial fibrillation  Allergies  Allergen Reactions  . Cefdinir Other (See Comments)    nightmares  . Adhesive [Tape] Rash  . Sulfa Antibiotics Rash  . Sulfamethoxazole Hives, Itching and Rash    Patient Measurements: Height: 6' (182.9 cm) Weight: 71.7 kg (158 lb) IBW/kg (Calculated) : 77.6 Heparin Dosing Weight: 71.7 kg   Vital Signs: Temp: 97.7 F (36.5 C) (08/18 1600) Temp Source: Oral (08/18 1600) BP: 115/104 (08/18 1600) Pulse Rate: 68 (08/18 1600)  Labs: Recent Labs    09/29/19 1725 09/29/19 1725 09/30/19 1510 10/01/19 1609 10/01/19 2031  HGB 12.8*   < > 13.4 12.9*  --   HCT 38.3*  --  40.5 38.6*  --   PLT 83*  --  103* 105*  --   LABPROT  --   --   --   --  26.4*  INR  --   --   --   --  2.5*  CREATININE 1.15  --  1.33* 2.15*  --   TROPONINIHS 11  --   --   --   --    < > = values in this interval not displayed.    Estimated Creatinine Clearance: 26.9 mL/min (A) (by C-G formula based on SCr of 2.15 mg/dL (H)).   Medications:  Per d/w pharmacy technician, patient reports taking warfarin 3 mg [id number] and 4 mg TuW. His last dose of warfarin was yesterday. Pharmacy was unable to reach patient's wife at this time.   Assessment: Derrick Cochran is a 84 y.o. male who is has a PMH significant for atrial fibrillation, CHF, hypothyrodium, and MI. Patient is being bridged with heparin while warfarin is being held for possible cholecystostomy tube placement.   Baseline platelets (09/24/2017): 83 Current platelets 105    Goal of Therapy:  Heparin level 0.3-0.7 units/ml Monitor platelets by anticoagulation protocol: Yes   Plan:  Baseline labs have been ordered  Heparin DW: 71.7kg  D/w Dr. Lysle Pearl to hold heparin bolus given platelet count Start heparin infusion at 1000 units/hr Check anti-Xa level in 8 hours and daily while on heparin, per protocol Continue to monitor H&H and  platelets    Derrick Cochran R Derrick Cochran 10/01/2019,10:10 PM

## 2019-10-01 NOTE — ED Provider Notes (Signed)
[facility] Emergency Department Provider Note ____________________________________________   First MD Initiated Contact with Patient 10/01/19 1708     (approximate)  I have reviewed the triage vital signs and the nursing notes.   HISTORY  Chief Complaint Abdominal Pain and Flank Pain    HPI Derrick Cochran is a 84 y.o. male with PMH as noted below including CHF, CAD, atrial fibrillation, diabetes, and hypertension who presents with right-sided abdominal and back pain over proximal last 5 days, persistent course, and associated with nausea and vomiting he denies prior history of this pain.  He states he had already presented to the ED twice this week but both times left before being seen.  He was seen at [facility] clinic today and sent for an outpatient ultrasound which revealed gallstones, and he was told to come back to the ED.  Past Medical History:  Diagnosis Date  . AAA (abdominal aortic aneurysm) ([place])   . Arrhythmia    atrial fibrillation  . Arthritis   . BPH (benign prostatic hyperplasia)   . Cancer ([place])    skin  . CHF (congestive heart failure) ([place])   . Coronary artery disease   . DDD (degenerative disc disease), thoracic   . Diabetes mellitus without complication ([place])   . Esophageal stricture   . GERD (gastroesophageal reflux disease)   . Heart murmur   . Hyperlipidemia   . Hypertension   . Hypothyroidism   . Myocardial infarction ([place])   . Shortness of breath dyspnea     Patient Active Problem List   Diagnosis Date Noted  . CHF (congestive heart failure) ([place]) 09/23/2017  . AAA (abdominal aortic aneurysm) without rupture ([place]) 02/15/2016  . Palpitations 11/28/2015  . Dyspnea 11/28/2015  . Chronic systolic heart failure ([place]) 04/23/2015  . HTN (hypertension) 04/23/2015  . Diabetes ([place]) 04/23/2015  . Sick sinus syndrome ([place]) 07/03/2014  . Atrial fibrillation ([place]) 06/17/2014    Past Surgical History:  Procedure Laterality  Date  . AORTIC VALVE REPLACEMENT  1995   Bioprosthetic - [facility]  . APPENDECTOMY    . CARDIAC CATHETERIZATION  1994 x 2 with PCTA of RCA  . CARDIAC VALVE REPLACEMENT     Bovine transcatheter heart valve  . COLONOSCOPY  2015  . CORONARY ARTERY BYPASS GRAFT  1995   x 4 Vessels - [facility]  . ELECTROPHYSIOLOGIC STUDY N/A 08/06/2014   Procedure: Cardioversion;  Surgeon: [name], MD;  Location: [place] [facility];  Service: Cardiovascular;  Laterality: N/A;  . ELECTROPHYSIOLOGIC STUDY N/A 06/18/2014   Procedure: CARDIOVERSION;  Surgeon: [name], MD;  Location: [facility] OR[facility];  Service: Cardiovascular;  Laterality: N/A;  . ESOPHAGOGASTRODUODENOSCOPY  2014  . GREAT TOE ARTHRODESIS, INTERPHALANGEAL JOINT Left   . HERNIA REPAIR Left    Inguinal  . PACEMAKER INSERTION N/A 07/02/2014   Procedure: INSERTION PACEMAKER/DUAL CHAMBER  INITIAL IMPLANT;  Surgeon: [name], MD;  Location: [facility] ORS;  Service: Cardiovascular;  Laterality: N/A;  . SKIN BIOPSY      Prior to Admission medications   Medication Sig Start Date End Date Taking? Authorizing Provider  acetaminophen (TYLENOL) 500 MG tablet Take 500 mg by mouth every 6 (six) hours as needed for mild pain, moderate pain or fever.     [provider]  cyanocobalamin (,VITAMIN B-12,) 1000 MCG/ML injection Inject 1,000 mcg into the muscle every 30 (thirty) days. 09/27/17   [provider]  dapagliflozin propanediol (FARXIGA) 10 MG TABS tablet Take 10 mg by mouth  daily. Study drug    [provider]  enalapril (VASOTEC) 5 MG tablet Take 1 tablet (5 mg total) by mouth daily. Patient taking differently: Take 5 mg by mouth 2 (two) times daily.  11/30/15   Bettey Costa, MD  furosemide (LASIX) 20 MG tablet Take 1 tablet (20 mg total) by mouth 2 (two) times daily. 09/25/17   Dustin Flock, MD  gabapentin (NEURONTIN) 300 MG capsule Take 300 mg by mouth at bedtime.    [provider]  glipiZIDE (GLUCOTROL XL)  5 MG 24 hr tablet Take 5 mg by mouth every morning. 01/21/15   [provider]  levothyroxine (SYNTHROID, LEVOTHROID) 112 MCG tablet Take 112 mcg by mouth daily before breakfast.    [provider]  loratadine (CLARITIN) 10 MG tablet Take 1 tablet by mouth daily. 05/21/15 10/01/17  [provider]  metFORMIN (GLUCOPHAGE) 500 MG tablet Take 500 mg by mouth 2 (two) times daily.     [provider]  metoprolol succinate (TOPROL-XL) 25 MG 24 hr tablet Take 25 mg by mouth daily.    [provider]  nitroGLYCERIN (NITROSTAT) 0.4 MG SL tablet Place 0.4 mg under the tongue every 5 (five) minutes as needed for chest pain.    [provider]  omeprazole (PRILOSEC) 20 MG capsule Take 20 mg by mouth every morning.     [provider]  simvastatin (ZOCOR) 40 MG tablet Take 40 mg by mouth every evening.     [provider]  sotalol (BETAPACE) 120 MG tablet Take 1 tablet (120 mg total) by mouth 2 (two) times daily. 11/30/15   Bettey Costa, MD  tamsulosin (FLOMAX) 0.4 MG CAPS capsule Take 0.4 mg by mouth every morning.     [provider]  temazepam (RESTORIL) 30 MG capsule Take 30 mg by mouth at bedtime.     [provider]  warfarin (COUMADIN) 1 MG tablet Take 1 mg by mouth every evening. Pt takes with 3mg  tablet for a total dose of 4mg .    [provider]  warfarin (COUMADIN) 3 MG tablet Take 3 mg by mouth every evening. Pt takes with 1mg  tablet for a total dose of 4mg .    [provider]    Allergies Cefdinir, Adhesive [tape], Sulfa antibiotics, and Sulfamethoxazole  Family History  Problem Relation Age of Onset  . Cancer Mother   . Lung cancer Father   . Kidney disease Sister   . Heart failure Brother     Social History Social History   Tobacco Use  . Smoking status: Former Smoker    Packs/day: 0.50    Years: 15.00    Pack years: 7.50    Types: Cigarettes    Quit date: 04/22/1985    Years  since quitting: 34.4  . Smokeless tobacco: Former Systems developer    Types: Chew  Substance Use Topics  . Alcohol use: No  . Drug use: No    Review of Systems  Constitutional: No fever/chills. Eyes: No redness. ENT: No sore throat. Cardiovascular: Denies chest pain. Respiratory: Denies shortness of breath. Gastrointestinal: Positive for nausea and vomiting. Genitourinary: Negative for dysuria.  Musculoskeletal: Positive for back pain. Skin: Negative for rash. Neurological: Negative for headache.   ____________________________________________   PHYSICAL EXAM:  VITAL SIGNS: ED Triage Vitals  Enc Vitals Group     BP 10/01/19 1600 (!) 115/104     Pulse Rate 10/01/19 1600 68     Resp 10/01/19 1600 18  Temp 10/01/19 1600 97.7 F (36.5 C)     Temp Source 10/01/19 1600 Oral     SpO2 10/01/19 1600 95 %     Weight 10/01/19 1602 158 lb (71.7 kg)     Height 10/01/19 1602 6' (1.829 m)     Head Circumference --      Peak Flow --      Pain Score 10/01/19 1602 0     Pain Loc --      Pain Edu? --      Excl. in [place]? --     Constitutional: Alert and oriented.  Uncomfortable appearing but in no acute distress. Eyes: Conjunctivae are normal.  No scleral icterus. Head: Atraumatic. Nose: No congestion/rhinnorhea. Mouth/Throat: Mucous membranes are moist.   Neck: Normal range of motion.  Cardiovascular: Normal rate, regular rhythm.  Good peripheral circulation. Respiratory: Normal respiratory effort.  No retractions.  Gastrointestinal: Soft with moderate right upper quadrant tenderness.  No distention.  Genitourinary: No flank tenderness. Musculoskeletal: Extremities warm and well perfused.  Neurologic:  Normal speech and language. No gross focal neurologic deficits are appreciated.  Skin:  Skin is warm and dry. No rash noted. Psychiatric: Mood and affect are normal. Speech and behavior are normal.  ____________________________________________   LABS (all labs ordered are listed, but  only abnormal results are displayed)  Labs Reviewed  GLUCOSE, CAPILLARY - Abnormal; Notable for the following components:      Result Value   Glucose-Capillary 131 (*)    All other components within normal limits  COMPREHENSIVE METABOLIC PANEL - Abnormal; Notable for the following components:   Chloride 97 (*)    Glucose, Bld 133 (*)    BUN 53 (*)    Creatinine, Ser 2.15 (*)    Albumin 3.3 (*)    Total Bilirubin 2.9 (*)    GFR calc non Af Amer 28 (*)    GFR calc Af Amer 32 (*)    Anion gap 16 (*)    All other components within normal limits  CBC - Abnormal; Notable for the following components:   WBC 18.2 (*)    RBC 3.92 (*)    Hemoglobin 12.9 (*)    HCT 38.6 (*)    Platelets 105 (*)    All other components within normal limits  LACTIC ACID, PLASMA - Abnormal; Notable for the following components:   Lactic Acid, Venous 3.6 (*)    All other components within normal limits  LACTIC ACID, PLASMA - Abnormal; Notable for the following components:   Lactic Acid, Venous 2.9 (*)    All other components within normal limits  BILIRUBIN, DIRECT - Abnormal; Notable for the following components:   Bilirubin, Direct 1.1 (*)    All other components within normal limits  CULTURE, BLOOD (ROUTINE X 2)  CULTURE, BLOOD (ROUTINE X 2)  URINE CULTURE  SARS CORONAVIRUS 2 BY RT PCR (HOSPITAL ORDER, [facility])  LIPASE, BLOOD  URINALYSIS, COMPLETE (UACMP) WITH MICROSCOPIC   ____________________________________________  EKG  ED ECG REPORT I, Arta Silence, the attending physician, personally viewed and interpreted this ECG.  Date: 10/01/2019 EKG Time: 1601 Rate: 72 Rhythm: Atrial fibrillation QRS Axis: Right axis Intervals: Nonspecific IVCD ST/T Wave abnormalities: normal Narrative Interpretation: no evidence of acute  ischemia  ____________________________________________  RADIOLOGY    ____________________________________________   PROCEDURES  Procedure(s) performed: No  Procedures  Critical Care performed: Yes  CRITICAL CARE Performed by: Arta Silence   Total critical care time: 30 minutes  Critical care time was exclusive of separately billable procedures and treating other patients.  Critical care was necessary to treat or prevent imminent or life-threatening deterioration.  Critical care was time spent personally by me on the following activities: development of treatment plan with patient and/or surrogate as well as nursing, discussions with consultants, evaluation of patient's response to treatment, examination of patient, obtaining history from patient or surrogate, ordering and performing treatments and interventions, ordering and review of laboratory studies, ordering and review of radiographic studies, pulse oximetry and re-evaluation of patient's condition. ____________________________________________   INITIAL IMPRESSION / ASSESSMENT AND PLAN / ED COURSE  Pertinent labs & imaging results that were available during my care of the patient were reviewed by me and considered in my medical decision making (see chart for details).  84 year old male with PMH as noted above presents with right-sided abdominal and back pain over the last several days.  The patient presented to the ED twice this week but left before being seen due to wait times. He was seen at [facility] clinic earlier today and had a right upper quadrant ultrasound.  I reviewed the past medical records in [place]. The patient was most recently admitted in 2019 with respiratory failure due to acute on chronic CHF.  Ultrasound performed earlier today shows cholelithiasis and gallbladder sludge, with wall thickening and edema and possible pericholecystic fluid, concerning for acute cholecystitis.  On exam, the patient  has moderate right upper quadrant tenderness. Exam is otherwise as described above. His vital signs are normal. I have initiated labs, fluids, and empiric antibiotics per the sepsis protocol. I will consult general surgery.  ----------------------------------------- 7:01 PM on 10/01/2019 -----------------------------------------  I did gust the case with Dr. Lysle Pearl from general surgery who recommended getting a direct bilirubin to help evaluate for biliary obstruction. If this is significant elevated the patient would need an MRCP. However, it is 1.1. Dr. Lysle Pearl will come to evaluate the patient.  ----------------------------------------- 8:15 PM on 10/01/2019 -----------------------------------------  Dr. Lysle Pearl will admit the patient to his service.  Repeat lactate has improved.  The patient remains clinically stable. ____________________________________________   FINAL CLINICAL IMPRESSION(S) / ED DIAGNOSES  Final diagnoses:  Cholecystitis      NEW MEDICATIONS STARTED DURING THIS VISIT:  New Prescriptions   No medications on file     Note:  This document was prepared using Dragon voice recognition software and may include unintentional dictation errors.    Arta Silence, MD 10/01/19 2016

## 2019-10-01 NOTE — ED Triage Notes (Signed)
Pt arrives from home with c/o being seen at [facility] yesterday. Wife reports they were contacted by [facility] today with reports that pt currently has gallstone and needed to come in to ED. Pt a&o x4. Wife reports pt has not eaten and concerned about cbg level, and states he has fallen 2x today. NAD, or  respiratory distress noted at this time  cbg in triage 131

## 2019-10-01 NOTE — H&P (Signed)
Subjective:   CC: Acute cholecystitis  HPI:  Derrick Cochran is a 84 y.o. male who is consulted by [facility] for evaluation of above cc.  Symptoms were first noted 2 days ago. Pain is sharp, confined to the right upper quadrant.  Associated with decreased appetite, exacerbated by nothing specific     Past Medical History:  has a past medical history of AAA (abdominal aortic aneurysm) ([place]), Arrhythmia, Arthritis, BPH (benign prostatic hyperplasia), Cancer ([place]), CHF (congestive heart failure) ([place]), Coronary artery disease, DDD (degenerative disc disease), thoracic, Diabetes mellitus without complication ([place]), Esophageal stricture, GERD (gastroesophageal reflux disease), Heart murmur, Hyperlipidemia, Hypertension, Hypothyroidism, Myocardial infarction ([place]), and Shortness of breath dyspnea.  Past Surgical History:  has a past surgical history that includes Cardiac catheterization (1994 x 2 with PCTA of RCA); Appendectomy; Cardiac valve replacement; Great toe arthrodesis, interphalangeal joint (Left); Pacemaker insertion (N/A, 07/02/2014); Cardiac catheterization (N/A, 08/06/2014); Cardiac catheterization (N/A, 06/18/2014); Coronary artery bypass graft (1995); Aortic valve replacement (1995); Hernia repair (Left); Colonoscopy (2015); Esophagogastroduodenoscopy (2014); and Skin biopsy.  Family History: family history includes Cancer in his mother; Heart failure in his brother; Kidney disease in his sister; Lung cancer in his father.  Social History:  reports that he quit smoking about 34 years ago. His smoking use included cigarettes. He has a 7.50 pack-year smoking history. He has quit using smokeless tobacco.  His smokeless tobacco use included chew. He reports that he does not drink alcohol and does not use drugs.  Current Medications:  Prior to Admission medications   Medication Sig Start Date End Date Taking? Authorizing Provider  acetaminophen (TYLENOL) 500 MG tablet Take 500 mg by mouth every 6  (six) hours as needed for mild pain, moderate pain or fever.     [provider]  cyanocobalamin (,VITAMIN B-12,) 1000 MCG/ML injection Inject 1,000 mcg into the muscle every 30 (thirty) days. 09/27/17   [provider]  dapagliflozin propanediol (FARXIGA) 10 MG TABS tablet Take 10 mg by mouth daily. Study drug    [provider]  enalapril (VASOTEC) 5 MG tablet Take 1 tablet (5 mg total) by mouth daily. Patient taking differently: Take 5 mg by mouth 2 (two) times daily.  11/30/15   Bettey Costa, MD  furosemide (LASIX) 20 MG tablet Take 1 tablet (20 mg total) by mouth 2 (two) times daily. 09/25/17   Dustin Flock, MD  gabapentin (NEURONTIN) 300 MG capsule Take 300 mg by mouth at bedtime.    [provider]  glipiZIDE (GLUCOTROL XL) 5 MG 24 hr tablet Take 5 mg by mouth every morning. 01/21/15   [provider]  levothyroxine (SYNTHROID, LEVOTHROID) 112 MCG tablet Take 112 mcg by mouth daily before breakfast.    [provider]  loratadine (CLARITIN) 10 MG tablet Take 1 tablet by mouth daily. 05/21/15 10/01/17  [provider]  metFORMIN (GLUCOPHAGE) 500 MG tablet Take 500 mg by mouth 2 (two) times daily.     [provider]  metoprolol succinate (TOPROL-XL) 25 MG 24 hr tablet Take 25 mg by mouth daily.    [provider]  nitroGLYCERIN (NITROSTAT) 0.4 MG SL tablet Place 0.4 mg under the tongue every 5 (five) minutes as needed for chest pain.    [provider]  omeprazole (PRILOSEC) 20 MG capsule Take 20 mg by mouth every morning.     [provider]  simvastatin (ZOCOR) 40 MG tablet Take 40 mg by mouth every evening.     [provider]  sotalol (BETAPACE) 120 MG tablet Take 1 tablet (120 mg total) by mouth 2 (two) times daily. 11/30/15   Bettey Costa, MD  tamsulosin (FLOMAX) 0.4 MG CAPS capsule Take 0.4 mg by mouth every morning.     [provider]  temazepam (RESTORIL) 30 MG capsule Take  30 mg by mouth at bedtime.     [provider]  warfarin (COUMADIN) 1 MG tablet Take 1 mg by mouth every evening. Pt takes with 3mg  tablet for a total dose of 4mg .    [provider]  warfarin (COUMADIN) 3 MG tablet Take 3 mg by mouth every evening. Pt takes with 1mg  tablet for a total dose of 4mg .    [provider]    Allergies:  Allergies as of 10/01/2019 - Review Complete 10/01/2019  Allergen Reaction Noted  . Cefdinir Other (See Comments) 04/23/2015  . Adhesive [tape] Rash 06/29/2014  . Sulfa antibiotics Rash 06/18/2014  . Sulfamethoxazole Hives, Itching, and Rash 08/05/2014    ROS:  General: Denies weight loss, weight gain, fatigue, fevers, chills, and night sweats. Eyes: Denies blurry vision, double vision, eye pain, itchy eyes, and tearing. Ears: Denies hearing loss, earache, and ringing in ears. Nose: Denies sinus pain, congestion, infections, runny nose, and nosebleeds. Mouth/throat: Denies hoarseness, sore throat, bleeding gums, and difficulty swallowing. Heart: Denies chest pain, palpitations, racing heart, irregular heartbeat, leg pain or swelling, and decreased activity tolerance. Respiratory: Denies breathing difficulty, shortness of breath, wheezing, cough, and sputum. GI: Denies heartburn, nausea, vomiting, constipation, diarrhea, and blood in stool. GU: Denies difficulty urinating, pain with urinating, urgency, frequency, blood in urine. Musculoskeletal: Denies joint stiffness, pain, swelling, muscle weakness. Skin: Denies rash, itching, mass, tumors, sores, and boils Neurologic: Denies headache, fainting, dizziness, seizures, numbness, and tingling. Psychiatric: Denies depression, anxiety, difficulty sleeping, and memory loss. Endocrine: Denies heat or cold intolerance, and increased thirst or urination. Blood/lymph: Denies easy bruising, and swollen glands     Objective:     BP (!) 115/104 (BP Location: Right Arm)   Pulse 68   Temp  97.7 F (36.5 C) (Oral)   Resp 18   Ht 6' (1.829 m)   Wt 71.7 kg   SpO2 95%   BMI 21.43 kg/m    Constitutional :  alert, cooperative, appears stated age and no distress  Lymphatics/Throat:  no asymmetry, masses, or scars  Respiratory:  clear to auscultation bilaterally  Cardiovascular:  irregularly irregular rhythm  Gastrointestinal: Soft, no guarding, focal tenderness to palpation in right upper quadrant.   Musculoskeletal: Steady movement  Skin: Cool and moist  Psychiatric: Normal affect, non-agitated, not confused       LABS:  CMP Latest Ref Rng & Units 10/01/2019 09/30/2019 09/29/2019  Glucose 70 - 99 mg/dL 133(H) 179(H) 179(H)  BUN 8 - 23 mg/dL 53(H) 32(H) 26(H)  Creatinine 0.61 - 1.24 mg/dL 2.15(H) 1.33(H) 1.15  Sodium 135 - 145 mmol/L 136 136 136  Potassium 3.5 - 5.1 mmol/L 4.4 4.4 4.5  Chloride 98 - 111 mmol/L 97(L) 97(L) 99  CO2 22 - 32 mmol/L 23 25 26   Calcium 8.9 - 10.3 mg/dL 9.0 9.1 8.7(L)  Total Protein 6.5 - 8.1 g/dL 7.7 8.0 -  Total Bilirubin 0.3 - 1.2 mg/dL 2.9(H) 1.8(H) -  Alkaline Phos 38 - 126 U/L 57 74 -  AST 15 - 41 U/L 28 74(H) -  ALT 0 - 44 U/L 30 51(H) -   CBC Latest Ref Rng & Units 10/01/2019 09/30/2019 09/29/2019  WBC 4.0 -  10.5 K/uL 18.2(H) 17.5(H) 9.7  Hemoglobin 13.0 - 17.0 g/dL 12.9(L) 13.4 12.8(L)  Hematocrit 39 - 52 % 38.6(L) 40.5 38.3(L)  Platelets 150 - 400 K/uL 105(L) 103(L) 83(L)     RADS: CLINICAL DATA:  Upper abdominal pain  EXAM: ABDOMEN ULTRASOUND COMPLETE  COMPARISON:  None.  FINDINGS: Gallbladder: There is sludge within the gallbladder. There are intermingled gallstones, largest measuring 1.2 cm in length. The gallbladder wall is thickened and edematous. There is equivocal pericholecystic fluid. No sonographic Murphy sign noted by sonographer.  Common bile duct: Diameter: 3 mm. No intrahepatic, common hepatic, or common bile duct dilatation.  Liver: No focal lesion identified. Liver echogenicity overall increased.  Portal vein is patent on color Doppler imaging with normal direction of blood flow towards the liver.  IVC: No abnormality visualized in regions which can be interrogated. Most of the inferior vena cava is obscured by gas.  Pancreas: Pancreas essentially completely obscured by gas.  Spleen: Size and appearance within normal limits.  Right Kidney: Length: 10.9 cm. Echogenicity within normal limits. No hydronephrosis visualized. There is a cyst arising from the mid right kidney measuring 1.7 x 1.4 x 1.4 cm.  Left Kidney: Length: 10.7 cm. Echogenicity within normal limits. No mass or hydronephrosis visualized.  Abdominal aorta: Distal aortic diameter measures 3.1 x 3.0 cm. No periaortic fluid.  Other findings: No evident ascites.  IMPRESSION: 1. Cholelithiasis with intermingled sludge. Gallbladder wall is thickened and edematous. Equivocal pericholecystic fluid. Appearance is concerning for a degree of acute cholecystitis.  2. Distal abdominal aortic diameter measures 3.1 x 3.0 cm. Per consensus guidelines, this finding warrants a follow-up ultrasound in 3 years to assess for stability.  3.  Pancreas and most of inferior vena cava obscured by gas.  4.  Cyst arising from mid right kidney measuring 1.7 x 1.4 x 1 4 cm.  These results will be called to the ordering clinician or representative by the Radiologist Assistant, and communication documented in the PACS or [employer].   Electronically Signed   By: Lowella Grip III M.D.   On: 10/01/2019 13:53  Assessment:      Acute cholecystitis A. fib on Coumadin History of MI Diabetes Hypertension  Plan:     84 year old male with multiple comorbidities as listed above with recent ER showing less than 15 % with moderate mitral regurg.  History of A. fib on Coumadin as well.  Patient is an extremely high risk candidate of perioperative complications if we proceed with a cholecystectomy at this time.   We will ask IR for possible cholecystostomy tube placement when they are available.  We will continue with n.p.o., IV fluids, IV antibiotics in the meantime.  Hold Coumadin, check INR, switch to heparin drip until tube placement is completed.  Sliding scale for glycemic control in the meantime.  We will place on telemetry for close monitoring as well.  All questions and concerns addressed with patient and wife.  I did mention this could potentially be a permanent solution, but will discuss this in further detail once the cholecystostomy tube is in place.  Initial bilirubin was elevated but was noted to be indirect hyperbilirubinemia, no concern for common bile duct obstruction at this time.  Also discussed with patient CODE STATUS and he stated that he will be DNR as noted in previous encounters.

## 2019-10-02 LAB — BASIC METABOLIC PANEL
Anion gap: 12 (ref 5–15)
BUN: 53 mg/dL — ABNORMAL HIGH (ref 8–23)
CO2: 24 mmol/L (ref 22–32)
Calcium: 8.1 mg/dL — ABNORMAL LOW (ref 8.9–10.3)
Chloride: 103 mmol/L (ref 98–111)
Creatinine, Ser: 1.59 mg/dL — ABNORMAL HIGH (ref 0.61–1.24)
GFR calc Af Amer: 46 mL/min — ABNORMAL LOW (ref >60)
GFR calc non Af Amer: 40 mL/min — ABNORMAL LOW (ref >60)
Glucose, Bld: 64 mg/dL — ABNORMAL LOW (ref 70–99)
Potassium: 3.9 mmol/L (ref 3.5–5.1)
Sodium: 139 mmol/L (ref 135–145)

## 2019-10-02 LAB — GLUCOSE, CAPILLARY
Glucose-Capillary: 57 mg/dL — ABNORMAL LOW (ref 70–99)
Glucose-Capillary: 60 mg/dL — ABNORMAL LOW (ref 70–99)
Glucose-Capillary: 70 mg/dL (ref 70–99)
Glucose-Capillary: 88 mg/dL (ref 70–99)
Glucose-Capillary: 117 mg/dL — ABNORMAL HIGH (ref 70–99)
Glucose-Capillary: 69 mg/dL — ABNORMAL LOW (ref 70–99)
Glucose-Capillary: 80 mg/dL (ref 70–99)
Glucose-Capillary: 102 mg/dL — ABNORMAL HIGH (ref 70–99)

## 2019-10-02 LAB — URINALYSIS, COMPLETE (UACMP) WITH MICROSCOPIC
Bacteria, UA: NONE SEEN
Bilirubin Urine: NEGATIVE
Glucose, UA: NEGATIVE mg/dL
Hgb urine dipstick: NEGATIVE
Ketones, ur: NEGATIVE mg/dL
Leukocytes,Ua: NEGATIVE
Nitrite: NEGATIVE
Protein, ur: NEGATIVE mg/dL
Specific Gravity, Urine: 1.019 (ref 1.005–1.030)
pH: 5 (ref 5.0–8.0)

## 2019-10-02 LAB — CBC
HCT: 34.1 % — ABNORMAL LOW (ref 39.0–52.0)
Hemoglobin: 11.2 g/dL — ABNORMAL LOW (ref 13.0–17.0)
MCH: 33 pg (ref 26.0–34.0)
MCHC: 32.8 g/dL (ref 30.0–36.0)
MCV: 100.6 fL — ABNORMAL HIGH (ref 80.0–100.0)
Platelets: 86 10*3/uL — ABNORMAL LOW (ref 150–400)
RBC: 3.39 MIL/uL — ABNORMAL LOW (ref 4.22–5.81)
RDW: 13 % (ref 11.5–15.5)
WBC: 14.7 10*3/uL — ABNORMAL HIGH (ref 4.0–10.5)
nRBC: 0 % (ref 0.0–0.2)

## 2019-10-02 LAB — HEPATIC FUNCTION PANEL
ALT: 25 U/L (ref 0–44)
AST: 26 U/L (ref 15–41)
Albumin: 2.7 g/dL — ABNORMAL LOW (ref 3.5–5.0)
Alkaline Phosphatase: 49 U/L (ref 38–126)
Bilirubin, Direct: 0.7 mg/dL — ABNORMAL HIGH (ref 0.0–0.2)
Indirect Bilirubin: 1.1 mg/dL — ABNORMAL HIGH (ref 0.3–0.9)
Total Bilirubin: 1.8 mg/dL — ABNORMAL HIGH (ref 0.3–1.2)
Total Protein: 6.3 g/dL — ABNORMAL LOW (ref 6.5–8.1)

## 2019-10-02 LAB — MAGNESIUM: Magnesium: 1.7 mg/dL (ref 1.7–2.4)

## 2019-10-02 LAB — PROTIME-INR
INR: 3 — ABNORMAL HIGH (ref 0.8–1.2)
Prothrombin Time: 30.1 seconds — ABNORMAL HIGH (ref 11.4–15.2)

## 2019-10-02 LAB — PHOSPHORUS: Phosphorus: 4 mg/dL (ref 2.5–4.6)

## 2019-10-02 MED ORDER — KCL IN DEXTROSE-NACL 40-5-0.45 MEQ/L-%-% IV SOLN
INTRAVENOUS | Status: DC
  Filled 2019-10-02 (×11): qty 1000

## 2019-10-02 MED ORDER — DEXTROSE 50 % IV SOLN
12.5000 g | INTRAVENOUS | Status: AC
  Administered 2019-10-02: 12.5 g via INTRAVENOUS

## 2019-10-02 NOTE — Progress Notes (Signed)
Subjective:  CC: Derrick Cochran is a 84 y.o. male  Hospital stay day 1,   acute choelcystitis  HPI: No issues overnight.  States pain is improving.  ROS:  General: Denies weight loss, weight gain, fatigue, fevers, chills, and night sweats. Heart: Denies chest pain, palpitations, racing heart, irregular heartbeat, leg pain or swelling, and decreased activity tolerance. Respiratory: Denies breathing difficulty, shortness of breath, wheezing, cough, and sputum. GI: Denies change in appetite, heartburn, nausea, vomiting, constipation, diarrhea, and blood in stool. GU: Denies difficulty urinating, pain with urinating, urgency, frequency, blood in urine.   Objective:   Temp:  [97.7 F (36.5 C)-98 F (36.7 C)] 98 F (36.7 C) (08/19 0908) Pulse Rate:  [65-74] 67 (08/19 0908) Resp:  [16-18] 18 (08/19 0908) BP: (109-126)/(48-104) 113/48 (08/19 0908) SpO2:  [93 %-97 %] 93 % (08/19 0908) Weight:  [71.7 kg] 71.7 kg (08/18 1602)     Height: 6' (182.9 cm) Weight: 71.7 kg BMI (Calculated): 21.42   Intake/Output this shift:   Intake/Output Summary (Last 24 hours) at 10/02/2019 1113 Last data filed at 10/01/2019 1914 Gross per 24 hour  Intake 500 ml  Output --  Net 500 ml    Constitutional :  alert, cooperative, appears stated age and no distress  Respiratory:  clear to auscultation bilaterally  Cardiovascular:  regular rate and rhythm  Gastrointestinal: soft, no guarding, but still siginificant focal TTP in RUQ.   Skin: Cool and moist.   Psychiatric: Normal affect, non-agitated, not confused       LABS:  CMP Latest Ref Rng & Units 10/02/2019 10/01/2019 09/30/2019  Glucose 70 - 99 mg/dL 64(L) 133(H) 179(H)  BUN 8 - 23 mg/dL 53(H) 53(H) 32(H)  Creatinine 0.61 - 1.24 mg/dL 1.59(H) 2.15(H) 1.33(H)  Sodium 135 - 145 mmol/L 139 136 136  Potassium 3.5 - 5.1 mmol/L 3.9 4.4 4.4  Chloride 98 - 111 mmol/L 103 97(L) 97(L)  CO2 22 - 32 mmol/L 24 23 25   Calcium 8.9 - 10.3 mg/dL 8.1(L) 9.0 9.1   Total Protein 6.5 - 8.1 g/dL 6.3(L) 7.7 8.0  Total Bilirubin 0.3 - 1.2 mg/dL 1.8(H) 2.9(H) 1.8(H)  Alkaline Phos 38 - 126 U/L 49 57 74  AST 15 - 41 U/L 26 28 74(H)  ALT 0 - 44 U/L 25 30 51(H)   CBC Latest Ref Rng & Units 10/02/2019 10/01/2019 09/30/2019  WBC 4.0 - 10.5 K/uL 14.7(H) 18.2(H) 17.5(H)  Hemoglobin 13.0 - 17.0 g/dL 11.2(L) 12.9(L) 13.4  Hematocrit 39 - 52 % 34.1(L) 38.6(L) 40.5  Platelets 150 - 400 K/uL 86(L) 105(L) 103(L)    RADS: n/a Assessment:   Acute cholecystitis.  Numbers improving but still tender.  Discussed with IR and will ideally like INR to be lower than 3.0.  Will recheck again tomorrow and see.  Vit K reversal maybe needed.  They also requested to hold on heparin drip for now.  Continue NPO due to persistent pain.  Will add d5 to fluids.  A. fib on Coumadin History of MI Diabetes Hypertension CPM

## 2019-10-02 NOTE — Plan of Care (Signed)
  Problem: Activity: Goal: Ability to return to normal activity level will improve to the fullest extent possible by discharge Outcome: Progressing

## 2019-10-02 NOTE — Consult Note (Signed)
[place] for Heparin  Indication: atrial fibrillation  Allergies  Allergen Reactions  . Cefdinir Other (See Comments)    nightmares  . Adhesive [Tape] Rash  . Sulfa Antibiotics Rash  . Sulfamethoxazole Hives, Itching and Rash    Patient Measurements: Height: 6' (182.9 cm) Weight: 71.7 kg (158 lb) IBW/kg (Calculated) : 77.6 Heparin Dosing Weight: 71.7 kg   Vital Signs: BP: 118/53 (08/19 0821) Pulse Rate: 67 (08/19 0821)  Labs: Recent Labs    09/29/19 1725 09/29/19 1725 09/30/19 1510 09/30/19 1510 10/01/19 1609 10/01/19 2031 10/01/19 2240 10/02/19 0600  HGB 12.8*   < > 13.4   < > 12.9*  --   --  11.2*  HCT 38.3*   < > 40.5  --  38.6*  --   --  34.1*  PLT 83*   < > 103*  --  105*  --   --  86*  APTT  --   --   --   --   --   --  41*  --   LABPROT  --   --   --   --   --  26.4*  --  30.1*  INR  --   --   --   --   --  2.5*  --  3.0*  CREATININE 1.15   < > 1.33*  --  2.15*  --   --  1.59*  TROPONINIHS 11  --   --   --   --   --   --   --    < > = values in this interval not displayed.    Estimated Creatinine Clearance: 36.3 mL/min (A) (by C-G formula based on SCr of 1.59 mg/dL (H)).   Medications:  Per d/w pharmacy technician, patient reports taking warfarin 3 mg MThFSaSu and 4 mg TuW. His last dose of warfarin was 8/17. Pharmacy was unable to reach patient's wife at this time.   Assessment: Derrick Cochran is a 84 y.o. male who is has a PMH significant for atrial fibrillation, CHF, hypothyrodium, and MI. Patient is being bridged with heparin while warfarin is being held for possible cholecystostomy tube placement.   Goal of Therapy:  Heparin level 0.3-0.7 units/ml Monitor platelets by anticoagulation protocol: Yes   Plan:  --INR 3 today which reflects a therapeutic INR at upper end of range. Will discontinue heparin drip. No need for heparin for bridge until INR falls below therapeutic range.  --Continue to monitor daily  INR to assess for appropriateness for re-starting heparin --CBC worse today but suspect this is related to hemodilution from fluid resuscitation   Benita Gutter 10/02/2019,8:34 AM

## 2019-10-02 NOTE — ED Notes (Signed)
Blood sugar 57. Per MD Tamala Julian start d5 at 24mL per hour. This RN initiated d5 drip at this time.

## 2019-10-03 ENCOUNTER — Other Ambulatory Visit: Payer: Medicare HMO

## 2019-10-03 LAB — BASIC METABOLIC PANEL
Anion gap: 6 (ref 5–15)
BUN: 47 mg/dL — ABNORMAL HIGH (ref 8–23)
CO2: 27 mmol/L (ref 22–32)
Calcium: 8 mg/dL — ABNORMAL LOW (ref 8.9–10.3)
Chloride: 108 mmol/L (ref 98–111)
Creatinine, Ser: 1.48 mg/dL — ABNORMAL HIGH (ref 0.61–1.24)
GFR calc Af Amer: 50 mL/min — ABNORMAL LOW (ref >60)
GFR calc non Af Amer: 43 mL/min — ABNORMAL LOW (ref >60)
Glucose, Bld: 126 mg/dL — ABNORMAL HIGH (ref 70–99)
Potassium: 4.4 mmol/L (ref 3.5–5.1)
Sodium: 141 mmol/L (ref 135–145)

## 2019-10-03 LAB — URINE CULTURE: Culture: <10000 — AB

## 2019-10-03 LAB — PHOSPHORUS: Phosphorus: 3 mg/dL (ref 2.5–4.6)

## 2019-10-03 LAB — GLUCOSE, CAPILLARY
Glucose-Capillary: 103 mg/dL — ABNORMAL HIGH (ref 70–99)
Glucose-Capillary: 119 mg/dL — ABNORMAL HIGH (ref 70–99)
Glucose-Capillary: 140 mg/dL — ABNORMAL HIGH (ref 70–99)
Glucose-Capillary: 133 mg/dL — ABNORMAL HIGH (ref 70–99)

## 2019-10-03 LAB — HEPATIC FUNCTION PANEL
ALT: 34 U/L (ref 0–44)
AST: 53 U/L — ABNORMAL HIGH (ref 15–41)
Albumin: 2.4 g/dL — ABNORMAL LOW (ref 3.5–5.0)
Alkaline Phosphatase: 51 U/L (ref 38–126)
Bilirubin, Direct: 0.5 mg/dL — ABNORMAL HIGH (ref 0.0–0.2)
Indirect Bilirubin: 0.8 mg/dL (ref 0.3–0.9)
Total Bilirubin: 1.3 mg/dL — ABNORMAL HIGH (ref 0.3–1.2)
Total Protein: 6 g/dL — ABNORMAL LOW (ref 6.5–8.1)

## 2019-10-03 LAB — PROTIME-INR
INR: 3.1 — ABNORMAL HIGH (ref 0.8–1.2)
Prothrombin Time: 30.9 seconds — ABNORMAL HIGH (ref 11.4–15.2)

## 2019-10-03 LAB — CBC
HCT: 33.2 % — ABNORMAL LOW (ref 39.0–52.0)
Hemoglobin: 10.7 g/dL — ABNORMAL LOW (ref 13.0–17.0)
MCH: 32.6 pg (ref 26.0–34.0)
MCHC: 32.2 g/dL (ref 30.0–36.0)
MCV: 101.2 fL — ABNORMAL HIGH (ref 80.0–100.0)
Platelets: 87 10*3/uL — ABNORMAL LOW (ref 150–400)
RBC: 3.28 MIL/uL — ABNORMAL LOW (ref 4.22–5.81)
RDW: 12.7 % (ref 11.5–15.5)
WBC: 9.5 10*3/uL (ref 4.0–10.5)
nRBC: 0 % (ref 0.0–0.2)

## 2019-10-03 LAB — MAGNESIUM: Magnesium: 1.7 mg/dL (ref 1.7–2.4)

## 2019-10-03 MED ORDER — PHYTONADIONE 5 MG PO TABS
2.5000 mg | ORAL_TABLET | Freq: Once | ORAL | Status: AC
  Administered 2019-10-03: 2.5 mg via ORAL
  Filled 2019-10-03: qty 1

## 2019-10-03 NOTE — Care Management Important Message (Signed)
Important Message  Patient Details  Name: Derrick Cochran MRN: [id number] Date of Birth: 1935/07/18   Medicare Important Message Given:  Yes     Dannette Barbara 10/03/2019, 11:23 AM

## 2019-10-03 NOTE — Progress Notes (Signed)
Subjective:  CC: Derrick Cochran is a 84 y.o. male  Hospital stay day 2,   acute choelcystitis  HPI: No issues overnight.   ROS:  General: Denies weight loss, weight gain, fatigue, fevers, chills, and night sweats. Heart: Denies chest pain, palpitations, racing heart, irregular heartbeat, leg pain or swelling, and decreased activity tolerance. Respiratory: Denies breathing difficulty, shortness of breath, wheezing, cough, and sputum. GI: Denies change in appetite, heartburn, nausea, vomiting, constipation, diarrhea, and blood in stool. GU: Denies difficulty urinating, pain with urinating, urgency, frequency, blood in urine.   Objective:   Temp:  [98.1 F (36.7 C)-98.4 F (36.9 C)] 98.4 F (36.9 C) (08/20 1120) Pulse Rate:  [55-68] 64 (08/20 1120) Resp:  [16-20] 16 (08/20 1120) BP: (107-123)/(35-76) 114/40 (08/20 1120) SpO2:  [92 %-95 %] 95 % (08/20 1120)     Height: 6' (182.9 cm) Weight: 71.7 kg BMI (Calculated): 21.42   Intake/Output this shift:   Intake/Output Summary (Last 24 hours) at 10/03/2019 1446 Last data filed at 10/03/2019 0900 Gross per 24 hour  Intake 0 ml  Output 700 ml  Net -700 ml    Constitutional :  alert, cooperative, appears stated age and no distress  Respiratory:  clear to auscultation bilaterally  Cardiovascular:  regular rate and rhythm  Gastrointestinal: soft, no guarding, but still siginificant focal TTP in RUQ.   Skin: Cool and moist.   Psychiatric: Normal affect, non-agitated, not confused       LABS:  CMP Latest Ref Rng & Units 10/03/2019 10/02/2019 10/01/2019  Glucose 70 - 99 mg/dL 126(H) 64(L) 133(H)  BUN 8 - 23 mg/dL 47(H) 53(H) 53(H)  Creatinine 0.61 - 1.24 mg/dL 1.48(H) 1.59(H) 2.15(H)  Sodium 135 - 145 mmol/L 141 139 136  Potassium 3.5 - 5.1 mmol/L 4.4 3.9 4.4  Chloride 98 - 111 mmol/L 108 103 97(L)  CO2 22 - 32 mmol/L 27 24 23   Calcium 8.9 - 10.3 mg/dL 8.0(L) 8.1(L) 9.0  Total Protein 6.5 - 8.1 g/dL 6.0(L) 6.3(L) 7.7  Total  Bilirubin 0.3 - 1.2 mg/dL 1.3(H) 1.8(H) 2.9(H)  Alkaline Phos 38 - 126 U/L 51 49 57  AST 15 - 41 U/L 53(H) 26 28  ALT 0 - 44 U/L 34 25 30   CBC Latest Ref Rng & Units 10/03/2019 10/02/2019 10/01/2019  WBC 4.0 - 10.5 K/uL 9.5 14.7(H) 18.2(H)  Hemoglobin 13.0 - 17.0 g/dL 10.7(L) 11.2(L) 12.9(L)  Hematocrit 39 - 52 % 33.2(L) 34.1(L) 38.6(L)  Platelets 150 - 400 K/uL 87(L) 86(L) 105(L)    RADS: n/a Assessment:   Acute cholecystitis.  Numbers improving but still tender.  Vit K given this am.  Will continue to trend INR and hopefully drops below 3 by Monday, when IR stated they can place choelcystostomy tube. They also requested to hold on heparin drip for now.  Tolerating clears.  Will advance as tolerated. May need to resume his other home meds, if glucose, bp starts to rise.  Continue D5 for now  A. fib on Coumadin History of MI Diabetes Hypertension CPM

## 2019-10-04 DIAGNOSIS — K810 Acute cholecystitis: Secondary | ICD-10-CM

## 2019-10-04 LAB — HEPATIC FUNCTION PANEL
ALT: 35 U/L (ref 0–44)
AST: 43 U/L — ABNORMAL HIGH (ref 15–41)
Albumin: 2.7 g/dL — ABNORMAL LOW (ref 3.5–5.0)
Alkaline Phosphatase: 62 U/L (ref 38–126)
Bilirubin, Direct: 0.5 mg/dL — ABNORMAL HIGH (ref 0.0–0.2)
Indirect Bilirubin: 0.9 mg/dL (ref 0.3–0.9)
Total Bilirubin: 1.4 mg/dL — ABNORMAL HIGH (ref 0.3–1.2)
Total Protein: 6.7 g/dL (ref 6.5–8.1)

## 2019-10-04 LAB — GLUCOSE, CAPILLARY
Glucose-Capillary: 101 mg/dL — ABNORMAL HIGH (ref 70–99)
Glucose-Capillary: 113 mg/dL — ABNORMAL HIGH (ref 70–99)
Glucose-Capillary: 117 mg/dL — ABNORMAL HIGH (ref 70–99)
Glucose-Capillary: 118 mg/dL — ABNORMAL HIGH (ref 70–99)
Glucose-Capillary: 143 mg/dL — ABNORMAL HIGH (ref 70–99)
Glucose-Capillary: 181 mg/dL — ABNORMAL HIGH (ref 70–99)

## 2019-10-04 LAB — PROTIME-INR
INR: 2.3 — ABNORMAL HIGH (ref 0.8–1.2)
Prothrombin Time: 24.6 seconds — ABNORMAL HIGH (ref 11.4–15.2)

## 2019-10-04 LAB — CBC
HCT: 35 % — ABNORMAL LOW (ref 39.0–52.0)
Hemoglobin: 12.1 g/dL — ABNORMAL LOW (ref 13.0–17.0)
MCH: 33.1 pg (ref 26.0–34.0)
MCHC: 34.6 g/dL (ref 30.0–36.0)
MCV: 95.6 fL (ref 80.0–100.0)
Platelets: 75 10*3/uL — ABNORMAL LOW (ref 150–400)
RBC: 3.66 MIL/uL — ABNORMAL LOW (ref 4.22–5.81)
RDW: 12.6 % (ref 11.5–15.5)
WBC: 9.5 10*3/uL (ref 4.0–10.5)
nRBC: 0 % (ref 0.0–0.2)

## 2019-10-04 LAB — PHOSPHORUS: Phosphorus: 2.6 mg/dL (ref 2.5–4.6)

## 2019-10-04 LAB — BASIC METABOLIC PANEL
Anion gap: 11 (ref 5–15)
BUN: 32 mg/dL — ABNORMAL HIGH (ref 8–23)
CO2: 26 mmol/L (ref 22–32)
Calcium: 8.2 mg/dL — ABNORMAL LOW (ref 8.9–10.3)
Chloride: 103 mmol/L (ref 98–111)
Creatinine, Ser: 1.24 mg/dL (ref 0.61–1.24)
GFR calc Af Amer: >60 mL/min (ref >60)
GFR calc non Af Amer: 54 mL/min — ABNORMAL LOW (ref >60)
Glucose, Bld: 117 mg/dL — ABNORMAL HIGH (ref 70–99)
Potassium: 4 mmol/L (ref 3.5–5.1)
Sodium: 140 mmol/L (ref 135–145)

## 2019-10-04 LAB — MAGNESIUM: Magnesium: 1.8 mg/dL (ref 1.7–2.4)

## 2019-10-04 NOTE — TOC Initial Note (Signed)
Transition of Care [facility]) - Initial/Assessment Note    Patient Details  Name: Derrick Cochran MRN: [id number] Date of Birth: [birth year]  [facility] CM/SW Contact:    Shelbie Hutching, RN Phone Number: 10/04/2019, 2:11 PM  Clinical Narrative:                 Patient admitted to the hospital with cholecystitis.  Patient will require a drain but his INR needs to be less than 3 before the procedure.  Plan is for IR to put the drain in on Monday.  Patient reports that he is not having any pain unless you press on his stomach.  Patient's wife is at the bedside and very supportive.  Wife, Derrick Cochran, is the patient's primary caregiver and she helps him with all ADL's, she reports that he can only stand for short periods of time.  Wife reports that they have all needed equipment, walker, wheelchair, handicap accessible bathroom, and ramp outside.  Wife provides transportation.  Patient is current with his PCP.  Wife and patient agree to have home health nursing at discharge for a short period of time, no preference on agency.  [place] with [facility] given home health referral for RN.   TOC team will be following through discharge.   Expected Discharge Plan: [place] Barriers to Discharge: Continued Medical Work up   Patient Goals and CMS Choice Patient states their goals for this hospitalization and ongoing recovery are:: To return home with medically ready after he gets his chole tube CMS Medicare.gov Compare Post Acute Care list provided to:: Patient Choice offered to / list presented to : Patient, Spouse  Expected Discharge Plan and Services Expected Discharge Plan: [place]   Discharge Planning Services: CM Consult Post Acute Care Choice: Home Health                             HH Arranged: RN [facility] Agency: [facility] [place] Date [facility] Agency Contacted: 10/04/19 Time [place] Agency Contacted: 63 Representative spoke with at [place]: Jana Half emailed info  Prior Living Arrangements/Services   Lives with:: Spouse Patient language and need for interpreter reviewed:: Yes Do you feel safe going back to the place where you live?: Yes      Need for Family Participation in Patient Care: Yes (Comment) (CHF, cholecystitis) Care giver support system in place?: Yes (comment) (wife) Current home services: DME Criminal Activity/Legal Involvement Pertinent to Current Situation/Hospitalization: No - Comment as needed  Activities of Daily Living Home Assistive Devices/Equipment: None ADL Screening (condition at time of admission) Patient's cognitive ability adequate to safely complete daily activities?: Yes Is the patient deaf or have difficulty hearing?: Yes Does the patient have difficulty seeing, even when wearing glasses/contacts?: No Does the patient have difficulty concentrating, remembering, or making decisions?: No Patient able to express need for assistance with ADLs?: Yes Does the patient have difficulty dressing or bathing?: No Independently performs ADLs?: Yes (appropriate for developmental age) Does the patient have difficulty walking or climbing stairs?: Yes Weakness of Legs: Both Weakness of Arms/Hands: Both  Permission Sought/Granted Permission sought to share information with : Case Manager, Family Supports, Other (comment) Permission granted to share information with : Yes, Verbal Permission Granted  Share Information with NAME: Derrick Cochran  Permission granted to share info w AGENCY: [employer]  Permission granted to share info w Relationship: wife     Emotional  Assessment Appearance:: Appears stated age Attitude/Demeanor/Rapport: Engaged Affect (typically observed): Accepting Orientation: : Oriented to Self, Oriented to Place, Oriented to  Time, Oriented to Situation Alcohol / Substance Use: Not Applicable Psych Involvement: No (comment)  Admission diagnosis:  Acute cholecystitis [K81.0] Cholecystitis  [K81.9] Patient Active Problem List   Diagnosis Date Noted  . Acute cholecystitis 10/01/2019  . CHF (congestive heart failure) ([place]) 09/23/2017  . AAA (abdominal aortic aneurysm) without rupture ([place]) 02/15/2016  . Palpitations 11/28/2015  . Dyspnea 11/28/2015  . Chronic systolic heart failure ([place]) 04/23/2015  . HTN (hypertension) 04/23/2015  . Diabetes ([place]) 04/23/2015  . Sick sinus syndrome ([place]) 07/03/2014  . Atrial fibrillation ([place]) 06/17/2014   PCP:  [name], MD Pharmacy:   [place], [place] - [address] [place] [address] Phone: [phone] Fax: [phone]     Social Determinants of Health (SDOH) Interventions    Readmission Risk Interventions No flowsheet data found.

## 2019-10-04 NOTE — Progress Notes (Addendum)
CC: Cholecystitis  Subjective: Feeling better.  He is hungry.  Taking clear liquid diet. No fevers no chills. Ultrasound personally reviewed showing evidence of his large on thickening gallbladder wall.  INR is 2.3.  Platelet count is 75,000 and hemoglobin is 12.  BUN is improving as well as white count  Objective: Vital signs in last 24 hours: Temp:  [97.4 F (36.3 C)-98.8 F (37.1 C)] 97.7 F (36.5 C) (08/21 1129) Pulse Rate:  [58-65] 62 (08/21 1129) Resp:  [14-21] 18 (08/21 1129) BP: (100-137)/(48-73) 110/71 (08/21 1129) SpO2:  [92 %-100 %] 97 % (08/21 1129) Last BM Date: 10/04/19  Intake/Output from previous day: 08/20 0701 - 08/21 0700 In: 7672.0 [P.O.:1272; I.V.:2367.9; IV Piggyback:254.3] Out: 1475 [Urine:1475] Intake/Output this shift: Total I/O In: 1098.6 [P.O.:660; I.V.:402.5; IV Piggyback:36.1] Out: 650 [Urine:650]  Physical exam: No acute distress.  Awake and alert. Abdomen: Soft nontender, no peritonitis,  no Murphy sign. Extremities: No edema well-perfused. Neuro: GCS 15, no focal deficits    Lab Results: CBC  Recent Labs    10/03/19 0410 10/04/19 0434  WBC 9.5 9.5  HGB 10.7* 12.1*  HCT 33.2* 35.0*  PLT 87* 75*   BMET Recent Labs    10/03/19 0410 10/04/19 0434  NA 141 140  K 4.4 4.0  CL 108 103  CO2 27 26  GLUCOSE 126* 117*  BUN 47* 32*  CREATININE 1.48* 1.24  CALCIUM 8.0* 8.2*   PT/INR Recent Labs    10/03/19 0410 10/04/19 0434  LABPROT 30.9* 24.6*  INR 3.1* 2.3*   ABG No results for input(s): PHART, HCO3 in the last 72 hours.  Invalid input(s): PCO2, PO2  Studies/Results: No results found.  Anti-infectives: Anti-infectives (From admission, onward)   Start     Dose/Rate Route Frequency Ordered Stop   10/01/19 2030  piperacillin-tazobactam (ZOSYN) IVPB 3.375 g        3.375 g 12.5 mL/hr over 240 Minutes Intravenous Every 8 hours 10/01/19 2027     10/01/19 1730  cefTRIAXone (ROCEPHIN) 2 g in sodium chloride 0.9 % 100 mL  IVPB        2 g 200 mL/hr over 30 Minutes Intravenous  Once 10/01/19 1720 10/01/19 1859   10/01/19 1730  metroNIDAZOLE (FLAGYL) IVPB 500 mg        500 mg 100 mL/hr over 60 Minutes Intravenous  Once 10/01/19 1720 10/01/19 2026      Assessment/Plan: 84 year old male with cholecystitis and significant comorbidities he continues to have an elevated INR.  Plan is for IR to place cholecystostomy tube on Monday.  We will keep trending INR daily.  His abdomen is currently benign and there is no need for surgical intervention. Please note I have a spent at least 35 minutes in this encounter with greater than 50% spent in coordination and counseling of patient care  Caroleen Hamman, MD, [facility]  10/04/2019

## 2019-10-05 LAB — CBC
HCT: 33.6 % — ABNORMAL LOW (ref 39.0–52.0)
Hemoglobin: 11.3 g/dL — ABNORMAL LOW (ref 13.0–17.0)
MCH: 33.3 pg (ref 26.0–34.0)
MCHC: 33.6 g/dL (ref 30.0–36.0)
MCV: 99.1 fL (ref 80.0–100.0)
Platelets: 109 10*3/uL — ABNORMAL LOW (ref 150–400)
RBC: 3.39 MIL/uL — ABNORMAL LOW (ref 4.22–5.81)
RDW: 12.5 % (ref 11.5–15.5)
WBC: 8.7 10*3/uL (ref 4.0–10.5)
nRBC: 0 % (ref 0.0–0.2)

## 2019-10-05 LAB — GLUCOSE, CAPILLARY
Glucose-Capillary: 116 mg/dL — ABNORMAL HIGH (ref 70–99)
Glucose-Capillary: 217 mg/dL — ABNORMAL HIGH (ref 70–99)
Glucose-Capillary: 81 mg/dL (ref 70–99)

## 2019-10-05 LAB — MAGNESIUM: Magnesium: 1.5 mg/dL — ABNORMAL LOW (ref 1.7–2.4)

## 2019-10-05 LAB — HEPATIC FUNCTION PANEL
ALT: 26 U/L (ref 0–44)
AST: 27 U/L (ref 15–41)
Albumin: 2.5 g/dL — ABNORMAL LOW (ref 3.5–5.0)
Alkaline Phosphatase: 65 U/L (ref 38–126)
Bilirubin, Direct: 0.4 mg/dL — ABNORMAL HIGH (ref 0.0–0.2)
Indirect Bilirubin: 0.8 mg/dL (ref 0.3–0.9)
Total Bilirubin: 1.2 mg/dL (ref 0.3–1.2)
Total Protein: 6.3 g/dL — ABNORMAL LOW (ref 6.5–8.1)

## 2019-10-05 LAB — BASIC METABOLIC PANEL
Anion gap: 11 (ref 5–15)
BUN: 21 mg/dL (ref 8–23)
CO2: 25 mmol/L (ref 22–32)
Calcium: 8.2 mg/dL — ABNORMAL LOW (ref 8.9–10.3)
Chloride: 100 mmol/L (ref 98–111)
Creatinine, Ser: 1.33 mg/dL — ABNORMAL HIGH (ref 0.61–1.24)
GFR calc Af Amer: 57 mL/min — ABNORMAL LOW (ref >60)
GFR calc non Af Amer: 49 mL/min — ABNORMAL LOW (ref >60)
Glucose, Bld: 127 mg/dL — ABNORMAL HIGH (ref 70–99)
Potassium: 3.8 mmol/L (ref 3.5–5.1)
Sodium: 136 mmol/L (ref 135–145)

## 2019-10-05 LAB — PHOSPHORUS: Phosphorus: 2.8 mg/dL (ref 2.5–4.6)

## 2019-10-05 LAB — PROTIME-INR
INR: 2 — ABNORMAL HIGH (ref 0.8–1.2)
Prothrombin Time: 21.9 seconds — ABNORMAL HIGH (ref 11.4–15.2)

## 2019-10-05 MED ORDER — VITAMIN K1 10 MG/ML IJ SOLN
5.0000 mg | Freq: Once | INTRAVENOUS | Status: AC
  Administered 2019-10-05: 5 mg via INTRAVENOUS
  Filled 2019-10-05: qty 0.5

## 2019-10-05 NOTE — Progress Notes (Signed)
CC: Cholecystitis Subjective: Feeling well.  No nausea no vomiting tolerating clear liquid diet.  INR is 2  Objective: Vital signs in last 24 hours: Temp:  [97.7 F (36.5 C)-98.7 F (37.1 C)] 97.7 F (36.5 C) (08/22 1218) Pulse Rate:  [54-83] 66 (08/22 1218) Resp:  [14-20] 20 (08/22 1218) BP: (97-124)/(49-60) 97/54 (08/22 1218) SpO2:  [88 %-98 %] 96 % (08/22 1218) Last BM Date: 10/04/19  Intake/Output from previous day: 08/21 0701 - 08/22 0700 In: 1098.6 [P.O.:660; I.V.:402.5; IV Piggyback:36.1] Out: 650 [Urine:650] Intake/Output this shift: Total I/O In: 1726.9 [I.V.:1513; IV Piggyback:213.8] Out: -   Physical exam:  NAD Alert Abd: soft, nt, no peritonitis, no Murphy Ext: no edema and well perfused  Lab Results: CBC  Recent Labs    10/04/19 0434 10/05/19 0603  WBC 9.5 8.7  HGB 12.1* 11.3*  HCT 35.0* 33.6*  PLT 75* 109*   BMET Recent Labs    10/04/19 0434 10/05/19 0603  NA 140 136  K 4.0 3.8  CL 103 100  CO2 26 25  GLUCOSE 117* 127*  BUN 32* 21  CREATININE 1.24 1.33*  CALCIUM 8.2* 8.2*   PT/INR Recent Labs    10/04/19 0434 10/05/19 0603  LABPROT 24.6* 21.9*  INR 2.3* 2.0*   ABG No results for input(s): PHART, HCO3 in the last 72 hours.  Invalid input(s): PCO2, PO2  Studies/Results: No results found.  Anti-infectives: Anti-infectives (From admission, onward)   Start     Dose/Rate Route Frequency Ordered Stop   10/01/19 2030  piperacillin-tazobactam (ZOSYN) IVPB 3.375 g        3.375 g 12.5 mL/hr over 240 Minutes Intravenous Every 8 hours 10/01/19 2027     10/01/19 1730  cefTRIAXone (ROCEPHIN) 2 g in sodium chloride 0.9 % 100 mL IVPB        2 g 200 mL/hr over 30 Minutes Intravenous  Once 10/01/19 1720 10/01/19 1859   10/01/19 1730  metroNIDAZOLE (FLAGYL) IVPB 500 mg        500 mg 100 mL/hr over 60 Minutes Intravenous  Once 10/01/19 1720 10/01/19 2026      Assessment/Plan:  Acute cholecystitis clinically resolving.  No need for  acute surgical intervention.  We will give another dose of vitamin K 5 mg today.  Repeat an INR in the morning.  Plan for cholecystostomy tube tomorrow pending INR.  Caroleen Hamman, MD, [facility]  10/05/2019

## 2019-10-06 ENCOUNTER — Inpatient Hospital Stay: Payer: Medicare HMO

## 2019-10-06 ENCOUNTER — Encounter: Payer: Self-pay | Admitting: Surgery

## 2019-10-06 LAB — BASIC METABOLIC PANEL
Anion gap: 11 (ref 5–15)
BUN: 21 mg/dL (ref 8–23)
CO2: 26 mmol/L (ref 22–32)
Calcium: 8.2 mg/dL — ABNORMAL LOW (ref 8.9–10.3)
Chloride: 100 mmol/L (ref 98–111)
Creatinine, Ser: 1.45 mg/dL — ABNORMAL HIGH (ref 0.61–1.24)
GFR calc Af Amer: 52 mL/min — ABNORMAL LOW (ref >60)
GFR calc non Af Amer: 45 mL/min — ABNORMAL LOW (ref >60)
Glucose, Bld: 145 mg/dL — ABNORMAL HIGH (ref 70–99)
Potassium: 4 mmol/L (ref 3.5–5.1)
Sodium: 137 mmol/L (ref 135–145)

## 2019-10-06 LAB — CBC
HCT: 32.2 % — ABNORMAL LOW (ref 39.0–52.0)
Hemoglobin: 10.6 g/dL — ABNORMAL LOW (ref 13.0–17.0)
MCH: 32.5 pg (ref 26.0–34.0)
MCHC: 32.9 g/dL (ref 30.0–36.0)
MCV: 98.8 fL (ref 80.0–100.0)
Platelets: 117 10*3/uL — ABNORMAL LOW (ref 150–400)
RBC: 3.26 MIL/uL — ABNORMAL LOW (ref 4.22–5.81)
RDW: 12.5 % (ref 11.5–15.5)
WBC: 9.6 10*3/uL (ref 4.0–10.5)
nRBC: 0 % (ref 0.0–0.2)

## 2019-10-06 LAB — HEPATIC FUNCTION PANEL
ALT: 21 U/L (ref 0–44)
AST: 21 U/L (ref 15–41)
Albumin: 2.4 g/dL — ABNORMAL LOW (ref 3.5–5.0)
Alkaline Phosphatase: 59 U/L (ref 38–126)
Bilirubin, Direct: 0.3 mg/dL — ABNORMAL HIGH (ref 0.0–0.2)
Indirect Bilirubin: 0.8 mg/dL (ref 0.3–0.9)
Total Bilirubin: 1.1 mg/dL (ref 0.3–1.2)
Total Protein: 6.2 g/dL — ABNORMAL LOW (ref 6.5–8.1)

## 2019-10-06 LAB — CULTURE, BLOOD (ROUTINE X 2)
Culture: NO GROWTH
Culture: NO GROWTH
Special Requests: ADEQUATE
Special Requests: ADEQUATE

## 2019-10-06 LAB — PROTIME-INR
INR: 1.4 — ABNORMAL HIGH (ref 0.8–1.2)
Prothrombin Time: 16.8 seconds — ABNORMAL HIGH (ref 11.4–15.2)

## 2019-10-06 LAB — GLUCOSE, CAPILLARY
Glucose-Capillary: 132 mg/dL — ABNORMAL HIGH (ref 70–99)
Glucose-Capillary: 112 mg/dL — ABNORMAL HIGH (ref 70–99)
Glucose-Capillary: 183 mg/dL — ABNORMAL HIGH (ref 70–99)
Glucose-Capillary: 167 mg/dL — ABNORMAL HIGH (ref 70–99)

## 2019-10-06 LAB — MAGNESIUM: Magnesium: 1.5 mg/dL — ABNORMAL LOW (ref 1.7–2.4)

## 2019-10-06 LAB — PHOSPHORUS: Phosphorus: 3.2 mg/dL (ref 2.5–4.6)

## 2019-10-06 MED ORDER — FENTANYL CITRATE (PF) 100 MCG/2ML IJ SOLN
INTRAMUSCULAR | Status: AC
  Filled 2019-10-06: qty 2

## 2019-10-06 MED ORDER — FENTANYL CITRATE (PF) 100 MCG/2ML IJ SOLN
INTRAMUSCULAR | Status: AC | PRN
Start: 2019-10-06 — End: 2019-10-06
  Administered 2019-10-06: 25 ug via INTRAVENOUS

## 2019-10-06 MED ORDER — MIDAZOLAM HCL 2 MG/2ML IJ SOLN
INTRAMUSCULAR | Status: AC | PRN
  Administered 2019-10-06: 0.5 mg via INTRAVENOUS

## 2019-10-06 MED ORDER — WARFARIN SODIUM 3 MG PO TABS
3.0000 mg | ORAL_TABLET | Freq: Every day | ORAL | Status: DC
  Administered 2019-10-06: 17:00:00 3 mg via ORAL
  Filled 2019-10-06 (×2): qty 1

## 2019-10-06 MED ORDER — SODIUM CHLORIDE 0.9% FLUSH
5.0000 mL | Freq: Three times a day (TID) | INTRAVENOUS | Status: DC
  Administered 2019-10-06: 5 mL

## 2019-10-06 MED ORDER — MIDAZOLAM HCL 2 MG/2ML IJ SOLN
INTRAMUSCULAR | Status: AC
  Filled 2019-10-06: qty 2

## 2019-10-06 MED ORDER — SODIUM CHLORIDE 0.9 % IV BOLUS
500.0000 mL | Freq: Once | INTRAVENOUS | Status: AC
  Administered 2019-10-06: 500 mL via INTRAVENOUS

## 2019-10-06 MED ORDER — WARFARIN - PHYSICIAN DOSING INPATIENT: Freq: Every day | Status: DC

## 2019-10-06 NOTE — Progress Notes (Signed)
Chief Complaint: Patient was seen in consultation today for perc chole drain  Referring Physician(s): Dr. Caroleen Hamman  Supervising Physician: Daryll Brod  Patient Status: [facility] - In-pt  History of Present Illness: Derrick Cochran is a 84 y.o. male admitted with abdominal pain and found to have calculous cholecystitis. Surgery consulted. Given his underlying cardiac comorbidities and acuity of the illness, he is determined to be high risk for surgery at this time. His Coumadin/INR has been reversed to 1.4 IR is asked to place perc chole drain. PMHx, meds, labs, imaging, allergies reviewed. Feels better since admission Has been NPO today as directed. Family at bedside.   Past Medical History:  Diagnosis Date  . AAA (abdominal aortic aneurysm) ([place])   . Arrhythmia    atrial fibrillation  . Arthritis   . BPH (benign prostatic hyperplasia)   . Cancer ([place])    skin  . CHF (congestive heart failure) ([place])   . Coronary artery disease   . DDD (degenerative disc disease), thoracic   . Diabetes mellitus without complication ([place])   . Esophageal stricture   . GERD (gastroesophageal reflux disease)   . Heart murmur   . Hyperlipidemia   . Hypertension   . Hypothyroidism   . Myocardial infarction ([place])   . Shortness of breath dyspnea     Past Surgical History:  Procedure Laterality Date  . AORTIC VALVE REPLACEMENT  1995   Bioprosthetic - [place]  . APPENDECTOMY    . CARDIAC CATHETERIZATION  1994 x 2 with PCTA of RCA  . CARDIAC VALVE REPLACEMENT     Bovine transcatheter heart valve  . COLONOSCOPY  2015  . CORONARY ARTERY BYPASS GRAFT  1995   x 4 Vessels - [facility]  . ELECTROPHYSIOLOGIC STUDY N/A 08/06/2014   Procedure: Cardioversion;  Surgeon: [name], MD;  Location: [facility];  Service: Cardiovascular;  Laterality: N/A;  . ELECTROPHYSIOLOGIC STUDY N/A 06/18/2014   Procedure: CARDIOVERSION;  Surgeon: [name], MD;  Location: [facility] OR[facility];   Service: Cardiovascular;  Laterality: N/A;  . ESOPHAGOGASTRODUODENOSCOPY  2014  . GREAT TOE ARTHRODESIS, INTERPHALANGEAL JOINT Left   . HERNIA REPAIR Left    Inguinal  . PACEMAKER INSERTION N/A 07/02/2014   Procedure: INSERTION PACEMAKER/DUAL CHAMBER  INITIAL IMPLANT;  Surgeon: [name], MD;  Location: [facility] ORS;  Service: Cardiovascular;  Laterality: N/A;  . SKIN BIOPSY      Allergies: Cefdinir, Adhesive [tape], Sulfa antibiotics, and Sulfamethoxazole  Medications:  Current Facility-Administered Medications:  .  acetaminophen (TYLENOL) tablet 500 mg, 500 mg, Oral, Q6H PRN, Sakai, Isami, DO, 500 mg at 10/03/19 1721 .  cyanocobalamin ((VITAMIN B-12)) injection 1,000 mcg, 1,000 mcg, Intramuscular, Q30 days, Sakai, Isami, DO .  dextrose 5 % and 0.45 % NaCl with KCl 40 mEq/L infusion, , Intravenous, Continuous, Pabon, Diego F, MD, Last Rate: 50 mL/hr at 10/06/19 0400, Rate Verify at 10/06/19 0400 .  docusate sodium (COLACE) capsule 100 mg, 100 mg, Oral, BID PRN, Sakai, Isami, DO .  enalapril (VASOTEC) tablet 5 mg, 5 mg, Oral, BID, Sakai, Isami, DO, 5 mg at 10/05/19 2144 .  furosemide (LASIX) tablet 20 mg, 20 mg, Oral, BID, Sakai, Isami, DO, 20 mg at 10/05/19 1717 .  gabapentin (NEURONTIN) capsule 300 mg, 300 mg, Oral, QHS, Sakai, Isami, DO, 300 mg at 10/05/19 2144 .  HYDROcodone-acetaminophen (NORCO/VICODIN) 5-325 MG per tablet 1-2 tablet, 1-2 tablet, Oral, Q4H PRN, Sakai, Isami, DO .  insulin aspart (novoLOG) injection 0-15 Units, 0-15 Units, Subcutaneous, TID  WC, Sakai, Isami, DO, 5 Units at 10/05/19 1240 .  levothyroxine (SYNTHROID) tablet 112 mcg, 112 mcg, Oral, QAC breakfast, Sakai, Isami, DO, 112 mcg at 10/05/19 0514 .  metoprolol succinate (TOPROL-XL) 24 hr tablet 25 mg, 25 mg, Oral, Daily, Sakai, Isami, DO, 25 mg at 10/05/19 0916 .  morphine 2 MG/ML injection 2 mg, 2 mg, Intravenous, Q4H PRN, Sakai, Isami, DO .  nitroGLYCERIN (NITROSTAT) SL tablet 0.4 mg, 0.4 mg, Sublingual,  Q5 min PRN, Sakai, Isami, DO .  ondansetron (ZOFRAN-ODT) disintegrating tablet 4 mg, 4 mg, Oral, Q6H PRN **OR** ondansetron (ZOFRAN) injection 4 mg, 4 mg, Intravenous, Q6H PRN, Sakai, Isami, DO .  pantoprazole (PROTONIX) EC tablet 40 mg, 40 mg, Oral, Daily, Sakai, Isami, DO, 40 mg at 10/05/19 0916 .  piperacillin-tazobactam (ZOSYN) IVPB 3.375 g, 3.375 g, Intravenous, Q8H, Sakai, Isami, DO, Last Rate: 12.5 mL/hr at 10/06/19 0518, 3.375 g at 10/06/19 0518 .  simvastatin (ZOCOR) tablet 40 mg, 40 mg, Oral, QPM, Sakai, Isami, DO, 40 mg at 10/05/19 1717 .  tamsulosin (FLOMAX) capsule 0.4 mg, 0.4 mg, Oral, q morning - 10a, Sakai, Isami, DO, 0.4 mg at 10/05/19 0916 .  temazepam (RESTORIL) capsule 30 mg, 30 mg, Oral, QHS, Sakai, Isami, DO, 30 mg at 10/05/19 2144 .  traMADol (ULTRAM) tablet 50 mg, 50 mg, Oral, Q6H PRN, Lysle Pearl, Isami, DO    Family History  Problem Relation Age of Onset  . Cancer Mother   . Lung cancer Father   . Kidney disease Sister   . Heart failure Brother     Social History   Socioeconomic History  . Marital status: Married    Spouse name: Not on file  . Number of children: Not on file  . Years of education: Not on file  . Highest education level: Not on file  Occupational History  . Occupation: retired  Tobacco Use  . Smoking status: Former Smoker    Packs/day: 0.50    Years: 15.00    Pack years: 7.50    Types: Cigarettes    Quit date: 04/22/1985    Years since quitting: 34.4  . Smokeless tobacco: Former Systems developer    Types: Chew  Substance and Sexual Activity  . Alcohol use: No  . Drug use: No  . Sexual activity: Not Currently  Other Topics Concern  . Not on file  Social History Narrative  . Not on file   Social Determinants of Health   Financial Resource Strain:   . Difficulty of Paying Living Expenses: Not on file  Food Insecurity:   . Worried About Charity fundraiser in the Last Year: Not on file  . Ran Out of Food in the Last Year: Not on file    Transportation Needs:   . Lack of Transportation (Medical): Not on file  . Lack of Transportation (Non-Medical): Not on file  Physical Activity:   . Days of Exercise per Week: Not on file  . Minutes of Exercise per Session: Not on file  Stress:   . Feeling of Stress : Not on file  Social Connections:   . Frequency of Communication with Friends and Family: Not on file  . Frequency of Social Gatherings with Friends and Family: Not on file  . Attends Religious Services: Not on file  . Active Member of Clubs or Organizations: Not on file  . Attends Archivist Meetings: Not on file  . Marital Status: Not on file     Review of Systems: A  12 point ROS discussed and pertinent positives are indicated in the HPI above.  All other systems are negative.  Review of Systems  Vital Signs: BP (!) 106/47 (BP Location: Left Arm)   Pulse 61   Temp 98.3 F (36.8 C) (Oral)   Resp 18   Ht 6' (1.829 m)   Wt 71.7 kg   SpO2 96%   BMI 21.43 kg/m   Physical Exam Constitutional:      Appearance: He is well-developed. He is not ill-appearing.  HENT:     Mouth/Throat:     Mouth: Mucous membranes are moist.     Pharynx: Oropharynx is clear.  Cardiovascular:     Rate and Rhythm: Normal rate. Rhythm irregular.     Heart sounds: Normal heart sounds.  Pulmonary:     Effort: Pulmonary effort is normal. No respiratory distress.     Breath sounds: Normal breath sounds.  Abdominal:     General: Abdomen is flat. There is no distension.     Palpations: Abdomen is soft.     Tenderness: There is no abdominal tenderness.  Skin:    General: Skin is warm and dry.     Coloration: Skin is not jaundiced.  Neurological:     General: No focal deficit present.     Mental Status: He is alert and oriented to person, place, and time.  Psychiatric:        Mood and Affect: Mood normal.        Thought Content: Thought content normal.        Judgment: Judgment normal.     Imaging: DG Chest 2  View  Result Date: 09/29/2019 CLINICAL DATA:  Chest pain, short of breath, right lower back pain EXAM: CHEST - 2 VIEW COMPARISON:  09/23/2017 FINDINGS: Frontal and lateral views of the chest demonstrates stable postsurgical changes from median sternotomy and aortic valve replacement. Dual lead pacer unchanged. Cardiac silhouette is stable. No airspace disease, effusion, or pneumothorax. No acute bony abnormalities. IMPRESSION: 1. No acute intrathoracic process. Electronically Signed   By: Randa Ngo M.D.   On: 09/29/2019 18:27   US Abdomen Complete  Result Date: 10/01/2019 CLINICAL DATA:  Upper abdominal pain EXAM: ABDOMEN ULTRASOUND COMPLETE COMPARISON:  None. FINDINGS: Gallbladder: There is sludge within the gallbladder. There are intermingled gallstones, largest measuring 1.2 cm in length. The gallbladder wall is thickened and edematous. There is equivocal pericholecystic fluid. No sonographic Murphy sign noted by sonographer. Common bile duct: Diameter: 3 mm. No intrahepatic, common hepatic, or common bile duct dilatation. Liver: No focal lesion identified. Liver echogenicity overall increased. Portal vein is patent on color Doppler imaging with normal direction of blood flow towards the liver. IVC: No abnormality visualized in regions which can be interrogated. Most of the inferior vena cava is obscured by gas. Pancreas: Pancreas essentially completely obscured by gas. Spleen: Size and appearance within normal limits. Right Kidney: Length: 10.9 cm. Echogenicity within normal limits. No hydronephrosis visualized. There is a cyst arising from the mid right kidney measuring 1.7 x 1.4 x 1.4 cm. Left Kidney: Length: 10.7 cm. Echogenicity within normal limits. No mass or hydronephrosis visualized. Abdominal aorta: Distal aortic diameter measures 3.1 x 3.0 cm. No periaortic fluid. Other findings: No evident ascites. IMPRESSION: 1. Cholelithiasis with intermingled sludge. Gallbladder wall is thickened and  edematous. Equivocal pericholecystic fluid. Appearance is concerning for a degree of acute cholecystitis. 2. Distal abdominal aortic diameter measures 3.1 x 3.0 cm. Per consensus guidelines, this finding warrants a follow-up  ultrasound in 3 years to assess for stability. 3.  Pancreas and most of inferior vena cava obscured by gas. 4.  Cyst arising from mid right kidney measuring 1.7 x 1.4 x 1 4 cm. These results will be called to the ordering clinician or representative by the Radiologist Assistant, and communication documented in the PACS or [employer]. Electronically Signed   By: Lowella Grip III M.D.   On: 10/01/2019 13:53    Labs:  CBC: Recent Labs    10/03/19 0410 10/04/19 0434 10/05/19 0603 10/06/19 0522  WBC 9.5 9.5 8.7 9.6  HGB 10.7* 12.1* 11.3* 10.6*  HCT 33.2* 35.0* 33.6* 32.2*  PLT 87* 75* 109* 117*    COAGS: Recent Labs    10/01/19 2240 10/02/19 0600 10/03/19 0410 10/04/19 0434 10/05/19 0603 10/06/19 0522  INR  --    < > 3.1* 2.3* 2.0* 1.4*  APTT 41*  --   --   --   --   --    < > = values in this interval not displayed.    BMP: Recent Labs    10/03/19 0410 10/04/19 0434 10/05/19 0603 10/06/19 0522  NA 141 140 136 137  K 4.4 4.0 3.8 4.0  CL 108 103 100 100  CO2 27 26 25 26   GLUCOSE 126* 117* 127* 145*  BUN 47* 32* 21 21  CALCIUM 8.0* 8.2* 8.2* 8.2*  CREATININE 1.48* 1.24 1.33* 1.45*  GFRNONAA 43* 54* 49* 45*  GFRAA 50* >60 57* 52*    LIVER FUNCTION TESTS: Recent Labs    10/03/19 0410 10/04/19 0434 10/05/19 0603 10/06/19 0522  BILITOT 1.3* 1.4* 1.2 1.1  AST 53* 43* 27 21  ALT 34 35 26 21  ALKPHOS 51 62 65 59  PROT 6.0* 6.7 6.3* 6.2*  ALBUMIN 2.4* 2.7* 2.5* 2.4*    TUMOR MARKERS: No results for input(s): AFPTM, CEA, CA199, CHROMGRNA in the last 8760 hours.  Assessment and Plan: Calculous cholecystitis For image guided perc chole drain Labs reviewed. Risks and benefits discussed with the patient including, but not limited to  bleeding, infection, gallbladder perforation, bile leak, sepsis or even death.  All of the patient's questions were answered, patient is agreeable to proceed. Consent signed and in chart.    Thank you for this interesting consult.  I greatly enjoyed meeting KHYLER ESCHMANN and look forward to participating in their care.  A copy of this report was sent to the requesting provider on this date.  Electronically Signed: Ascencion Dike, PA-C 10/06/2019, 9:45 AM   I spent a total of 20 minutes in face to face in clinical consultation, greater than 50% of which was counseling/coordinating care for perc chole

## 2019-10-06 NOTE — Procedures (Signed)
Interventional Radiology Procedure Note  Procedure: Derrick Cochran    Complications: None  Estimated Blood Loss:  min  Findings: 120cc exudative bile aspirated cx sent     Tamera Punt, MD

## 2019-10-06 NOTE — Progress Notes (Signed)
Patient clinically stable post Cholecystostomy tube 84fr placement per Dr Annamaria Boots. Tolerated well. Wife at bedside upon arrival post procedure/recovery. Awake/alert post procedure. Denies complaints at this time. 120 ml dark brown exudate removed with placement. Received Versed 0.5mg  along with Fentanyl 8mcg IV for procedure. Vitals stable pre and post procedure. Report given to Rowe Robert  Rn post recovery with questions answered.

## 2019-10-06 NOTE — Progress Notes (Signed)
Subjective:  CC: Derrick Cochran is a 84 y.o. male  Hospital stay day 5,   acute choelcystitis  HPI: No issues overnight. Drain placed this am.  ROS:  General: Denies weight loss, weight gain, fatigue, fevers, chills, and night sweats. Heart: Denies chest pain, palpitations, racing heart, irregular heartbeat, leg pain or swelling, and decreased activity tolerance. Respiratory: Denies breathing difficulty, shortness of breath, wheezing, cough, and sputum. GI: Denies change in appetite, heartburn, nausea, vomiting, constipation, diarrhea, and blood in stool. GU: Denies difficulty urinating, pain with urinating, urgency, frequency, blood in urine.   Objective:   Temp:  [97.4 F (36.3 C)-98.8 F (37.1 C)] 97.4 F (36.3 C) (08/23 1136) Pulse Rate:  [58-81] 58 (08/23 1136) Resp:  [16-25] 16 (08/23 1136) BP: (81-127)/(41-79) 81/41 (08/23 1136) SpO2:  [91 %-99 %] 91 % (08/23 1136)     Height: 6' (182.9 cm) Weight: 71.7 kg BMI (Calculated): 21.42   Intake/Output this shift:   Intake/Output Summary (Last 24 hours) at 10/06/2019 1213 Last data filed at 10/06/2019 5009 Gross per 24 hour  Intake 1244.8 ml  Output 900 ml  Net 344.8 ml    Constitutional :  alert, cooperative, appears stated age and no distress  Respiratory:  clear to auscultation bilaterally  Cardiovascular:  regular rate and rhythm  Gastrointestinal: soft, no guarding, but much improved focal TTP in RUQ.   Skin: Cool and moist.   Psychiatric: Normal affect, non-agitated, not confused       LABS:  CMP Latest Ref Rng & Units 10/06/2019 10/05/2019 10/04/2019  Glucose 70 - 99 mg/dL 145(H) 127(H) 117(H)  BUN 8 - 23 mg/dL 21 21 32(H)  Creatinine 0.61 - 1.24 mg/dL 1.45(H) 1.33(H) 1.24  Sodium 135 - 145 mmol/L 137 136 140  Potassium 3.5 - 5.1 mmol/L 4.0 3.8 4.0  Chloride 98 - 111 mmol/L 100 100 103  CO2 22 - 32 mmol/L 26 25 26   Calcium 8.9 - 10.3 mg/dL 8.2(L) 8.2(L) 8.2(L)  Total Protein 6.5 - 8.1 g/dL 6.2(L) 6.3(L) 6.7   Total Bilirubin 0.3 - 1.2 mg/dL 1.1 1.2 1.4(H)  Alkaline Phos 38 - 126 U/L 59 65 62  AST 15 - 41 U/L 21 27 43(H)  ALT 0 - 44 U/L 21 26 35   CBC Latest Ref Rng & Units 10/06/2019 10/05/2019 10/04/2019  WBC 4.0 - 10.5 K/uL 9.6 8.7 9.5  Hemoglobin 13.0 - 17.0 g/dL 10.6(L) 11.3(L) 12.1(L)  Hematocrit 39 - 52 % 32.2(L) 33.6(L) 35.0(L)  Platelets 150 - 400 K/uL 117(L) 109(L) 75(L)    RADS: n/a Assessment:   Acute cholecystitis.  S/p tube placement.  Will restart diet and coumadin, hopefully d/c tomorrow am.  Bolus given for asymptomatic BP in high 80s currently. Will continue to monitor   A. fib on Coumadin History of MI Diabetes Hypertension CPM

## 2019-10-06 NOTE — Care Management Important Message (Signed)
Important Message  Patient Details  Name: Derrick Cochran MRN: [id number] Date of Birth: 06/13/1935   Medicare Important Message Given:  Yes     Dannette Barbara 10/06/2019, 11:26 AM

## 2019-10-06 NOTE — Progress Notes (Signed)
Pt from  Radiology post  Rt stent placement.  Mews was  Yellow  While off floor  But green upon return to floor care.  Cont to moniter vs  More frequent  Due to orders. B/p soft  Notified md of  Blood pressure and  Orders for fluid  Orders given. B/p  Came up some but remains  Soft. Held b/p meds thie am prior to  Procedure. Continue to moniter vs and b/p.chg nurse aware .

## 2019-10-07 ENCOUNTER — Other Ambulatory Visit: Payer: Self-pay | Admitting: Surgery

## 2019-10-07 DIAGNOSIS — K819 Cholecystitis, unspecified: Secondary | ICD-10-CM

## 2019-10-07 LAB — BASIC METABOLIC PANEL
Anion gap: 12 (ref 5–15)
BUN: 19 mg/dL (ref 8–23)
CO2: 24 mmol/L (ref 22–32)
Calcium: 7.9 mg/dL — ABNORMAL LOW (ref 8.9–10.3)
Chloride: 101 mmol/L (ref 98–111)
Creatinine, Ser: 1.55 mg/dL — ABNORMAL HIGH (ref 0.61–1.24)
GFR calc Af Amer: 48 mL/min — ABNORMAL LOW (ref >60)
GFR calc non Af Amer: 41 mL/min — ABNORMAL LOW (ref >60)
Glucose, Bld: 194 mg/dL — ABNORMAL HIGH (ref 70–99)
Potassium: 3.8 mmol/L (ref 3.5–5.1)
Sodium: 137 mmol/L (ref 135–145)

## 2019-10-07 LAB — HEPATIC FUNCTION PANEL
ALT: 20 U/L (ref 0–44)
AST: 22 U/L (ref 15–41)
Albumin: 2.2 g/dL — ABNORMAL LOW (ref 3.5–5.0)
Alkaline Phosphatase: 59 U/L (ref 38–126)
Bilirubin, Direct: 0.2 mg/dL (ref 0.0–0.2)
Indirect Bilirubin: 0.5 mg/dL (ref 0.3–0.9)
Total Bilirubin: 0.7 mg/dL (ref 0.3–1.2)
Total Protein: 5.6 g/dL — ABNORMAL LOW (ref 6.5–8.1)

## 2019-10-07 LAB — PROTIME-INR
INR: 1.4 — ABNORMAL HIGH (ref 0.8–1.2)
Prothrombin Time: 16.4 seconds — ABNORMAL HIGH (ref 11.4–15.2)

## 2019-10-07 LAB — PHOSPHORUS: Phosphorus: 2.6 mg/dL (ref 2.5–4.6)

## 2019-10-07 LAB — MAGNESIUM: Magnesium: 1.5 mg/dL — ABNORMAL LOW (ref 1.7–2.4)

## 2019-10-07 LAB — GLUCOSE, CAPILLARY
Glucose-Capillary: 128 mg/dL — ABNORMAL HIGH (ref 70–99)
Glucose-Capillary: 126 mg/dL — ABNORMAL HIGH (ref 70–99)

## 2019-10-07 MED ORDER — SODIUM CHLORIDE 0.9% FLUSH: 5.0000 mL | Freq: Three times a day (TID) | INTRAVENOUS | 0 refills | Status: AC

## 2019-10-07 MED ORDER — MAGNESIUM SULFATE 2 GM/50ML IV SOLN
2.0000 g | Freq: Once | INTRAVENOUS | Status: AC
  Administered 2019-10-07: 09:00:00 2 g via INTRAVENOUS
  Filled 2019-10-07: qty 50

## 2019-10-07 NOTE — Discharge Summary (Addendum)
Physician Discharge Summary  Patient ID: Derrick Cochran MRN: [id number] DOB/AGE: 17-Nov-1935 84 y.o.  Admit date: 10/01/2019 Discharge date: 10/07/19  Admission Diagnoses: acute cholecystitis  Discharge Diagnoses:  Same as above  Discharged Condition: good  Hospital Course: admitted for above.  Too high risk for surgery, so IR guided cholecystostomy drain placed after INR dropped below safe level.  At time of discharge, pain controlled, drain in place, tolerating diet.  Will d/c and f/u as outpt re: best timing of possible drain removal, understanding this maybe a permanent solution.  Derrick Cochran will need to followup with radiology regarding any drain care.  Will continue TID flush with 50ml NS as started inpt.  Consults: IR  Discharge Exam: Blood pressure (!) 95/34, pulse (!) 59, temperature 98.1 F (36.7 C), resp. rate 17, height 6' (1.829 m), weight 71.7 kg, SpO2 98 %. General appearance: alert, cooperative and no distress GI: soft, no guarding.  pain around drain insertion site, but RUQ pain on initial presentation has resolved  Disposition:  Discharge disposition: 01-Home or Self Care       Discharge Instructions    Discharge patient   Complete by: As directed    Discharge disposition: 01-Home or Self Care   Discharge patient date: 10/07/2019     Allergies as of 10/07/2019      Reactions   Cefdinir Other (See Comments)   nightmares   Adhesive [tape] Rash   Sulfa Antibiotics Rash   Sulfamethoxazole Hives, Itching, Rash      Medication List    TAKE these medications   acetaminophen 500 MG tablet Commonly known as: TYLENOL Take 500 mg by mouth every 6 (six) hours as needed for mild pain, moderate pain or fever.   enalapril 5 MG tablet Commonly known as: VASOTEC Take 1 tablet (5 mg total) by mouth daily.   furosemide 20 MG tablet Commonly known as: LASIX Take 1 tablet (20 mg total) by mouth 2 (two) times daily.   gabapentin 300 MG capsule Commonly known as:  NEURONTIN Take 300 mg by mouth at bedtime.   glipiZIDE 5 MG 24 hr tablet Commonly known as: GLUCOTROL XL Take 5 mg by mouth every morning.   levocetirizine 5 MG tablet Commonly known as: XYZAL Take 5 mg by mouth every evening.   levothyroxine 150 MCG tablet Commonly known as: SYNTHROID Take 150 mcg by mouth daily before breakfast.   metFORMIN 500 MG tablet Commonly known as: GLUCOPHAGE Take 500 mg by mouth 2 (two) times daily.   metoprolol succinate 50 MG 24 hr tablet Commonly known as: TOPROL-XL Take 50 mg by mouth daily.   nitroGLYCERIN 0.4 MG SL tablet Commonly known as: NITROSTAT Place 0.4 mg under the tongue every 5 (five) minutes as needed for chest pain.   omeprazole 20 MG capsule Commonly known as: PRILOSEC Take 20 mg by mouth every morning.   simvastatin 40 MG tablet Commonly known as: ZOCOR Take 40 mg by mouth every evening.   tamsulosin 0.4 MG Caps capsule Commonly known as: FLOMAX Take 0.4 mg by mouth every morning.   temazepam 30 MG capsule Commonly known as: RESTORIL Take 30-60 mg by mouth at bedtime as needed for sleep.   warfarin 3 MG tablet Commonly known as: COUMADIN Take 3 mg by mouth See admin instructions. Take 1 tablet (3mg ) every evening - take with 1mg  dose on Tuesday and Wednesday   warfarin 1 MG tablet Commonly known as: COUMADIN Take 1 mg by mouth See admin instructions. Take 1  tablet (1mg ) with 3mg  dose on Tuesday and Wednesday evening       Follow-up Information    [place], Dianna Ewald, DO Follow up in 2 week(s).   Specialty: Surgery Why: f/u hospital stay for cholecystitis Contact information: [place] [address] [phone]        [name], MD Follow up.   Specialty: Cardiology Why: s/p hospitalization and possible need for coumdain adjustment Contact information: [facility] [place] [address] [place] Follow up in 4 day(s).   Why: followup cholecystostomy tube placement Contact information: [place] [address] [phone]                Total time spent arranging discharge was >8min. Signed: Benjamine Sprague 10/07/2019, 7:46 AM

## 2019-10-07 NOTE — Discharge Instructions (Signed)
Ok to shower as needed from surgery standpoint.  Please follow instructions from radiology department regarding specific drain care.  Continue flushing catheter with 38ml of normal saline every 8hrs until then  Avoid fatty foods and oil as much as possible to prevent another gallbladder attack

## 2019-10-07 NOTE — Progress Notes (Signed)
Reviewed AVS with pt. Educated pt on how to flush drain. Wife returned demonstration.

## 2019-10-09 DIAGNOSIS — I255 Ischemic cardiomyopathy: Secondary | ICD-10-CM | POA: Diagnosis not present

## 2019-10-09 DIAGNOSIS — I5022 Chronic systolic (congestive) heart failure: Secondary | ICD-10-CM | POA: Diagnosis not present

## 2019-10-09 DIAGNOSIS — I110 Hypertensive heart disease with heart failure: Secondary | ICD-10-CM | POA: Diagnosis not present

## 2019-10-09 DIAGNOSIS — M47816 Spondylosis without myelopathy or radiculopathy, lumbar region: Secondary | ICD-10-CM | POA: Diagnosis not present

## 2019-10-09 DIAGNOSIS — I350 Nonrheumatic aortic (valve) stenosis: Secondary | ICD-10-CM | POA: Diagnosis not present

## 2019-10-09 DIAGNOSIS — K819 Cholecystitis, unspecified: Secondary | ICD-10-CM | POA: Diagnosis not present

## 2019-10-09 DIAGNOSIS — I482 Chronic atrial fibrillation, unspecified: Secondary | ICD-10-CM | POA: Diagnosis not present

## 2019-10-09 DIAGNOSIS — E119 Type 2 diabetes mellitus without complications: Secondary | ICD-10-CM | POA: Diagnosis not present

## 2019-10-09 DIAGNOSIS — Z434 Encounter for attention to other artificial openings of digestive tract: Secondary | ICD-10-CM | POA: Diagnosis not present

## 2019-10-11 LAB — AEROBIC/ANAEROBIC CULTURE W GRAM STAIN (SURGICAL/DEEP WOUND): Culture: NO GROWTH

## 2019-10-13 DIAGNOSIS — I482 Chronic atrial fibrillation, unspecified: Secondary | ICD-10-CM | POA: Diagnosis not present

## 2019-10-13 DIAGNOSIS — I255 Ischemic cardiomyopathy: Secondary | ICD-10-CM | POA: Diagnosis not present

## 2019-10-13 DIAGNOSIS — E119 Type 2 diabetes mellitus without complications: Secondary | ICD-10-CM | POA: Diagnosis not present

## 2019-10-13 DIAGNOSIS — I350 Nonrheumatic aortic (valve) stenosis: Secondary | ICD-10-CM | POA: Diagnosis not present

## 2019-10-13 DIAGNOSIS — I110 Hypertensive heart disease with heart failure: Secondary | ICD-10-CM | POA: Diagnosis not present

## 2019-10-13 DIAGNOSIS — I5022 Chronic systolic (congestive) heart failure: Secondary | ICD-10-CM | POA: Diagnosis not present

## 2019-10-13 DIAGNOSIS — K819 Cholecystitis, unspecified: Secondary | ICD-10-CM | POA: Diagnosis not present

## 2019-10-13 DIAGNOSIS — Z434 Encounter for attention to other artificial openings of digestive tract: Secondary | ICD-10-CM | POA: Diagnosis not present

## 2019-10-13 DIAGNOSIS — M47816 Spondylosis without myelopathy or radiculopathy, lumbar region: Secondary | ICD-10-CM | POA: Diagnosis not present

## 2019-10-14 DIAGNOSIS — I482 Chronic atrial fibrillation, unspecified: Secondary | ICD-10-CM | POA: Diagnosis not present

## 2019-10-14 DIAGNOSIS — I5022 Chronic systolic (congestive) heart failure: Secondary | ICD-10-CM | POA: Diagnosis not present

## 2019-10-14 DIAGNOSIS — I110 Hypertensive heart disease with heart failure: Secondary | ICD-10-CM | POA: Diagnosis not present

## 2019-10-14 DIAGNOSIS — K819 Cholecystitis, unspecified: Secondary | ICD-10-CM | POA: Diagnosis not present

## 2019-10-14 DIAGNOSIS — E119 Type 2 diabetes mellitus without complications: Secondary | ICD-10-CM | POA: Diagnosis not present

## 2019-10-14 DIAGNOSIS — M47816 Spondylosis without myelopathy or radiculopathy, lumbar region: Secondary | ICD-10-CM | POA: Diagnosis not present

## 2019-10-14 DIAGNOSIS — Z434 Encounter for attention to other artificial openings of digestive tract: Secondary | ICD-10-CM | POA: Diagnosis not present

## 2019-10-15 DIAGNOSIS — I255 Ischemic cardiomyopathy: Secondary | ICD-10-CM | POA: Diagnosis not present

## 2019-10-15 DIAGNOSIS — I350 Nonrheumatic aortic (valve) stenosis: Secondary | ICD-10-CM | POA: Diagnosis not present

## 2019-10-15 DIAGNOSIS — I110 Hypertensive heart disease with heart failure: Secondary | ICD-10-CM | POA: Diagnosis not present

## 2019-10-15 DIAGNOSIS — M47816 Spondylosis without myelopathy or radiculopathy, lumbar region: Secondary | ICD-10-CM | POA: Diagnosis not present

## 2019-10-15 DIAGNOSIS — Z434 Encounter for attention to other artificial openings of digestive tract: Secondary | ICD-10-CM | POA: Diagnosis not present

## 2019-10-15 DIAGNOSIS — I5022 Chronic systolic (congestive) heart failure: Secondary | ICD-10-CM | POA: Diagnosis not present

## 2019-10-15 DIAGNOSIS — K819 Cholecystitis, unspecified: Secondary | ICD-10-CM | POA: Diagnosis not present

## 2019-10-15 DIAGNOSIS — I482 Chronic atrial fibrillation, unspecified: Secondary | ICD-10-CM | POA: Diagnosis not present

## 2019-10-15 DIAGNOSIS — E119 Type 2 diabetes mellitus without complications: Secondary | ICD-10-CM | POA: Diagnosis not present

## 2019-10-21 DIAGNOSIS — I4821 Permanent atrial fibrillation: Secondary | ICD-10-CM | POA: Diagnosis not present

## 2019-10-21 DIAGNOSIS — I482 Chronic atrial fibrillation, unspecified: Secondary | ICD-10-CM | POA: Diagnosis not present

## 2019-10-21 DIAGNOSIS — K810 Acute cholecystitis: Secondary | ICD-10-CM | POA: Diagnosis not present

## 2019-10-21 DIAGNOSIS — I214 Non-ST elevation (NSTEMI) myocardial infarction: Secondary | ICD-10-CM | POA: Diagnosis not present

## 2019-10-21 DIAGNOSIS — I5022 Chronic systolic (congestive) heart failure: Secondary | ICD-10-CM | POA: Diagnosis not present

## 2019-10-21 DIAGNOSIS — I495 Sick sinus syndrome: Secondary | ICD-10-CM | POA: Diagnosis not present

## 2019-10-21 DIAGNOSIS — I714 Abdominal aortic aneurysm, without rupture: Secondary | ICD-10-CM | POA: Diagnosis not present

## 2019-10-21 DIAGNOSIS — I2119 ST elevation (STEMI) myocardial infarction involving other coronary artery of inferior wall: Secondary | ICD-10-CM | POA: Diagnosis not present

## 2019-10-21 DIAGNOSIS — I350 Nonrheumatic aortic (valve) stenosis: Secondary | ICD-10-CM | POA: Diagnosis not present

## 2019-10-21 DIAGNOSIS — I10 Essential (primary) hypertension: Secondary | ICD-10-CM | POA: Diagnosis not present

## 2019-10-23 DIAGNOSIS — I110 Hypertensive heart disease with heart failure: Secondary | ICD-10-CM | POA: Diagnosis not present

## 2019-10-23 DIAGNOSIS — K819 Cholecystitis, unspecified: Secondary | ICD-10-CM | POA: Diagnosis not present

## 2019-10-23 DIAGNOSIS — Z434 Encounter for attention to other artificial openings of digestive tract: Secondary | ICD-10-CM | POA: Diagnosis not present

## 2019-10-23 DIAGNOSIS — I5022 Chronic systolic (congestive) heart failure: Secondary | ICD-10-CM | POA: Diagnosis not present

## 2019-10-23 DIAGNOSIS — I482 Chronic atrial fibrillation, unspecified: Secondary | ICD-10-CM | POA: Diagnosis not present

## 2019-10-23 DIAGNOSIS — M47816 Spondylosis without myelopathy or radiculopathy, lumbar region: Secondary | ICD-10-CM | POA: Diagnosis not present

## 2019-10-23 DIAGNOSIS — E119 Type 2 diabetes mellitus without complications: Secondary | ICD-10-CM | POA: Diagnosis not present

## 2019-10-23 DIAGNOSIS — I350 Nonrheumatic aortic (valve) stenosis: Secondary | ICD-10-CM | POA: Diagnosis not present

## 2019-10-23 DIAGNOSIS — I255 Ischemic cardiomyopathy: Secondary | ICD-10-CM | POA: Diagnosis not present

## 2019-10-30 DIAGNOSIS — E119 Type 2 diabetes mellitus without complications: Secondary | ICD-10-CM | POA: Diagnosis not present

## 2019-10-30 DIAGNOSIS — I482 Chronic atrial fibrillation, unspecified: Secondary | ICD-10-CM | POA: Diagnosis not present

## 2019-10-30 DIAGNOSIS — I350 Nonrheumatic aortic (valve) stenosis: Secondary | ICD-10-CM | POA: Diagnosis not present

## 2019-10-30 DIAGNOSIS — M47816 Spondylosis without myelopathy or radiculopathy, lumbar region: Secondary | ICD-10-CM | POA: Diagnosis not present

## 2019-10-30 DIAGNOSIS — Z434 Encounter for attention to other artificial openings of digestive tract: Secondary | ICD-10-CM | POA: Diagnosis not present

## 2019-10-30 DIAGNOSIS — K819 Cholecystitis, unspecified: Secondary | ICD-10-CM | POA: Diagnosis not present

## 2019-10-30 DIAGNOSIS — I255 Ischemic cardiomyopathy: Secondary | ICD-10-CM | POA: Diagnosis not present

## 2019-10-30 DIAGNOSIS — I110 Hypertensive heart disease with heart failure: Secondary | ICD-10-CM | POA: Diagnosis not present

## 2019-10-30 DIAGNOSIS — I5022 Chronic systolic (congestive) heart failure: Secondary | ICD-10-CM | POA: Diagnosis not present

## 2019-11-03 DIAGNOSIS — M47816 Spondylosis without myelopathy or radiculopathy, lumbar region: Secondary | ICD-10-CM | POA: Diagnosis not present

## 2019-11-03 DIAGNOSIS — K819 Cholecystitis, unspecified: Secondary | ICD-10-CM | POA: Diagnosis not present

## 2019-11-03 DIAGNOSIS — I5022 Chronic systolic (congestive) heart failure: Secondary | ICD-10-CM | POA: Diagnosis not present

## 2019-11-03 DIAGNOSIS — I255 Ischemic cardiomyopathy: Secondary | ICD-10-CM | POA: Diagnosis not present

## 2019-11-03 DIAGNOSIS — I480 Paroxysmal atrial fibrillation: Secondary | ICD-10-CM | POA: Diagnosis not present

## 2019-11-03 DIAGNOSIS — I482 Chronic atrial fibrillation, unspecified: Secondary | ICD-10-CM | POA: Diagnosis not present

## 2019-11-03 DIAGNOSIS — E119 Type 2 diabetes mellitus without complications: Secondary | ICD-10-CM | POA: Diagnosis not present

## 2019-11-03 DIAGNOSIS — I110 Hypertensive heart disease with heart failure: Secondary | ICD-10-CM | POA: Diagnosis not present

## 2019-11-03 DIAGNOSIS — Z434 Encounter for attention to other artificial openings of digestive tract: Secondary | ICD-10-CM | POA: Diagnosis not present

## 2019-11-03 DIAGNOSIS — I350 Nonrheumatic aortic (valve) stenosis: Secondary | ICD-10-CM | POA: Diagnosis not present

## 2019-11-04 ENCOUNTER — Encounter: Payer: Self-pay | Admitting: Radiology

## 2019-11-04 ENCOUNTER — Ambulatory Visit
Admission: RE | Admit: 2019-11-04 | Discharge: 2019-11-04 | Disposition: A | Discharge status: Home / Self Care | Payer: Medicare HMO | Source: Ambulatory Visit | Attending: Surgery | Admitting: Surgery

## 2019-11-04 DIAGNOSIS — Z978 Presence of other specified devices: Secondary | ICD-10-CM | POA: Diagnosis not present

## 2019-11-04 DIAGNOSIS — K8000 Calculus of gallbladder with acute cholecystitis without obstruction: Secondary | ICD-10-CM | POA: Diagnosis not present

## 2019-11-04 DIAGNOSIS — K819 Cholecystitis, unspecified: Secondary | ICD-10-CM

## 2019-11-04 DIAGNOSIS — K810 Acute cholecystitis: Secondary | ICD-10-CM | POA: Diagnosis not present

## 2019-11-04 HISTORY — PX: IR RADIOLOGIST EVAL & MGMT: IMG5224

## 2019-11-04 NOTE — Progress Notes (Signed)
Chief Complaint: Patient was seen in consultation today for follow up perc chole drain  Referring Physician(s): Sakai,Isami  History of Present Illness: Derrick Cochran is a 84 y.o. male who developed acute calculous cholecystitis. He was determined to be a high risk surgical candidate at the time and therefore perc chole drain was put in on 8/23. He has seen his surgeon and is doing well overall. States he is tentatively scheduled for surgery on 9/8 He's here today for chole tube follow up. PMHx, meds, labs, imaging, allergies reviewed. Family at bedside.   Past Medical History:  Diagnosis Date  . AAA (abdominal aortic aneurysm) ([place])   . Arrhythmia    atrial fibrillation  . Arthritis   . BPH (benign prostatic hyperplasia)   . Cancer ([place])    skin  . CHF (congestive heart failure) ([place])   . Coronary artery disease   . DDD (degenerative disc disease), thoracic   . Diabetes mellitus without complication ([place])   . Esophageal stricture   . GERD (gastroesophageal reflux disease)   . Heart murmur   . Hyperlipidemia   . Hypertension   . Hypothyroidism   . Myocardial infarction ([place])   . Shortness of breath dyspnea     Past Surgical History:  Procedure Laterality Date  . AORTIC VALVE REPLACEMENT  1995   Bioprosthetic - [place]  . APPENDECTOMY    . CARDIAC CATHETERIZATION  1994 x 2 with PCTA of RCA  . CARDIAC VALVE REPLACEMENT     Bovine transcatheter heart valve  . COLONOSCOPY  2015  . CORONARY ARTERY BYPASS GRAFT  1995   x 4 Vessels - [facility]  . ELECTROPHYSIOLOGIC STUDY N/A 08/06/2014   Procedure: Cardioversion;  Surgeon: [name], MD;  Location: [facility];  Service: Cardiovascular;  Laterality: N/A;  . ELECTROPHYSIOLOGIC STUDY N/A 06/18/2014   Procedure: CARDIOVERSION;  Surgeon: [name], MD;  Location: [facility];  Service: Cardiovascular;  Laterality: N/A;  . ESOPHAGOGASTRODUODENOSCOPY  2014  . GREAT TOE ARTHRODESIS, INTERPHALANGEAL  JOINT Left   . HERNIA REPAIR Left    Inguinal  . IR RADIOLOGIST EVAL & MGMT  11/04/2019  . PACEMAKER INSERTION N/A 07/02/2014   Procedure: INSERTION PACEMAKER/DUAL CHAMBER  INITIAL IMPLANT;  Surgeon: [name], MD;  Location: [facility];  Service: Cardiovascular;  Laterality: N/A;  . SKIN BIOPSY      Allergies: Cefdinir, Adhesive [tape], Sulfa antibiotics, and Sulfamethoxazole  Medications: Prior to Admission medications   Medication Sig Start Date End Date Taking? Authorizing Provider  acetaminophen (TYLENOL) 500 MG tablet Take 500 mg by mouth every 6 (six) hours as needed for mild pain, moderate pain or fever.     [provider]  enalapril (VASOTEC) 5 MG tablet Take 1 tablet (5 mg total) by mouth daily. 11/30/15   Bettey Costa, MD  furosemide (LASIX) 20 MG tablet Take 1 tablet (20 mg total) by mouth 2 (two) times daily. 09/25/17   Dustin Flock, MD  gabapentin (NEURONTIN) 300 MG capsule Take 300 mg by mouth at bedtime.    [provider]  glipiZIDE (GLUCOTROL XL) 5 MG 24 hr tablet Take 5 mg by mouth every morning. 01/21/15   [provider]  levocetirizine (XYZAL) 5 MG tablet Take 5 mg by mouth every evening. 09/03/19   [provider]  levothyroxine (SYNTHROID) 150 MCG tablet Take 150 mcg by mouth daily before breakfast.     [provider]  metFORMIN (GLUCOPHAGE) 500 MG tablet Take 500 mg by  mouth 2 (two) times daily.     [provider]  metoprolol succinate (TOPROL-XL) 50 MG 24 hr tablet Take 50 mg by mouth daily.     [provider]  nitroGLYCERIN (NITROSTAT) 0.4 MG SL tablet Place 0.4 mg under the tongue every 5 (five) minutes as needed for chest pain.    [provider]  omeprazole (PRILOSEC) 20 MG capsule Take 20 mg by mouth every morning.     [provider]  simvastatin (ZOCOR) 40 MG tablet Take 40 mg by mouth every evening.     [provider]  sodium chloride flush (NS) 0.9 % SOLN 5  mLs by Intracatheter route every 8 (eight) hours. 10/07/19 11/06/19  Benjamine Sprague, DO  tamsulosin (FLOMAX) 0.4 MG CAPS capsule Take 0.4 mg by mouth every morning.     [provider]  temazepam (RESTORIL) 30 MG capsule Take 30-60 mg by mouth at bedtime as needed for sleep.     [provider]  warfarin (COUMADIN) 1 MG tablet Take 1 mg by mouth See admin instructions. Take 1 tablet (1mg ) with 3mg  dose on Tuesday and Wednesday evening    [provider]  warfarin (COUMADIN) 3 MG tablet Take 3 mg by mouth See admin instructions. Take 1 tablet (3mg ) every evening - take with 1mg  dose on Tuesday and Wednesday    [provider]     Family History  Problem Relation Age of Onset  . Cancer Mother   . Lung cancer Father   . Kidney disease Sister   . Heart failure Brother     Social History   Socioeconomic History  . Marital status: Married    Spouse name: Not on file  . Number of children: Not on file  . Years of education: Not on file  . Highest education level: Not on file  Occupational History  . Occupation: retired  Tobacco Use  . Smoking status: Former Smoker    Packs/day: 0.50    Years: 15.00    Pack years: 7.50    Types: Cigarettes    Quit date: 04/22/1985    Years since quitting: 34.5  . Smokeless tobacco: Former Systems developer    Types: Chew  Substance and Sexual Activity  . Alcohol use: No  . Drug use: No  . Sexual activity: Not Currently  Other Topics Concern  . Not on file  Social History Narrative  . Not on file   Social Determinants of Health   Financial Resource Strain:   . Difficulty of Paying Living Expenses: Not on file  Food Insecurity:   . Worried About Charity fundraiser in the Last Year: Not on file  . Ran Out of Food in the Last Year: Not on file  Transportation Needs:   . Lack of Transportation (Medical): Not on file  . Lack of Transportation (Non-Medical): Not on file  Physical Activity:   . Days of Exercise per Week: Not  on file  . Minutes of Exercise per Session: Not on file  Stress:   . Feeling of Stress : Not on file  Social Connections:   . Frequency of Communication with Friends and Family: Not on file  . Frequency of Social Gatherings with Friends and Family: Not on file  . Attends Religious Services: Not on file  . Active Member of Clubs or Organizations: Not on file  . Attends Archivist Meetings: Not on file  . Marital Status: Not on file  Review of Systems: A 12 point ROS discussed and pertinent positives are indicated in the HPI above.  All other systems are negative.  Review of Systems  Vital Signs: BP 126/63   Pulse 73   Temp 97.8 F (36.6 C)   SpO2 93%   Physical Exam Constitutional:      Appearance: Normal appearance. He is not ill-appearing.  HENT:     Mouth/Throat:     Mouth: Mucous membranes are moist.     Pharynx: Oropharynx is clear.  Cardiovascular:     Rate and Rhythm: Normal rate and regular rhythm.     Heart sounds: Normal heart sounds.  Pulmonary:     Effort: Pulmonary effort is normal. No respiratory distress.     Breath sounds: Normal breath sounds.  Abdominal:     Palpations: Abdomen is soft.     Comments: RUQ drain intact, site clean, NT Output thin bilious  Neurological:     Mental Status: He is alert.  Psychiatric:        Mood and Affect: Mood normal.        Thought Content: Thought content normal.        Judgment: Judgment normal.     Imaging: CT IMAGE GUIDED DRAINAGE BY PERCUTANEOUS CATHETER  Result Date: 10/06/2019 INDICATION: Acute calculus cholecystitis EXAM: CT PERCUTANEOUS TRANSHEPATIC CHOLECYSTOSTOMY MEDICATIONS: The patient is currently admitted to the hospital and receiving intravenous antibiotics. The antibiotics were administered within an appropriate time frame prior to the initiation of the procedure. ANESTHESIA/SEDATION: 0.5 mg IV Versed 25 mcg IV Fentanyl Moderate Sedation Time:  12 MINUTES The patient was continuously  monitored during the procedure by the interventional radiology nurse under my direct supervision. COMPLICATIONS: None immediate. TECHNIQUE: Informed written consent was obtained from the patient after a thorough discussion of the procedural risks, benefits and alternatives. All questions were addressed. Maximal Sterile Barrier Technique was utilized including caps, mask, sterile gowns, sterile gloves, sterile drape, hand hygiene and skin antiseptic. A timeout was performed prior to the initiation of the procedure. PROCEDURE: Previous imaging reviewed. Patient positioned supine. Noncontrast localization CT performed. The distended gallbladder containing gallstones was localized and marked for a right upper quadrant transhepatic approach. Under sterile conditions and local anesthesia, an 18 gauge introducer needle was advanced from an inferior transhepatic approach into the gallbladder. Needle position confirmed with CT. Syringe aspiration yielded exudative bile. Sample sent for culture. Guidewire inserted followed by tract dilatation to insert a 10 [place] drain. Drain catheter position confirmed with CT. Syringe aspiration yielded 120 cc of bile. This completely collapsed the gallbladder. Catheter secured with Prolene suture and connected to external gravity drainage bag. Sterile dressing applied. No immediate complication. FINDINGS: Imaging confirms needle placement into the gallbladder for cholecystostomy insertion IMPRESSION: Successful CT-guided transhepatic cholecystostomy Electronically Signed   By: Jerilynn Mages.  Shick M.D.   On: 10/06/2019 11:41   IR Radiologist Eval & Mgmt  Result Date: 11/04/2019 Please refer to notes tab for details about interventional procedure. (Op Note)   Labs:  CBC: Recent Labs    10/03/19 0410 10/04/19 0434 10/05/19 0603 10/06/19 0522  WBC 9.5 9.5 8.7 9.6  HGB 10.7* 12.1* 11.3* 10.6*  HCT 33.2* 35.0* 33.6* 32.2*  PLT 87* 75* 109* 117*    COAGS: Recent Labs     10/01/19 2240 10/02/19 0600 10/04/19 0434 10/05/19 0603 10/06/19 0522 10/07/19 0415  INR  --    < > 2.3* 2.0* 1.4* 1.4*  APTT 41*  --   --   --   --   --    < > =  values in this interval not displayed.    BMP: Recent Labs    10/04/19 0434 10/05/19 0603 10/06/19 0522 10/07/19 0415  NA 140 136 137 137  K 4.0 3.8 4.0 3.8  CL 103 100 100 101  CO2 26 25 26 24   GLUCOSE 117* 127* 145* 194*  BUN 32* 21 21 19   CALCIUM 8.2* 8.2* 8.2* 7.9*  CREATININE 1.24 1.33* 1.45* 1.55*  GFRNONAA 54* 49* 45* 41*  GFRAA >60 57* 52* 48*    LIVER FUNCTION TESTS: Recent Labs    10/04/19 0434 10/05/19 0603 10/06/19 0522 10/07/19 0415  BILITOT 1.4* 1.2 1.1 0.7  AST 43* 27 21 22   ALT 35 26 21 20   ALKPHOS 62 65 59 59  PROT 6.7 6.3* 6.2* 5.6*  ALBUMIN 2.7* 2.5* 2.4* 2.2*    TUMOR MARKERS: No results for input(s): AFPTM, CEA, CA199, CHROMGRNA in the last 8760 hours.  Assessment and Plan: Cholecystitis S/p perc chole drain 8/23 Drain injection per report. Stones with patent cystic and CBDs Keep scheduled follow up with surgeon. If surgery is delayed, can be set up for drain exchange   Thank you for this interesting consult.  I greatly enjoyed meeting DEJA KAIGLER and look forward to participating in their care.  A copy of this report was sent to the requesting provider on this date.  Electronically Signed: Ascencion Dike 11/04/2019, 2:34 PM   I spent a total of 20 minutes in face to face in clinical consultation, greater than 50% of which was counseling/coordinating care for perc chole drain

## 2019-11-14 ENCOUNTER — Other Ambulatory Visit
Admission: RE | Admit: 2019-11-14 | Discharge: 2019-11-14 | Disposition: A | Discharge status: Home / Self Care | Payer: Medicare HMO | Source: Ambulatory Visit | Attending: Surgery | Admitting: Surgery

## 2019-11-14 ENCOUNTER — Ambulatory Visit: Payer: Self-pay | Admitting: Surgery

## 2019-11-14 ENCOUNTER — Other Ambulatory Visit: Payer: Self-pay

## 2019-11-14 HISTORY — DX: Presence of cardiac pacemaker: Z95.0

## 2019-11-14 NOTE — Patient Instructions (Signed)
Your procedure is scheduled on: Friday November 21, 2019. Report to Day Surgery inside [facility] 2nd floor. To find out your arrival time please call [phone] between 1PM - 3PM on Thursday November 20, 2019.  Remember: Instructions that are not followed completely may result in serious medical risk,  up to and including death, or upon the discretion of your surgeon and anesthesiologist your  surgery may need to be rescheduled.     _X__ 1. Do not eat food after midnight the night before your procedure.                 No chewing gum or hard candies. You may drink clear liquids up to 2 hours                 before you are scheduled to arrive for your surgery- DO not drink clear                 liquids within 2 hours of the start of your surgery.                 Clear Liquids include:  water, apple juice without pulp, clear Gatorade, G2 or                  Gatorade Zero (avoid Red/Purple/Blue), Black Coffee or Tea (Do not add                 anything to coffee or tea).  __X__2.  On the morning of surgery brush your teeth with toothpaste and water, you                may rinse your mouth with mouthwash if you wish.  Do not swallow any toothpaste of mouthwash.     _X__ 3.  No Alcohol for 24 hours before or after surgery.   _X__ 4.  Do Not Smoke or use e-cigarettes For 24 Hours Prior to Your Surgery.                 Do not use any chewable tobacco products for at least 6 hours prior to                 Surgery.  _X__  5.  Do not use any recreational drugs (marijuana, cocaine, heroin, ecstasy, MDMA or other)                For at least one week prior to your surgery.  Combination of these drugs with anesthesia                May have life threatening results.  __x__ 6.  Notify your doctor if there is any change in your medical condition      (cold, fever, infections).     Do not wear jewelry, make-up, hairpins, clips or nail polish. Do not wear lotions, powders,  or perfumes. You may wear deodorant. Do not shave 48 hours prior to surgery. Men may shave face and neck. Do not bring valuables to the hospital.    [facility] is not responsible for any belongings or valuables.  Contacts, dentures or bridgework may not be worn into surgery. Leave your suitcase in the car. After surgery it may be brought to your room. For patients admitted to the hospital, discharge time is determined by your treatment team.   Patients discharged the day of surgery will not be allowed to drive home.   Make arrangements for someone to be  with you for the first 24 hours of your Same Day Discharge.   __x__ Take these medicines the morning of surgery with A SIP OF WATER:    1. omeprazole (PRILOSEC) 20 MG  2. levothyroxine (SYNTHROID) 150 MCG   3. metoprolol succinate (TOPROL-XL) 50 MG 24  4. tamsulosin (FLOMAX) 0.4 MG    __x__ Use CHG Soap (or wipes) as directed  __x__ Stop metformin 2 days prior to surgery (Last dose will be Tuesday November 18, 2019)    __x__ Stop Coumadin as instructed by your provider.  __x__ Stop Anti-inflammatories such as Ibuprofen, Aleve, Advil, Naproxen and or BC powders.    __x__ Stop supplements until after surgery.    __x__ Do not start any herbal supplements before your procedure.    If you have any questions regarding your pre-procedure instructions,  Please call Pre-admit Testing at [phone].

## 2019-11-14 NOTE — H&P (View-Only) (Signed)
Subjective:   CC: Acute cholecystitis [K81.0]   HPI: Derrick Cochran is a 84 y.o. male who is here for followup from above. No issues since hospital discharge, drain has been working well.   Current Medications: has a current medication list which includes the following prescription(s): accu-chek fastclix lancet drum, accu-chek nano, accu-chek smartview contrl sol, accu-chek smartview test strip, acetaminophen, alcohol swabs, aspirin, cyanocobalamin, dapagliflozin propanediol, enalapril, furosemide, gabapentin, glipizide, levocetirizine, levothyroxine, meclizine, metformin, metoprolol succinate, nitroglycerin, omeprazole, simvastatin, tamsulosin, temazepam, trueplus lancets, warfarin, and warfarin.  Allergies:  Allergies  Allergen Reactions  . Cefdinir Other (See Comments)  nightmares  . Sulfa (Sulfonamide Antibiotics) Unknown  . Adhesive Rash  . Adhesive Tape-Silicones Rash  . Sulfamethoxazole Itching, Rash and Hives   ROS: General: Denies weight loss, weight gain, fatigue, fevers, chills, and night sweats. Heart: Denies chest pain, palpitations, racing heart, irregular heartbeat, leg pain or swelling, and decreased activity tolerance. Respiratory: Denies breathing difficulty, shortness of breath, wheezing, cough, and sputum. GI: Denies change in appetite, heartburn, nausea, vomiting, constipation, diarrhea, and blood in stool. GU: Denies difficulty urinating, pain with urinating, urgency, frequency, blood in urine   Objective:    BP 115/64  Pulse 72  Ht 182.9 cm (6')  Wt 76.2 kg (168 lb)  BMI 22.78 kg/m   Constitutional : alert, appears stated age, cooperative and no distress  Gastrointestinal: soft, non-tender; bowel sounds normal; no masses, no organomegaly. Chole tube with dark green output with some sediment  Musculoskeletal: Steady gait and movement  Skin: Cool and moist, incisions clean, dry, intact. No erythema, induration or drainage to indicate infection.   Psychiatric: Normal affect, non-agitated, not confused    LABS:  N/A   RADS: N/A  Assessment:    Acute cholecystitis [K81.0], s/p IR guided drain placement secondary to comorbidities placing him too high risk to proceed with lap chole.  Plan:    1. Healing well. No issues. Discussed again r/b/a to surgery, patient and wife requested we proceed with surgery now.

## 2019-11-14 NOTE — H&P (Signed)
Subjective:   CC: Acute cholecystitis [K81.0]   HPI: Derrick Cochran is a 84 y.o. male who is here for followup from above. No issues since hospital discharge, drain has been working well.   Current Medications: has a current medication list which includes the following prescription(s): accu-chek fastclix lancet drum, accu-chek nano, accu-chek smartview contrl sol, accu-chek smartview test strip, acetaminophen, alcohol swabs, aspirin, cyanocobalamin, dapagliflozin propanediol, enalapril, furosemide, gabapentin, glipizide, levocetirizine, levothyroxine, meclizine, metformin, metoprolol succinate, nitroglycerin, omeprazole, simvastatin, tamsulosin, temazepam, trueplus lancets, warfarin, and warfarin.  Allergies:  Allergies  Allergen Reactions  . Cefdinir Other (See Comments)  nightmares  . Sulfa (Sulfonamide Antibiotics) Unknown  . Adhesive Rash  . Adhesive Tape-Silicones Rash  . Sulfamethoxazole Itching, Rash and Hives   ROS: General: Denies weight loss, weight gain, fatigue, fevers, chills, and night sweats. Heart: Denies chest pain, palpitations, racing heart, irregular heartbeat, leg pain or swelling, and decreased activity tolerance. Respiratory: Denies breathing difficulty, shortness of breath, wheezing, cough, and sputum. GI: Denies change in appetite, heartburn, nausea, vomiting, constipation, diarrhea, and blood in stool. GU: Denies difficulty urinating, pain with urinating, urgency, frequency, blood in urine   Objective:    BP 115/64  Pulse 72  Ht 182.9 cm (6')  Wt 76.2 kg (168 lb)  BMI 22.78 kg/m   Constitutional : alert, appears stated age, cooperative and no distress  Gastrointestinal: soft, non-tender; bowel sounds normal; no masses, no organomegaly. Chole tube with dark green output with some sediment  Musculoskeletal: Steady gait and movement  Skin: Cool and moist, incisions clean, dry, intact. No erythema, induration or drainage to indicate infection.   Psychiatric: Normal affect, non-agitated, not confused    LABS:  N/A   RADS: N/A  Assessment:    Acute cholecystitis [K81.0], s/p IR guided drain placement secondary to comorbidities placing him too high risk to proceed with lap chole.  Plan:    1. Healing well. No issues. Discussed again r/b/a to surgery, patient and wife requested we proceed with surgery now.

## 2019-11-17 ENCOUNTER — Other Ambulatory Visit: Payer: Medicare HMO

## 2019-11-17 DIAGNOSIS — I110 Hypertensive heart disease with heart failure: Secondary | ICD-10-CM | POA: Diagnosis not present

## 2019-11-17 DIAGNOSIS — I482 Chronic atrial fibrillation, unspecified: Secondary | ICD-10-CM | POA: Diagnosis not present

## 2019-11-17 DIAGNOSIS — I255 Ischemic cardiomyopathy: Secondary | ICD-10-CM | POA: Diagnosis not present

## 2019-11-17 DIAGNOSIS — I5022 Chronic systolic (congestive) heart failure: Secondary | ICD-10-CM | POA: Diagnosis not present

## 2019-11-17 DIAGNOSIS — K819 Cholecystitis, unspecified: Secondary | ICD-10-CM | POA: Diagnosis not present

## 2019-11-17 DIAGNOSIS — I350 Nonrheumatic aortic (valve) stenosis: Secondary | ICD-10-CM | POA: Diagnosis not present

## 2019-11-17 DIAGNOSIS — Z434 Encounter for attention to other artificial openings of digestive tract: Secondary | ICD-10-CM | POA: Diagnosis not present

## 2019-11-17 DIAGNOSIS — E119 Type 2 diabetes mellitus without complications: Secondary | ICD-10-CM | POA: Diagnosis not present

## 2019-11-17 DIAGNOSIS — M47816 Spondylosis without myelopathy or radiculopathy, lumbar region: Secondary | ICD-10-CM | POA: Diagnosis not present

## 2019-11-18 NOTE — Progress Notes (Signed)
[facility] Perioperative Services  Pre-Admission/Anesthesia Testing Clinical Review  Date: 11/19/19  Patient Demographics:  Name: Derrick Cochran DOB:   23-May-1935 MRN:   [id number]  Planned Surgical Procedure(s):    Case: [id number] Date/Time: 11/21/19 0900   Procedure: XI ROBOTIC ASSISTED LAPAROSCOPIC CHOLECYSTECTOMY (N/A Abdomen)   Anesthesia type: General   Pre-op diagnosis: K81.0 Acute cholecystitis   Location: [facility] OR ROOM 06 / [place] ORS FOR ANESTHESIA GROUP   Surgeons: Benjamine Sprague, DO     NOTE: Available PAT nursing documentation and vital signs have been reviewed. Clinical nursing staff has updated patient's PMH/PSHx, current medication list, and drug allergies/intolerances to ensure comprehensive history available to assist in medical decision making as it pertains to the aforementioned surgical procedure and anticipated anesthetic course.   Clinical Discussion:  Derrick Cochran is a 84 y.o. male who is submitted for pre-surgical anesthesia review and clearance prior to him undergoing the above procedure. Patient is a Former Smoker (7.5 pack years; quit 04/1985). Pertinent PMH includes: CAD (s/p CABG x 4 in 10/1993), inferior STEMI (1994; status post PCTA of RCA), AAA, aortic stenosis (status post TAVR in 03/2014), A. fib, ischemic cardiomyopathy, systolic heart failure, sick sinus syndrome (s/p PPM placement), HTN, HLD, T2DM, DOE, hypothyroidism, GERD (on daily PPI), esophageal stricture, OA, DDD.  Patient is followed by cardiology Saralyn Pilar, MD). He was last seen in the cardiology clinic on 10/21/2019; notes reviewed.  At the time of his clinic visit, patient was doing well from a cardiovascular perspective.  Patient denied chest pain, palpitations, peripheral edema, and presyncope/syncope.  Patient has chronic exertional dyspnea, however he advises that this was at baseline.  Patient has a significant cardiac history.  Patient suffered an inferior wall STEMI  in 1994 treated with PCTA of RCA.  Patient with significant CAD and is status post a four-vessel CABG in 10/1993.  He underwent TAVR and 03/2014.  Patient with dual-chamber PPM in place for sick sinus syndrome, and despite DCCV, patient remains chronically in atrial fibrillation.  Patient rate controlled on beta-blocker (metoprolol). CHA2DS2-VASc Score = 5.  Patient chronically anticoagulated using warfarin.  Hypertension well controlled on beta-blocker, ACEi, and loop diuretic therapy.  Patient takes a daily statin for her HLD.  AAA stable at 3.2 cm; stability assessed every 3 years via ultrasound imaging.  Last TTE done in 07/2019 revealed severe LV systolic dysfunction (LVEF of < 15 % with a stable appearing aortic valve bioprosthesis (see full interpretation below). Given patient's current status, the decision was made to defer further cardiac diagnostics at that time.  Patient to continue all previously prescribed medications and follow-up with cardiology on outpatient basis in 3 months.  Patient is scheduled to undergo an elective laparoscopic cholecystectomy on 10 0 21 with Dr. Benjamine Sprague.  Given patient's past medical history significant for significant cardiovascular problems, presurgical cardiac clearance was sought by the PAT team.  According to primary attending cardiologist, "patient may proceed with planned surgical procedure with a low to moderate risk stratification.  He is at an acceptable risk for a low risk surgical procedure".  Again, this patient is on daily anticoagulation therapy.  He has been instructed on recommendations for holding his warfarin prior to his procedure.  He denies previous perioperative complications with anesthesia. He underwent a general anesthetic course here (ASA III) in 07/2014 with no documented complications.   Vitals with BMI 11/14/2019 11/04/2019 10/07/2019  Height 6\' 0"  - -  Weight 154 lbs - -  BMI 20.88 - -  Systolic - 742 595  Diastolic - 63 58  Pulse -  73 60    Providers/Specialists:   NOTE: Primary physician provider listed below. Patient may have been seen by APP or partner within same practice.   PROVIDER ROLE LAST Shirline Frees, DO General Surgery 10/21/2019  [name], MD Primary Care Provider 10/01/2019  [name], MD Cardiology 10/21/2019   Allergies:  Adhesive [tape], Cefdinir, Sulfa antibiotics, and Sulfamethoxazole  Current Home Medications:   No current facility-administered medications for this encounter.   Marland Kitchen acetaminophen (TYLENOL) 500 MG tablet  . enalapril (VASOTEC) 5 MG tablet  . furosemide (LASIX) 20 MG tablet  . gabapentin (NEURONTIN) 300 MG capsule  . glipiZIDE (GLUCOTROL XL) 5 MG 24 hr tablet  . levocetirizine (XYZAL) 5 MG tablet  . levothyroxine (SYNTHROID) 150 MCG tablet  . metFORMIN (GLUCOPHAGE) 500 MG tablet  . metoprolol succinate (TOPROL-XL) 50 MG 24 hr tablet  . nitroGLYCERIN (NITROSTAT) 0.4 MG SL tablet  . omeprazole (PRILOSEC) 20 MG capsule  . simvastatin (ZOCOR) 40 MG tablet  . tamsulosin (FLOMAX) 0.4 MG CAPS capsule  . temazepam (RESTORIL) 30 MG capsule  . warfarin (COUMADIN) 1 MG tablet  . warfarin (COUMADIN) 3 MG tablet   History:   Past Medical History:  Diagnosis Date  . AAA (abdominal aortic aneurysm) ([place])   . Arrhythmia    atrial fibrillation  . Arthritis   . BPH (benign prostatic hyperplasia)   . Cancer ([place])    skin  . CHF (congestive heart failure) ([place])   . Coronary artery disease   . DDD (degenerative disc disease), thoracic   . Diabetes mellitus without complication ([place])   . Esophageal stricture   . GERD (gastroesophageal reflux disease)   . Heart murmur   . Hyperlipidemia   . Hypertension   . Hypothyroidism   . Myocardial infarction ([place])   . Presence of permanent cardiac pacemaker   . Shortness of breath dyspnea    Past Surgical History:  Procedure Laterality Date  . AORTIC VALVE REPLACEMENT  1995   Bioprosthetic - [place]  .  APPENDECTOMY    . CARDIAC CATHETERIZATION  1994 x 2 with PCTA of RCA  . CARDIAC VALVE REPLACEMENT     Bovine transcatheter heart valve  . COLONOSCOPY  2015  . CORONARY ARTERY BYPASS GRAFT  1995   x 4 Vessels - [facility]  . ELECTROPHYSIOLOGIC STUDY N/A 08/06/2014   Procedure: Cardioversion;  Surgeon: [name], MD;  Location: [facility];  Service: Cardiovascular;  Laterality: N/A;  . ELECTROPHYSIOLOGIC STUDY N/A 06/18/2014   Procedure: CARDIOVERSION;  Surgeon: [name], MD;  Location: [facility] ORS;  Service: Cardiovascular;  Laterality: N/A;  . ESOPHAGOGASTRODUODENOSCOPY  2014  . EYE SURGERY    . GREAT TOE ARTHRODESIS, INTERPHALANGEAL JOINT Left   . HERNIA REPAIR Left    Inguinal  . IR RADIOLOGIST EVAL & MGMT  11/04/2019  . PACEMAKER INSERTION N/A 07/02/2014   Procedure: INSERTION PACEMAKER/DUAL CHAMBER  INITIAL IMPLANT;  Surgeon: [name], MD;  Location: [facility] ORS;  Service: Cardiovascular;  Laterality: N/A;  . SKIN BIOPSY     Family History  Problem Relation Age of Onset  . Cancer Mother   . Lung cancer Father   . Kidney disease Sister   . Heart failure Brother    Social History   Tobacco Use  . Smoking status: Former Smoker    Packs/day: 0.50    Years: 15.00    Pack years: 7.50  Types: Cigarettes    Quit date: 04/22/1985    Years since quitting: 34.6  . Smokeless tobacco: Former Systems developer    Types: Secondary school teacher  . Vaping Use: Never used  Substance Use Topics  . Alcohol use: No  . Drug use: No    Pertinent Clinical Results:  LABS:   Hospital Outpatient Visit on 11/19/2019  Component Date Value Ref Range Status  . Sodium 11/19/2019 143  135 - 145 mmol/L Final  . Potassium 11/19/2019 3.5  3.5 - 5.1 mmol/L Final  . Chloride 11/19/2019 105  98 - 111 mmol/L Final  . CO2 11/19/2019 27  22 - 32 mmol/L Final  . Glucose, Bld 11/19/2019 98  70 - 99 mg/dL Final   Glucose reference range applies only to samples taken after fasting for at least  8 hours.  . BUN 11/19/2019 23  8 - 23 mg/dL Final  . Creatinine, Ser 11/19/2019 1.05  0.61 - 1.24 mg/dL Final  . Calcium 11/19/2019 8.6* 8.9 - 10.3 mg/dL Final  . GFR calc non Af Amer 11/19/2019 >60  >60 mL/min Final  . Anion gap 11/19/2019 11  5 - 15 Final   Performed at [facility], [address], [place], [place] [address]  . WBC 11/19/2019 5.8  4.0 - 10.5 K/uL Final  . RBC 11/19/2019 3.51* 4.22 - 5.81 MIL/uL Final  . Hemoglobin 11/19/2019 11.6* 13.0 - 17.0 g/dL Final  . HCT 11/19/2019 34.2* 39 - 52 % Final  . MCV 11/19/2019 97.4  80.0 - 100.0 fL Final  . MCH 11/19/2019 33.0  26.0 - 34.0 pg Final  . MCHC 11/19/2019 33.9  30.0 - 36.0 g/dL Final  . RDW 11/19/2019 13.4  11.5 - 15.5 % Final  . Platelets 11/19/2019 86* 150 - 400 K/uL Final   Comment: PLATELET COUNT CONFIRMED BY SMEAR Immature Platelet Fraction may be clinically indicated, consider ordering this additional test [id number]   . nRBC 11/19/2019 0.0  0.0 - 0.2 % Final   Performed at [facility], [address] [place], [place] [address]    ECG: Date: 10/01/2019 Time ECG obtained: 1601 PM Rate: 72 bpm Rhythm:  Atrial fibrillation with occasional ventricular paced complexes Axis (leads I and aVF): Right axis deviation Intervals: QRS 184 ms. QTc 556 ms. ST segment and T wave changes: No evidence of acute ST segment elevation or depression Comparison: Similar to previous tracing obtained on 07/08/2019 at [facility] / PROCEDURES: ECHOCARDIOGRAM done on 07/24/2019 1. LVEF <15% 2. Severe LV systolic dysfunction 3. Normal RV systolic function 4. Mild AR, TR; moderate MR; trivial PR 5. Stable appearing AV bioprosthesis; mean gradient 11.6 mmHg 6. No valvular stenosis  VASCULAR ULTRASOUND AAA DUPLEX LIMITED done on 02/16/2017 1. Aneurysmal dilatation of the distal abdominal aorta with maximal diameter of 3.2 cm 2. No significant change noted when compared to the previous  exam  Impression and Plan:  Derrick Cochran has been referred for pre-anesthesia review and clearance prior to him undergoing the planned anesthetic and procedural courses. Available labs, pertinent testing, and imaging results were personally reviewed by me. This patient has been appropriately cleared by cardiology.  Perioperative prescription for implanted cardiac device programming form completed by cardiology and placed on chart for day of surgery review.  Primary attending surgeon made aware of thrombocytopenia noted on PAT labs (platelets 86,000) via [facility].  Based on clinical review performed today (11/19/19), barring any significant acute changes in the patient's overall  condition, it is anticipated that he will be able to proceed with the planned surgical intervention. Any acute changes in clinical condition may necessitate his procedure being postponed and/or cancelled. Pre-surgical instructions were reviewed with the patient during his PAT appointment and questions were fielded by PAT clinical staff.  Honor Loh, MSN, APRN, FNP-C, CEN [facility]  Peri-operative Services Nurse Practitioner Phone: [phone] 11/19/19 2:12 PM  NOTE: This note has been prepared using Dragon dictation software. Despite my best ability to proofread, there is always the potential that unintentional transcriptional errors may still occur from this process.

## 2019-11-19 ENCOUNTER — Other Ambulatory Visit: Payer: Self-pay

## 2019-11-19 ENCOUNTER — Other Ambulatory Visit
Admission: RE | Admit: 2019-11-19 | Discharge: 2019-11-19 | Disposition: A | Discharge status: Home / Self Care | Payer: Medicare HMO | Source: Ambulatory Visit | Attending: Surgery | Admitting: Surgery

## 2019-11-19 DIAGNOSIS — Z01812 Encounter for preprocedural laboratory examination: Secondary | ICD-10-CM | POA: Insufficient documentation

## 2019-11-19 DIAGNOSIS — Z20822 Contact with and (suspected) exposure to covid-19: Secondary | ICD-10-CM | POA: Diagnosis not present

## 2019-11-19 LAB — BASIC METABOLIC PANEL
Anion gap: 11 (ref 5–15)
BUN: 23 mg/dL (ref 8–23)
CO2: 27 mmol/L (ref 22–32)
Calcium: 8.6 mg/dL — ABNORMAL LOW (ref 8.9–10.3)
Chloride: 105 mmol/L (ref 98–111)
Creatinine, Ser: 1.05 mg/dL (ref 0.61–1.24)
GFR calc non Af Amer: >60 mL/min (ref >60)
Glucose, Bld: 98 mg/dL (ref 70–99)
Potassium: 3.5 mmol/L (ref 3.5–5.1)
Sodium: 143 mmol/L (ref 135–145)

## 2019-11-19 LAB — CBC
HCT: 34.2 % — ABNORMAL LOW (ref 39.0–52.0)
Hemoglobin: 11.6 g/dL — ABNORMAL LOW (ref 13.0–17.0)
MCH: 33 pg (ref 26.0–34.0)
MCHC: 33.9 g/dL (ref 30.0–36.0)
MCV: 97.4 fL (ref 80.0–100.0)
Platelets: 86 10*3/uL — ABNORMAL LOW (ref 150–400)
RBC: 3.51 MIL/uL — ABNORMAL LOW (ref 4.22–5.81)
RDW: 13.4 % (ref 11.5–15.5)
WBC: 5.8 10*3/uL (ref 4.0–10.5)
nRBC: 0 % (ref 0.0–0.2)

## 2019-11-19 LAB — SARS CORONAVIRUS 2 (TAT 6-24 HRS): SARS Coronavirus 2: NEGATIVE

## 2019-11-19 NOTE — Progress Notes (Signed)
  [facility] Perioperative Services: Pre-Admission/Anesthesia Testing  Abnormal Lab Notification   Date: 11/19/19  Name: QUAYSHAUN HUBBERT MRN:   [id number]  Re: Abnormal labs noted during PAT appointment   Provider(s) Notified: Benjamine Sprague, DO Notification mode: Routed and/or faxed via [facility]   ABNORMAL LAB VALUE(S): Lab Results  Component Value Date   PLT 86 (L) 11/19/2019    Notes: CBC performed with PAT labs today reveal is thrombocytopenic.  Platelet count trended back to 06/2019 and noted to range between 75,000 and 117,000.  Patient is scheduled for a XI ROBOTIC ASSISTED LAPAROSCOPIC CHOLECYSTECTOMY (N/A Abdomen) on 11/21/2019. 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  Honor Loh, MSN, APRN, FNP-C, CEN [facility]  Peri-operative Services Nurse Practitioner Phone: [phone] 11/19/19 2:16 PM

## 2019-11-20 ENCOUNTER — Ambulatory Visit: Payer: Self-pay | Admitting: Surgery

## 2019-11-21 ENCOUNTER — Encounter
Admission: RE | Disposition: A | Discharge status: Home / Self Care | Payer: Self-pay | Source: Ambulatory Visit | Attending: Surgery

## 2019-11-21 ENCOUNTER — Ambulatory Visit: Payer: Medicare HMO | Admitting: Urgent Care

## 2019-11-21 ENCOUNTER — Other Ambulatory Visit: Payer: Self-pay

## 2019-11-21 ENCOUNTER — Observation Stay
Admission: RE | Admit: 2019-11-21 | Discharge: 2019-11-22 | Disposition: A | Discharge status: Home / Self Care | Payer: Medicare HMO | Source: Ambulatory Visit | Attending: Surgery | Admitting: Surgery

## 2019-11-21 ENCOUNTER — Ambulatory Visit: Payer: Self-pay | Admitting: Surgery

## 2019-11-21 ENCOUNTER — Encounter: Payer: Self-pay | Admitting: Surgery

## 2019-11-21 DIAGNOSIS — K810 Acute cholecystitis: Secondary | ICD-10-CM | POA: Diagnosis not present

## 2019-11-21 DIAGNOSIS — I110 Hypertensive heart disease with heart failure: Secondary | ICD-10-CM | POA: Diagnosis not present

## 2019-11-21 DIAGNOSIS — K811 Chronic cholecystitis: Principal | ICD-10-CM | POA: Diagnosis present

## 2019-11-21 DIAGNOSIS — Z7984 Long term (current) use of oral hypoglycemic drugs: Secondary | ICD-10-CM | POA: Insufficient documentation

## 2019-11-21 DIAGNOSIS — E785 Hyperlipidemia, unspecified: Secondary | ICD-10-CM | POA: Diagnosis not present

## 2019-11-21 DIAGNOSIS — E119 Type 2 diabetes mellitus without complications: Secondary | ICD-10-CM | POA: Diagnosis not present

## 2019-11-21 DIAGNOSIS — K8010 Calculus of gallbladder with chronic cholecystitis without obstruction: Secondary | ICD-10-CM | POA: Diagnosis not present

## 2019-11-21 DIAGNOSIS — E039 Hypothyroidism, unspecified: Secondary | ICD-10-CM | POA: Diagnosis not present

## 2019-11-21 DIAGNOSIS — K812 Acute cholecystitis with chronic cholecystitis: Secondary | ICD-10-CM | POA: Diagnosis not present

## 2019-11-21 DIAGNOSIS — I5022 Chronic systolic (congestive) heart failure: Secondary | ICD-10-CM | POA: Diagnosis not present

## 2019-11-21 LAB — CBC
HCT: 33.9 % — ABNORMAL LOW (ref 39.0–52.0)
Hemoglobin: 10.9 g/dL — ABNORMAL LOW (ref 13.0–17.0)
MCH: 32.9 pg (ref 26.0–34.0)
MCHC: 32.2 g/dL (ref 30.0–36.0)
MCV: 102.4 fL — ABNORMAL HIGH (ref 80.0–100.0)
Platelets: 69 10*3/uL — ABNORMAL LOW (ref 150–400)
RBC: 3.31 MIL/uL — ABNORMAL LOW (ref 4.22–5.81)
RDW: 13.3 % (ref 11.5–15.5)
WBC: 7.6 10*3/uL (ref 4.0–10.5)
nRBC: 0 % (ref 0.0–0.2)

## 2019-11-21 LAB — PROTIME-INR
INR: 1.2 (ref 0.8–1.2)
Prothrombin Time: 14.7 seconds (ref 11.4–15.2)

## 2019-11-21 LAB — CREATININE, SERUM
Creatinine, Ser: 1.14 mg/dL (ref 0.61–1.24)
GFR, Estimated: 60 mL/min — ABNORMAL LOW (ref >60)

## 2019-11-21 LAB — GLUCOSE, CAPILLARY
Glucose-Capillary: 89 mg/dL (ref 70–99)
Glucose-Capillary: 145 mg/dL — ABNORMAL HIGH (ref 70–99)

## 2019-11-21 LAB — PLATELET COUNT: Platelets: 73 10*3/uL — ABNORMAL LOW (ref 150–400)

## 2019-11-21 SURGERY — CHOLECYSTECTOMY, ROBOT-ASSISTED, LAPAROSCOPIC
Anesthesia: General | Site: Abdomen

## 2019-11-21 MED ORDER — SUGAMMADEX SODIUM 200 MG/2ML IV SOLN
INTRAVENOUS | Status: DC | PRN
  Administered 2019-11-21: 300 mg via INTRAVENOUS

## 2019-11-21 MED ORDER — OXYCODONE HCL 5 MG/5ML PO SOLN: 5.0000 mg | Freq: Once | ORAL | Status: DC | PRN

## 2019-11-21 MED ORDER — SODIUM CHLORIDE 0.9 % IV SOLN: Freq: Once | INTRAVENOUS | Status: DC

## 2019-11-21 MED ORDER — FUROSEMIDE 20 MG PO TABS
20.0000 mg | ORAL_TABLET | Freq: Every day | ORAL | Status: DC
  Administered 2019-11-22: 20 mg via ORAL
  Filled 2019-11-21: qty 1

## 2019-11-21 MED ORDER — FENTANYL CITRATE (PF) 100 MCG/2ML IJ SOLN
INTRAMUSCULAR | Status: AC
  Filled 2019-11-21: qty 2

## 2019-11-21 MED ORDER — ONDANSETRON 4 MG PO TBDP: 4.0000 mg | ORAL_TABLET | Freq: Four times a day (QID) | ORAL | Status: DC | PRN

## 2019-11-21 MED ORDER — TAMSULOSIN HCL 0.4 MG PO CAPS
0.4000 mg | ORAL_CAPSULE | Freq: Every morning | ORAL | Status: DC
  Administered 2019-11-22: 0.4 mg via ORAL
  Filled 2019-11-21: qty 1

## 2019-11-21 MED ORDER — LIDOCAINE-EPINEPHRINE (PF) 1 %-1:200000 IJ SOLN
INTRAMUSCULAR | Status: AC
  Filled 2019-11-21: qty 30

## 2019-11-21 MED ORDER — ONDANSETRON HCL 4 MG/2ML IJ SOLN
INTRAMUSCULAR | Status: AC
  Filled 2019-11-21: qty 2

## 2019-11-21 MED ORDER — VASOPRESSIN 20 UNIT/ML IV SOLN
INTRAVENOUS | Status: AC
  Filled 2019-11-21: qty 1

## 2019-11-21 MED ORDER — NOREPINEPHRINE 4 MG/250ML-% IV SOLN
INTRAVENOUS | Status: DC | PRN
  Administered 2019-11-21: .2 ug/kg/min via INTRAVENOUS

## 2019-11-21 MED ORDER — FENTANYL CITRATE (PF) 100 MCG/2ML IJ SOLN: 25.0000 ug | INTRAMUSCULAR | Status: DC | PRN

## 2019-11-21 MED ORDER — CHLORHEXIDINE GLUCONATE 0.12 % MT SOLN
15.0000 mL | Freq: Once | OROMUCOSAL | Status: AC
  Administered 2019-11-21: 15 mL via OROMUCOSAL

## 2019-11-21 MED ORDER — ETOMIDATE 2 MG/ML IV SOLN
INTRAVENOUS | Status: AC
  Filled 2019-11-21: qty 10

## 2019-11-21 MED ORDER — ONDANSETRON HCL 4 MG/2ML IJ SOLN
INTRAMUSCULAR | Status: DC | PRN
  Administered 2019-11-21 (×2): 4 mg via INTRAVENOUS

## 2019-11-21 MED ORDER — INDOCYANINE GREEN 25 MG IV SOLR
1.2500 mg | Freq: Once | INTRAVENOUS | Status: AC
  Administered 2019-11-21: 1.25 mg via INTRAVENOUS
  Filled 2019-11-21: qty 10
  Filled 2019-11-21: qty 0.5

## 2019-11-21 MED ORDER — IBUPROFEN 400 MG PO TABS: 400.0000 mg | ORAL_TABLET | Freq: Four times a day (QID) | ORAL | Status: DC | PRN

## 2019-11-21 MED ORDER — LEVOTHYROXINE SODIUM 150 MCG PO TABS
150.0000 ug | ORAL_TABLET | Freq: Every day | ORAL | Status: DC
  Administered 2019-11-22: 150 ug via ORAL
  Filled 2019-11-21 (×2): qty 1
  Filled 2019-11-21: qty 3

## 2019-11-21 MED ORDER — OXYCODONE HCL 5 MG PO TABS: 5.0000 mg | ORAL_TABLET | Freq: Once | ORAL | Status: DC | PRN

## 2019-11-21 MED ORDER — TEMAZEPAM 7.5 MG PO CAPS
30.0000 mg | ORAL_CAPSULE | Freq: Every evening | ORAL | Status: DC | PRN
  Administered 2019-11-21: 30 mg via ORAL
  Filled 2019-11-21: qty 4

## 2019-11-21 MED ORDER — GLIPIZIDE ER 5 MG PO TB24
5.0000 mg | ORAL_TABLET | Freq: Every day | ORAL | Status: DC
  Administered 2019-11-22: 5 mg via ORAL
  Filled 2019-11-21 (×2): qty 1

## 2019-11-21 MED ORDER — NOREPINEPHRINE BITARTRATE 1 MG/ML IV SOLN
INTRAVENOUS | Status: AC
  Filled 2019-11-21: qty 4

## 2019-11-21 MED ORDER — CEFAZOLIN SODIUM-DEXTROSE 2-4 GM/100ML-% IV SOLN
INTRAVENOUS | Status: AC
  Filled 2019-11-21: qty 100

## 2019-11-21 MED ORDER — METOPROLOL SUCCINATE ER 50 MG PO TB24
50.0000 mg | ORAL_TABLET | Freq: Every day | ORAL | Status: DC
  Administered 2019-11-22: 50 mg via ORAL
  Filled 2019-11-21: qty 1

## 2019-11-21 MED ORDER — LIDOCAINE-EPINEPHRINE (PF) 1 %-1:200000 IJ SOLN
INTRAMUSCULAR | Status: DC | PRN
  Administered 2019-11-21: 10 mL via INTRAMUSCULAR

## 2019-11-21 MED ORDER — LORATADINE 10 MG PO TABS
10.0000 mg | ORAL_TABLET | Freq: Every day | ORAL | Status: DC
  Administered 2019-11-22: 10 mg via ORAL
  Filled 2019-11-21: qty 1

## 2019-11-21 MED ORDER — NITROGLYCERIN 0.4 MG SL SUBL: 0.4000 mg | SUBLINGUAL_TABLET | SUBLINGUAL | Status: DC | PRN

## 2019-11-21 MED ORDER — GABAPENTIN 300 MG PO CAPS
300.0000 mg | ORAL_CAPSULE | Freq: Every day | ORAL | Status: DC
  Administered 2019-11-21: 300 mg via ORAL
  Filled 2019-11-21: qty 1

## 2019-11-21 MED ORDER — LIDOCAINE HCL (CARDIAC) PF 100 MG/5ML IV SOSY
PREFILLED_SYRINGE | INTRAVENOUS | Status: DC | PRN
  Administered 2019-11-21: 80 mg via INTRAVENOUS

## 2019-11-21 MED ORDER — ACETAMINOPHEN 10 MG/ML IV SOLN
INTRAVENOUS | Status: AC
  Filled 2019-11-21: qty 100

## 2019-11-21 MED ORDER — PANTOPRAZOLE SODIUM 40 MG PO TBEC
40.0000 mg | DELAYED_RELEASE_TABLET | Freq: Every day | ORAL | Status: DC
  Administered 2019-11-22: 40 mg via ORAL
  Filled 2019-11-21: qty 1

## 2019-11-21 MED ORDER — SODIUM CHLORIDE (PF) 0.9 % IJ SOLN
INTRAMUSCULAR | Status: AC
  Filled 2019-11-21: qty 30

## 2019-11-21 MED ORDER — ACETAMINOPHEN 500 MG PO TABS
500.0000 mg | ORAL_TABLET | Freq: Four times a day (QID) | ORAL | Status: DC | PRN
  Administered 2019-11-22: 500 mg via ORAL
  Filled 2019-11-21: qty 1

## 2019-11-21 MED ORDER — ENOXAPARIN SODIUM 40 MG/0.4ML ~~LOC~~ SOLN
40.0000 mg | SUBCUTANEOUS | Status: DC
  Administered 2019-11-22: 40 mg via SUBCUTANEOUS
  Filled 2019-11-21: qty 0.4

## 2019-11-21 MED ORDER — CEFAZOLIN SODIUM-DEXTROSE 2-4 GM/100ML-% IV SOLN
2.0000 g | INTRAVENOUS | Status: AC
  Administered 2019-11-21: 2 g via INTRAVENOUS

## 2019-11-21 MED ORDER — METFORMIN HCL 500 MG PO TABS
500.0000 mg | ORAL_TABLET | Freq: Two times a day (BID) | ORAL | Status: DC
  Administered 2019-11-22: 500 mg via ORAL
  Filled 2019-11-21: qty 1

## 2019-11-21 MED ORDER — FENTANYL CITRATE (PF) 100 MCG/2ML IJ SOLN
INTRAMUSCULAR | Status: DC | PRN
  Administered 2019-11-21 (×3): 50 ug via INTRAVENOUS
  Administered 2019-11-21 (×2): 25 ug via INTRAVENOUS

## 2019-11-21 MED ORDER — ONDANSETRON HCL 4 MG/2ML IJ SOLN: 4.0000 mg | Freq: Once | INTRAMUSCULAR | Status: DC | PRN

## 2019-11-21 MED ORDER — ENALAPRIL MALEATE 5 MG PO TABS
5.0000 mg | ORAL_TABLET | Freq: Every day | ORAL | Status: DC
  Administered 2019-11-22: 5 mg via ORAL
  Filled 2019-11-21: qty 1

## 2019-11-21 MED ORDER — LEVOCETIRIZINE DIHYDROCHLORIDE 5 MG PO TABS: 5.0000 mg | ORAL_TABLET | Freq: Every evening | ORAL | Status: DC

## 2019-11-21 MED ORDER — ONDANSETRON HCL 4 MG/2ML IJ SOLN: 4.0000 mg | Freq: Four times a day (QID) | INTRAMUSCULAR | Status: DC | PRN

## 2019-11-21 MED ORDER — ACETAMINOPHEN 10 MG/ML IV SOLN
INTRAVENOUS | Status: DC | PRN
  Administered 2019-11-21: 1000 mg via INTRAVENOUS

## 2019-11-21 MED ORDER — BUPIVACAINE HCL (PF) 0.5 % IJ SOLN
INTRAMUSCULAR | Status: AC
  Filled 2019-11-21: qty 30

## 2019-11-21 MED ORDER — HYDROCODONE-ACETAMINOPHEN 5-325 MG PO TABS
1.0000 | ORAL_TABLET | Freq: Four times a day (QID) | ORAL | 0 refills | Status: DC | PRN
Start: 2019-11-21 — End: 2021-05-15

## 2019-11-21 MED ORDER — HYDROCODONE-ACETAMINOPHEN 5-325 MG PO TABS
1.0000 | ORAL_TABLET | ORAL | Status: DC | PRN
  Administered 2019-11-21: 2 via ORAL
  Filled 2019-11-21: qty 2

## 2019-11-21 MED ORDER — ORAL CARE MOUTH RINSE: 15.0000 mL | Freq: Once | OROMUCOSAL | Status: AC

## 2019-11-21 MED ORDER — SIMVASTATIN 40 MG PO TABS
40.0000 mg | ORAL_TABLET | Freq: Every evening | ORAL | Status: DC
  Filled 2019-11-21 (×2): qty 1

## 2019-11-21 MED ORDER — PROPOFOL 10 MG/ML IV BOLUS
INTRAVENOUS | Status: AC
  Filled 2019-11-21: qty 20

## 2019-11-21 MED ORDER — ACETAMINOPHEN 10 MG/ML IV SOLN: 1000.0000 mg | Freq: Once | INTRAVENOUS | Status: DC | PRN

## 2019-11-21 MED ORDER — DOCUSATE SODIUM 100 MG PO CAPS: 100.0000 mg | ORAL_CAPSULE | Freq: Two times a day (BID) | ORAL | 0 refills | Status: AC | PRN

## 2019-11-21 MED ORDER — LACTATED RINGERS IV SOLN: INTRAVENOUS | Status: DC

## 2019-11-21 MED ORDER — CHLORHEXIDINE GLUCONATE CLOTH 2 % EX PADS: 6.0000 | MEDICATED_PAD | Freq: Once | CUTANEOUS | Status: DC

## 2019-11-21 MED ORDER — ROCURONIUM BROMIDE 100 MG/10ML IV SOLN
INTRAVENOUS | Status: DC | PRN
  Administered 2019-11-21: 50 mg via INTRAVENOUS
  Administered 2019-11-21 (×4): 10 mg via INTRAVENOUS

## 2019-11-21 MED ORDER — ETOMIDATE 2 MG/ML IV SOLN
INTRAVENOUS | Status: DC | PRN
  Administered 2019-11-21: 10 mg via INTRAVENOUS

## 2019-11-21 MED ORDER — TRAMADOL HCL 50 MG PO TABS: 50.0000 mg | ORAL_TABLET | Freq: Four times a day (QID) | ORAL | Status: DC | PRN

## 2019-11-21 MED ORDER — SODIUM CHLORIDE FLUSH 0.9 % IV SOLN
INTRAVENOUS | Status: AC
  Filled 2019-11-21: qty 10

## 2019-11-21 SURGICAL SUPPLY — 58 items
ANCHOR TIS RET SYS 235ML (MISCELLANEOUS) ×2 IMPLANT
BAG INFUSER PRESSURE 100CC (MISCELLANEOUS) ×2 IMPLANT
BLADE SURG SZ11 CARB STEEL (BLADE) ×2 IMPLANT
CANISTER SUCT 1200ML W/VALVE (MISCELLANEOUS) ×2 IMPLANT
CANNULA REDUC XI 12-8 STAPL (CANNULA) ×1
CANNULA REDUCER 12-8 DVNC XI (CANNULA) ×1 IMPLANT
CATH REDDICK CHOLANGI 4FR 50CM (CATHETERS) IMPLANT
CHLORAPREP W/TINT 26 (MISCELLANEOUS) ×2 IMPLANT
CLIP VESOLOCK MED LG 6/CT (CLIP) ×2 IMPLANT
COVER TIP SHEARS 8 DVNC (MISCELLANEOUS) ×1 IMPLANT
COVER TIP SHEARS 8MM DA VINCI (MISCELLANEOUS) ×1
COVER WAND RF STERILE (DRAPES) ×2 IMPLANT
DECANTER SPIKE VIAL GLASS SM (MISCELLANEOUS) ×4 IMPLANT
DEFOGGER SCOPE WARMER CLEARIFY (MISCELLANEOUS) ×2 IMPLANT
DERMABOND ADVANCED (GAUZE/BANDAGES/DRESSINGS) ×1
DERMABOND ADVANCED .7 DNX12 (GAUZE/BANDAGES/DRESSINGS) ×1 IMPLANT
DRAPE ARM DVNC X/XI (DISPOSABLE) ×4 IMPLANT
DRAPE C-ARM XRAY 36X54 (DRAPES) IMPLANT
DRAPE COLUMN DVNC XI (DISPOSABLE) ×1 IMPLANT
DRAPE DA VINCI XI ARM (DISPOSABLE) ×4
DRAPE DA VINCI XI COLUMN (DISPOSABLE) ×1
DRSG TEGADERM 4X4.75 (GAUZE/BANDAGES/DRESSINGS) ×2 IMPLANT
ELECT CAUTERY BLADE 6.4 (BLADE) IMPLANT
ELECT REM PT RETURN 9FT ADLT (ELECTROSURGICAL) ×2
ELECTRODE REM PT RTRN 9FT ADLT (ELECTROSURGICAL) ×1 IMPLANT
GLOVE BIOGEL PI IND STRL 7.0 (GLOVE) ×8 IMPLANT
GLOVE BIOGEL PI INDICATOR 7.0 (GLOVE) ×8
GLOVE SURG SYN 6.5 ES PF (GLOVE) ×12 IMPLANT
GOWN STRL REUS W/ TWL LRG LVL3 (GOWN DISPOSABLE) ×3 IMPLANT
GOWN STRL REUS W/TWL LRG LVL3 (GOWN DISPOSABLE) ×3
GRASPER SUT TROCAR 14GX15 (MISCELLANEOUS) IMPLANT
IRRIGATOR SUCT 8 DISP DVNC XI (IRRIGATION / IRRIGATOR) ×1 IMPLANT
IRRIGATOR SUCTION 8MM XI DISP (IRRIGATION / IRRIGATOR) ×1
IV NS 1000ML (IV SOLUTION)
IV NS 1000ML BAXH (IV SOLUTION) IMPLANT
JACKSON PRATT 7MM (INSTRUMENTS) ×2 IMPLANT
LABEL OR SOLS (LABEL) ×2 IMPLANT
NEEDLE HYPO 22GX1.5 SAFETY (NEEDLE) ×2 IMPLANT
NEEDLE INSUFFLATION 14GA 120MM (NEEDLE) ×2 IMPLANT
NS IRRIG 500ML POUR BTL (IV SOLUTION) ×2 IMPLANT
OBTURATOR OPTICAL STANDARD 8MM (TROCAR) ×1
OBTURATOR OPTICAL STND 8 DVNC (TROCAR) ×1
OBTURATOR OPTICALSTD 8 DVNC (TROCAR) ×1 IMPLANT
PACK LAP CHOLECYSTECTOMY (MISCELLANEOUS) ×2 IMPLANT
PENCIL ELECTRO HAND CTR (MISCELLANEOUS) ×2 IMPLANT
SEAL CANN UNIV 5-8 DVNC XI (MISCELLANEOUS) ×3 IMPLANT
SEAL XI 5MM-8MM UNIVERSAL (MISCELLANEOUS) ×3
SET TUBE SMOKE EVAC HIGH FLOW (TUBING) ×2 IMPLANT
SOLUTION ELECTROLUBE (MISCELLANEOUS) ×2 IMPLANT
STAPLER CANNULA SEAL DVNC XI (STAPLE) ×1 IMPLANT
STAPLER CANNULA SEAL XI (STAPLE) ×1
SUT ETHILON 4 0 P 3 18 (SUTURE) ×2 IMPLANT
SUT MNCRL 4-0 (SUTURE) ×2
SUT MNCRL 4-0 27XMFL (SUTURE) ×2
SUT VICRYL 0 AB UR-6 (SUTURE) ×2 IMPLANT
SUTURE MNCRL 4-0 27XMF (SUTURE) ×2 IMPLANT
SYR 30ML LL (SYRINGE) IMPLANT
TOWEL OR 17X26 4PK STRL BLUE (TOWEL DISPOSABLE) ×2 IMPLANT

## 2019-11-21 NOTE — Anesthesia Procedure Notes (Signed)
Arterial Line Insertion Start/End10/09/2019 10:15 AM, 11/21/2019 10:25 AM Performed by: Arita Miss, MD, anesthesiologist  Patient location: OR. Preanesthetic checklist: patient identified, IV checked, site marked, risks and benefits discussed, surgical consent, monitors and equipment checked, pre-op evaluation, timeout performed and anesthesia consent Lidocaine 1% used for infiltration and patient sedated Left, radial was placed Catheter size: 20 Fr Hand hygiene performed  and maximum sterile barriers used   Attempts: 1 Procedure performed using ultrasound guided technique. Ultrasound Notes:anatomy identified, needle tip was noted to be adjacent to the nerve/plexus identified and no ultrasound evidence of intravascular and/or intraneural injection Following insertion, dressing applied. Post procedure assessment: normal and unchanged  Post procedure complications: local hematoma. Patient tolerated the procedure well with no immediate complications. Additional procedure comments: Pre induction arterial line. Small hematoma at entry site but good arterial waveform; pressure dressing placed over site.Marland Kitchen

## 2019-11-21 NOTE — Anesthesia Preprocedure Evaluation (Signed)
Anesthesia Evaluation  Patient identified by MRN, date of birth, ID band Patient awake    Reviewed: Allergy & Precautions, NPO status , Patient's Chart, lab work & pertinent test results  History of Anesthesia Complications Negative for: history of anesthetic complications  Airway Mallampati: II  TM Distance: >3 FB Neck ROM: Full    Dental no notable dental hx. (+) Teeth Intact, Dental Advisory Given   Pulmonary shortness of breath, neg sleep apnea, neg COPD, Patient abstained from smoking.Not current smoker, former smoker,    Pulmonary exam normal breath sounds clear to auscultation       Cardiovascular Exercise Tolerance: Good METShypertension, + CAD, + Past MI, + Cardiac Stents, + CABG and +CHF  (-) dysrhythmias + pacemaker + Valvular Problems/Murmurs MR  Rhythm:Regular Rate:Normal - Systolic murmurs Patient claims able to climb a flight of stairs without issue.  ECHOCARDIOGRAM done on 07/24/2019 1. LVEF <15% 2. Severe LV systolic dysfunction 3. Normal RV systolic function 4. Mild AR, TR; moderate MR; trivial PR 5. Stable appearing AV bioprosthesis; mean gradient 11.6 mmHg 6. No valvular stenosis  Per cardiology, deemed acceptable risk, no further optimization   Neuro/Psych negative neurological ROS  negative psych ROS   GI/Hepatic GERD  ,(+)     (-) substance abuse  ,   Endo/Other  diabetesHypothyroidism   Renal/GU negative Renal ROS     Musculoskeletal   Abdominal   Peds  Hematology  (+) Blood dyscrasia, , Chronic thrombocytopenia; denies hx of easy bleeding or bruising   Anesthesia Other Findings Past Medical History: No date: AAA (abdominal aortic aneurysm) ([facility]) No date: Arrhythmia     Comment:  atrial fibrillation No date: Arthritis No date: BPH (benign prostatic hyperplasia) No date: Cancer ([place])     Comment:  skin No date: CHF (congestive heart failure) ([facility]) No date: Coronary artery  disease No date: DDD (degenerative disc disease), thoracic No date: Diabetes mellitus without complication ([facility]) No date: Esophageal stricture No date: GERD (gastroesophageal reflux disease) No date: Heart murmur No date: Hyperlipidemia No date: Hypertension No date: Hypothyroidism No date: Myocardial infarction ([place]) No date: Presence of permanent cardiac pacemaker No date: Shortness of breath dyspnea  Reproductive/Obstetrics                            Anesthesia Physical Anesthesia Plan  ASA: IV  Anesthesia Plan: General   Post-op Pain Management:    Induction: Intravenous  PONV Risk Score and Plan: 3 and Ondansetron, Dexamethasone and Treatment may vary due to age or medical condition  Airway Management Planned: Oral ETT  Additional Equipment: Arterial line  Intra-op Plan:   Post-operative Plan: Extubation in OR and Post-operative intubation/ventilation  Informed Consent: I have reviewed the patients History and Physical, chart, labs and discussed the procedure including the risks, benefits and alternatives for the proposed anesthesia with the patient or authorized representative who has indicated his/her understanding and acceptance.     Dental advisory given  Plan Discussed with: CRNA and Surgeon  Anesthesia Plan Comments: (Discussed risks of anesthesia with patient, including PONV, sore throat, lip/dental damage. Rare risks discussed as well, such as cardiorespiratory and neurological sequelae. Patient understands. Patient counseled on being higher risk for anesthesia due to comorbidities: severe CHF; I expressed concerns to surgeon. Patient was told about increased risk of cardiac and respiratory events, including death. Patient understands. Plan for defibrillator pads, magnet, and pre induction arterial line.  Patient explicitly stated his desire not  to be on prolonged ventilation/ vent dependent post operatively. He is OK with chest  compressions if necessary. Understands possible need for temporary ventilation immediately post op and is OK with it.)        Anesthesia Quick Evaluation

## 2019-11-21 NOTE — Anesthesia Procedure Notes (Signed)
Procedure Name: Intubation Date/Time: 11/21/2019 10:09 AM Performed by: Kelton Pillar, CRNA Pre-anesthesia Checklist: Patient identified, Emergency Drugs available, Suction available and Patient being monitored Patient Re-evaluated:Patient Re-evaluated prior to induction Oxygen Delivery Method: Circle system utilized Preoxygenation: Pre-oxygenation with 100% oxygen Induction Type: IV induction Ventilation: Mask ventilation without difficulty Laryngoscope Size: McGraph and 4 Grade View: Grade I Tube type: Oral Tube size: 7.5 mm Number of attempts: 1 Airway Equipment and Method: Stylet and Oral airway Placement Confirmation: ETT inserted through vocal cords under direct vision,  positive ETCO2 and breath sounds checked- equal and bilateral Tube secured with: Tape Dental Injury: Teeth and Oropharynx as per pre-operative assessment

## 2019-11-21 NOTE — Interval H&P Note (Signed)
History and Physical Interval Note:  11/21/2019 9:09 AM  Derrick Cochran  has presented today for surgery, with the diagnosis of K81.0 Acute cholecystitis.  The various methods of treatment have been discussed with the patient and family. After consideration of risks, benefits and other options for treatment, the patient has consented to  Procedure(s): XI ROBOTIC [place] (N/A) as a surgical intervention.  The patient's history has been reviewed, patient examined, no change in status, stable for surgery.  I have reviewed the patient's chart and labs.    We had another extensive discussion about r/b/a to surgery.  Anesthesia voiced concerns about his heart status, and believes there is significant risk involved proceeding with surgery.  Discussion with cardiologist again this am, confirming he believes patient remains low to moderate risk.  I explained to patient and wife that they should consider this surgery's primary objective as alleviating him from the burden of living with a cholecystostomy tube, and consider if that is worth potentially dying for.  Both husband and wife stated it is "god's will" and they will still like to proceed.    Pending plt count to ensure not significantly low, but will proceed with surgery as originally planned if >70.    Questions were answered to the patient's satisfaction.     Derrick Cochran

## 2019-11-21 NOTE — Discharge Instructions (Signed)
Laparoscopic Cholecystectomy, Care After This sheet gives you information about how to care for yourself after your procedure. Your doctor may also give you more specific instructions. If you have problems or questions, contact your doctor. Follow these instructions at home: Care for cuts from surgery (incisions)   Follow instructions from your doctor about how to take care of your cuts from surgery. Make sure you: ? Wash your hands with soap and water before you change your bandage (dressing). If you cannot use soap and water, use hand sanitizer. ? CHANGE DRAIN DRESSING IN 24HRS.  KEEP IT COVERED UNTIL FOLLOWUP.  RECORD DAILY OUTPUTS AND BRING TO POSTOP APPOINTMENT ? Leave stitches (sutures), skin glue, or skin tape (adhesive) strips in place. They may need to stay in place for 2 weeks or longer. If tape strips get loose and curl up, you may trim the loose edges. Do not remove tape strips completely unless your doctor says it is okay.  Do not take baths, swim, or use a hot tub until your doctor says it is okay. OK TO SHOWER 24HRS AFTER YOUR SURGERY.   Check your surgical cut area every day for signs of infection. Check for: ? More redness, swelling, or pain. ? More fluid or blood. ? Warmth. ? Pus or a bad smell. Activity  Do not drive or use heavy machinery while taking prescription pain medicine.  Do not play contact sports until your doctor says it is okay.  Do not drive for 24 hours if you were given a medicine to help you relax (sedative).  Rest as needed. Do not return to work or school until your doctor says it is okay. General instructions . HOLD WARFARIN UNTIL SEEN IN OFFICE . tylenol and advil as needed for discomfort.  Please alternate between the two every four hours as needed for pain.   .  Use narcotics, if prescribed, only when tylenol and motrin is not enough to control pain. .  325-650mg  every 8hrs to max of 3000mg /24hrs (including the 325mg  in every norco dose) for  the tylenol.   .  Advil up to 800mg  per dose every 8hrs as needed for pain.    To prevent or treat constipation while you are taking prescription pain medicine, your doctor may recommend that you: ? Drink enough fluid to keep your pee (urine) clear or pale yellow. ? Take over-the-counter or prescription medicines. ? Eat foods that are high in fiber, such as fresh fruits and vegetables, whole grains, and beans. ? Limit foods that are high in fat and processed sugars, such as fried and sweet foods. Contact a doctor if:  You develop a rash.  You have more redness, swelling, or pain around your surgical cuts.  You have more fluid or blood coming from your surgical cuts.  Your surgical cuts feel warm to the touch.  You have pus or a bad smell coming from your surgical cuts.  You have a fever.  One or more of your surgical cuts breaks open. Get help right away if:  You have trouble breathing.  You have chest pain.  You have pain that is getting worse in your shoulders.  You faint or feel dizzy when you stand.  You have very bad pain in your belly (abdomen).  You are sick to your stomach (nauseous) for more than one day.  You have throwing up (vomiting) that lasts for more than one day.  You have leg pain. This information is not intended to replace advice  given to you by your health care provider. Make sure you discuss any questions you have with your health care provider. Document Released: 11/09/2007 Document Revised: 08/21/2015 Document Reviewed: 07/19/2015 Elsevier Interactive Patient Education  2019 [employer].

## 2019-11-21 NOTE — Anesthesia Postprocedure Evaluation (Signed)
Anesthesia Post Note  Patient: Derrick Cochran  Procedure(s) Performed: XI ROBOTIC ASSISTED LAPAROSCOPIC CHOLECYSTECTOMY (N/A Abdomen)  Patient location during evaluation: PACU Anesthesia Type: General Level of consciousness: awake and alert Pain management: pain level controlled Vital Signs Assessment: post-procedure vital signs reviewed and stable Respiratory status: spontaneous breathing, nonlabored ventilation, respiratory function stable and patient connected to nasal cannula oxygen Cardiovascular status: blood pressure returned to baseline and stable Postop Assessment: no apparent nausea or vomiting Anesthetic complications: no   No complications documented.   Last Vitals:  Vitals:   11/21/19 1358 11/21/19 1406  BP: 125/60 (!) 113/48  Pulse: 62 (!) 58  Resp: (!) 27 (!) 21  Temp:    SpO2: 97% 97%    Last Pain:  Vitals:   11/21/19 1406  TempSrc:   PainSc: Asleep                 Arita Miss

## 2019-11-21 NOTE — Transfer of Care (Signed)
Immediate Anesthesia Transfer of Care Note  Patient: Derrick Cochran  Procedure(s) Performed: XI ROBOTIC ASSISTED LAPAROSCOPIC CHOLECYSTECTOMY (N/A Abdomen)  Patient Location: PACU  Anesthesia Type:General  Level of Consciousness: drowsy  Airway & Oxygen Therapy: Patient Spontanous Breathing and Patient connected to face mask oxygen  Post-op Assessment: Report given to RN  Post vital signs: stable  Last Vitals:  Vitals Value Taken Time  BP 129/60 11/21/19 1342  Temp    Pulse 61 11/21/19 1342  Resp 21 11/21/19 1342  SpO2 97 % 11/21/19 1342  Vitals shown include unvalidated device data.  Last Pain:  Vitals:   11/21/19 0813  TempSrc: Temporal  PainSc: 2          Complications: No complications documented.

## 2019-11-21 NOTE — Op Note (Signed)
Preoperative diagnosis:  chronic and cholecystitis  Postoperative diagnosis: same as above  Procedure: Robotic assisted Laparoscopic Cholecystectomy.   Anesthesia: GETA   Surgeon: Benjamine Sprague  Specimen: Gallbladder  Complications: None  EBL: 59mL  Wound Classification: Clean Contaminated  Indications: see HPI  Findings: Critical view of safety noted Cystic duct and artery identified, ligated and divided, clips remained intact at end of procedure Adequate hemostasis  Description of procedure:  The patient was placed on the operating table in the supine position. SCDs placed, pre-op abx administered.  General anesthesia was induced and OG tube placed by anesthesia. A time-out was completed verifying correct patient, procedure, site, positioning, and implant(s) and/or special equipment prior to beginning this procedure. The abdomen was prepped and draped in the usual sterile fashion.    Veress needle was placed at the Palmer's point and insufflation was started after confirming a positive saline drop test and no immediate increase in abdominal pressure.  After reaching 15 mm, the Veress needle was removed and a 8 mm port was placed via optiview technique under umbilicus measured 29JM from gallbladder.  The abdomen was inspected and no abnormalities or injuries were found.  Under direct vision, ports were placed in the following locations: One 12 mm patient left of the umbilicus, 8cm from the optiviewed port, one 8 mm port placed to the patient right of the umbilical port 8 cm apart.  1 additional 8 mm port placed lateral to the 31mm port.  Once ports were placed, The table was placed in the reverse Trendelenburg position with the right side up. The Xi platform was brought into the operative field and docked to the ports successfully.  An endoscope was placed through the umbilical port, fenestrated grasper through the adjacent patient right port, prograsp to the far patient left port, and  then a hook cautery in the left port.  The dome of the gallbladder was grasped with prograsp, passed and retracted over the dome of the liver.  Thick adhesions between the gallbladder and omentum, duodenum and transverse colon were lysed via hook cautery. The infundibulum was grasped with the fenestrated grasper and retracted toward the right lower quadrant. This maneuver exposed Calot's triangle. The thick peritoneum overlying the gallbladder infundibulum was then meticulously dissected using combination of [place] dissector and electrocautery hook and the cystic duct and cystic artery eventually identified.  Critical view of safety with the liver bed clearly visible behind the duct and artery with no additional structures noted.  The cystic duct and cystic artery clipped and divided close to the gallbladder.    Cholecystostomy tube then removed at bedside.   The gallbladder was then dissected from its peritoneal and liver bed attachments by electrocautery.  A portion of the posterior wall had to be left behind due to dense adhesions and previous trauma from the transhepatic cholecystostomy tube placement. hemostasis was checked prior to removing the hook cautery and the Endo Catch bag was then placed through the 12 mm port and the gallbladder was removed.  The gallbladder was passed off the table as a specimen. There was no evidence of bleeding from the gallbladder fossa or cystic artery or leakage of the bile from the cystic duct stump.  The operative field was then copiously irrigated and all spillage from the gallbladder contents suctioned out.  JP drain was then placed through the right lower quadrant port, secured to the skin with 3-0 nylon.  Drain was placed due to to the bile spillage Intra-Op while dissecting the  gallbladder off the liver bed..  The 12 mm port site closed with PMI using 0 vicryl under direct vision.  Abdomen desufflated and secondary trocars were removed under direct vision. No  bleeding was noted. All skin incisions then closed with subcuticular sutures of 4-0 monocryl and dressed with topical skin adhesive. The orogastric tube was removed and patient extubated.  The patient tolerated the procedure well and was taken to the postanesthesia care unit in stable condition.  All sponge and instrument count correct at end of procedure.

## 2019-11-22 DIAGNOSIS — Z7984 Long term (current) use of oral hypoglycemic drugs: Secondary | ICD-10-CM | POA: Diagnosis not present

## 2019-11-22 DIAGNOSIS — K811 Chronic cholecystitis: Secondary | ICD-10-CM | POA: Diagnosis not present

## 2019-11-22 NOTE — Care Plan (Signed)
Pt and wife given AVS at this time. Pt and wife deny questions. Drain care and education gone over at this time with wife. IVs removed.

## 2019-11-22 NOTE — Discharge Summary (Signed)
Physician Discharge Summary  Patient ID: Derrick Cochran MRN: [id number] DOB/AGE: May 30, 1935 84 y.o.  Admit date: 11/21/2019 Discharge date: 11/22/2019  Admission Diagnoses:  Discharge Diagnoses:  Active Problems:   Chronic cholecystitis   Discharged Condition: good  Hospital Course: Overnight observation postop.  Uneventful.  Patient tolerated diet pain well controlled.  Hemodynamically stable and without respiratory issue.  Drain noted to be serosanguineous fluid as expected.   Treatments: surgery: Robotic cholecystectomy.  Discharge Exam: Blood pressure (!) 109/50, pulse 69, temperature 99.1 F (37.3 C), temperature source Oral, resp. rate 20, height 5\' 10"  (1.778 m), weight 69.9 kg, SpO2 90 %. GI: soft, non-tender; bowel sounds normal; no masses,  no organomegaly  Disposition: Discharge disposition: 01-Home or Self Care        Allergies as of 11/22/2019      Reactions   Adhesive [tape] Rash   Ok with Tegaderm    Cefdinir Other (See Comments)   nightmares   Sulfa Antibiotics Rash   Sulfamethoxazole Hives, Itching, Rash      Medication List    STOP taking these medications   warfarin 1 MG tablet Commonly known as: COUMADIN   warfarin 3 MG tablet Commonly known as: COUMADIN     TAKE these medications   acetaminophen 500 MG tablet Commonly known as: TYLENOL Take 500 mg by mouth every 6 (six) hours as needed for mild pain, moderate pain or fever.   docusate sodium 100 MG capsule Commonly known as: Colace Take 1 capsule (100 mg total) by mouth 2 (two) times daily as needed for up to 10 days for mild constipation.   enalapril 5 MG tablet Commonly known as: VASOTEC Take 1 tablet (5 mg total) by mouth daily.   furosemide 20 MG tablet Commonly known as: LASIX Take 1 tablet (20 mg total) by mouth 2 (two) times daily. What changed:   when to take this  additional instructions   gabapentin 300 MG capsule Commonly known as: NEURONTIN Take 300 mg by  mouth at bedtime.   glipiZIDE 5 MG 24 hr tablet Commonly known as: GLUCOTROL XL Take 5 mg by mouth daily with breakfast.   HYDROcodone-acetaminophen 5-325 MG tablet Commonly known as: Norco Take 1 tablet by mouth every 6 (six) hours as needed for up to 6 doses for moderate pain.   levocetirizine 5 MG tablet Commonly known as: XYZAL Take 5 mg by mouth every evening.   levothyroxine 150 MCG tablet Commonly known as: SYNTHROID Take 150 mcg by mouth daily before breakfast.   metFORMIN 500 MG tablet Commonly known as: GLUCOPHAGE Take 500 mg by mouth 2 (two) times daily.   metoprolol succinate 50 MG 24 hr tablet Commonly known as: TOPROL-XL Take 50 mg by mouth daily.   nitroGLYCERIN 0.4 MG SL tablet Commonly known as: NITROSTAT Place 0.4 mg under the tongue every 5 (five) minutes as needed for chest pain.   omeprazole 20 MG capsule Commonly known as: PRILOSEC Take 20 mg by mouth daily.   simvastatin 40 MG tablet Commonly known as: ZOCOR Take 40 mg by mouth every evening.   tamsulosin 0.4 MG Caps capsule Commonly known as: FLOMAX Take 0.4 mg by mouth every morning.   temazepam 30 MG capsule Commonly known as: RESTORIL Take 30-60 mg by mouth at bedtime as needed for sleep.       Follow-up Information    Follow up In 1 week.   Why: post op lap chole  Signed: Ronny Bacon, M.D., [facility] 11/22/2019, 4:19 PM

## 2019-11-22 NOTE — Plan of Care (Signed)

## 2019-11-24 LAB — SURGICAL PATHOLOGY

## 2019-11-26 DIAGNOSIS — M47816 Spondylosis without myelopathy or radiculopathy, lumbar region: Secondary | ICD-10-CM | POA: Diagnosis not present

## 2019-11-26 DIAGNOSIS — E119 Type 2 diabetes mellitus without complications: Secondary | ICD-10-CM | POA: Diagnosis not present

## 2019-11-26 DIAGNOSIS — Z434 Encounter for attention to other artificial openings of digestive tract: Secondary | ICD-10-CM | POA: Diagnosis not present

## 2019-11-26 DIAGNOSIS — I110 Hypertensive heart disease with heart failure: Secondary | ICD-10-CM | POA: Diagnosis not present

## 2019-11-26 DIAGNOSIS — K819 Cholecystitis, unspecified: Secondary | ICD-10-CM | POA: Diagnosis not present

## 2019-11-26 DIAGNOSIS — I482 Chronic atrial fibrillation, unspecified: Secondary | ICD-10-CM | POA: Diagnosis not present

## 2019-11-26 DIAGNOSIS — I5022 Chronic systolic (congestive) heart failure: Secondary | ICD-10-CM | POA: Diagnosis not present

## 2019-11-26 DIAGNOSIS — I350 Nonrheumatic aortic (valve) stenosis: Secondary | ICD-10-CM | POA: Diagnosis not present

## 2019-11-26 DIAGNOSIS — I255 Ischemic cardiomyopathy: Secondary | ICD-10-CM | POA: Diagnosis not present

## 2019-12-01 DIAGNOSIS — I5022 Chronic systolic (congestive) heart failure: Secondary | ICD-10-CM | POA: Diagnosis not present

## 2019-12-01 DIAGNOSIS — Z434 Encounter for attention to other artificial openings of digestive tract: Secondary | ICD-10-CM | POA: Diagnosis not present

## 2019-12-01 DIAGNOSIS — I110 Hypertensive heart disease with heart failure: Secondary | ICD-10-CM | POA: Diagnosis not present

## 2019-12-01 DIAGNOSIS — M47816 Spondylosis without myelopathy or radiculopathy, lumbar region: Secondary | ICD-10-CM | POA: Diagnosis not present

## 2019-12-01 DIAGNOSIS — I255 Ischemic cardiomyopathy: Secondary | ICD-10-CM | POA: Diagnosis not present

## 2019-12-01 DIAGNOSIS — I350 Nonrheumatic aortic (valve) stenosis: Secondary | ICD-10-CM | POA: Diagnosis not present

## 2019-12-01 DIAGNOSIS — E119 Type 2 diabetes mellitus without complications: Secondary | ICD-10-CM | POA: Diagnosis not present

## 2019-12-01 DIAGNOSIS — K819 Cholecystitis, unspecified: Secondary | ICD-10-CM | POA: Diagnosis not present

## 2019-12-01 DIAGNOSIS — I482 Chronic atrial fibrillation, unspecified: Secondary | ICD-10-CM | POA: Diagnosis not present

## 2019-12-04 DIAGNOSIS — I480 Paroxysmal atrial fibrillation: Secondary | ICD-10-CM | POA: Diagnosis not present

## 2019-12-08 ENCOUNTER — Ambulatory Visit: Payer: Medicare HMO | Attending: Internal Medicine

## 2019-12-08 DIAGNOSIS — Z23 Encounter for immunization: Secondary | ICD-10-CM

## 2019-12-08 NOTE — Progress Notes (Signed)
   Covid-19 Vaccination Clinic  Name:  Derrick Cochran    MRN: [id number] DOB: Jan 26, 1936  12/08/2019  [name] was observed post Covid-19 immunization for 15 minutes without incident. He was provided with Vaccine Information Sheet and instruction to access the V-Safe system.   Mr. Want was instructed to call 911 with any severe reactions post vaccine: Marland Kitchen Difficulty breathing  . Swelling of face and throat  . A fast heartbeat  . A bad rash all over body  . Dizziness and weakness

## 2019-12-11 DIAGNOSIS — Z79899 Other long term (current) drug therapy: Secondary | ICD-10-CM | POA: Diagnosis not present

## 2019-12-11 DIAGNOSIS — I482 Chronic atrial fibrillation, unspecified: Secondary | ICD-10-CM | POA: Diagnosis not present

## 2019-12-18 LAB — TYPE AND SCREEN
ABO/RH(D): A POS
Antibody Screen: NEGATIVE

## 2019-12-30 DIAGNOSIS — I495 Sick sinus syndrome: Secondary | ICD-10-CM | POA: Diagnosis not present

## 2020-01-01 DIAGNOSIS — I480 Paroxysmal atrial fibrillation: Secondary | ICD-10-CM | POA: Diagnosis not present

## 2020-01-26 DIAGNOSIS — I495 Sick sinus syndrome: Secondary | ICD-10-CM | POA: Diagnosis not present

## 2020-01-26 DIAGNOSIS — I214 Non-ST elevation (NSTEMI) myocardial infarction: Secondary | ICD-10-CM | POA: Diagnosis not present

## 2020-01-26 DIAGNOSIS — I350 Nonrheumatic aortic (valve) stenosis: Secondary | ICD-10-CM | POA: Diagnosis not present

## 2020-01-26 DIAGNOSIS — I714 Abdominal aortic aneurysm, without rupture: Secondary | ICD-10-CM | POA: Diagnosis not present

## 2020-01-26 DIAGNOSIS — I482 Chronic atrial fibrillation, unspecified: Secondary | ICD-10-CM | POA: Diagnosis not present

## 2020-01-26 DIAGNOSIS — I2119 ST elevation (STEMI) myocardial infarction involving other coronary artery of inferior wall: Secondary | ICD-10-CM | POA: Diagnosis not present

## 2020-01-26 DIAGNOSIS — I10 Essential (primary) hypertension: Secondary | ICD-10-CM | POA: Diagnosis not present

## 2020-01-26 DIAGNOSIS — I4891 Unspecified atrial fibrillation: Secondary | ICD-10-CM | POA: Diagnosis not present

## 2020-01-26 DIAGNOSIS — I4821 Permanent atrial fibrillation: Secondary | ICD-10-CM | POA: Diagnosis not present

## 2020-01-27 DIAGNOSIS — I10 Essential (primary) hypertension: Secondary | ICD-10-CM | POA: Diagnosis not present

## 2020-01-27 DIAGNOSIS — I482 Chronic atrial fibrillation, unspecified: Secondary | ICD-10-CM | POA: Diagnosis not present

## 2020-01-27 DIAGNOSIS — E118 Type 2 diabetes mellitus with unspecified complications: Secondary | ICD-10-CM | POA: Diagnosis not present

## 2020-01-27 DIAGNOSIS — E785 Hyperlipidemia, unspecified: Secondary | ICD-10-CM | POA: Diagnosis not present

## 2020-01-27 DIAGNOSIS — Z79899 Other long term (current) drug therapy: Secondary | ICD-10-CM | POA: Diagnosis not present

## 2020-01-27 DIAGNOSIS — E039 Hypothyroidism, unspecified: Secondary | ICD-10-CM | POA: Diagnosis not present

## 2020-01-29 DIAGNOSIS — E785 Hyperlipidemia, unspecified: Secondary | ICD-10-CM | POA: Diagnosis not present

## 2020-01-29 DIAGNOSIS — E118 Type 2 diabetes mellitus with unspecified complications: Secondary | ICD-10-CM | POA: Diagnosis not present

## 2020-01-29 DIAGNOSIS — I10 Essential (primary) hypertension: Secondary | ICD-10-CM | POA: Diagnosis not present

## 2020-01-29 DIAGNOSIS — I482 Chronic atrial fibrillation, unspecified: Secondary | ICD-10-CM | POA: Diagnosis not present

## 2020-01-29 DIAGNOSIS — Z79899 Other long term (current) drug therapy: Secondary | ICD-10-CM | POA: Diagnosis not present

## 2020-01-29 DIAGNOSIS — E039 Hypothyroidism, unspecified: Secondary | ICD-10-CM | POA: Diagnosis not present

## 2020-02-12 DIAGNOSIS — I480 Paroxysmal atrial fibrillation: Secondary | ICD-10-CM | POA: Diagnosis not present

## 2020-03-11 DIAGNOSIS — I480 Paroxysmal atrial fibrillation: Secondary | ICD-10-CM | POA: Diagnosis not present

## 2020-03-31 DIAGNOSIS — I480 Paroxysmal atrial fibrillation: Secondary | ICD-10-CM | POA: Diagnosis not present

## 2020-04-30 DIAGNOSIS — I480 Paroxysmal atrial fibrillation: Secondary | ICD-10-CM | POA: Diagnosis not present

## 2020-05-13 DIAGNOSIS — I10 Essential (primary) hypertension: Secondary | ICD-10-CM | POA: Diagnosis not present

## 2020-05-13 DIAGNOSIS — E079 Disorder of thyroid, unspecified: Secondary | ICD-10-CM | POA: Diagnosis not present

## 2020-05-13 DIAGNOSIS — E785 Hyperlipidemia, unspecified: Secondary | ICD-10-CM | POA: Diagnosis not present

## 2020-05-13 DIAGNOSIS — I4891 Unspecified atrial fibrillation: Secondary | ICD-10-CM | POA: Diagnosis not present

## 2020-05-13 DIAGNOSIS — Z0000 Encounter for general adult medical examination without abnormal findings: Secondary | ICD-10-CM | POA: Diagnosis not present

## 2020-05-13 DIAGNOSIS — Z79899 Other long term (current) drug therapy: Secondary | ICD-10-CM | POA: Diagnosis not present

## 2020-05-13 DIAGNOSIS — E119 Type 2 diabetes mellitus without complications: Secondary | ICD-10-CM | POA: Diagnosis not present

## 2020-05-27 DIAGNOSIS — Z79899 Other long term (current) drug therapy: Secondary | ICD-10-CM | POA: Diagnosis not present

## 2020-05-27 DIAGNOSIS — I480 Paroxysmal atrial fibrillation: Secondary | ICD-10-CM | POA: Diagnosis not present

## 2020-05-27 DIAGNOSIS — E039 Hypothyroidism, unspecified: Secondary | ICD-10-CM | POA: Diagnosis not present

## 2020-06-01 DIAGNOSIS — I4821 Permanent atrial fibrillation: Secondary | ICD-10-CM | POA: Diagnosis not present

## 2020-06-01 DIAGNOSIS — I482 Chronic atrial fibrillation, unspecified: Secondary | ICD-10-CM | POA: Diagnosis not present

## 2020-06-01 DIAGNOSIS — Z951 Presence of aortocoronary bypass graft: Secondary | ICD-10-CM | POA: Diagnosis not present

## 2020-06-01 DIAGNOSIS — I2119 ST elevation (STEMI) myocardial infarction involving other coronary artery of inferior wall: Secondary | ICD-10-CM | POA: Diagnosis not present

## 2020-06-01 DIAGNOSIS — I714 Abdominal aortic aneurysm, without rupture: Secondary | ICD-10-CM | POA: Diagnosis not present

## 2020-06-01 DIAGNOSIS — I5022 Chronic systolic (congestive) heart failure: Secondary | ICD-10-CM | POA: Diagnosis not present

## 2020-06-01 DIAGNOSIS — I350 Nonrheumatic aortic (valve) stenosis: Secondary | ICD-10-CM | POA: Diagnosis not present

## 2020-06-01 DIAGNOSIS — Z9889 Other specified postprocedural states: Secondary | ICD-10-CM | POA: Diagnosis not present

## 2020-06-01 DIAGNOSIS — I10 Essential (primary) hypertension: Secondary | ICD-10-CM | POA: Diagnosis not present

## 2020-06-24 DIAGNOSIS — I480 Paroxysmal atrial fibrillation: Secondary | ICD-10-CM | POA: Diagnosis not present

## 2020-06-29 DIAGNOSIS — I495 Sick sinus syndrome: Secondary | ICD-10-CM | POA: Diagnosis not present

## 2020-07-22 DIAGNOSIS — I480 Paroxysmal atrial fibrillation: Secondary | ICD-10-CM | POA: Diagnosis not present

## 2020-08-20 DIAGNOSIS — I480 Paroxysmal atrial fibrillation: Secondary | ICD-10-CM | POA: Diagnosis not present

## 2020-08-30 DIAGNOSIS — I714 Abdominal aortic aneurysm, without rupture: Secondary | ICD-10-CM | POA: Diagnosis not present

## 2020-08-30 DIAGNOSIS — I480 Paroxysmal atrial fibrillation: Secondary | ICD-10-CM | POA: Diagnosis not present

## 2020-08-30 DIAGNOSIS — I482 Chronic atrial fibrillation, unspecified: Secondary | ICD-10-CM | POA: Diagnosis not present

## 2020-08-30 DIAGNOSIS — I5022 Chronic systolic (congestive) heart failure: Secondary | ICD-10-CM | POA: Diagnosis not present

## 2020-08-30 DIAGNOSIS — I350 Nonrheumatic aortic (valve) stenosis: Secondary | ICD-10-CM | POA: Diagnosis not present

## 2020-08-30 DIAGNOSIS — I255 Ischemic cardiomyopathy: Secondary | ICD-10-CM | POA: Diagnosis not present

## 2020-08-30 DIAGNOSIS — Z951 Presence of aortocoronary bypass graft: Secondary | ICD-10-CM | POA: Diagnosis not present

## 2020-08-30 DIAGNOSIS — I2119 ST elevation (STEMI) myocardial infarction involving other coronary artery of inferior wall: Secondary | ICD-10-CM | POA: Diagnosis not present

## 2020-08-30 DIAGNOSIS — Z9889 Other specified postprocedural states: Secondary | ICD-10-CM | POA: Diagnosis not present

## 2020-09-16 DIAGNOSIS — I480 Paroxysmal atrial fibrillation: Secondary | ICD-10-CM | POA: Diagnosis not present

## 2020-09-29 DIAGNOSIS — I5022 Chronic systolic (congestive) heart failure: Secondary | ICD-10-CM | POA: Diagnosis not present

## 2020-09-29 DIAGNOSIS — Z125 Encounter for screening for malignant neoplasm of prostate: Secondary | ICD-10-CM | POA: Diagnosis not present

## 2020-09-29 DIAGNOSIS — E118 Type 2 diabetes mellitus with unspecified complications: Secondary | ICD-10-CM | POA: Diagnosis not present

## 2020-09-29 DIAGNOSIS — Z953 Presence of xenogenic heart valve: Secondary | ICD-10-CM | POA: Diagnosis not present

## 2020-09-29 DIAGNOSIS — I10 Essential (primary) hypertension: Secondary | ICD-10-CM | POA: Diagnosis not present

## 2020-09-29 DIAGNOSIS — E039 Hypothyroidism, unspecified: Secondary | ICD-10-CM | POA: Diagnosis not present

## 2020-09-29 DIAGNOSIS — Z0000 Encounter for general adult medical examination without abnormal findings: Secondary | ICD-10-CM | POA: Diagnosis not present

## 2020-09-29 DIAGNOSIS — Z79899 Other long term (current) drug therapy: Secondary | ICD-10-CM | POA: Diagnosis not present

## 2020-09-29 DIAGNOSIS — I110 Hypertensive heart disease with heart failure: Secondary | ICD-10-CM | POA: Diagnosis not present

## 2020-09-29 DIAGNOSIS — I482 Chronic atrial fibrillation, unspecified: Secondary | ICD-10-CM | POA: Diagnosis not present

## 2020-09-29 DIAGNOSIS — E782 Mixed hyperlipidemia: Secondary | ICD-10-CM | POA: Diagnosis not present

## 2020-10-15 DIAGNOSIS — Z1211 Encounter for screening for malignant neoplasm of colon: Secondary | ICD-10-CM | POA: Diagnosis not present

## 2020-10-15 DIAGNOSIS — I480 Paroxysmal atrial fibrillation: Secondary | ICD-10-CM | POA: Diagnosis not present

## 2020-10-29 DIAGNOSIS — R710 Precipitous drop in hematocrit: Secondary | ICD-10-CM | POA: Diagnosis not present

## 2020-10-29 DIAGNOSIS — R944 Abnormal results of kidney function studies: Secondary | ICD-10-CM | POA: Diagnosis not present

## 2020-10-29 DIAGNOSIS — I480 Paroxysmal atrial fibrillation: Secondary | ICD-10-CM | POA: Diagnosis not present

## 2020-11-25 DIAGNOSIS — I480 Paroxysmal atrial fibrillation: Secondary | ICD-10-CM | POA: Diagnosis not present

## 2020-12-21 DIAGNOSIS — I495 Sick sinus syndrome: Secondary | ICD-10-CM | POA: Diagnosis not present

## 2020-12-29 DIAGNOSIS — I480 Paroxysmal atrial fibrillation: Secondary | ICD-10-CM | POA: Diagnosis not present

## 2021-01-03 DIAGNOSIS — E119 Type 2 diabetes mellitus without complications: Secondary | ICD-10-CM | POA: Diagnosis not present

## 2021-01-03 DIAGNOSIS — Z953 Presence of xenogenic heart valve: Secondary | ICD-10-CM | POA: Diagnosis not present

## 2021-01-03 DIAGNOSIS — I482 Chronic atrial fibrillation, unspecified: Secondary | ICD-10-CM | POA: Diagnosis not present

## 2021-01-03 DIAGNOSIS — I10 Essential (primary) hypertension: Secondary | ICD-10-CM | POA: Diagnosis not present

## 2021-01-03 DIAGNOSIS — Z79899 Other long term (current) drug therapy: Secondary | ICD-10-CM | POA: Diagnosis not present

## 2021-01-03 DIAGNOSIS — E039 Hypothyroidism, unspecified: Secondary | ICD-10-CM | POA: Diagnosis not present

## 2021-01-03 DIAGNOSIS — E782 Mixed hyperlipidemia: Secondary | ICD-10-CM | POA: Diagnosis not present

## 2021-01-28 DIAGNOSIS — E118 Type 2 diabetes mellitus with unspecified complications: Secondary | ICD-10-CM | POA: Diagnosis not present

## 2021-01-28 DIAGNOSIS — E039 Hypothyroidism, unspecified: Secondary | ICD-10-CM | POA: Diagnosis not present

## 2021-01-28 DIAGNOSIS — Z79899 Other long term (current) drug therapy: Secondary | ICD-10-CM | POA: Diagnosis not present

## 2021-01-28 DIAGNOSIS — I480 Paroxysmal atrial fibrillation: Secondary | ICD-10-CM | POA: Diagnosis not present

## 2021-02-02 DIAGNOSIS — I4891 Unspecified atrial fibrillation: Secondary | ICD-10-CM | POA: Diagnosis not present

## 2021-02-02 DIAGNOSIS — I5022 Chronic systolic (congestive) heart failure: Secondary | ICD-10-CM | POA: Diagnosis not present

## 2021-02-02 DIAGNOSIS — Z9889 Other specified postprocedural states: Secondary | ICD-10-CM | POA: Diagnosis not present

## 2021-02-02 DIAGNOSIS — I255 Ischemic cardiomyopathy: Secondary | ICD-10-CM | POA: Diagnosis not present

## 2021-02-02 DIAGNOSIS — I714 Abdominal aortic aneurysm, without rupture, unspecified: Secondary | ICD-10-CM | POA: Diagnosis not present

## 2021-02-02 DIAGNOSIS — Z951 Presence of aortocoronary bypass graft: Secondary | ICD-10-CM | POA: Diagnosis not present

## 2021-02-02 DIAGNOSIS — I214 Non-ST elevation (NSTEMI) myocardial infarction: Secondary | ICD-10-CM | POA: Diagnosis not present

## 2021-02-02 DIAGNOSIS — I482 Chronic atrial fibrillation, unspecified: Secondary | ICD-10-CM | POA: Diagnosis not present

## 2021-02-02 DIAGNOSIS — I4821 Permanent atrial fibrillation: Secondary | ICD-10-CM | POA: Diagnosis not present

## 2021-03-07 DIAGNOSIS — E538 Deficiency of other specified B group vitamins: Secondary | ICD-10-CM | POA: Diagnosis not present

## 2021-03-07 DIAGNOSIS — R4182 Altered mental status, unspecified: Secondary | ICD-10-CM | POA: Diagnosis not present

## 2021-03-07 DIAGNOSIS — I110 Hypertensive heart disease with heart failure: Secondary | ICD-10-CM | POA: Diagnosis not present

## 2021-03-07 DIAGNOSIS — E782 Mixed hyperlipidemia: Secondary | ICD-10-CM | POA: Diagnosis not present

## 2021-03-07 DIAGNOSIS — Z0000 Encounter for general adult medical examination without abnormal findings: Secondary | ICD-10-CM | POA: Diagnosis not present

## 2021-03-07 DIAGNOSIS — Z79899 Other long term (current) drug therapy: Secondary | ICD-10-CM | POA: Diagnosis not present

## 2021-03-07 DIAGNOSIS — I10 Essential (primary) hypertension: Secondary | ICD-10-CM | POA: Diagnosis not present

## 2021-03-07 DIAGNOSIS — Z953 Presence of xenogenic heart valve: Secondary | ICD-10-CM | POA: Diagnosis not present

## 2021-03-07 DIAGNOSIS — I4891 Unspecified atrial fibrillation: Secondary | ICD-10-CM | POA: Diagnosis not present

## 2021-03-08 ENCOUNTER — Other Ambulatory Visit: Payer: Self-pay | Admitting: Internal Medicine

## 2021-03-08 DIAGNOSIS — R4182 Altered mental status, unspecified: Secondary | ICD-10-CM | POA: Diagnosis not present

## 2021-03-08 DIAGNOSIS — E782 Mixed hyperlipidemia: Secondary | ICD-10-CM | POA: Diagnosis not present

## 2021-03-08 DIAGNOSIS — I110 Hypertensive heart disease with heart failure: Secondary | ICD-10-CM | POA: Diagnosis not present

## 2021-03-08 DIAGNOSIS — E538 Deficiency of other specified B group vitamins: Secondary | ICD-10-CM | POA: Diagnosis not present

## 2021-03-08 DIAGNOSIS — I4891 Unspecified atrial fibrillation: Secondary | ICD-10-CM | POA: Diagnosis not present

## 2021-03-08 DIAGNOSIS — I10 Essential (primary) hypertension: Secondary | ICD-10-CM | POA: Diagnosis not present

## 2021-03-08 DIAGNOSIS — Z953 Presence of xenogenic heart valve: Secondary | ICD-10-CM | POA: Diagnosis not present

## 2021-03-08 DIAGNOSIS — Z0000 Encounter for general adult medical examination without abnormal findings: Secondary | ICD-10-CM | POA: Diagnosis not present

## 2021-03-08 DIAGNOSIS — Z79899 Other long term (current) drug therapy: Secondary | ICD-10-CM | POA: Diagnosis not present

## 2021-03-22 ENCOUNTER — Ambulatory Visit
Admission: RE | Admit: 2021-03-22 | Discharge: 2021-03-22 | Disposition: A | Discharge status: Home / Self Care | Payer: Medicare HMO | Source: Ambulatory Visit | Attending: Internal Medicine | Admitting: Internal Medicine

## 2021-03-22 ENCOUNTER — Other Ambulatory Visit: Payer: Self-pay

## 2021-03-22 DIAGNOSIS — R4182 Altered mental status, unspecified: Secondary | ICD-10-CM | POA: Insufficient documentation

## 2021-03-22 DIAGNOSIS — R413 Other amnesia: Secondary | ICD-10-CM | POA: Diagnosis not present

## 2021-03-22 DIAGNOSIS — R296 Repeated falls: Secondary | ICD-10-CM | POA: Diagnosis not present

## 2021-03-22 DIAGNOSIS — R2681 Unsteadiness on feet: Secondary | ICD-10-CM | POA: Diagnosis not present

## 2021-03-30 DIAGNOSIS — Z79899 Other long term (current) drug therapy: Secondary | ICD-10-CM | POA: Diagnosis not present

## 2021-03-30 DIAGNOSIS — I482 Chronic atrial fibrillation, unspecified: Secondary | ICD-10-CM | POA: Diagnosis not present

## 2021-03-30 DIAGNOSIS — E782 Mixed hyperlipidemia: Secondary | ICD-10-CM | POA: Diagnosis not present

## 2021-03-30 DIAGNOSIS — I10 Essential (primary) hypertension: Secondary | ICD-10-CM | POA: Diagnosis not present

## 2021-03-30 DIAGNOSIS — E039 Hypothyroidism, unspecified: Secondary | ICD-10-CM | POA: Diagnosis not present

## 2021-03-30 DIAGNOSIS — E118 Type 2 diabetes mellitus with unspecified complications: Secondary | ICD-10-CM | POA: Diagnosis not present

## 2021-03-30 DIAGNOSIS — Z953 Presence of xenogenic heart valve: Secondary | ICD-10-CM | POA: Diagnosis not present

## 2021-05-12 ENCOUNTER — Emergency Department: Payer: Medicare HMO

## 2021-05-12 ENCOUNTER — Encounter: Payer: Self-pay | Admitting: Emergency Medicine

## 2021-05-12 ENCOUNTER — Inpatient Hospital Stay
Admission: EM | Admit: 2021-05-12 | Discharge: 2021-05-15 | DRG: 085 | Disposition: A | Discharge status: Home / Self Care | Payer: Medicare HMO | Attending: Hospitalist | Admitting: Hospitalist

## 2021-05-12 ENCOUNTER — Other Ambulatory Visit: Payer: Self-pay

## 2021-05-12 DIAGNOSIS — Y92009 Unspecified place in unspecified non-institutional (private) residence as the place of occurrence of the external cause: Secondary | ICD-10-CM

## 2021-05-12 DIAGNOSIS — Z720 Tobacco use: Secondary | ICD-10-CM | POA: Diagnosis not present

## 2021-05-12 DIAGNOSIS — I10 Essential (primary) hypertension: Secondary | ICD-10-CM | POA: Diagnosis present

## 2021-05-12 DIAGNOSIS — T45515A Adverse effect of anticoagulants, initial encounter: Secondary | ICD-10-CM | POA: Diagnosis present

## 2021-05-12 DIAGNOSIS — Z7901 Long term (current) use of anticoagulants: Secondary | ICD-10-CM | POA: Diagnosis not present

## 2021-05-12 DIAGNOSIS — Z043 Encounter for examination and observation following other accident: Secondary | ICD-10-CM | POA: Diagnosis not present

## 2021-05-12 DIAGNOSIS — I252 Old myocardial infarction: Secondary | ICD-10-CM

## 2021-05-12 DIAGNOSIS — R079 Chest pain, unspecified: Secondary | ICD-10-CM | POA: Diagnosis not present

## 2021-05-12 DIAGNOSIS — Z882 Allergy status to sulfonamides status: Secondary | ICD-10-CM

## 2021-05-12 DIAGNOSIS — E039 Hypothyroidism, unspecified: Secondary | ICD-10-CM | POA: Diagnosis present

## 2021-05-12 DIAGNOSIS — Z952 Presence of prosthetic heart valve: Secondary | ICD-10-CM | POA: Diagnosis not present

## 2021-05-12 DIAGNOSIS — Z888 Allergy status to other drugs, medicaments and biological substances status: Secondary | ICD-10-CM

## 2021-05-12 DIAGNOSIS — I959 Hypotension, unspecified: Secondary | ICD-10-CM | POA: Diagnosis not present

## 2021-05-12 DIAGNOSIS — D6832 Hemorrhagic disorder due to extrinsic circulating anticoagulants: Secondary | ICD-10-CM | POA: Diagnosis present

## 2021-05-12 DIAGNOSIS — U071 COVID-19: Secondary | ICD-10-CM | POA: Diagnosis present

## 2021-05-12 DIAGNOSIS — I495 Sick sinus syndrome: Secondary | ICD-10-CM | POA: Diagnosis present

## 2021-05-12 DIAGNOSIS — E038 Other specified hypothyroidism: Secondary | ICD-10-CM

## 2021-05-12 DIAGNOSIS — S066X0A Traumatic subarachnoid hemorrhage without loss of consciousness, initial encounter: Secondary | ICD-10-CM | POA: Diagnosis present

## 2021-05-12 DIAGNOSIS — I951 Orthostatic hypotension: Secondary | ICD-10-CM

## 2021-05-12 DIAGNOSIS — R531 Weakness: Secondary | ICD-10-CM | POA: Diagnosis not present

## 2021-05-12 DIAGNOSIS — R410 Disorientation, unspecified: Secondary | ICD-10-CM | POA: Diagnosis not present

## 2021-05-12 DIAGNOSIS — Z951 Presence of aortocoronary bypass graft: Secondary | ICD-10-CM

## 2021-05-12 DIAGNOSIS — D696 Thrombocytopenia, unspecified: Secondary | ICD-10-CM | POA: Diagnosis present

## 2021-05-12 DIAGNOSIS — S065XAA Traumatic subdural hemorrhage with loss of consciousness status unknown, initial encounter: Secondary | ICD-10-CM | POA: Diagnosis present

## 2021-05-12 DIAGNOSIS — Z801 Family history of malignant neoplasm of trachea, bronchus and lung: Secondary | ICD-10-CM

## 2021-05-12 DIAGNOSIS — K219 Gastro-esophageal reflux disease without esophagitis: Secondary | ICD-10-CM | POA: Diagnosis present

## 2021-05-12 DIAGNOSIS — F0390 Unspecified dementia without behavioral disturbance: Secondary | ICD-10-CM | POA: Diagnosis present

## 2021-05-12 DIAGNOSIS — Z79890 Hormone replacement therapy (postmenopausal): Secondary | ICD-10-CM

## 2021-05-12 DIAGNOSIS — Z8249 Family history of ischemic heart disease and other diseases of the circulatory system: Secondary | ICD-10-CM

## 2021-05-12 DIAGNOSIS — Z950 Presence of cardiac pacemaker: Secondary | ICD-10-CM

## 2021-05-12 DIAGNOSIS — I5022 Chronic systolic (congestive) heart failure: Secondary | ICD-10-CM | POA: Diagnosis present

## 2021-05-12 DIAGNOSIS — E119 Type 2 diabetes mellitus without complications: Secondary | ICD-10-CM | POA: Diagnosis present

## 2021-05-12 DIAGNOSIS — W19XXXA Unspecified fall, initial encounter: Secondary | ICD-10-CM | POA: Diagnosis present

## 2021-05-12 DIAGNOSIS — M1990 Unspecified osteoarthritis, unspecified site: Secondary | ICD-10-CM | POA: Diagnosis present

## 2021-05-12 DIAGNOSIS — I482 Chronic atrial fibrillation, unspecified: Secondary | ICD-10-CM | POA: Diagnosis present

## 2021-05-12 DIAGNOSIS — I6201 Nontraumatic acute subdural hemorrhage: Secondary | ICD-10-CM | POA: Diagnosis not present

## 2021-05-12 DIAGNOSIS — D509 Iron deficiency anemia, unspecified: Secondary | ICD-10-CM | POA: Diagnosis present

## 2021-05-12 DIAGNOSIS — G9389 Other specified disorders of brain: Secondary | ICD-10-CM | POA: Diagnosis present

## 2021-05-12 DIAGNOSIS — N400 Enlarged prostate without lower urinary tract symptoms: Secondary | ICD-10-CM | POA: Diagnosis present

## 2021-05-12 DIAGNOSIS — S065X0A Traumatic subdural hemorrhage without loss of consciousness, initial encounter: Secondary | ICD-10-CM | POA: Diagnosis present

## 2021-05-12 DIAGNOSIS — I219 Acute myocardial infarction, unspecified: Secondary | ICD-10-CM | POA: Diagnosis present

## 2021-05-12 DIAGNOSIS — I2510 Atherosclerotic heart disease of native coronary artery without angina pectoris: Secondary | ICD-10-CM | POA: Diagnosis present

## 2021-05-12 DIAGNOSIS — Z91048 Other nonmedicinal substance allergy status: Secondary | ICD-10-CM

## 2021-05-12 DIAGNOSIS — D638 Anemia in other chronic diseases classified elsewhere: Secondary | ICD-10-CM | POA: Diagnosis present

## 2021-05-12 DIAGNOSIS — I714 Abdominal aortic aneurysm, without rupture, unspecified: Secondary | ICD-10-CM | POA: Diagnosis present

## 2021-05-12 DIAGNOSIS — Z841 Family history of disorders of kidney and ureter: Secondary | ICD-10-CM

## 2021-05-12 DIAGNOSIS — R55 Syncope and collapse: Secondary | ICD-10-CM | POA: Diagnosis not present

## 2021-05-12 DIAGNOSIS — M47812 Spondylosis without myelopathy or radiculopathy, cervical region: Secondary | ICD-10-CM | POA: Diagnosis not present

## 2021-05-12 DIAGNOSIS — R000 Tachycardia, unspecified: Secondary | ICD-10-CM | POA: Diagnosis not present

## 2021-05-12 DIAGNOSIS — I4891 Unspecified atrial fibrillation: Secondary | ICD-10-CM | POA: Diagnosis present

## 2021-05-12 DIAGNOSIS — I110 Hypertensive heart disease with heart failure: Secondary | ICD-10-CM | POA: Diagnosis present

## 2021-05-12 DIAGNOSIS — R42 Dizziness and giddiness: Secondary | ICD-10-CM | POA: Diagnosis not present

## 2021-05-12 DIAGNOSIS — I615 Nontraumatic intracerebral hemorrhage, intraventricular: Secondary | ICD-10-CM | POA: Diagnosis not present

## 2021-05-12 DIAGNOSIS — Z7984 Long term (current) use of oral hypoglycemic drugs: Secondary | ICD-10-CM

## 2021-05-12 DIAGNOSIS — Z79899 Other long term (current) drug therapy: Secondary | ICD-10-CM

## 2021-05-12 LAB — BASIC METABOLIC PANEL
Anion gap: 12 (ref 5–15)
BUN: 33 mg/dL — ABNORMAL HIGH (ref 8–23)
CO2: 24 mmol/L (ref 22–32)
Calcium: 8.5 mg/dL — ABNORMAL LOW (ref 8.9–10.3)
Chloride: 104 mmol/L (ref 98–111)
Creatinine, Ser: 1.22 mg/dL (ref 0.61–1.24)
GFR, Estimated: 58 mL/min — ABNORMAL LOW (ref >60)
Glucose, Bld: 154 mg/dL — ABNORMAL HIGH (ref 70–99)
Potassium: 4.1 mmol/L (ref 3.5–5.1)
Sodium: 140 mmol/L (ref 135–145)

## 2021-05-12 LAB — RESP PANEL BY RT-PCR (FLU A&B, COVID) ARPGX2
Influenza A by PCR: NEGATIVE
Influenza B by PCR: NEGATIVE
SARS Coronavirus 2 by RT PCR: POSITIVE — AB

## 2021-05-12 LAB — HEPATIC FUNCTION PANEL
ALT: 10 U/L (ref 0–44)
AST: 25 U/L (ref 15–41)
Albumin: 3.1 g/dL — ABNORMAL LOW (ref 3.5–5.0)
Alkaline Phosphatase: 49 U/L (ref 38–126)
Bilirubin, Direct: 0.6 mg/dL — ABNORMAL HIGH (ref 0.0–0.2)
Indirect Bilirubin: 1 mg/dL — ABNORMAL HIGH (ref 0.3–0.9)
Total Bilirubin: 1.6 mg/dL — ABNORMAL HIGH (ref 0.3–1.2)
Total Protein: 7.9 g/dL (ref 6.5–8.1)

## 2021-05-12 LAB — URINALYSIS, ROUTINE W REFLEX MICROSCOPIC
Bilirubin Urine: NEGATIVE
Glucose, UA: NEGATIVE mg/dL
Hgb urine dipstick: NEGATIVE
Ketones, ur: 5 mg/dL — AB
Leukocytes,Ua: NEGATIVE
Nitrite: NEGATIVE
Protein, ur: NEGATIVE mg/dL
Specific Gravity, Urine: 1.019 (ref 1.005–1.030)
pH: 5 (ref 5.0–8.0)

## 2021-05-12 LAB — PROTIME-INR
INR: 3.9 — ABNORMAL HIGH (ref 0.8–1.2)
INR: 1.4 — ABNORMAL HIGH (ref 0.8–1.2)
Prothrombin Time: 37.9 seconds — ABNORMAL HIGH (ref 11.4–15.2)
Prothrombin Time: 17.2 seconds — ABNORMAL HIGH (ref 11.4–15.2)

## 2021-05-12 LAB — CBC
HCT: 34.1 % — ABNORMAL LOW (ref 39.0–52.0)
HCT: 40.4 % (ref 39.0–52.0)
Hemoglobin: 11.2 g/dL — ABNORMAL LOW (ref 13.0–17.0)
Hemoglobin: 13.1 g/dL (ref 13.0–17.0)
MCH: 32.7 pg (ref 26.0–34.0)
MCH: 32.7 pg (ref 26.0–34.0)
MCHC: 32.4 g/dL (ref 30.0–36.0)
MCHC: 32.8 g/dL (ref 30.0–36.0)
MCV: 100.7 fL — ABNORMAL HIGH (ref 80.0–100.0)
MCV: 99.7 fL (ref 80.0–100.0)
Platelets: 60 10*3/uL — ABNORMAL LOW (ref 150–400)
Platelets: 67 10*3/uL — ABNORMAL LOW (ref 150–400)
RBC: 3.42 MIL/uL — ABNORMAL LOW (ref 4.22–5.81)
RBC: 4.01 MIL/uL — ABNORMAL LOW (ref 4.22–5.81)
RDW: 12.9 % (ref 11.5–15.5)
RDW: 12.9 % (ref 11.5–15.5)
WBC: 3.1 10*3/uL — ABNORMAL LOW (ref 4.0–10.5)
WBC: 5 10*3/uL (ref 4.0–10.5)
nRBC: 0 % (ref 0.0–0.2)
nRBC: 0 % (ref 0.0–0.2)

## 2021-05-12 LAB — APTT: aPTT: 58 seconds — ABNORMAL HIGH (ref 24–36)

## 2021-05-12 LAB — TYPE AND SCREEN
ABO/RH(D): A POS
Antibody Screen: NEGATIVE

## 2021-05-12 LAB — LACTIC ACID, PLASMA
Lactic Acid, Venous: 3.1 mmol/L (ref 0.5–1.9)
Lactic Acid, Venous: 1.5 mmol/L (ref 0.5–1.9)

## 2021-05-12 MED ORDER — SODIUM CHLORIDE 0.9 % IV SOLN
10.0000 mL/h | Freq: Once | INTRAVENOUS | Status: AC
  Administered 2021-05-12: 10 mL/h via INTRAVENOUS

## 2021-05-12 MED ORDER — PROTHROMBIN COMPLEX CONC HUMAN 500 UNITS IV KIT
1587.0000 [IU] | PACK | Status: AC
  Administered 2021-05-12: 1587 [IU] via INTRAVENOUS
  Filled 2021-05-12: qty 1587

## 2021-05-12 MED ORDER — VITAMIN K1 10 MG/ML IJ SOLN
10.0000 mg | INTRAVENOUS | Status: AC
  Administered 2021-05-12: 10 mg via INTRAVENOUS
  Filled 2021-05-12: qty 1

## 2021-05-12 MED ORDER — SODIUM CHLORIDE 0.9 % IV BOLUS
1000.0000 mL | Freq: Once | INTRAVENOUS | Status: AC
  Administered 2021-05-12: 1000 mL via INTRAVENOUS

## 2021-05-12 NOTE — ED Notes (Signed)
Pt given 2 warm blankets

## 2021-05-12 NOTE — ED Notes (Signed)
Patient transported to CT 

## 2021-05-12 NOTE — ED Triage Notes (Signed)
Pt to ED via [facility] from home for unwitnessed fall. Pt did hit his head, pt is on blood thinners. Pt wife told EMS that pt has been more confused over the last few weeks. Pt has had increased falls at home. EMS reports pt is A & O x 3. VSS. Pt is in NAD.  ? ? ? ?

## 2021-05-12 NOTE — ED Triage Notes (Signed)
See First Nurse Note. ?Patient to ED via [facility] from home for a unwitnessed fall. Denies pain at this time. Increased confusion noted and generalized weakness. Patient oriented to self and situation at this time. ?

## 2021-05-12 NOTE — H&P (Signed)
?History and Physical  ? ? ?Patient: Derrick Cochran:[id number] DOB: 06-Apr-1935 ?DOA: 05/12/2021 ?DOS: the patient was seen and examined on 05/13/2021 ?PCP: Derrick Crouch, MD  ?Patient coming from: Home ? ?Chief Complaint:  ?Chief Complaint  ?Patient presents with  ? Fall  ? ?HPI: Derrick Cochran is a 86 y.o. male with medical history significant of CAD,CHF, pacemaker , HTN, hypothyroidism, gerd, a.fibrillation, AAA,+sing with c/o fall. ?Pt fell at home and family wife and son at home rushed to him after hearing a loud thump. ?At baseline pt is said to be oriented with some memory issue and dementia.  ?In ed pt is found to have subdural hematoma, right frontal subarachnoid  extending into ventricle , ML degen changes and severe neural foraminal stenosis, and supra therapeutic INR due to coumadin/ for a.fib, and neurosurgery consulted and with stat kaycentra and vit k. Labs show thrombocytopenia and normal hemoglobin whoever per chart review pt seems to be anemic over past few years which I suspect is from combination of ACD and IDA/ MCV is high but pt has hypothyroidism and we will obtain anemia panel, iv ppi and fecal occult.  ? ? ?Review of Systems: Review of Systems  ?Unable to perform ROS: Other (confusion/ dementia.)  ? ?Past Medical History:  ?Diagnosis Date  ? AAA (abdominal aortic aneurysm)   ? Arrhythmia   ? atrial fibrillation  ? Arthritis   ? BPH (benign prostatic hyperplasia)   ? Cancer [facility])   ? skin  ? CHF (congestive heart failure) ([place])   ? Coronary artery disease   ? DDD (degenerative disc disease), thoracic   ? Diabetes mellitus without complication ([place])   ? Esophageal stricture   ? GERD (gastroesophageal reflux disease)   ? Heart murmur   ? Hyperlipidemia   ? Hypertension   ? Hypothyroidism   ? Myocardial infarction [facility])   ? Presence of permanent cardiac pacemaker   ? Shortness of breath dyspnea   ? ?Past Surgical History:  ?Procedure Laterality Date  ? AORTIC VALVE REPLACEMENT  1995  ?  Bioprosthetic - [facility]  ? APPENDECTOMY    ? CARDIAC CATHETERIZATION  1994 x 2 with PCTA of RCA  ? CARDIAC VALVE REPLACEMENT    ? Bovine transcatheter heart valve  ? COLONOSCOPY  2015  ? CORONARY ARTERY BYPASS GRAFT  1995  ? x 4 Vessels - [facility]  ? ELECTROPHYSIOLOGIC STUDY N/A 08/06/2014  ? Procedure: Cardioversion;  Surgeon: [name], MD;  Location: [facility];  Service: Cardiovascular;  Laterality: N/A;  ? ELECTROPHYSIOLOGIC STUDY N/A 06/18/2014  ? Procedure: CARDIOVERSION;  Surgeon: [name], MD;  Location: [facility];  Service: Cardiovascular;  Laterality: N/A;  ? ESOPHAGOGASTRODUODENOSCOPY  2014  ? EYE SURGERY    ? GREAT TOE ARTHRODESIS, INTERPHALANGEAL JOINT Left   ? HERNIA REPAIR Left   ? Inguinal  ? IR RADIOLOGIST EVAL & MGMT  11/04/2019  ? PACEMAKER INSERTION N/A 07/02/2014  ? Procedure: INSERTION PACEMAKER/DUAL CHAMBER  INITIAL IMPLANT;  Surgeon: [name], MD;  Location: [facility] ORS;  Service: Cardiovascular;  Laterality: N/A;  ? SKIN BIOPSY    ? ?Social History:  reports that he quit smoking about 36 years ago. His smoking use included cigarettes. He has a 7.50 pack-year smoking history. He has quit using smokeless tobacco.  His smokeless tobacco use included chew. He reports that he does not drink alcohol and does not use drugs. ? ?Allergies  ?Allergen Reactions  ? Adhesive [Tape] Rash  ?  Ok with Tegaderm   ? Cefdinir Other (See Comments)  ?  nightmares  ? Sulfa Antibiotics Rash  ? Sulfamethoxazole Hives, Itching and Rash  ? ? ?Family History  ?Problem Relation Age of Onset  ? Cancer Mother   ? Lung cancer Father   ? Kidney disease Sister   ? Heart failure Brother   ? ? ?Prior to Admission medications   ?Medication Sig Start Date End Date Taking? Authorizing Provider  ?acetaminophen (TYLENOL) 500 MG tablet Take 500 mg by mouth every 6 (six) hours as needed for mild pain, moderate pain or fever.     [provider]  ?enalapril (VASOTEC) 5 MG tablet Take 1 tablet (5 mg  total) by mouth daily. 11/30/15   Bettey Costa, MD  ?furosemide (LASIX) 20 MG tablet Take 1 tablet (20 mg total) by mouth 2 (two) times daily. ?Patient taking differently: Take 20 mg by mouth daily. Additional 20 mg if needed 09/25/17   Dustin Flock, MD  ?gabapentin (NEURONTIN) 300 MG capsule Take 300 mg by mouth at bedtime.    [provider]  ?glipiZIDE (GLUCOTROL XL) 5 MG 24 hr tablet Take 5 mg by mouth daily with breakfast.  01/21/15   [provider]  ?HYDROcodone-acetaminophen (NORCO) 5-325 MG tablet Take 1 tablet by mouth every 6 (six) hours as needed for up to 6 doses for moderate pain. 11/21/19   Benjamine Sprague, DO  ?levocetirizine (XYZAL) 5 MG tablet Take 5 mg by mouth every evening. 09/03/19   [provider]  ?levothyroxine (SYNTHROID) 150 MCG tablet Take 150 mcg by mouth daily before breakfast.     [provider]  ?metFORMIN (GLUCOPHAGE) 500 MG tablet Take 500 mg by mouth 2 (two) times daily.     [provider]  ?metoprolol succinate (TOPROL-XL) 50 MG 24 hr tablet Take 50 mg by mouth daily.     [provider]  ?nitroGLYCERIN (NITROSTAT) 0.4 MG SL tablet Place 0.4 mg under the tongue every 5 (five) minutes as needed for chest pain.    [provider]  ?omeprazole (PRILOSEC) 20 MG capsule Take 20 mg by mouth daily.     [provider]  ?simvastatin (ZOCOR) 40 MG tablet Take 40 mg by mouth every evening.     [provider]  ?tamsulosin (FLOMAX) 0.4 MG CAPS capsule Take 0.4 mg by mouth every morning.     [provider]  ?temazepam (RESTORIL) 30 MG capsule Take 30-60 mg by mouth at bedtime as needed for sleep.     [provider]  ? ?Physical Exam: ?Vitals:  ? 05/13/21 0030 05/13/21 0045 05/13/21 0100 05/13/21 0115  ?BP: (!) 131/53 132/65 (!) 134/59 (!) 136/57  ?Pulse: 70 64 64   ?Resp: 20 (!) 22 (!) 23 (!) 26  ?Temp:      ?TempSrc:      ?SpO2: 97% 100% 98%   ?Weight:      ?Height:      ? ?Physical  Exam ?Vitals and nursing note reviewed.  ?Constitutional:   ?   General: He is not in acute distress. ?   Appearance: Normal appearance. He is not ill-appearing, toxic-appearing or diaphoretic.  ?HENT:  ?   Head: Normocephalic and atraumatic.  ?   Right Ear: Hearing and external ear normal.  ?   Left Ear: Hearing and external ear normal.  ?   Nose: Nose normal. No nasal deformity.  ?   Mouth/Throat:  ?   Lips: Pink.  ?  Mouth: Mucous membranes are moist.  ?   Tongue: No lesions.  ?   Pharynx: Oropharynx is clear.  ?Eyes:  ?   Extraocular Movements: Extraocular movements intact.  ?   Pupils: Pupils are equal, round, and reactive to light.  ?Neck:  ?   Vascular: No carotid bruit.  ?Cardiovascular:  ?   Rate and Rhythm: Normal rate and regular rhythm.  ?   Pulses: Normal pulses.  ?   Heart sounds: Normal heart sounds.  ?Pulmonary:  ?   Effort: Pulmonary effort is normal.  ?   Breath sounds: Normal breath sounds.  ?Abdominal:  ?   General: Bowel sounds are normal. There is no distension.  ?   Palpations: Abdomen is soft. There is no mass.  ?   Tenderness: There is no abdominal tenderness. There is no guarding.  ?   Hernia: No hernia is present.  ?Musculoskeletal:  ?   Right lower leg: No edema.  ?   Left lower leg: No edema.  ?Skin: ?   General: Skin is warm.  ?Neurological:  ?   General: No focal deficit present.  ?   Mental Status: He is alert. He is disoriented.  ?   Cranial Nerves: Cranial nerves 2-12 are intact. No cranial nerve deficit.  ?   Motor: Motor function is intact. No weakness.  ?   Coordination: Coordination normal.  ?   Deep Tendon Reflexes: Reflexes normal.  ?Psychiatric:     ?   Attention and Perception: Attention normal.     ?   Mood and Affect: Mood normal.     ?   Speech: Speech normal.     ?   Behavior: Behavior normal. Behavior is cooperative.     ?   Cognition and Memory: Cognition normal.  ? ? ?Data Reviewed: ?Results for orders placed or performed during the hospital encounter of 05/12/21  (from the past 24 hour(s))  ?Basic metabolic panel     Status: Abnormal  ? Collection Time: 05/12/21  4:12 PM  ?Result Value Ref Range  ? Sodium 140 135 - 145 mmol/L  ? Potassium 4.1 3.5 - 5.1 mmol/L  ? Chloride 104

## 2021-05-12 NOTE — ED Notes (Signed)
Blue, lavender, and light green tubes sent to lab at this time. ?

## 2021-05-12 NOTE — ED Notes (Signed)
Pt did not remember what happened today before he fell, pt stated he does not remember being dizzy and does not remember hitting his head or why he has blood in his mouth. No lacerations noted in or on his mouth at this time ?

## 2021-05-12 NOTE — ED Provider Notes (Signed)
? ?[facility] ?Provider Note ? ? ? Event Date/Time  ? First MD Initiated Contact with Patient 05/12/21 1619   ?  (approximate) ? ? ?History  ? ? ? ?HPI ? ?Derrick Cochran is a 86 y.o. male  with pmh CAD s/p MI, GERD, HTN, HLD, atrial fibrillation on Coumadin who presents after a fall.  Patient not really know why he is here does not recall any fall.  In talking with his son and wife he was in the other room today when they heard a very loud bang that "shook the house" they found him lying on his back.  Patient has been somewhat sick recently with cold symptoms.  Son notes that he is typically oriented x3 but does have some confusion maybe has some mild dementia.  His wife helps take care of him.  Patient denies any headache chest pain or pain currently.  Does endorse feeling somewhat off balance lately feels like he is going to fall when he is walking.  Endorses having a cold but denies any shortness of breath. ? ?  ? ?Past Medical History:  ?Diagnosis Date  ?? AAA (abdominal aortic aneurysm)   ?? Arrhythmia   ? atrial fibrillation  ?? Arthritis   ?? BPH (benign prostatic hyperplasia)   ?? Cancer [facility])   ? skin  ?? CHF (congestive heart failure) ([place])   ?? Coronary artery disease   ?? DDD (degenerative disc disease), thoracic   ?? Diabetes mellitus without complication ([place])   ?? Esophageal stricture   ?? GERD (gastroesophageal reflux disease)   ?? Heart murmur   ?? Hyperlipidemia   ?? Hypertension   ?? Hypothyroidism   ?? Myocardial infarction [facility])   ?? Presence of permanent cardiac pacemaker   ?? Shortness of breath dyspnea   ? ? ?Patient Active Problem List  ? Diagnosis Date Noted  ?? Chronic cholecystitis 11/21/2019  ?? Acute cholecystitis 10/01/2019  ?? CHF (congestive heart failure) ([place]) 09/23/2017  ?? AAA (abdominal aortic aneurysm) without rupture 02/15/2016  ?? Palpitations 11/28/2015  ?? Dyspnea 11/28/2015  ?? Chronic systolic heart failure ([place]) 04/23/2015  ?? HTN (hypertension)  04/23/2015  ?? Diabetes ([address]) 04/23/2015  ?? Sick sinus syndrome ([place]) 07/03/2014  ?? Atrial fibrillation ([place]) 06/17/2014  ? ? ? ?Physical Exam  ?Triage Vital Signs: ?ED Triage Vitals  ?Enc Vitals Group  ?   BP 05/12/21 1602 110/60  ?   Pulse Rate 05/12/21 1602 74  ?   Resp 05/12/21 1602 18  ?   Temp 05/12/21 1602 (S) (!) 96.5 ?F (35.8 ?C)  ?   Temp Source 05/12/21 1602 Axillary  ?   SpO2 05/12/21 1602 94 %  ?   Weight 05/12/21 1607 175 lb (79.4 kg)  ?   Height 05/12/21 1607 '5\' 10"'$  (1.778 m)  ?   Head Circumference --   ?   [place] Flow --   ?   Pain Score 05/12/21 1607 0  ?   Pain Loc --   ?   Pain Edu? --   ?   Excl. in [place]? --   ? ? ?Most recent vital signs: ?Vitals:  ? 05/12/21 1900 05/12/21 1915  ?BP: 129/69 129/69  ?Pulse: 87 67  ?Resp:  18  ?Temp:    ?SpO2: 97% 100%  ? ? ? ?General: Awake, no distress.  ?CV:  Good peripheral perfusion.  ?Resp:  Normal effort.  ?Abd:  No distention.  Soft nontender throughout ?Neuro:  Awake, Alert, Oriented x 2-thinks it is 1984 ?Other:  Aox3, nml speech  ?PERRL, EOMI, face symmetric, nml tongue movement  ?5/5 strength in the BL upper and lower extremities  ?Sensation grossly intact in the BL upper and lower extremities  ?Finger-nose-finger intact BL ?Bruising that appears old over the right lower extremity from knee down, no focal bony tenderness or deformity ? ? ? ?ED Results / Procedures / Treatments  ?Labs ?(all labs ordered are listed, but only abnormal results are displayed) ?Labs Reviewed  ?RESP PANEL BY RT-PCR (FLU A&B, COVID) ARPGX2 - Abnormal; Notable for the following components:  ?    Result Value  ? SARS Coronavirus 2 by RT PCR POSITIVE (*)   ? All other components within normal limits  ?BASIC METABOLIC PANEL - Abnormal; Notable for the following components:  ? Glucose, Bld 154 (*)   ? BUN 33 (*)   ? Calcium 8.5 (*)   ? GFR, Estimated 58 (*)   ? All other components within normal limits  ?CBC - Abnormal; Notable for the following components:  ? RBC 4.01  (*)   ? MCV 100.7 (*)   ? Platelets 67 (*)   ? All other components within normal limits  ?URINALYSIS, ROUTINE W REFLEX MICROSCOPIC - Abnormal; Notable for the following components:  ? Color, Urine YELLOW (*)   ? APPearance HAZY (*)   ? Ketones, ur 5 (*)   ? All other components within normal limits  ?PROTIME-INR - Abnormal; Notable for the following components:  ? Prothrombin Time 37.9 (*)   ? INR 3.9 (*)   ? All other components within normal limits  ?APTT - Abnormal; Notable for the following components:  ? aPTT 58 (*)   ? All other components within normal limits  ?LACTIC ACID, PLASMA - Abnormal; Notable for the following components:  ? Lactic Acid, Venous 3.1 (*)   ? All other components within normal limits  ?HEPATIC FUNCTION PANEL - Abnormal; Notable for the following components:  ? Albumin 3.1 (*)   ? Total Bilirubin 1.6 (*)   ? Bilirubin, Direct 0.6 (*)   ? Indirect Bilirubin 1.0 (*)   ? All other components within normal limits  ?CULTURE, BLOOD (ROUTINE X 2)  ?CULTURE, BLOOD (ROUTINE X 2)  ?LACTIC ACID, PLASMA  ?PROTIME-INR  ?PROTIME-INR  ?CBG MONITORING, ED  ? ? ? ?EKG ? ?Paced rhythm with left bundle branch block appearance, no Sgarbossa criteria for ischemia ? ? ?RADIOLOGY ?Reviewed the CT head which shows a parafalcine subdural hematoma ? ? ?PROCEDURES: ? ?Critical Care performed: Yes, see critical care procedure note(s) ? ?.1-3 Lead EKG Interpretation ?Performed by: Rada Hay, MD ?Authorized by: Rada Hay, MD  ? ?  Interpretation: abnormal   ?  ECG rate assessment: normal   ?  Rhythm: paced   ?  Ectopy: none   ?  Conduction: normal   ?.Critical Care ?Performed by: Rada Hay, MD ?Authorized by: Rada Hay, MD  ? ?Critical care provider statement:  ?  Critical care time (minutes):  30 ?  Critical care was time spent personally by me on the following activities:  Development of treatment plan with patient or surrogate, discussions with consultants, evaluation of  patient's response to treatment, examination of patient, ordering and review of laboratory studies, ordering and review of radiographic studies, ordering and performing treatments and interventions, pulse oximetry, re-evaluation of patient's condition and review of old charts ? ?The patient is on the cardiac monitor  to evaluate for evidence of arrhythmia and/or significant heart rate changes. ? ? ?MEDICATIONS ORDERED IN ED: ?Medications  ?phytonadione (VITAMIN K) 10 mg in dextrose 5 % 50 mL IVPB (10 mg Intravenous New Bag/Given 05/12/21 1920)  ?prothrombin complex conc human (KCENTRA) IVPB 1,587 Units (0 Units Intravenous Stopped 05/12/21 1921)  ?sodium chloride 0.9 % bolus 1,000 mL (1,000 mLs Intravenous Bolus 05/12/21 1853)  ? ? ? ?IMPRESSION / MDM / ASSESSMENT AND PLAN / ED COURSE  ?I reviewed the triage vital signs and the nursing notes. ?             ?               ? ?Patient is an 86 year old male who presents primarily for a fall today.  He was at home when his family heard a loud bang and found him lying on the floor on his back.  CT head and C-spine were obtained from triage and he has a 7 mm parafalcine subdural hematoma as well as a small subarachnoid.  Patient is on Coumadin.  On exam he is alert and oriented x2 thinks it is 29 which is apparently not his baseline but otherwise he is nonfocal has no complaints does not recall any of the events from today however says that he is somewhat confused.  Discussed with Dr. Cari Caraway recommends reversing his Coumadin and obtaining a repeat CT head in 6 hours from his standpoint the patient will be able to be discharged however because of his altered mental status I do think he requires admission.  Initial temperature was recorded as hypothermic, obtained labs including lactate CBC BMP UA and chest x-ray to screen for infectious causes of his altered mental status.  Lactate is elevated at 3.1 however UA is negative chest x-ray does not show any focal infiltrate  and patient has no other symptoms of infection although did have cold-like symptoms last week.  Patient was given vitamin K and Kcentra to reverse his anticoagulation.  We will obtain a repeat CT head and 6 hou

## 2021-05-13 ENCOUNTER — Encounter: Payer: Self-pay | Admitting: Internal Medicine

## 2021-05-13 DIAGNOSIS — S065XAA Traumatic subdural hemorrhage with loss of consciousness status unknown, initial encounter: Secondary | ICD-10-CM | POA: Diagnosis present

## 2021-05-13 DIAGNOSIS — Z952 Presence of prosthetic heart valve: Secondary | ICD-10-CM | POA: Diagnosis not present

## 2021-05-13 DIAGNOSIS — W19XXXA Unspecified fall, initial encounter: Secondary | ICD-10-CM | POA: Diagnosis present

## 2021-05-13 DIAGNOSIS — E119 Type 2 diabetes mellitus without complications: Secondary | ICD-10-CM | POA: Diagnosis present

## 2021-05-13 DIAGNOSIS — Z7901 Long term (current) use of anticoagulants: Secondary | ICD-10-CM | POA: Diagnosis not present

## 2021-05-13 DIAGNOSIS — Y92009 Unspecified place in unspecified non-institutional (private) residence as the place of occurrence of the external cause: Secondary | ICD-10-CM

## 2021-05-13 DIAGNOSIS — D509 Iron deficiency anemia, unspecified: Secondary | ICD-10-CM | POA: Diagnosis present

## 2021-05-13 DIAGNOSIS — I4891 Unspecified atrial fibrillation: Secondary | ICD-10-CM | POA: Diagnosis not present

## 2021-05-13 DIAGNOSIS — N400 Enlarged prostate without lower urinary tract symptoms: Secondary | ICD-10-CM | POA: Diagnosis present

## 2021-05-13 DIAGNOSIS — T45515A Adverse effect of anticoagulants, initial encounter: Secondary | ICD-10-CM | POA: Diagnosis present

## 2021-05-13 DIAGNOSIS — Z801 Family history of malignant neoplasm of trachea, bronchus and lung: Secondary | ICD-10-CM | POA: Diagnosis not present

## 2021-05-13 DIAGNOSIS — D638 Anemia in other chronic diseases classified elsewhere: Secondary | ICD-10-CM | POA: Diagnosis present

## 2021-05-13 DIAGNOSIS — I714 Abdominal aortic aneurysm, without rupture, unspecified: Secondary | ICD-10-CM | POA: Diagnosis present

## 2021-05-13 DIAGNOSIS — I495 Sick sinus syndrome: Secondary | ICD-10-CM | POA: Diagnosis present

## 2021-05-13 DIAGNOSIS — E039 Hypothyroidism, unspecified: Secondary | ICD-10-CM | POA: Diagnosis present

## 2021-05-13 DIAGNOSIS — S065X0A Traumatic subdural hemorrhage without loss of consciousness, initial encounter: Secondary | ICD-10-CM | POA: Diagnosis present

## 2021-05-13 DIAGNOSIS — D696 Thrombocytopenia, unspecified: Secondary | ICD-10-CM | POA: Diagnosis present

## 2021-05-13 DIAGNOSIS — I110 Hypertensive heart disease with heart failure: Secondary | ICD-10-CM | POA: Diagnosis present

## 2021-05-13 DIAGNOSIS — I5022 Chronic systolic (congestive) heart failure: Secondary | ICD-10-CM | POA: Diagnosis present

## 2021-05-13 DIAGNOSIS — I10 Essential (primary) hypertension: Secondary | ICD-10-CM | POA: Diagnosis not present

## 2021-05-13 DIAGNOSIS — Z720 Tobacco use: Secondary | ICD-10-CM | POA: Diagnosis not present

## 2021-05-13 DIAGNOSIS — K219 Gastro-esophageal reflux disease without esophagitis: Secondary | ICD-10-CM | POA: Diagnosis present

## 2021-05-13 DIAGNOSIS — I252 Old myocardial infarction: Secondary | ICD-10-CM | POA: Diagnosis not present

## 2021-05-13 DIAGNOSIS — I482 Chronic atrial fibrillation, unspecified: Secondary | ICD-10-CM | POA: Diagnosis present

## 2021-05-13 DIAGNOSIS — I2510 Atherosclerotic heart disease of native coronary artery without angina pectoris: Secondary | ICD-10-CM | POA: Diagnosis present

## 2021-05-13 DIAGNOSIS — U071 COVID-19: Secondary | ICD-10-CM | POA: Diagnosis present

## 2021-05-13 DIAGNOSIS — D6832 Hemorrhagic disorder due to extrinsic circulating anticoagulants: Secondary | ICD-10-CM | POA: Diagnosis present

## 2021-05-13 DIAGNOSIS — G9389 Other specified disorders of brain: Secondary | ICD-10-CM | POA: Diagnosis present

## 2021-05-13 DIAGNOSIS — F0390 Unspecified dementia without behavioral disturbance: Secondary | ICD-10-CM | POA: Diagnosis present

## 2021-05-13 LAB — HEMOGLOBIN A1C
Hgb A1c MFr Bld: 6.8 % — ABNORMAL HIGH (ref 4.8–5.6)
Mean Plasma Glucose: 148.46 mg/dL

## 2021-05-13 LAB — GLUCOSE, CAPILLARY: Glucose-Capillary: 139 mg/dL — ABNORMAL HIGH (ref 70–99)

## 2021-05-13 LAB — PROTIME-INR
INR: 1.2 (ref 0.8–1.2)
Prothrombin Time: 15.5 seconds — ABNORMAL HIGH (ref 11.4–15.2)

## 2021-05-13 LAB — PREPARE PLATELET PHERESIS: Unit division: 0

## 2021-05-13 LAB — BPAM PLATELET PHERESIS
Blood Product Expiration Date: 202304012359
ISSUE DATE / TIME: 202303302344
Unit Type and Rh: 6200

## 2021-05-13 LAB — CBG MONITORING, ED
Glucose-Capillary: 109 mg/dL — ABNORMAL HIGH (ref 70–99)
Glucose-Capillary: 97 mg/dL (ref 70–99)
Glucose-Capillary: 228 mg/dL — ABNORMAL HIGH (ref 70–99)

## 2021-05-13 MED ORDER — ACETAMINOPHEN 650 MG RE SUPP: 650.0000 mg | RECTAL | Status: DC | PRN

## 2021-05-13 MED ORDER — SOTALOL HCL 80 MG PO TABS
120.0000 mg | ORAL_TABLET | Freq: Two times a day (BID) | ORAL | Status: DC
Start: 2021-05-13 — End: 2021-05-13

## 2021-05-13 MED ORDER — STROKE: EARLY STAGES OF RECOVERY BOOK: Freq: Once | Status: DC

## 2021-05-13 MED ORDER — METOPROLOL SUCCINATE ER 50 MG PO TB24
50.0000 mg | ORAL_TABLET | Freq: Every day | ORAL | Status: DC
  Administered 2021-05-14 – 2021-05-15 (×2): 50 mg via ORAL
  Filled 2021-05-13 (×3): qty 1

## 2021-05-13 MED ORDER — GABAPENTIN 300 MG PO CAPS
300.0000 mg | ORAL_CAPSULE | Freq: Every day | ORAL | Status: DC
  Administered 2021-05-13 – 2021-05-14 (×3): 300 mg via ORAL
  Filled 2021-05-13 (×3): qty 1

## 2021-05-13 MED ORDER — TAMSULOSIN HCL 0.4 MG PO CAPS
0.4000 mg | ORAL_CAPSULE | Freq: Every morning | ORAL | Status: DC
Start: 2021-05-14 — End: 2021-05-15
  Administered 2021-05-14 – 2021-05-15 (×2): 0.4 mg via ORAL
  Filled 2021-05-13 (×2): qty 1

## 2021-05-13 MED ORDER — INSULIN ASPART 100 UNIT/ML IJ SOLN
0.0000 [IU] | Freq: Three times a day (TID) | INTRAMUSCULAR | Status: DC
  Administered 2021-05-13: 3 [IU] via SUBCUTANEOUS
  Administered 2021-05-13: 1 [IU] via SUBCUTANEOUS
  Filled 2021-05-13 (×2): qty 1

## 2021-05-13 MED ORDER — LEVOTHYROXINE SODIUM 50 MCG PO TABS
150.0000 ug | ORAL_TABLET | Freq: Every day | ORAL | Status: DC
  Administered 2021-05-13 – 2021-05-15 (×3): 150 ug via ORAL
  Filled 2021-05-13 (×2): qty 1
  Filled 2021-05-13: qty 3

## 2021-05-13 MED ORDER — HYDROCODONE-ACETAMINOPHEN 5-325 MG PO TABS
1.0000 | ORAL_TABLET | Freq: Four times a day (QID) | ORAL | Status: DC | PRN
  Administered 2021-05-14: 1 via ORAL
  Filled 2021-05-13: qty 1

## 2021-05-13 MED ORDER — PANTOPRAZOLE SODIUM 40 MG PO TBEC
40.0000 mg | DELAYED_RELEASE_TABLET | Freq: Every day | ORAL | Status: DC
Start: 2021-05-13 — End: 2021-05-15
  Administered 2021-05-13 – 2021-05-15 (×3): 40 mg via ORAL
  Filled 2021-05-13 (×3): qty 1

## 2021-05-13 MED ORDER — ACETAMINOPHEN 160 MG/5ML PO SOLN
650.0000 mg | ORAL | Status: DC | PRN
  Filled 2021-05-13: qty 20.3

## 2021-05-13 MED ORDER — ENALAPRIL MALEATE 5 MG PO TABS
5.0000 mg | ORAL_TABLET | Freq: Every day | ORAL | Status: DC
  Administered 2021-05-14: 5 mg via ORAL
  Filled 2021-05-13 (×2): qty 1

## 2021-05-13 MED ORDER — SENNOSIDES-DOCUSATE SODIUM 8.6-50 MG PO TABS
1.0000 | ORAL_TABLET | Freq: Two times a day (BID) | ORAL | Status: DC
  Administered 2021-05-13 – 2021-05-15 (×5): 1 via ORAL
  Filled 2021-05-13 (×6): qty 1

## 2021-05-13 MED ORDER — ACETAMINOPHEN 325 MG PO TABS
650.0000 mg | ORAL_TABLET | ORAL | Status: DC | PRN
  Administered 2021-05-14: 650 mg via ORAL
  Filled 2021-05-13: qty 2

## 2021-05-13 MED ORDER — PANTOPRAZOLE SODIUM 40 MG IV SOLR
40.0000 mg | Freq: Every day | INTRAVENOUS | Status: DC
  Administered 2021-05-13: 40 mg via INTRAVENOUS
  Filled 2021-05-13: qty 10

## 2021-05-13 NOTE — Consult Note (Signed)
? ?Referring Physician:  ?No referring provider defined for this encounter. ? ?Primary Physician:  ?Derrick Crouch, MD ? ?Chief Complaint:  fall ? ?History of Present Illness: ?05/13/2021 ?Derrick Cochran is a 86 y.o. male who presents with the chief complaint of fall.  He has multiple medical issues as noted in [place].  He is on anticoagulation due to AF.  He has some baseline memory issues, but lives at home with wife. ? ?He was seen in ER where small SDH/SAH/IVH was noted consistent with trauma.  He was reversed with Kcentra and given platelets due to thrombocytopenia. ? ?The patient is unable to give a full history, but does not report any symptoms currently. ? ?He is COVID+. ? ?Review of Systems:  ?A 10 point review of systems is negative, except for the pertinent positives and negatives detailed in the HPI. ? ?Past Medical History: ?Past Medical History:  ?Diagnosis Date  ? AAA (abdominal aortic aneurysm)   ? Arrhythmia   ? atrial fibrillation  ? Arthritis   ? BPH (benign prostatic hyperplasia)   ? Cancer [facility])   ? skin  ? CHF (congestive heart failure) ([name])   ? Coronary artery disease   ? DDD (degenerative disc disease), thoracic   ? Diabetes mellitus without complication ([name])   ? Esophageal stricture   ? GERD (gastroesophageal reflux disease)   ? Heart murmur   ? Hyperlipidemia   ? Hypertension   ? Hypothyroidism   ? Myocardial infarction [facility])   ? Presence of permanent cardiac pacemaker   ? Shortness of breath dyspnea   ? ? ?Past Surgical History: ?Past Surgical History:  ?Procedure Laterality Date  ? AORTIC VALVE REPLACEMENT  1995  ? Bioprosthetic - [facility]  ? APPENDECTOMY    ? CARDIAC CATHETERIZATION  1994 x 2 with PCTA of RCA  ? CARDIAC VALVE REPLACEMENT    ? Bovine transcatheter heart valve  ? COLONOSCOPY  2015  ? CORONARY ARTERY BYPASS GRAFT  1995  ? x 4 Vessels - [facility]  ? ELECTROPHYSIOLOGIC STUDY N/A 08/06/2014  ? Procedure: Cardioversion;  Surgeon: [name], MD;  Location: [facility];  Service: Cardiovascular;  Laterality: N/A;  ? ELECTROPHYSIOLOGIC STUDY N/A 06/18/2014  ? Procedure: CARDIOVERSION;  Surgeon: [name], MD;  Location: [facility] ORS;  Service: Cardiovascular;  Laterality: N/A;  ? ESOPHAGOGASTRODUODENOSCOPY  2014  ? EYE SURGERY    ? GREAT TOE ARTHRODESIS, INTERPHALANGEAL JOINT Left   ? HERNIA REPAIR Left   ? Inguinal  ? IR RADIOLOGIST EVAL & MGMT  11/04/2019  ? PACEMAKER INSERTION N/A 07/02/2014  ? Procedure: INSERTION PACEMAKER/DUAL CHAMBER  INITIAL IMPLANT;  Surgeon: [name], MD;  Location: [facility] ORS;  Service: Cardiovascular;  Laterality: N/A;  ? SKIN BIOPSY    ? ? ?Allergies: ?Allergies as of 05/12/2021 - Review Complete 05/12/2021  ?Allergen Reaction Noted  ? Adhesive [tape] Rash 06/29/2014  ? Cefdinir Other (See Comments) 04/23/2015  ? Sulfa antibiotics Rash 06/18/2014  ? Sulfamethoxazole Hives, Itching, and Rash 08/05/2014  ? ? ?Medications: ? ?Current Facility-Administered Medications:  ?   stroke: mapping our early stages of recovery book, , Does not apply, Once, Para Skeans, MD ?  acetaminophen (TYLENOL) tablet 650 mg, 650 mg, Oral, Q4H PRN **OR** acetaminophen (TYLENOL) 160 MG/5ML solution 650 mg, 650 mg, Per Tube, Q4H PRN **OR** acetaminophen (TYLENOL) suppository 650 mg, 650 mg, Rectal, Q4H PRN, Para Skeans, MD ?  enalapril (VASOTEC) tablet 5 mg, 5 mg, Oral, Daily, Posey Pronto, Ekta  V, MD ?  gabapentin (NEURONTIN) capsule 300 mg, 300 mg, Oral, QHS, Para Skeans, MD, 300 mg at 05/13/21 0217 ?  HYDROcodone-acetaminophen (NORCO/VICODIN) 5-325 MG per tablet 1 tablet, 1 tablet, Oral, Q6H PRN, [name], MD ?  insulin aspart (novoLOG) injection 0-9 Units, 0-9 Units, Subcutaneous, TID WC, [name], MD ?  levothyroxine (SYNTHROID) tablet 150 mcg, 150 mcg, Oral, Q0600, Para Skeans, MD ?  metoprolol succinate (TOPROL-XL) 24 hr tablet 50 mg, 50 mg, Oral, Daily, [name], MD ?  pantoprazole (PROTONIX) injection 40 mg, 40 mg, Intravenous, QHS, Para Skeans, MD, 40 mg at 05/13/21 6389 ?  senna-docusate (Senokot-S) tablet 1 tablet, 1 tablet, Oral, BID, Para Skeans, MD, 1 tablet at 05/13/21 0217 ?  sotalol (BETAPACE) tablet 120 mg, 120 mg, Oral, BID, Para Skeans, MD ? ?Current Outpatient Medications:  ?  acetaminophen (TYLENOL) 500 MG tablet, Take 500 mg by mouth every 6 (six) hours as needed for mild pain, moderate pain or fever. , Disp: , Rfl:  ?  azithromycin (ZITHROMAX) 250 MG tablet, Take 500 mg by mouth once daily on day one. Then take 250 mg daily on days 2 through 5., Disp: , Rfl:  ?  DULoxetine (CYMBALTA) 20 MG capsule, Take 20 mg by mouth daily., Disp: , Rfl:  ?  enalapril (VASOTEC) 5 MG tablet, Take 1 tablet (5 mg total) by mouth daily., Disp: 30 tablet, Rfl: 0 ?  furosemide (LASIX) 20 MG tablet, Take 1 tablet (20 mg total) by mouth 2 (two) times daily. (Patient taking differently: Take 20 mg by mouth daily. Additional 20 mg if needed), Disp: 60 tablet, Rfl: 1 ?  gabapentin (NEURONTIN) 300 MG capsule, Take 300 mg by mouth at bedtime., Disp: , Rfl:  ?  glipiZIDE (GLUCOTROL XL) 5 MG 24 hr tablet, Take 5 mg by mouth daily with breakfast. , Disp: , Rfl:  ?  HYDROcodone-acetaminophen (NORCO) 5-325 MG tablet, Take 1 tablet by mouth every 6 (six) hours as needed for up to 6 doses for moderate pain., Disp: 6 tablet, Rfl: 0 ?  levocetirizine (XYZAL) 5 MG tablet, Take 5 mg by mouth every evening., Disp: , Rfl:  ?  levothyroxine (SYNTHROID) 150 MCG tablet, Take 150 mcg by mouth daily before breakfast. , Disp: , Rfl:  ?  metFORMIN (GLUCOPHAGE) 500 MG tablet, Take 500 mg by mouth 2 (two) times daily. , Disp: , Rfl:  ?  metoprolol succinate (TOPROL-XL) 50 MG 24 hr tablet, Take 50 mg by mouth daily. , Disp: , Rfl:  ?  nitroGLYCERIN (NITROSTAT) 0.4 MG SL tablet, Place 0.4 mg under the tongue every 5 (five) minutes as needed for chest pain., Disp: , Rfl:  ?  omeprazole (PRILOSEC) 20 MG capsule, Take 20 mg by mouth daily. , Disp: , Rfl:  ?  simvastatin (ZOCOR) 40 MG  tablet, Take 40 mg by mouth every evening. , Disp: , Rfl:  ?  sotalol (BETAPACE) 120 MG tablet, Take 120 mg by mouth 2 (two) times daily., Disp: , Rfl:  ?  tamsulosin (FLOMAX) 0.4 MG CAPS capsule, Take 0.4 mg by mouth every morning. , Disp: , Rfl:  ?  temazepam (RESTORIL) 30 MG capsule, Take 30-60 mg by mouth at bedtime as needed for sleep. , Disp: , Rfl:  ?  warfarin (COUMADIN) 1 MG tablet, Take 1 mg by mouth daily., Disp: , Rfl:  ? ? ?Social History: ?Social History  ? ?Tobacco Use  ? Smoking status:  Former  ?  Packs/day: 0.50  ?  Years: 15.00  ?  Pack years: 7.50  ?  Types: Cigarettes  ?  Quit date: 04/22/1985  ?  Years since quitting: 36.0  ? Smokeless tobacco: Former  ?  Types: Chew  ?Vaping Use  ? Vaping Use: Never used  ?Substance Use Topics  ? Alcohol use: No  ? Drug use: No  ? ? ?Family Medical History: ?Family History  ?Problem Relation Age of Onset  ? Cancer Mother   ? Lung cancer Father   ? Kidney disease Sister   ? Heart failure Brother   ? ? ?Physical Examination: ?Vitals:  ? 05/13/21 0500 05/13/21 0515  ?BP: (!) 114/50   ?Pulse: 67 67  ?Resp:  16  ?Temp:    ?SpO2:  93%  ? ? ? ?General: Patient is well developed, well nourished, calm, collected, and in no apparent distress. ? ?Psychiatric: Patient is non-anxious. ? ?Head:  Pupils equal, round, and reactive to light. ? ?ENT:  Oral mucosa appears well hydrated. ? ?Neck:   Supple.  Full range of motion. ? ?Respiratory: Patient is breathing without any difficulty. ? ?Extremities: No edema. ? ?Vascular: Palpable pulses in dorsal pedal vessels. ? ?Skin:   On exposed skin, there are no abnormal skin lesions. ? ?NEUROLOGICAL:  ?General: In no acute distress.   ?Awake, alert, oriented to person, place, and March "1983."  Pupils equal round and reactive to light.  Facial tone is symmetric.  Tongue protrusion is midline.  There is no pronator drift. ? ?ROM of spine: full.   ? ?Strength: ?Side Biceps Triceps Deltoid Interossei Grip Wrist Ext. Wrist Flex.  ?R '4 5 5  5 5 5 5  '$ ?L '5 5 5 5 5 5 5  '$ ? ?Side Iliopsoas Quads Hamstring PF DF EHL  ?R '5 5 5 5 5 5  '$ ?L '5 5 5 5 5 5  '$ ? ? ?Bilateral upper and lower extremity sensation is intact to light touch. ?Reflexes are 1+ and

## 2021-05-13 NOTE — ED Notes (Signed)
Staff to bedside to bed alarm going off. Pt was up out of bed using urinal. Patient walked back top bed and bed alarm turned back on.  ?

## 2021-05-13 NOTE — Progress Notes (Signed)
Speech Language Pathology Evaluation ?Patient Details ?Name: Derrick Cochran ?MRN: [id number] ?DOB: 01-28-1936 ?Today's Date: 05/13/2021 ?Time: 1700-1749 ?SLP Time Calculation (min) (ACUTE ONLY): 20 min ? ?Problem List:  ?Patient Active Problem List  ? Diagnosis Date Noted  ? Subdural hematoma 05/13/2021  ? Fall at home, initial encounter 05/13/2021  ? Hypothyroidism 05/12/2021  ? Coronary artery disease 05/12/2021  ? BPH (benign prostatic hyperplasia) 05/12/2021  ? Chronic cholecystitis 11/21/2019  ? Acute cholecystitis 10/01/2019  ? CHF (congestive heart failure) ([place]) 09/23/2017  ? AAA (abdominal aortic aneurysm) without rupture 02/15/2016  ? Palpitations 11/28/2015  ? Dyspnea 11/28/2015  ? Chronic systolic heart failure ([place]) 04/23/2015  ? HTN (hypertension) 04/23/2015  ? Diabetes ([place]) 04/23/2015  ? Sick sinus syndrome ([place]) 07/03/2014  ? Atrial fibrillation ([place]) 06/17/2014  ? S/p TAVR (transcatheter aortic valve replacement), bioprosthetic 04/14/2014  ? MI (myocardial infarction) ([place]) 09/30/2013  ? ?Past Medical History:  ?Past Medical History:  ?Diagnosis Date  ? AAA (abdominal aortic aneurysm)   ? Arrhythmia   ? atrial fibrillation  ? Arthritis   ? BPH (benign prostatic hyperplasia)   ? Cancer [facility])   ? skin  ? CHF (congestive heart failure) ([place])   ? Coronary artery disease   ? DDD (degenerative disc disease), thoracic   ? Diabetes mellitus without complication ([place])   ? Esophageal stricture   ? GERD (gastroesophageal reflux disease)   ? Heart murmur   ? Hyperlipidemia   ? Hypertension   ? Hypothyroidism   ? Myocardial infarction [facility])   ? Presence of permanent cardiac pacemaker   ? Shortness of breath dyspnea   ? ?Past Surgical History:  ?Past Surgical History:  ?Procedure Laterality Date  ? AORTIC VALVE REPLACEMENT  1995  ? Bioprosthetic - [facility]  ? APPENDECTOMY    ? CARDIAC CATHETERIZATION  1994 x 2 with PCTA of RCA  ? CARDIAC VALVE REPLACEMENT    ? Bovine transcatheter heart valve  ? COLONOSCOPY  2015  ?  CORONARY ARTERY BYPASS GRAFT  1995  ? x 4 Vessels - [facility]  ? ELECTROPHYSIOLOGIC STUDY N/A 08/06/2014  ? Procedure: Cardioversion;  Surgeon: [name], MD;  Location: [facility];  Service: Cardiovascular;  Laterality: N/A;  ? ELECTROPHYSIOLOGIC STUDY N/A 06/18/2014  ? Procedure: CARDIOVERSION;  Surgeon: [name], MD;  Location: [facility] ORS;  Service: Cardiovascular;  Laterality: N/A;  ? ESOPHAGOGASTRODUODENOSCOPY  2014  ? EYE SURGERY    ? GREAT TOE ARTHRODESIS, INTERPHALANGEAL JOINT Left   ? HERNIA REPAIR Left   ? Inguinal  ? IR RADIOLOGIST EVAL & MGMT  11/04/2019  ? PACEMAKER INSERTION N/A 07/02/2014  ? Procedure: INSERTION PACEMAKER/DUAL CHAMBER  INITIAL IMPLANT;  Surgeon: [name], MD;  Location: [facility] ORS;  Service: Cardiovascular;  Laterality: N/A;  ? SKIN BIOPSY    ? ?HPI:  ?86 y.o. male  with pmh CAD s/p MI, GERD, HTN, HLD, atrial fibrillation on Coumadin who presents after a fall. Per son oriented x3 at baseline, possible "mild dementia." CT head on 05/12/21 showed 19m parafalcine SDH as well as small SAH. Covid+. Per MD notes, history of forgetfulness, unsteady gait, multiple falls in past 6 months.  ? ?Assessment / Plan / Recommendation ?Clinical Impression ?  Patient presents with at least moderate cognitive communication impairments characterized by deficits in orientation, attention, and memory (and therefore higher level cognitive functions). No family present to determine baseline function, however "forgetfulness" and several recent falls in the last 6 months are noted in pt's chart.  Patient is oriented to self, disoriented to location, time and situation. He stated the year as "1984", however he was able to name the current president. Patient scored 14/30 on the Mini Mental State Examination (>26 is WNL). He had difficulty with orientation, delayed recall, attention and problem solving tasks. Speech is clear without dysarthria, no focal oral motor deficits noted. Pt did  have some hesitation, wordfinding difficulty at the conversation level. SLP to follow during acute stay to improve safety awareness and for further diagnostic intervention to aid determination of appropriate/necessary services at discharge.  ?   ?SLP Assessment ? SLP Recommendation/Assessment: Patient needs continued [facility] ?SLP Visit Diagnosis: Cognitive communication deficit (R41.841)  ?  ?Recommendations for follow up therapy are one component of a multi-disciplinary discharge planning process, led by the attending physician.  Recommendations may be updated based on patient status, additional functional criteria and insurance authorization. ?   ?Follow Up Recommendations ? Other (comment) (tbd)  ?  ?Assistance Recommended at Discharge ? Frequent or constant Supervision/Assistance  ?Functional Status Assessment Patient has had a recent decline in their functional status and demonstrates the ability to make significant improvements in function in a reasonable and predictable amount of time.  ?Frequency and Duration min 1 x/week  ?  ?  ?   ?SLP Evaluation ?Cognition ? Overall Cognitive Status: Impaired/Different from baseline ?Arousal/Alertness: Awake/alert ?Orientation Level: Oriented to person;Disoriented to place;Disoriented to time;Disoriented to situation ?Year:  (1984) ?Month: March ?Day of Week: Incorrect ?Attention: Sustained ?Selective Attention: Impaired ?Selective Attention Impairment:  (distracted by noises in room) ?Memory: Impaired ?Memory Impairment:  (recalled 2/3 words with 30 second delay) ?Awareness: Impaired ?Awareness Impairment: Emergent impairment ?Problem Solving: Impaired ?Problem Solving Impairment: Verbal basic ?Executive Function:  (impaired due to lower level deficits) ?Safety/Judgment: Impaired (ambulated in room without assistance per RN)  ?  ?   ?Comprehension ? Auditory Comprehension ?Overall Auditory Comprehension: Other (comment) (appears WFL; Hard of  hearing) ?Conversation: Simple ?Interfering Components: Hearing;Attention ?EffectiveTechniques: Increased volume ?Visual Recognition/Discrimination ?Discrimination: Not tested ?Reading Comprehension ?Reading Status: Within funtional limits (sentence level)  ?  ?Expression Expression ?Primary Mode of Expression: Verbal ?Verbal Expression ?Overall Verbal Expression: Impaired ?Level of Generative/Spontaneous Verbalization: Conversation (some hesitation, wordfinding difficulties) ?Written Expression ?Dominant Hand: Right ?Written Expression: Not tested   ?Oral / Motor ? Oral Motor/Sensory Function ?Overall Oral Motor/Sensory Function: Within functional limits ?Motor Speech ?Overall Motor Speech: Appears within functional limits for tasks assessed   ?        ?Deneise Lever, MS, CCC-SLP ?Speech-Language Pathologist ?Office: [phone] ?ASCOM: [phone] ? ?Aliene Altes ?05/13/2021, 12:54 PM ? ? ?

## 2021-05-13 NOTE — Progress Notes (Signed)
Admission profile updated. ?

## 2021-05-13 NOTE — Progress Notes (Signed)
?PROGRESS NOTE ? ? ? ?NALU TROUBLEFIELD  TLX:[id number] DOB: 13-Jun-1935 DOA: 05/12/2021 ?PCP: [name], MD  ?120A/120A-AA ? ? ?Assessment & Plan: ?  ?Principal Problem: ?  Fall at home, initial encounter ?Active Problems: ?  Subdural hematoma ?  Atrial fibrillation ([place]) ?  Sick sinus syndrome ([place]) ?  Chronic systolic heart failure ([place]) ?  HTN (hypertension) ?  Diabetes ([place]) ?  Hypothyroidism ?  Coronary artery disease ?  BPH (benign prostatic hyperplasia) ? ? ?Derrick Cochran is a 86 y.o. male with medical history significant of CAD, dementia, pacemaker, HTN, hypothyroidism, Afib on warfarin, who presented after a fall at home.   ?  ?Subdural hematoma: ?Patient found to have subdural hematoma as a result of his fall in the setting of taking warfarin and INR 3.9. ?--was given Kcentra for reversal of supratherapeutic INR and also given vitamin K.  ?--Neurosurgery Dr. Cari Caraway has been consulted by ED physician.  repeat CT head relatively stable ?Plan: ?--repeat CT head tomorrow ?- Monitor platelets and transfuse for platelets <50k ?--ok to work with PT ? ?Fall: ?--pt has no recollection of the events surrounding the fall ?--PT ?--check orthostatic BP ?--monitor on tele ? ?Chronic Atrial fibrillation on home warfarin ?Patient is currently rate controlled  ?--per Dr. Saralyn Pilar, pt was taken off of sotalol. ?Plan: ?--cont Toprol ?--hold warfarin 2/2 brain bleeds ? ?Elevated lactic acid, resolved ?--normalized after IVF ?  ?COVID-positive: ?Patient per history has been having cold symptoms for the past few days.  No infiltrates on chest x-ray no hypoxia.   ?--no need to start covid tx ?  ?Hypertension ?--BP intermittently soft ?--Hold home enalapril ?--cont home metop ?  ?Diabetes, well controlled ?--hold patient's glipizide and metformin. ?Glycemic protocol. ?  ?Hypothyroidism: ?--cont levothyroxine 150 mcg. ?  ?GERD ?--d/c IV PPI and resume home oral PPI ? ? ?DVT prophylaxis: SCD/Compression stockings ?Code  Status: Full code  ?Family Communication:  ?Level of care: Med-Surg ?Dispo:   ?The patient is from: home ?Anticipated d/c is to: home ?Anticipated d/c date is: 1-2 days ?Patient currently is not medically ready to d/c due to: need repeat CT head ? ? ?Subjective and Interval History:  ?Pt denied headache or pain in his head.  ? ? ?Objective: ?Vitals:  ? 05/13/21 1230 05/13/21 1245 05/13/21 1300 05/13/21 1500  ?BP:   (!) 125/46 (!) 118/51  ?Pulse: 60  63 64  ?Resp: '19  19 12  '$ ?Temp:    97.9 ?F (36.6 ?C)  ?TempSrc:    Oral  ?SpO2: 94% 96% 97% 96%  ?Weight:      ?Height:      ? ? ?Intake/Output Summary (Last 24 hours) at 05/13/2021 1556 ?Last data filed at 05/13/2021 0300 ?Gross per 24 hour  ?Intake 1297.71 ml  ?Output 250 ml  ?Net 1047.71 ml  ? ?Filed Weights  ? 05/12/21 1607  ?Weight: 79.4 kg  ? ? ?Examination:  ? ?Constitutional: NAD, alert oriented to person and place ?HEENT: conjunctivae and lids normal, EOMI ?CV: No cyanosis.   ?RESP: normal respiratory effort, on RA ?Extremities: No effusions, edema in BLE ?SKIN: warm, dry ?Neuro: II - XII grossly intact.   ? ? ?Data Reviewed: I have personally reviewed following labs and imaging studies ? ?CBC: ?Recent Labs  ?Lab 05/12/21 ?1612 05/12/21 ?2331  ?WBC 5.0 3.1*  ?HGB 13.1 11.2*  ?HCT 40.4 34.1*  ?MCV 100.7* 99.7  ?PLT 67* 60*  ? ?Basic Metabolic Panel: ?Recent Labs  ?Lab 05/12/21 ?1612  ?  NA 140  ?K 4.1  ?CL 104  ?CO2 24  ?GLUCOSE 154*  ?BUN 33*  ?CREATININE 1.22  ?CALCIUM 8.5*  ? ?GFR: ?Estimated Creatinine Clearance: 46.5 mL/min (by C-G formula based on SCr of 1.22 mg/dL). ?Liver Function Tests: ?Recent Labs  ?Lab 05/12/21 ?1617  ?AST 25  ?ALT 10  ?ALKPHOS 49  ?BILITOT 1.6*  ?PROT 7.9  ?ALBUMIN 3.1*  ? ?No results for input(s): LIPASE, AMYLASE in the last 168 hours. ?No results for input(s): AMMONIA in the last 168 hours. ?Coagulation Profile: ?Recent Labs  ?Lab 05/12/21 ?1740 05/12/21 ?1944 05/13/21 ?7209  ?INR 3.9* 1.4* 1.2  ? ?Cardiac Enzymes: ?No results for  input(s): CKTOTAL, CKMB, CKMBINDEX, TROPONINI in the last 168 hours. ?BNP (last 3 results) ?No results for input(s): PROBNP in the last 8760 hours. ?HbA1C: ?Recent Labs  ?  05/12/21 ?1607  ?HGBA1C 6.8*  ? ?CBG: ?Recent Labs  ?Lab 05/13/21 ?0223 05/13/21 ?0750 05/13/21 ?1210  ?GLUCAP 109* 97 228*  ? ?Lipid Profile: ?No results for input(s): CHOL, HDL, LDLCALC, TRIG, CHOLHDL, LDLDIRECT in the last 72 hours. ?Thyroid Function Tests: ?No results for input(s): TSH, T4TOTAL, FREET4, T3FREE, THYROIDAB in the last 72 hours. ?Anemia Panel: ?No results for input(s): VITAMINB12, FOLATE, FERRITIN, TIBC, IRON, RETICCTPCT in the last 72 hours. ?Sepsis Labs: ?Recent Labs  ?Lab 05/12/21 ?1750 05/12/21 ?1944  ?LATICACIDVEN 3.1* 1.5  ? ? ?Recent Results (from the past 240 hour(s))  ?Resp Panel by RT-PCR (Flu A&B, Covid) Nasopharyngeal Swab     Status: Abnormal  ? Collection Time: 05/12/21  5:41 PM  ? Specimen: Nasopharyngeal Swab; Nasopharyngeal(NP) swabs in vial transport medium  ?Result Value Ref Range Status  ? SARS Coronavirus 2 by RT PCR POSITIVE (A) NEGATIVE Final  ?  Comment: (NOTE) ?SARS-CoV-2 target nucleic acids are DETECTED. ? ?The SARS-CoV-2 RNA is generally detectable in upper respiratory ?specimens during the acute phase of infection. Positive results are ?indicative of the presence of the identified virus, but do not rule ?out bacterial infection or co-infection with other pathogens not ?detected by the test. Clinical correlation with patient history and ?other diagnostic information is necessary to determine patient ?infection status. The expected result is Negative. ? ?Fact Sheet for Patients: ?EntrepreneurPulse.com.au ? ?Fact Sheet for Healthcare Providers: ?IncredibleEmployment.be ? ?This test is not yet approved or cleared by the [place] FDA and  ?has been authorized for detection and/or diagnosis of SARS-CoV-2 by ?FDA under an Emergency Use Authorization (EUA).  This EUA  will ?remain in effect (meaning this test can be used) for the duration of  ?the COVID-19 declaration under Section 564(b)(1) of the A ct, 21 ?U.S.C. section 360bbb-3(b)(1), unless the authorization is ?terminated or revoked sooner. ? ?  ? Influenza A by PCR NEGATIVE NEGATIVE Final  ? Influenza B by PCR NEGATIVE NEGATIVE Final  ?  Comment: (NOTE) ?The Xpert Xpress SARS-CoV-2/FLU/RSV plus assay is intended as an aid ?in the diagnosis of influenza from Nasopharyngeal swab specimens and ?should not be used as a sole basis for treatment. Nasal washings and ?aspirates are unacceptable for Xpert Xpress SARS-CoV-2/FLU/RSV ?testing. ? ?Fact Sheet for Patients: ?EntrepreneurPulse.com.au ? ?Fact Sheet for Healthcare Providers: ?IncredibleEmployment.be ? ?This test is not yet approved or cleared by the [place] FDA and ?has been authorized for detection and/or diagnosis of SARS-CoV-2 by ?FDA under an Emergency Use Authorization (EUA). This EUA will remain ?in effect (meaning this test can be used) for the duration of the ?COVID-19 declaration under Section 564(b)(1) of the Act, 21  U.S.C. ?section 360bbb-3(b)(1), unless the authorization is terminated or ?revoked. ? ?Performed at [facility], [place], ?[place] [address] ?  ?Blood culture (routine x 2)     Status: None (Preliminary result)  ? Collection Time: 05/12/21  7:44 PM  ? Specimen: BLOOD  ?Result Value Ref Range Status  ? Specimen Description BLOOD RIGHT FOREARM  Final  ? Special Requests   Final  ?  BOTTLES DRAWN AEROBIC AND ANAEROBIC Blood Culture adequate volume  ? Culture   Final  ?  NO GROWTH < 12 HOURS ?Performed at [facility], [address], [place], [place] [address] ?  ? Report Status PENDING  Incomplete  ?Blood culture (routine x 2)     Status: None (Preliminary result)  ? Collection Time: 05/12/21  9:00 PM  ? Specimen: BLOOD  ?Result Value Ref Range Status  ? Specimen Description  BLOOD RIGHT ASSIST CONTROL  Final  ? Special Requests   Final  ?  BOTTLES DRAWN AEROBIC AND ANAEROBIC Blood Culture adequate volume  ? Culture   Final  ?  NO GROWTH < 12 HOURS ?Performed at [facility]

## 2021-05-13 NOTE — ED Notes (Signed)
Pt ate entire breakfast tray.  ?

## 2021-05-13 NOTE — ED Notes (Signed)
Pt set off bed alarm while repositioning self on stretcher. Checked on pt who denied any needs.  ?

## 2021-05-13 NOTE — ED Notes (Signed)
Based on pt's current vital signs, messaged provider Lai via secure chat about metoprolol succinate and enalapril. Awaiting reply. Speech therapy at bedside.  ?

## 2021-05-13 NOTE — ED Notes (Addendum)
RN to bedside to introduce self to pt. Pt is awake and asking why he is here. He is slightly confused. He was redirected to stay in bed until we figured things out.  ?

## 2021-05-13 NOTE — Progress Notes (Signed)
[facility] pharmacy monitoring: ? ?Patient on warfarin PTA and received Kcentra d/t [place] on 05/12/21 @ 1849 ? ?3/31 '@0641'$  INR=1.2 ? ?INR ordered for am 4/1 ? ?Chinita Greenland PharmD ?Clinical Pharmacist ?05/13/2021 ? ? ? ? ?

## 2021-05-14 ENCOUNTER — Inpatient Hospital Stay: Payer: Medicare HMO

## 2021-05-14 DIAGNOSIS — W19XXXA Unspecified fall, initial encounter: Secondary | ICD-10-CM | POA: Diagnosis not present

## 2021-05-14 DIAGNOSIS — I4891 Unspecified atrial fibrillation: Secondary | ICD-10-CM | POA: Diagnosis not present

## 2021-05-14 DIAGNOSIS — Y92009 Unspecified place in unspecified non-institutional (private) residence as the place of occurrence of the external cause: Secondary | ICD-10-CM | POA: Diagnosis not present

## 2021-05-14 DIAGNOSIS — I951 Orthostatic hypotension: Secondary | ICD-10-CM

## 2021-05-14 LAB — BASIC METABOLIC PANEL
Anion gap: 11 (ref 5–15)
BUN: 29 mg/dL — ABNORMAL HIGH (ref 8–23)
CO2: 24 mmol/L (ref 22–32)
Calcium: 8.4 mg/dL — ABNORMAL LOW (ref 8.9–10.3)
Chloride: 106 mmol/L (ref 98–111)
Creatinine, Ser: 1.1 mg/dL (ref 0.61–1.24)
GFR, Estimated: >60 mL/min (ref >60)
Glucose, Bld: 121 mg/dL — ABNORMAL HIGH (ref 70–99)
Potassium: 4.2 mmol/L (ref 3.5–5.1)
Sodium: 141 mmol/L (ref 135–145)

## 2021-05-14 LAB — CBC
HCT: 34.6 % — ABNORMAL LOW (ref 39.0–52.0)
Hemoglobin: 11.3 g/dL — ABNORMAL LOW (ref 13.0–17.0)
MCH: 32.4 pg (ref 26.0–34.0)
MCHC: 32.7 g/dL (ref 30.0–36.0)
MCV: 99.1 fL (ref 80.0–100.0)
Platelets: 88 10*3/uL — ABNORMAL LOW (ref 150–400)
RBC: 3.49 MIL/uL — ABNORMAL LOW (ref 4.22–5.81)
RDW: 12.9 % (ref 11.5–15.5)
WBC: 4 10*3/uL (ref 4.0–10.5)
nRBC: 0 % (ref 0.0–0.2)

## 2021-05-14 LAB — PROTIME-INR
INR: 1.2 (ref 0.8–1.2)
Prothrombin Time: 15 seconds (ref 11.4–15.2)

## 2021-05-14 LAB — GLUCOSE, CAPILLARY
Glucose-Capillary: 104 mg/dL — ABNORMAL HIGH (ref 70–99)
Glucose-Capillary: 133 mg/dL — ABNORMAL HIGH (ref 70–99)
Glucose-Capillary: 145 mg/dL — ABNORMAL HIGH (ref 70–99)
Glucose-Capillary: 188 mg/dL — ABNORMAL HIGH (ref 70–99)

## 2021-05-14 LAB — MAGNESIUM: Magnesium: 1.5 mg/dL — ABNORMAL LOW (ref 1.7–2.4)

## 2021-05-14 MED ORDER — QUETIAPINE FUMARATE 25 MG PO TABS
25.0000 mg | ORAL_TABLET | Freq: Every day | ORAL | Status: DC
  Administered 2021-05-14: 25 mg via ORAL
  Filled 2021-05-14: qty 1

## 2021-05-14 MED ORDER — HALOPERIDOL LACTATE 5 MG/ML IJ SOLN
1.0000 mg | Freq: Four times a day (QID) | INTRAMUSCULAR | Status: DC | PRN
  Administered 2021-05-14: 1 mg via INTRAVENOUS
  Filled 2021-05-14: qty 1

## 2021-05-14 MED ORDER — SODIUM CHLORIDE 0.9 % IV SOLN: INTRAVENOUS | Status: AC

## 2021-05-14 NOTE — Evaluation (Signed)
Occupational Therapy Evaluation ?Patient Details ?Name: Derrick Cochran ?MRN: [id number] ?DOB: [birth year] ?Today's Date: 05/14/2021 ? ? ?History of Present Illness Pt is an 86 y/o M admitted on 05/12/21 after presenting with c/c of fall. Pt found to have SDH/SAH/IVH. PMH: CAD, CHF, pacemaker, HTN, hypothyroidism, GERD, a-fib, AAA, memory issues/dementia, AAA, heart murmur, MI  ? ?Clinical Impression ?  ?Chart reviewed,pt greeted in bedside chair,alert to self only. Pt does demonstrate carry over of reorientation cueing at end of tx session. Pt lives with his wife who provides assistance as needed however pt is generally performing ADL at supervision level.  Wife reports 43 year old son also available to assist. Pt has become increasingly confused recently per wife report. Pt becomes orthostatic after amb to bathroom reporting feeling"tired", but not dizzy. Pt is performing ADL/functional mobility below baseline, will benefit from [facility] to address functional deficits. Pt is left in  chair +chair alarm. OT will continue to follow while admitted.  ?   ? ?Recommendations for follow up therapy are one component of a multi-disciplinary discharge planning process, led by the attending physician.  Recommendations may be updated based on patient status, additional functional criteria and insurance authorization.  ? ?Follow Up Recommendations ? Home health OT  ?  ?Assistance Recommended at Discharge Frequent or constant Supervision/Assistance  ?Patient can return home with the following A little help with walking and/or transfers;A little help with bathing/dressing/bathroom;Assistance with cooking/housework;Direct supervision/assist for financial management;Direct supervision/assist for medications management;Assist for transportation;Assistance with feeding ? ?  ?Functional Status Assessment ? Patient has had a recent decline in their functional status and demonstrates the ability to make significant improvements in function in a  reasonable and predictable amount of time.  ?Equipment Recommendations ? None recommended by OT;Other (comment) (pt has recommended equipment)  ?  ?Recommendations for Other Services   ? ? ?  ?Precautions / Restrictions Precautions ?Precautions: Fall ?Restrictions ?Weight Bearing Restrictions: No  ? ?  ? ?Mobility Bed Mobility ?  ?  ?  ?  ?  ?  ?  ?General bed mobility comments: recieved in chair ?  ? ?Transfers ?Overall transfer level: Needs assistance ?Equipment used: Rolling walker (2 wheels) ?Transfers: Sit to/from Stand ?Sit to Stand: Min guard ?  ?  ?  ?  ?  ?  ?  ? ?  ?Balance Overall balance assessment: Needs assistance ?Sitting-balance support: Feet supported ?Sitting balance-Leahy Scale: Good ?  ?  ?Standing balance support: During functional activity, Bilateral upper extremity supported ?Standing balance-Leahy Scale: Poor ?  ?  ?  ?  ?  ?  ?  ?  ?  ?  ?  ?  ?   ? ?ADL either performed or assessed with clinical judgement  ? ?ADL Overall ADL's : Needs assistance/impaired ?Eating/Feeding: Set up ?  ?Grooming: Wash/dry hands;Wash/dry face;Sitting;Oral care;Set up ?  ?  ?  ?  ?  ?Upper Body Dressing : Minimal assistance ?Upper Body Dressing Details (indicate cue type and reason): anticipated ?Lower Body Dressing: Minimal assistance ?Lower Body Dressing Details (indicate cue type and reason): anticipated ?Toilet Transfer: Supervision/safety;Ambulation;Rolling walker (2 wheels) ?Toilet Transfer Details (indicate cue type and reason): simulated ?  ?  ?  ?  ?Functional mobility during ADLs: Supervision/safety;Min guard;Rolling walker (2 wheels) ?General ADL Comments: pt orthostatic after standing for approx 3 minutes  ? ? ? ?Vision Patient Visual Report: No change from baseline ?   ?   ?Perception   ?  ?Praxis   ?  ? ?  Pertinent Vitals/Pain Pain Assessment ?Pain Assessment: No/denies pain  ? ? ? ?Hand Dominance Right ?  ?Extremity/Trunk Assessment Upper Extremity Assessment ?Upper Extremity Assessment: Overall WFL  for tasks assessed ?  ?Lower Extremity Assessment ?Lower Extremity Assessment: Overall WFL for tasks assessed ?  ?Cervical / Trunk Assessment ?Cervical / Trunk Assessment: Kyphotic ?  ?Communication Communication ?Communication: No difficulties ?  ?Cognition Arousal/Alertness: Awake/alert ?Behavior During Therapy: [facility] for tasks assessed/performed ?Overall Cognitive Status: Impaired/Different from baseline ?Area of Impairment: Orientation, Attention, Memory, Following commands ?  ?  ?  ?  ?  ?  ?  ?  ?Orientation Level: Disoriented to, Place, Time, Situation ?Current Attention Level: Sustained ?Memory: Decreased recall of precautions, Decreased short-term memory ?Following Commands: Follows multi-step commands inconsistently, Follows one step commands consistently ?  ?  ?  ?General Comments: pt wife reports pt has been more forgetful in the past few weeks, has a work up with PCP regarding this. ?  ?  ?General Comments  BP in sitting 140/63, after standing for approx 3 minutes 92/81, 117/84 after sitting back down; pt does not report dizziness but feels "tired"; team notified ? ?  ?Exercises   ?  ?Shoulder Instructions    ? ? ?Home Living Family/patient expects to be discharged to:: Private residence ?Living Arrangements: Spouse/significant other ?Available Help at Discharge: Family;Available 24 hours/day (lives with wife, 34 year old son) ?Type of Home: Mobile home ?Home Access: Ramped entrance ?  ?  ?Home Layout: One level ?  ?  ?Bathroom Shower/Tub: Tub/shower unit ?  ?Bathroom Toilet: Standard ?Bathroom Accessibility: Yes ?  ?Home Equipment: Rollator (4 wheels);Shower seat;Wheelchair - manual ?  ?  ? Lives With: Spouse ? ?  ?Prior Functioning/Environment   ?  ?  ?  ?  ?  ?  ?Mobility Comments: amb with rollator with wife providing supervision ?ADLs Comments: supervision- MIN A with all ADL from wife ?  ? ?  ?  ?OT Problem List: Decreased strength;Decreased activity tolerance;Decreased knowledge of use of DME or  AE;Decreased safety awareness;Impaired balance (sitting and/or standing);Decreased cognition ?  ?   ?OT Treatment/Interventions: Self-care/ADL training;Manual therapy;Therapeutic exercise;Modalities;Patient/family education;Balance training;Therapeutic activities;DME and/or AE instruction;Energy conservation  ?  ?OT Goals(Current goals can be found in the care plan section) Acute Rehab OT Goals ?Patient Stated Goal: go home ?OT Goal Formulation: With patient/family ?Time For Goal Achievement: 05/28/21 ?Potential to Achieve Goals: Good ?ADL Goals ?Pt Will Perform Grooming: (P) with supervision  ?OT Frequency: Min 2X/week ?  ? ?Co-evaluation   ?  ?  ?  ?  ? ?  ?AM-PAC OT "6 Clicks" Daily Activity     ?Outcome Measure Help from another person eating meals?: None ?Help from another person taking care of personal grooming?: None ?Help from another person toileting, which includes using toliet, bedpan, or urinal?: A Little ?Help from another person bathing (including washing, rinsing, drying)?: A Little ?Help from another person to put on and taking off regular upper body clothing?: A Little ?Help from another person to put on and taking off regular lower body clothing?: A Little ?6 Click Score: 20 ?  ?End of Session Equipment Utilized During Treatment: Gait belt;Rolling walker (2 wheels) ?Nurse Communication: Mobility status ? ?Activity Tolerance: Treatment limited secondary to medical complications (Comment);Other (comment) (orthostatics) ?Patient left: in chair;with call bell/phone within reach;with chair alarm set ? ?OT Visit Diagnosis: Unsteadiness on feet (R26.81);History of falling (Z91.81)  ?              ?  Time: 1828-8337 ?OT Time Calculation (min): 28 min ?Charges:  OT General Charges ?$OT Visit: 1 Visit ?OT Evaluation ?$OT Eval Moderate Complexity: 1 Mod ?OT Treatments ?$Self Care/Home Management : 8-22 mins ? ?Shanon Payor, OTD OTR/L  ?05/14/21, 11:27 AM  ?

## 2021-05-14 NOTE — TOC Initial Note (Signed)
Transition of Care (TOC) - Initial/Assessment Note  ? ? ?Patient Details  ?Name: Derrick Cochran ?MRN: [id number] ?Date of Birth: 1935/12/24 ? ?Transition of Care (TOC) CM/SW Contact:    ?Allisa Einspahr E Migdalia Olejniczak, LCSW ?Phone Number: ?05/14/2021, 10:34 AM ? ?Clinical Narrative:              Per chart review, patient is on airborne isolation and has some confusion. ?Called patient's wife. Patient lives with his wife and son. Wife provides transportation. PCP is Dr. Doy Hutching. [facility]acy is [facility]. Patient had [place] in the past, unknown agency. Patient has a wheelchair, RW, rollator, handicap bathroom, and ramp.  ?Explained PT rec for HHPT. Patient's wife stated she wants to think about it and agreed to let staff know if they decide they want [place] to arrange [facility] prior to DC. She stated she and her son do exercises with patient at home.    ? ? ?Expected Discharge Plan: Home/Self Care ?Barriers to Discharge: Continued Medical Work up ? ? ?Patient Goals and CMS Choice ?Patient states their goals for this hospitalization and ongoing recovery are:: home with spouse ?CMS Medicare.gov Compare Post Acute Care list provided to:: Patient Represenative (must comment) ?Choice offered to / list presented to : Spouse ? ?Expected Discharge Plan and Services ?Expected Discharge Plan: Home/Self Care ?  ?  ?  ?Living arrangements for the past 2 months: [place] ?                ?  ?  ?  ?  ?  ?  ?  ?  ?  ?  ? ?Prior Living Arrangements/Services ?Living arrangements for the past 2 months: [place] ?Lives with:: Spouse, Adult Children ?Patient language and need for interpreter reviewed:: Yes ?Do you feel safe going back to the place where you live?: Yes      ?Need for Family Participation in Patient Care: Yes (Comment) ?Care giver support system in place?: Yes (comment) ?Current home services: DME ?Criminal Activity/Legal Involvement Pertinent to Current Situation/Hospitalization: No - Comment as needed ? ?Activities of Daily  Living ?Home Assistive Devices/Equipment: Grab bars in shower, Grab bars around toilet, Shower chair without back, Walker (specify type), Cane (specify quad or straight), Wheelchair ?ADL Screening (condition at time of admission) ?Patient's cognitive ability adequate to safely complete daily activities?: Yes ?Is the patient deaf or have difficulty hearing?: Yes ?Does the patient have difficulty seeing, even when wearing glasses/contacts?: No ?Does the patient have difficulty concentrating, remembering, or making decisions?: Yes ?Patient able to express need for assistance with ADLs?: Yes ?Does the patient have difficulty dressing or bathing?: No ?Independently performs ADLs?: No ?Communication: Independent ?Dressing (OT): Needs assistance ?Is this a change from baseline?: Pre-admission baseline ?Grooming: Needs assistance ?Is this a change from baseline?: Pre-admission baseline ?Feeding: Independent ?Bathing: Needs assistance ?Is this a change from baseline?: Pre-admission baseline ?Toileting: Needs assistance ?Is this a change from baseline?: Pre-admission baseline ?In/Out Bed: Independent with device (comment) ?Is this a change from baseline?: Pre-admission baseline ?Walks in Home: Independent with device (comment) ?Does the patient have difficulty walking or climbing stairs?: Yes ?Weakness of Legs: Both ?Weakness of Arms/Hands: None ? ?Permission Sought/Granted ?Permission sought to share information with : Customer service manager ?Permission granted to share information with : Yes, Verbal Permission Granted (by wife) ?   ?   ?   ?   ? ?Emotional Assessment ?  ?  ?  ?Orientation: : Fluctuating Orientation (Suspected and/or reported [place]) ?Alcohol /  Substance Use: Not Applicable ?Psych Involvement: No (comment) ? ?Admission diagnosis:  Subdural hematoma ([place]) [S06.5XAA] ?Fall, initial encounter [W19.XXXA] ?Patient Active Problem List  ? Diagnosis Date Noted  ? Subdural hematoma ([place]) 05/13/2021  ?  Fall at home, initial encounter 05/13/2021  ? Hypothyroidism 05/12/2021  ? Coronary artery disease 05/12/2021  ? BPH (benign prostatic hyperplasia) 05/12/2021  ? Chronic cholecystitis 11/21/2019  ? Acute cholecystitis 10/01/2019  ? CHF (congestive heart failure) ([place]) 09/23/2017  ? AAA (abdominal aortic aneurysm) without rupture 02/15/2016  ? Palpitations 11/28/2015  ? Dyspnea 11/28/2015  ? Chronic systolic heart failure ([place]) 04/23/2015  ? HTN (hypertension) 04/23/2015  ? Diabetes ([place]) 04/23/2015  ? Sick sinus syndrome ([place]) 07/03/2014  ? Atrial fibrillation ([place]) 06/17/2014  ? S/p TAVR (transcatheter aortic valve replacement), bioprosthetic 04/14/2014  ? MI (myocardial infarction) ([place]) 09/30/2013  ? ?PCP:  [name], MD ?Pharmacy:   ?[facility] [place] [address] ?[address] ?[place] [address] ?Phone: [phone] Fax: [phone] ? ? ? ? ?Social Determinants of Health (SDOH) Interventions ?  ? ?Readmission Risk Interventions ? ?  05/14/2021  ? 10:32 AM  ?Readmission Risk Prevention Plan  ?Transportation Screening Complete  ?PCP or Specialist Appt within 5-7 Days Complete  ?Home Care Screening Complete  ?Medication Review (RN CM) Complete  ? ? ? ?

## 2021-05-14 NOTE — Progress Notes (Signed)
Attending Progress Note ? ?History: Derrick Cochran is here for a history of ICH after a fall. He has baseline dementia but was GCS14 on arrival and is covid positive.  ? ?Subjective/Interval Events: No acute events overnight. AM head CT was stable to improved.  No headache this morning. ? ?Physical Exam: ?Vitals:  ? 05/14/21 0559 05/14/21 0821  ?BP: (!) 143/56 (!) 132/59  ?Pulse: 71 72  ?Resp:  19  ?Temp: (!) 97.5 ?F (36.4 ?C) 97.8 ?F (36.6 ?C)  ?SpO2: 100% 93%  ? ? ?AA Ox2 which is his baseline, ?CNI with the exception of hearing difficulty which she states is chronic ?No gross deficits noted.  Difficulty following along with exam due to hearing/cognition. ? ?Data: ? ?Recent Labs  ?Lab 05/12/21 ?1612 05/14/21 ?0635  ?NA 140 141  ?K 4.1 4.2  ?CL 104 106  ?CO2 24 24  ?BUN 33* 29*  ?CREATININE 1.22 1.10  ?GLUCOSE 154* 121*  ?CALCIUM 8.5* 8.4*  ? ?Recent Labs  ?Lab 05/12/21 ?1617  ?AST 25  ?ALT 10  ?ALKPHOS 49  ?  ? Recent Labs  ?Lab 05/12/21 ?1612 05/12/21 ?2637 05/14/21 ?8588  ?WBC 5.0 3.1* 4.0  ?HGB 13.1 11.2* 11.3*  ?HCT 40.4 34.1* 34.6*  ?PLT 67* 60* 88*  ? ?Recent Labs  ?Lab 05/12/21 ?1740 05/12/21 ?1944 05/13/21 ?5027 05/14/21 ?7412  ?APTT 58*  --   --   --   ?INR 3.9* 1.4* 1.2 1.2  ?  ?   ? ? ?Other tests/results: ?Narrative & Impression  ?CLINICAL DATA:  86 year old male status post fall with small volume ?para falcine subdural hematoma on 05/12/2021. ?  ?EXAM: ?CT HEAD WITHOUT CONTRAST ?  ?TECHNIQUE: ?Contiguous axial images were obtained from the base of the skull ?through the vertex without intravenous contrast. ?  ?RADIATION DOSE REDUCTION: This exam was performed according to the ?departmental dose-optimization program which includes automated ?exposure control, adjustment of the mA and/or kV according to ?patient size and/or use of iterative reconstruction technique. ?  ?COMPARISON:  Head CT 05/12/2021 and earlier. ?  ?FINDINGS: ?Brain: Regression of small volume hyperdense para falcine subdural ?blood  over the past 2 days (series 2, image 28) with trace residual ?now. ?  ?Small volume intraventricular blood has also decreased, with ?residual lobular hemorrhage near the right temporal choroid plexus ?(series 2, image 15) and stable small volume of layering blood in ?the occipital horns. No convincing subarachnoid hemorrhage now. ?  ?No new areas of intracranial hemorrhage. No midline shift, ?ventriculomegaly, mass effect, evidence of mass lesion, or evidence ?of cortically based acute infarction. Stable gray-white matter ?differentiation throughout the brain. 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 ?  ?Vascular: Calcified atherosclerosis at the skull base. ?  ?Skull: Stable and intact. ?  ?Sinuses/Orbits: Stable mild to moderate mostly ethmoid sinus mucosal ?thickening. Tympanic cavities and mastoids remain clear. ?  ?Other: No acute orbit or scalp soft tissue finding. ?  ?IMPRESSION: ?1. Decreased parafalcine subdural blood and intraventricular blood ?since 05/12/2021. Essentially trace hemorrhage volume now. ?  ?2. No intracranial mass effect or new intracranial abnormality. ?  ?  ?Electronically Signed ?  By: Genevie Ann M.D. ?  On: 05/14/2021 06:04  ? ? ?Assessment/Plan: ? ?Derrick Cochran is a pleasant 86 y.o. male with intracranial hemorrhage after a fall.  He has a mild TBI with GCS14 (-1 for disorientation).   ?  ?His CT scan is stable, though he is at risk of worsening due to his thrombocytopenia and history of  therapeutic anticoagulation.  He also has new diagnosis of COVID. ?  ?- Repeat Head CT is stable, repeat if worsening examination ?- Monitor platelets and transfuse for platelets <50k ?- OK to work with PTOT once felt appropriate by primary team ?- Would not give AED due to advanced age and risk for polypharmacy ?- We will continue to follow ?- Call NSGY on call for any change in neurological status ?  ? ?Stevan Born, MD/MSCR ?Department of Neurosurgery ? ? ? ?

## 2021-05-14 NOTE — Assessment & Plan Note (Deleted)
--  per OT, BP in sitting 140/63, after standing for approx 3 minutes 92/81, 117/84 after sitting back down; pt does not report dizziness but feels "tired". ?Plan: ?--gentle IVF with NS'@75'$  for 12 hours ?

## 2021-05-14 NOTE — Progress Notes (Signed)
?PROGRESS NOTE ? ? ? ?[name]  NTZ:[id number] DOB: 08/18/1935 DOA: 05/12/2021 ?PCP: [name], MD  ?120A/120A-AA ? ? ?Assessment & Plan: ?  ?Principal Problem: ?  Fall at home, initial encounter ?Active Problems: ?  Subdural hematoma ([facility]) ?  Atrial fibrillation ([place]) ?  Sick sinus syndrome ([place]) ?  Chronic systolic heart failure ([place]) ?  HTN (hypertension) ?  Diabetes ([place]) ?  Hypothyroidism ?  Coronary artery disease ?  BPH (benign prostatic hyperplasia) ? ? ?Derrick Cochran is a 86 y.o. male with medical history significant of CAD, dementia, pacemaker, HTN, hypothyroidism, Afib on warfarin, who presented after a fall at home.   ?  ?Subdural hematoma: ?Patient found to have subdural hematoma as a result of his fall in the setting of taking warfarin and INR 3.9. ?--was given Kcentra for reversal of supratherapeutic INR and also given vitamin K.  ?--Neurosurgery Dr. Cari Caraway has been consulted by ED physician.  repeat CT head relatively stable ?Plan: ?--repeat CT head today, improved ?- Monitor platelets and transfuse for platelets <50k ?--ok to work with PT ? ?Fall ?--pt has no recollection of the events surrounding the fall ?--OT found pt to be orthostatic, although pt denied dizzness  ?--PT/OT ? ?Orthostasis  ?--per OT, BP in sitting 140/63, after standing for approx 3 minutes 92/81, 117/84 after sitting back down; pt does not report dizziness but feels "tired". ?Plan: ?--gentle IVF with NS'@75'$  for 12 hours ? ?Chronic Atrial fibrillation on home warfarin ?Patient is currently rate controlled  ?--per Dr. Saralyn Pilar, pt was taken off of sotalol. ?Plan: ?--cont Toprol ?--hold warfarin 2/2 brain bleeds ? ?Elevated lactic acid, resolved ?--normalized after IVF ?  ?COVID-positive: ?Patient per history has been having cold symptoms for the past few days.  No infiltrates on chest x-ray no hypoxia.   ?--no need to start covid tx ?  ?Hypertension ?--BP intermittently soft ?--Hold home enalapril ?--cont home  metop ?  ?Diabetes, well controlled ?--hold patient's glipizide and metformin. ?Glycemic protocol. ?  ?Hypothyroidism: ?--cont levothyroxine 150 mcg. ?  ?GERD ?--d/c'ed IV PPI  ?--cont home PPI ? ? ? ?DVT prophylaxis: SCD/Compression stockings ?Code Status: Full code  ?Family Communication:  ?Level of care: Med-Surg ?Dispo:   ?The patient is from: home ?Anticipated d/c is to: home ?Anticipated d/c date is: tomorrow ?Patient currently is not medically ready to d/c due to: orthostasis ? ? ?Subjective and Interval History:  ?Repeat CT head improved. ? ?Pt worked with OT and found to be orthostatic. ? ? ?Objective: ?Vitals:  ? 05/13/21 2106 05/14/21 0559 05/14/21 6759 05/14/21 1701  ?BP: 122/62 (!) 143/56 (!) 132/59 (!) 106/55  ?Pulse: 70 71 72 79  ?Resp: '18  19 19  '$ ?Temp: 97.6 ?F (36.4 ?C) (!) 97.5 ?F (36.4 ?C) 97.8 ?F (36.6 ?C) 98.2 ?F (36.8 ?C)  ?TempSrc: Oral Oral    ?SpO2: 95% 100% 93% 99%  ?Weight:      ?Height:      ? ?No intake or output data in the 24 hours ending 05/14/21 1738 ? ?Filed Weights  ? 05/12/21 1607  ?Weight: 79.4 kg  ? ? ?Examination:  ? ?Constitutional: NAD, alert, sitting in recliner ?HEENT: conjunctivae and lids normal, EOMI ?CV: No cyanosis.   ?RESP: normal respiratory effort, on RA ?SKIN: warm, dry ? ? ?Data Reviewed: I have personally reviewed following labs and imaging studies ? ?CBC: ?Recent Labs  ?Lab 05/12/21 ?1612 05/12/21 ?1638 05/14/21 ?4665  ?WBC 5.0 3.1* 4.0  ?HGB 13.1 11.2*  11.3*  ?HCT 40.4 34.1* 34.6*  ?MCV 100.7* 99.7 99.1  ?PLT 67* 60* 88*  ? ?Basic Metabolic Panel: ?Recent Labs  ?Lab 05/12/21 ?1612 05/14/21 ?0635  ?NA 140 141  ?K 4.1 4.2  ?CL 104 106  ?CO2 24 24  ?GLUCOSE 154* 121*  ?BUN 33* 29*  ?CREATININE 1.22 1.10  ?CALCIUM 8.5* 8.4*  ?MG  --  1.5*  ? ?GFR: ?Estimated Creatinine Clearance: 51.6 mL/min (by C-G formula based on SCr of 1.1 mg/dL). ?Liver Function Tests: ?Recent Labs  ?Lab 05/12/21 ?1617  ?AST 25  ?ALT 10  ?ALKPHOS 49  ?BILITOT 1.6*  ?PROT 7.9  ?ALBUMIN 3.1*   ? ?No results for input(s): LIPASE, AMYLASE in the last 168 hours. ?No results for input(s): AMMONIA in the last 168 hours. ?Coagulation Profile: ?Recent Labs  ?Lab 05/12/21 ?1740 05/12/21 ?1944 05/13/21 ?9476 05/14/21 ?5465  ?INR 3.9* 1.4* 1.2 1.2  ? ?Cardiac Enzymes: ?No results for input(s): CKTOTAL, CKMB, CKMBINDEX, TROPONINI in the last 168 hours. ?BNP (last 3 results) ?No results for input(s): PROBNP in the last 8760 hours. ?HbA1C: ?Recent Labs  ?  05/12/21 ?1607  ?HGBA1C 6.8*  ? ?CBG: ?Recent Labs  ?Lab 05/13/21 ?1210 05/13/21 ?1619 05/14/21 ?0354 05/14/21 ?1212 05/14/21 ?1701  ?GLUCAP 228* 139* 104* 133* 145*  ? ?Lipid Profile: ?No results for input(s): CHOL, HDL, LDLCALC, TRIG, CHOLHDL, LDLDIRECT in the last 72 hours. ?Thyroid Function Tests: ?No results for input(s): TSH, T4TOTAL, FREET4, T3FREE, THYROIDAB in the last 72 hours. ?Anemia Panel: ?No results for input(s): VITAMINB12, FOLATE, FERRITIN, TIBC, IRON, RETICCTPCT in the last 72 hours. ?Sepsis Labs: ?Recent Labs  ?Lab 05/12/21 ?1750 05/12/21 ?1944  ?LATICACIDVEN 3.1* 1.5  ? ? ?Recent Results (from the past 240 hour(s))  ?Resp Panel by RT-PCR (Flu A&B, Covid) Nasopharyngeal Swab     Status: Abnormal  ? Collection Time: 05/12/21  5:41 PM  ? Specimen: Nasopharyngeal Swab; Nasopharyngeal(NP) swabs in vial transport medium  ?Result Value Ref Range Status  ? SARS Coronavirus 2 by RT PCR POSITIVE (A) NEGATIVE Final  ?  Comment: (NOTE) ?SARS-CoV-2 target nucleic acids are DETECTED. ? ?The SARS-CoV-2 RNA is generally detectable in upper respiratory ?specimens during the acute phase of infection. Positive results are ?indicative of the presence of the identified virus, but do not rule ?out bacterial infection or co-infection with other pathogens not ?detected by the test. Clinical correlation with patient history and ?other diagnostic information is necessary to determine patient ?infection status. The expected result is Negative. ? ?Fact Sheet for  Patients: ?EntrepreneurPulse.com.au ? ?Fact Sheet for Healthcare Providers: ?IncredibleEmployment.be ? ?This test is not yet approved or cleared by the [place] FDA and  ?has been authorized for detection and/or diagnosis of SARS-CoV-2 by ?FDA under an Emergency Use Authorization (EUA).  This EUA will ?remain in effect (meaning this test can be used) for the duration of  ?the COVID-19 declaration under Section 564(b)(1) of the A ct, 21 ?U.S.C. section 360bbb-3(b)(1), unless the authorization is ?terminated or revoked sooner. ? ?  ? Influenza A by PCR NEGATIVE NEGATIVE Final  ? Influenza B by PCR NEGATIVE NEGATIVE Final  ?  Comment: (NOTE) ?The Xpert Xpress SARS-CoV-2/FLU/RSV plus assay is intended as an aid ?in the diagnosis of influenza from Nasopharyngeal swab specimens and ?should not be used as a sole basis for treatment. Nasal washings and ?aspirates are unacceptable for Xpert Xpress SARS-CoV-2/FLU/RSV ?testing. ? ?Fact Sheet for Patients: ?EntrepreneurPulse.com.au ? ?Fact Sheet for Healthcare Providers: ?IncredibleEmployment.be ? ?This test is not  yet approved or cleared by the [place] and ?has been authorized for detection and/or diagnosis of SARS-CoV-2 by ?FDA under an Emergency Use Authorization (EUA). This EUA will remain ?in effect (meaning this test can be used) for the duration of the ?COVID-19 declaration under Section 564(b)(1) of the Act, 21 U.S.C. ?section 360bbb-3(b)(1), unless the authorization is terminated or ?revoked. ? ?Performed at [facility], [place], ?[place] [address] ?  ?Blood culture (routine x 2)     Status: None (Preliminary result)  ? Collection Time: 05/12/21  7:44 PM  ? Specimen: BLOOD  ?Result Value Ref Range Status  ? Specimen Description BLOOD RIGHT FOREARM  Final  ? Special Requests   Final  ?  BOTTLES DRAWN AEROBIC AND ANAEROBIC Blood Culture adequate volume  ?  Culture   Final  ?  NO GROWTH 2 DAYS ?Performed at [facility], [address] [place], [place] [address] ?  ? Report Status PENDING  Incomplete  ?Blood culture (routine x 2)     Status: None (Preliminary re

## 2021-05-14 NOTE — Evaluation (Signed)
Physical Therapy Evaluation ?Patient Details ?Name: Derrick Cochran ?MRN: [id number] ?DOB: March 21, 1935 ?Today's Date: 05/14/2021 ? ?History of Present Illness ? Pt is an 86 y/o M admitted on 05/12/21 after presenting with c/c of fall. Pt found to have SDH/SAH/IVH. PMH: CAD, CHF, pacemaker, HTN, hypothyroidism, GERD, a-fib, AAA, memory issues/dementia, AAA, heart murmur, MI  ?Clinical Impression ? Pt seen for PT evaluation with pt received in bed & agreeable to tx. Pt only oriented to self on this date but has hx of worsening cognition, per wife. Pt is able to complete bed mobility with mod I, STS with CGA, & gait in room with RW & min assist with gait pattern as noted below. Pt does not properly use AD during gait & has slight LOB to R. Pt's wife reports he uses rollator at baseline though. Will continue to follow pt acutely to address balance, endurance, strength & gait with LRAD. Recommend HHPT f/u. ?   ? ?Recommendations for follow up therapy are one component of a multi-disciplinary discharge planning process, led by the attending physician.  Recommendations may be updated based on patient status, additional functional criteria and insurance authorization. ? ?Follow Up Recommendations Home health PT ? ?  ?Assistance Recommended at Discharge Frequent or constant Supervision/Assistance  ?Patient can return home with the following ? A little help with walking and/or transfers;A little help with bathing/dressing/bathroom;Assistance with cooking/housework;Direct supervision/assist for financial management;Assist for transportation;Direct supervision/assist for medications management;Help with stairs or ramp for entrance ? ?  ?Equipment Recommendations None recommended by PT  ?Recommendations for Other Services ?    ?  ?Functional Status Assessment Patient has had a recent decline in their functional status and demonstrates the ability to make significant improvements in function in a reasonable and predictable amount of  time.  ? ?  ?Precautions / Restrictions Precautions ?Precautions: Fall ?Restrictions ?Weight Bearing Restrictions: No  ? ?  ? ?Mobility ? Bed Mobility ?Overal bed mobility: Modified Independent ?  ?  ?  ?  ?  ?  ?General bed mobility comments: supine>sit with HOB elevated ?  ? ?Transfers ?Overall transfer level: Needs assistance ?Equipment used: Rolling walker (2 wheels) ?Transfers: Sit to/from Stand ?Sit to Stand: Min guard ?  ?  ?  ?  ?  ?General transfer comment: cuing for safe hand placement to push up to standing ?  ? ?Ambulation/Gait ?Ambulation/Gait assistance: Min assist ?Gait Distance (Feet): 35 Feet ?Assistive device: Rolling walker (2 wheels) ?Gait Pattern/deviations: Decreased step length - right, Decreased step length - left, Decreased stride length, Decreased dorsiflexion - right, Decreased dorsiflexion - left, Trunk flexed ?Gait velocity: decreased ?  ?  ?General Gait Details: Slight LOB to R at times with min assist to correct, kyphosis, pushes RW out in front of him & doesn't ambulate within base of AD when turning. ? ?Stairs ?  ?  ?  ?  ?  ? ?Wheelchair Mobility ?  ? ?Modified Rankin (Stroke Patients Only) ?  ? ?  ? ?Balance Overall balance assessment: Needs assistance ?Sitting-balance support: Feet supported ?Sitting balance-Leahy Scale: Good ?  ?  ?Standing balance support: During functional activity, Bilateral upper extremity supported ?Standing balance-Leahy Scale: Poor ?  ?  ?  ?  ?  ?  ?  ?  ?  ?  ?  ?  ?   ? ? ? ?Pertinent Vitals/Pain Pain Assessment ?Pain Assessment: No/denies pain  ? ? ?Home Living Family/patient expects to be discharged to:: Private residence ?Living Arrangements: Spouse/significant other ?  Available Help at Discharge: Family;Available 24 hours/day (lives with wife, son can assist) ?Type of Home: Mobile home ?Home Access: Ramped entrance ?  ?  ?  ?Home Layout: One level ?Home Equipment: Rollator (4 wheels);Shower seat ?   ?  ?Prior Function   ?  ?  ?  ?  ?  ?  ?Mobility  Comments: Pt was ambulatory with rollator with wife providing supervision but when unsteady would sit on rollator & roll around. 2-3 falls prior to admission. ?  ?  ? ? ?Hand Dominance  ?   ? ?  ?Extremity/Trunk Assessment  ? Upper Extremity Assessment ?Upper Extremity Assessment: Overall WFL for tasks assessed ?  ? ?  ?  ? ?Cervical / Trunk Assessment ?Cervical / Trunk Assessment: Kyphotic  ?Communication  ? Communication: No difficulties  ?Cognition Arousal/Alertness: Awake/alert ?Behavior During Therapy: [facility] for tasks assessed/performed ?Overall Cognitive Status: History of cognitive impairments - at baseline ?  ?  ?  ?  ?  ?  ?  ?  ?  ?  ?  ?  ?  ?  ?  ?  ?General Comments: Pt's wife reports pt has been more forgetful in the recent past but is f/u with PCP regarding this. Pt only oriented to self & age on this date, unable to state what city or [place]idling he's in, unable to recall current year. ?  ?  ? ?  ?General Comments   ? ?  ?Exercises    ? ?Assessment/Plan  ?  ?PT Assessment Patient needs continued PT services  ?PT Problem List Decreased strength;Decreased mobility;Decreased safety awareness;Decreased coordination;Decreased activity tolerance;Decreased cognition;Cardiopulmonary status limiting activity;Decreased balance;Decreased knowledge of use of DME ? ?   ?  ?PT Treatment Interventions DME instruction;Therapeutic exercise;Balance training;Gait training;Stair training;Neuromuscular re-education;Functional mobility training;Cognitive remediation;Therapeutic activities;Patient/family education   ? ?PT Goals (Current goals can be found in the Care Plan section)  ?Acute Rehab PT Goals ?Patient Stated Goal: get better ?PT Goal Formulation: With patient ?Time For Goal Achievement: 05/28/21 ?Potential to Achieve Goals: Good ? ?  ?Frequency 7X/week ?  ? ? ?Co-evaluation   ?  ?  ?  ?  ? ? ?  ?AM-PAC PT "6 Clicks" Mobility  ?Outcome Measure Help needed turning from your back to your side while in a flat bed without  using bedrails?: None ?Help needed moving from lying on your back to sitting on the side of a flat bed without using bedrails?: None ?Help needed moving to and from a bed to a chair (including a wheelchair)?: A Little ?Help needed standing up from a chair using your arms (e.g., wheelchair or bedside chair)?: A Little ?Help needed to walk in hospital room?: A Little ?Help needed climbing 3-5 steps with a railing? : A Little ?6 Click Score: 20 ? ?  ?End of Session   ?Activity Tolerance: Patient tolerated treatment well (pt declined additional walking 2/2 not liking RW) ?Patient left: in chair;with chair alarm set;with call bell/phone within reach ?Nurse Communication: Mobility status ?PT Visit Diagnosis: Unsteadiness on feet (R26.81);Muscle weakness (generalized) (M62.81);Difficulty in walking, not elsewhere classified (R26.2) ?  ? ?Time: 9735-3299 ?PT Time Calculation (min) (ACUTE ONLY): 18 min ? ? ?Charges:   PT Evaluation ?$PT Eval High Complexity: 1 High ?  ?  ?   ? ? ?Lavone Nian, PT, DPT ?05/14/21, 10:16 AM ? ? ?Waunita Schooner ?05/14/2021, 10:14 AM ? ?

## 2021-05-14 NOTE — ED Notes (Addendum)
One of fingers to pt's R hand red, swollen, and hot. Pt able to move finger but not well due to swelling. Provider Billie Ruddy notified. Pt ate about 50 percent of food from lunch tray.  ?

## 2021-05-14 NOTE — Plan of Care (Signed)
  Problem: Health Behavior/Discharge Planning: Goal: Ability to manage health-related needs will improve Outcome: Progressing   

## 2021-05-15 DIAGNOSIS — I10 Essential (primary) hypertension: Secondary | ICD-10-CM

## 2021-05-15 LAB — CBC
HCT: 32.3 % — ABNORMAL LOW (ref 39.0–52.0)
Hemoglobin: 10.9 g/dL — ABNORMAL LOW (ref 13.0–17.0)
MCH: 33 pg (ref 26.0–34.0)
MCHC: 33.7 g/dL (ref 30.0–36.0)
MCV: 97.9 fL (ref 80.0–100.0)
Platelets: 86 10*3/uL — ABNORMAL LOW (ref 150–400)
RBC: 3.3 MIL/uL — ABNORMAL LOW (ref 4.22–5.81)
RDW: 12.7 % (ref 11.5–15.5)
WBC: 3.8 10*3/uL — ABNORMAL LOW (ref 4.0–10.5)
nRBC: 0 % (ref 0.0–0.2)

## 2021-05-15 LAB — BASIC METABOLIC PANEL
Anion gap: 7 (ref 5–15)
BUN: 29 mg/dL — ABNORMAL HIGH (ref 8–23)
CO2: 25 mmol/L (ref 22–32)
Calcium: 8.4 mg/dL — ABNORMAL LOW (ref 8.9–10.3)
Chloride: 106 mmol/L (ref 98–111)
Creatinine, Ser: 1.05 mg/dL (ref 0.61–1.24)
GFR, Estimated: >60 mL/min (ref >60)
Glucose, Bld: 127 mg/dL — ABNORMAL HIGH (ref 70–99)
Potassium: 4.5 mmol/L (ref 3.5–5.1)
Sodium: 138 mmol/L (ref 135–145)

## 2021-05-15 LAB — MAGNESIUM: Magnesium: 1.6 mg/dL — ABNORMAL LOW (ref 1.7–2.4)

## 2021-05-15 MED ORDER — WARFARIN SODIUM 1 MG PO TABS
ORAL_TABLET | ORAL | Status: AC
Start: 2021-05-15 — End: ?

## 2021-05-15 MED ORDER — WARFARIN SODIUM 3 MG PO TABS: ORAL_TABLET | ORAL | Status: AC

## 2021-05-15 NOTE — Progress Notes (Signed)
?   05/15/21 1610 05/15/21 9604 05/15/21 0845  ?Orthostatic Lying   ?BP- Lying 126/51  --   --   ?Pulse- Lying 62  --   --   ?Orthostatic Sitting  ?BP- Sitting  --  119/57  --   ?Pulse- Sitting  --  79  --   ?Orthostatic Standing at 0 minutes  ?BP- Standing at 0 minutes  --   --  114/64  ? ? ?

## 2021-05-15 NOTE — Discharge Summary (Signed)
? ?Physician Discharge Summary ? ? ?Derrick Cochran  male DOB: 1935/08/22  ?NWG:[id number] ? ?PCP: [name], MD ? ?Admit date: 05/12/2021 ?Discharge date: 05/15/2021 ? ?Admitted From: home ?Disposition:  home ?Discussed with wife about discharge plans prior to discharge. ? ?Home Health: Yes ?CODE STATUS: Full code ? ?Discharge Instructions   ? ? Discharge instructions   Complete by: As directed ?  ? The bleeding in your brain from falling while on blood thinner has improved.  Please hold warfarin until outpatient followup with cardiology. ? ?Your blood pressure was soft and it dropped when you stood up.  You received some IV fluid with improvement.  Your Enalapril and lasix are discontinued. ? ? ?Dr. Enzo Bi ?- ?-  ? ?  ? ?Hospital Course:  ?For full details, please see H&P, progress notes, consult notes and ancillary notes.  ?Briefly,  ?Derrick Cochran is a 86 y.o. male with medical history significant of CAD, dementia, pacemaker, HTN, hypothyroidism, Afib on warfarin, who presented after a fall at home.   ?  ?Subdural hematoma: ?Patient found to have subdural hematoma as a result of his fall in the setting of taking warfarin and INR 3.9. ?--was given Kcentra for reversal of supratherapeutic INR and also given vitamin K.  ?--Neurosurgery Dr. Cari Caraway has been consulted by ED physician.  repeat CT head x2 stable and improved. ?--Home warfarin held until outpatient f/u with cardiology.  ?  ?Fall ?--pt has no recollection of the events surrounding the fall ?--OT initially found pt to be orthostatic, although pt denied dizzness.  After gentle IVF, orthostasis resolved the next day. ?--PT/OT cleared pt to go home with [facility] ?  ?Orthostasis, resolved ?--per OT, BP in sitting 140/63, after standing for approx 3 minutes 92/81, 117/84 after sitting back down; pt does not report dizziness but feels "tired". ?After gentle IVF, orthostasis resolved the next day. ?  ?Chronic Atrial fibrillation on home warfarin ?Patient  currently rate controlled  ?--per Dr. Saralyn Pilar, pt was taken off of sotalol. ?--cont Toprol ?--hold warfarin 2/2 brain bleeds ? ?Elevated lactic acid, resolved ?--normalized after IVF ?  ?COVID-positive: ?Patient per history has been having cold symptoms for the past few days.  No infiltrates on chest x-ray no hypoxia.   ?--no need to start covid tx ?  ?Hypertension ?--BP intermittently soft ?--home enalapril and lasix d/c'ed. ?--cont home metop ?  ?Diabetes, well controlled ?--patient's metformin held while inpatient, resumed after discharge. ?--Pt was not taking glipizide PTA.  ?  ?Hypothyroidism: ?--cont levothyroxine 150 mcg. ?  ?GERD ?--cont home PPI ?  ? ?Unless noted above, medications under "STOP" med list are ones pt wasn't taking PTA. ? ? ?Discharge Diagnoses:  ?Principal Problem: ?  Fall at home, initial encounter ?Active Problems: ?  Subdural hematoma ([facility]) ?  Atrial fibrillation ([place]) ?  Sick sinus syndrome ([place]) ?  Chronic systolic heart failure ([place]) ?  HTN (hypertension) ?  Diabetes ([place]) ?  Hypothyroidism ?  Coronary artery disease ?  BPH (benign prostatic hyperplasia) ?  Orthostasis ? ? ?30 Day Unplanned Readmission Risk Score   ? ?Flowsheet Row ED to Hosp-Admission (Current) from 05/12/2021 in [place]  ?30 Day Unplanned Readmission Risk Score (%) 20.02 Filed at 05/15/2021 0801  ? ?  ? ? This score is the patient's risk of an unplanned readmission within 30 days of being discharged (0 -100%). The score is based on dignosis, age, lab data, medications, orders, and past  utilization.   ?Low:  0-14.9   Medium: 15-21.9   High: 22-29.9   Extreme: 30 and above ? ?  ? ?  ? ? ?Discharge Instructions: ? ?Allergies as of 05/15/2021   ? ?   Reactions  ? Adhesive [tape] Rash  ? Ok with Tegaderm   ? Cefdinir Other (See Comments)  ? nightmares  ? Sulfa Antibiotics Rash  ? Sulfamethoxazole Hives, Itching, Rash  ? ?  ? ?  ?Medication List  ?  ? ?STOP taking these  medications   ? ?azithromycin 250 MG tablet ?Commonly known as: ZITHROMAX ?  ?enalapril 5 MG tablet ?Commonly known as: VASOTEC ?  ?furosemide 20 MG tablet ?Commonly known as: LASIX ?  ?gabapentin 300 MG capsule ?Commonly known as: NEURONTIN ?  ?glipiZIDE 5 MG 24 hr tablet ?Commonly known as: GLUCOTROL XL ?  ?HYDROcodone-acetaminophen 5-325 MG tablet ?Commonly known as: Norco ?  ?hydrocortisone 25 MG suppository ?Commonly known as: ANUSOL-HC ?  ? ?  ? ?TAKE these medications   ? ?acetaminophen 500 MG tablet ?Commonly known as: TYLENOL ?Take 500 mg by mouth every 6 (six) hours as needed for mild pain, moderate pain or fever. ?  ?DULoxetine 20 MG capsule ?Commonly known as: CYMBALTA ?Take 20 mg by mouth daily. ?  ?levocetirizine 5 MG tablet ?Commonly known as: XYZAL ?Take 5 mg by mouth every evening. ?  ?levothyroxine 100 MCG tablet ?Commonly known as: SYNTHROID ?Take 100 mcg by mouth daily. ?What changed: Another medication with the same name was removed. Continue taking this medication, and follow the directions you see here. ?  ?metFORMIN 500 MG tablet ?Commonly known as: GLUCOPHAGE ?Take 500 mg by mouth 2 (two) times daily. ?  ?metoprolol succinate 50 MG 24 hr tablet ?Commonly known as: TOPROL-XL ?Take 50 mg by mouth daily. ?  ?nitroGLYCERIN 0.4 MG SL tablet ?Commonly known as: NITROSTAT ?Place 0.4 mg under the tongue every 5 (five) minutes as needed for chest pain. ?  ?omeprazole 20 MG capsule ?Commonly known as: PRILOSEC ?Take 20 mg by mouth daily. ?  ?simvastatin 40 MG tablet ?Commonly known as: ZOCOR ?Take 40 mg by mouth every evening. ?  ?tamsulosin 0.4 MG Caps capsule ?Commonly known as: FLOMAX ?Take 0.4 mg by mouth every morning. ?  ?temazepam 30 MG capsule ?Commonly known as: RESTORIL ?Take 30-60 mg by mouth at bedtime as needed for sleep. ?  ?tiZANidine 2 MG tablet ?Commonly known as: ZANAFLEX ?Take 1 tablet by mouth 2 (two) times daily. ?  ?vitamin B-12 1000 MCG tablet ?Commonly known as:  CYANOCOBALAMIN ?Take 1,000 mcg by mouth daily. ?  ?warfarin 3 MG tablet ?Commonly known as: COUMADIN ?Hold until followup with outpatient cardiology due to brain bleed. ?What changed:  ?how much to take ?how to take this ?when to take this ?additional instructions ?  ?warfarin 1 MG tablet ?Commonly known as: COUMADIN ?Hold until followup with outpatient cardiology due to brain bleed. ?What changed:  ?how much to take ?how to take this ?when to take this ?additional instructions ?  ? ?  ? ? ? Follow-up Information   ? ? [name], MD Follow up in 1 week(s).   ?Specialty: Internal Medicine ?Contact information: ?[place] [address] ?[phone] ? ? ?  ?  ? ?  ?  ? ?  ? ? ?Allergies  ?Allergen Reactions  ? Adhesive [Tape] Rash  ?  Ok with Tegaderm   ? Cefdinir Other (See Comments)  ?  nightmares  ?  Sulfa Antibiotics Rash  ? Sulfamethoxazole Hives, Itching and Rash  ? ? ? ?The results of significant diagnostics from this hospitalization (including imaging, microbiology, ancillary and laboratory) are listed below for reference.  ? ?Consultations: ? ? ?Procedures/Studies: ?CT HEAD WO CONTRAST (5MM) ? ?Result Date: 05/14/2021 ?CLINICAL DATA:  86 year old male status post fall with small volume para falcine subdural hematoma on 05/12/2021. EXAM: CT HEAD WITHOUT CONTRAST TECHNIQUE: Contiguous axial images were obtained from the base of the skull through the vertex without intravenous contrast. RADIATION DOSE REDUCTION: This exam was performed according to the departmental dose-optimization program which includes automated exposure control, adjustment of the mA and/or kV according to patient size and/or use of iterative reconstruction technique. COMPARISON:  Head CT 05/12/2021 and earlier. FINDINGS: Brain: Regression of small volume hyperdense para falcine subdural blood over the past 2 days (series 2, image 28) with trace residual now. Small volume intraventricular blood has also decreased, with  residual lobular hemorrhage near the right temporal choroid plexus (series 2, image 15) and stable small volume of layering blood in the occipital horns. No convincing subarachnoid hemorrhage now. No new areas of intracranial

## 2021-05-16 ENCOUNTER — Other Ambulatory Visit (HOSPITAL_COMMUNITY): Payer: Self-pay | Admitting: Neurosurgery

## 2021-05-16 ENCOUNTER — Other Ambulatory Visit: Payer: Self-pay | Admitting: Neurosurgery

## 2021-05-16 DIAGNOSIS — I629 Nontraumatic intracranial hemorrhage, unspecified: Secondary | ICD-10-CM

## 2021-05-17 LAB — CULTURE, BLOOD (ROUTINE X 2)
Culture: NO GROWTH
Culture: NO GROWTH
Special Requests: ADEQUATE
Special Requests: ADEQUATE

## 2021-05-19 DIAGNOSIS — I480 Paroxysmal atrial fibrillation: Secondary | ICD-10-CM | POA: Diagnosis not present

## 2021-05-19 DIAGNOSIS — I4821 Permanent atrial fibrillation: Secondary | ICD-10-CM | POA: Diagnosis not present

## 2021-05-19 DIAGNOSIS — Z951 Presence of aortocoronary bypass graft: Secondary | ICD-10-CM | POA: Diagnosis not present

## 2021-05-19 DIAGNOSIS — Z9889 Other specified postprocedural states: Secondary | ICD-10-CM | POA: Diagnosis not present

## 2021-05-19 DIAGNOSIS — I10 Essential (primary) hypertension: Secondary | ICD-10-CM | POA: Diagnosis not present

## 2021-05-19 DIAGNOSIS — Z09 Encounter for follow-up examination after completed treatment for conditions other than malignant neoplasm: Secondary | ICD-10-CM | POA: Diagnosis not present

## 2021-05-19 DIAGNOSIS — I482 Chronic atrial fibrillation, unspecified: Secondary | ICD-10-CM | POA: Diagnosis not present

## 2021-05-19 DIAGNOSIS — I350 Nonrheumatic aortic (valve) stenosis: Secondary | ICD-10-CM | POA: Diagnosis not present

## 2021-05-19 DIAGNOSIS — I2119 ST elevation (STEMI) myocardial infarction involving other coronary artery of inferior wall: Secondary | ICD-10-CM | POA: Diagnosis not present

## 2021-05-19 DIAGNOSIS — E118 Type 2 diabetes mellitus with unspecified complications: Secondary | ICD-10-CM | POA: Diagnosis not present

## 2021-05-19 DIAGNOSIS — S065XAA Traumatic subdural hemorrhage with loss of consciousness status unknown, initial encounter: Secondary | ICD-10-CM | POA: Diagnosis not present

## 2021-05-19 DIAGNOSIS — I5022 Chronic systolic (congestive) heart failure: Secondary | ICD-10-CM | POA: Diagnosis not present

## 2021-05-31 DIAGNOSIS — Z79899 Other long term (current) drug therapy: Secondary | ICD-10-CM | POA: Diagnosis not present

## 2021-05-31 DIAGNOSIS — I480 Paroxysmal atrial fibrillation: Secondary | ICD-10-CM | POA: Diagnosis not present

## 2021-05-31 DIAGNOSIS — E039 Hypothyroidism, unspecified: Secondary | ICD-10-CM | POA: Diagnosis not present

## 2021-05-31 DIAGNOSIS — E118 Type 2 diabetes mellitus with unspecified complications: Secondary | ICD-10-CM | POA: Diagnosis not present

## 2021-05-31 DIAGNOSIS — E782 Mixed hyperlipidemia: Secondary | ICD-10-CM | POA: Diagnosis not present

## 2021-06-07 DIAGNOSIS — Z953 Presence of xenogenic heart valve: Secondary | ICD-10-CM | POA: Diagnosis not present

## 2021-06-07 DIAGNOSIS — I482 Chronic atrial fibrillation, unspecified: Secondary | ICD-10-CM | POA: Diagnosis not present

## 2021-06-07 DIAGNOSIS — Z87891 Personal history of nicotine dependence: Secondary | ICD-10-CM | POA: Diagnosis not present

## 2021-06-07 DIAGNOSIS — I110 Hypertensive heart disease with heart failure: Secondary | ICD-10-CM | POA: Diagnosis not present

## 2021-06-07 DIAGNOSIS — I5022 Chronic systolic (congestive) heart failure: Secondary | ICD-10-CM | POA: Diagnosis not present

## 2021-06-21 DIAGNOSIS — I495 Sick sinus syndrome: Secondary | ICD-10-CM | POA: Diagnosis not present

## 2021-06-27 ENCOUNTER — Ambulatory Visit
Admission: RE | Admit: 2021-06-27 | Discharge: 2021-06-27 | Disposition: A | Discharge status: Home / Self Care | Payer: Medicare HMO | Source: Ambulatory Visit | Attending: Neurosurgery | Admitting: Neurosurgery

## 2021-06-27 DIAGNOSIS — Z515 Encounter for palliative care: Secondary | ICD-10-CM | POA: Diagnosis not present

## 2021-06-27 DIAGNOSIS — Z66 Do not resuscitate: Secondary | ICD-10-CM | POA: Diagnosis not present

## 2021-06-27 DIAGNOSIS — E039 Hypothyroidism, unspecified: Secondary | ICD-10-CM | POA: Diagnosis present

## 2021-06-27 DIAGNOSIS — N179 Acute kidney failure, unspecified: Secondary | ICD-10-CM | POA: Diagnosis not present

## 2021-06-27 DIAGNOSIS — E43 Unspecified severe protein-calorie malnutrition: Secondary | ICD-10-CM | POA: Diagnosis not present

## 2021-06-27 DIAGNOSIS — I130 Hypertensive heart and chronic kidney disease with heart failure and stage 1 through stage 4 chronic kidney disease, or unspecified chronic kidney disease: Secondary | ICD-10-CM | POA: Diagnosis present

## 2021-06-27 DIAGNOSIS — J9601 Acute respiratory failure with hypoxia: Secondary | ICD-10-CM | POA: Diagnosis not present

## 2021-06-27 DIAGNOSIS — F32A Depression, unspecified: Secondary | ICD-10-CM | POA: Diagnosis present

## 2021-06-27 DIAGNOSIS — R739 Hyperglycemia, unspecified: Secondary | ICD-10-CM | POA: Diagnosis not present

## 2021-06-27 DIAGNOSIS — Z681 Body mass index (BMI) 19 or less, adult: Secondary | ICD-10-CM | POA: Diagnosis not present

## 2021-06-27 DIAGNOSIS — Z8616 Personal history of COVID-19: Secondary | ICD-10-CM | POA: Diagnosis not present

## 2021-06-27 DIAGNOSIS — I959 Hypotension, unspecified: Secondary | ICD-10-CM | POA: Diagnosis present

## 2021-06-27 DIAGNOSIS — M25552 Pain in left hip: Secondary | ICD-10-CM | POA: Diagnosis not present

## 2021-06-27 DIAGNOSIS — E1122 Type 2 diabetes mellitus with diabetic chronic kidney disease: Secondary | ICD-10-CM | POA: Diagnosis present

## 2021-06-27 DIAGNOSIS — R918 Other nonspecific abnormal finding of lung field: Secondary | ICD-10-CM | POA: Diagnosis not present

## 2021-06-27 DIAGNOSIS — S022XXD Fracture of nasal bones, subsequent encounter for fracture with routine healing: Secondary | ICD-10-CM | POA: Diagnosis not present

## 2021-06-27 DIAGNOSIS — I5023 Acute on chronic systolic (congestive) heart failure: Secondary | ICD-10-CM | POA: Diagnosis not present

## 2021-06-27 DIAGNOSIS — I629 Nontraumatic intracranial hemorrhage, unspecified: Secondary | ICD-10-CM | POA: Insufficient documentation

## 2021-06-27 DIAGNOSIS — R627 Adult failure to thrive: Secondary | ICD-10-CM | POA: Diagnosis present

## 2021-06-27 DIAGNOSIS — Z953 Presence of xenogenic heart valve: Secondary | ICD-10-CM | POA: Diagnosis not present

## 2021-06-27 DIAGNOSIS — R0902 Hypoxemia: Secondary | ICD-10-CM | POA: Diagnosis not present

## 2021-06-27 DIAGNOSIS — N189 Chronic kidney disease, unspecified: Secondary | ICD-10-CM | POA: Diagnosis present

## 2021-06-27 DIAGNOSIS — G9389 Other specified disorders of brain: Secondary | ICD-10-CM | POA: Diagnosis present

## 2021-06-27 DIAGNOSIS — I5022 Chronic systolic (congestive) heart failure: Secondary | ICD-10-CM | POA: Diagnosis not present

## 2021-06-27 DIAGNOSIS — R9431 Abnormal electrocardiogram [ECG] [EKG]: Secondary | ICD-10-CM | POA: Diagnosis not present

## 2021-06-27 DIAGNOSIS — D696 Thrombocytopenia, unspecified: Secondary | ICD-10-CM | POA: Diagnosis present

## 2021-06-27 DIAGNOSIS — S022XXB Fracture of nasal bones, initial encounter for open fracture: Secondary | ICD-10-CM | POA: Diagnosis present

## 2021-06-27 DIAGNOSIS — Z23 Encounter for immunization: Secondary | ICD-10-CM | POA: Diagnosis not present

## 2021-06-27 DIAGNOSIS — U071 COVID-19: Secondary | ICD-10-CM | POA: Diagnosis not present

## 2021-06-27 DIAGNOSIS — I482 Chronic atrial fibrillation, unspecified: Secondary | ICD-10-CM | POA: Diagnosis not present

## 2021-06-27 DIAGNOSIS — G319 Degenerative disease of nervous system, unspecified: Secondary | ICD-10-CM | POA: Diagnosis not present

## 2021-06-27 DIAGNOSIS — I25810 Atherosclerosis of coronary artery bypass graft(s) without angina pectoris: Secondary | ICD-10-CM | POA: Diagnosis not present

## 2021-06-27 DIAGNOSIS — I619 Nontraumatic intracerebral hemorrhage, unspecified: Secondary | ICD-10-CM | POA: Diagnosis not present

## 2021-06-27 DIAGNOSIS — F03918 Unspecified dementia, unspecified severity, with other behavioral disturbance: Secondary | ICD-10-CM | POA: Diagnosis not present

## 2021-06-27 DIAGNOSIS — M47812 Spondylosis without myelopathy or radiculopathy, cervical region: Secondary | ICD-10-CM | POA: Diagnosis not present

## 2021-06-27 DIAGNOSIS — W19XXXA Unspecified fall, initial encounter: Secondary | ICD-10-CM | POA: Diagnosis not present

## 2021-06-27 DIAGNOSIS — I714 Abdominal aortic aneurysm, without rupture, unspecified: Secondary | ICD-10-CM | POA: Diagnosis present

## 2021-06-27 DIAGNOSIS — J189 Pneumonia, unspecified organism: Secondary | ICD-10-CM | POA: Diagnosis not present

## 2021-06-27 DIAGNOSIS — R64 Cachexia: Secondary | ICD-10-CM | POA: Diagnosis present

## 2021-06-27 DIAGNOSIS — S022XXA Fracture of nasal bones, initial encounter for closed fracture: Secondary | ICD-10-CM | POA: Diagnosis not present

## 2021-06-27 DIAGNOSIS — J9811 Atelectasis: Secondary | ICD-10-CM | POA: Diagnosis not present

## 2021-06-29 ENCOUNTER — Observation Stay: Payer: Medicare HMO

## 2021-06-29 ENCOUNTER — Inpatient Hospital Stay
Admission: EM | Admit: 2021-06-29 | Discharge: 2021-07-14 | DRG: 682 | Disposition: E | Payer: Medicare HMO | Attending: Internal Medicine | Admitting: Internal Medicine

## 2021-06-29 ENCOUNTER — Emergency Department: Payer: Medicare HMO

## 2021-06-29 DIAGNOSIS — E860 Dehydration: Secondary | ICD-10-CM | POA: Diagnosis present

## 2021-06-29 DIAGNOSIS — D696 Thrombocytopenia, unspecified: Secondary | ICD-10-CM | POA: Diagnosis present

## 2021-06-29 DIAGNOSIS — F03918 Unspecified dementia, unspecified severity, with other behavioral disturbance: Secondary | ICD-10-CM | POA: Diagnosis present

## 2021-06-29 DIAGNOSIS — I959 Hypotension, unspecified: Secondary | ICD-10-CM | POA: Diagnosis present

## 2021-06-29 DIAGNOSIS — M25552 Pain in left hip: Secondary | ICD-10-CM | POA: Diagnosis not present

## 2021-06-29 DIAGNOSIS — Z8249 Family history of ischemic heart disease and other diseases of the circulatory system: Secondary | ICD-10-CM

## 2021-06-29 DIAGNOSIS — Z681 Body mass index (BMI) 19 or less, adult: Secondary | ICD-10-CM

## 2021-06-29 DIAGNOSIS — E43 Unspecified severe protein-calorie malnutrition: Secondary | ICD-10-CM | POA: Diagnosis present

## 2021-06-29 DIAGNOSIS — S065XAA Traumatic subdural hemorrhage with loss of consciousness status unknown, initial encounter: Secondary | ICD-10-CM | POA: Diagnosis present

## 2021-06-29 DIAGNOSIS — R4182 Altered mental status, unspecified: Secondary | ICD-10-CM | POA: Diagnosis present

## 2021-06-29 DIAGNOSIS — J189 Pneumonia, unspecified organism: Secondary | ICD-10-CM | POA: Diagnosis not present

## 2021-06-29 DIAGNOSIS — F32A Depression, unspecified: Secondary | ICD-10-CM | POA: Diagnosis present

## 2021-06-29 DIAGNOSIS — M47812 Spondylosis without myelopathy or radiculopathy, cervical region: Secondary | ICD-10-CM | POA: Diagnosis not present

## 2021-06-29 DIAGNOSIS — I2510 Atherosclerotic heart disease of native coronary artery without angina pectoris: Secondary | ICD-10-CM | POA: Diagnosis present

## 2021-06-29 DIAGNOSIS — U071 COVID-19: Secondary | ICD-10-CM | POA: Diagnosis present

## 2021-06-29 DIAGNOSIS — Z7984 Long term (current) use of oral hypoglycemic drugs: Secondary | ICD-10-CM

## 2021-06-29 DIAGNOSIS — K219 Gastro-esophageal reflux disease without esophagitis: Secondary | ICD-10-CM | POA: Diagnosis present

## 2021-06-29 DIAGNOSIS — S0181XA Laceration without foreign body of other part of head, initial encounter: Secondary | ICD-10-CM | POA: Diagnosis present

## 2021-06-29 DIAGNOSIS — S022XXA Fracture of nasal bones, initial encounter for closed fracture: Secondary | ICD-10-CM | POA: Diagnosis not present

## 2021-06-29 DIAGNOSIS — J9601 Acute respiratory failure with hypoxia: Secondary | ICD-10-CM | POA: Diagnosis present

## 2021-06-29 DIAGNOSIS — R0902 Hypoxemia: Secondary | ICD-10-CM | POA: Diagnosis not present

## 2021-06-29 DIAGNOSIS — R64 Cachexia: Secondary | ICD-10-CM | POA: Diagnosis present

## 2021-06-29 DIAGNOSIS — F419 Anxiety disorder, unspecified: Secondary | ICD-10-CM | POA: Diagnosis present

## 2021-06-29 DIAGNOSIS — S022XXB Fracture of nasal bones, initial encounter for open fracture: Secondary | ICD-10-CM | POA: Diagnosis present

## 2021-06-29 DIAGNOSIS — F03911 Unspecified dementia, unspecified severity, with agitation: Secondary | ICD-10-CM | POA: Diagnosis present

## 2021-06-29 DIAGNOSIS — Z888 Allergy status to other drugs, medicaments and biological substances status: Secondary | ICD-10-CM

## 2021-06-29 DIAGNOSIS — I4891 Unspecified atrial fibrillation: Secondary | ICD-10-CM | POA: Diagnosis present

## 2021-06-29 DIAGNOSIS — Z841 Family history of disorders of kidney and ureter: Secondary | ICD-10-CM

## 2021-06-29 DIAGNOSIS — N189 Chronic kidney disease, unspecified: Secondary | ICD-10-CM | POA: Diagnosis present

## 2021-06-29 DIAGNOSIS — R296 Repeated falls: Secondary | ICD-10-CM | POA: Diagnosis present

## 2021-06-29 DIAGNOSIS — W19XXXA Unspecified fall, initial encounter: Secondary | ICD-10-CM | POA: Diagnosis not present

## 2021-06-29 DIAGNOSIS — R9082 White matter disease, unspecified: Secondary | ICD-10-CM | POA: Diagnosis present

## 2021-06-29 DIAGNOSIS — Z950 Presence of cardiac pacemaker: Secondary | ICD-10-CM

## 2021-06-29 DIAGNOSIS — R918 Other nonspecific abnormal finding of lung field: Secondary | ICD-10-CM | POA: Diagnosis not present

## 2021-06-29 DIAGNOSIS — G4700 Insomnia, unspecified: Secondary | ICD-10-CM | POA: Diagnosis present

## 2021-06-29 DIAGNOSIS — I5084 End stage heart failure: Secondary | ICD-10-CM | POA: Diagnosis present

## 2021-06-29 DIAGNOSIS — Z515 Encounter for palliative care: Secondary | ICD-10-CM

## 2021-06-29 DIAGNOSIS — Z7901 Long term (current) use of anticoagulants: Secondary | ICD-10-CM

## 2021-06-29 DIAGNOSIS — Z79899 Other long term (current) drug therapy: Secondary | ICD-10-CM

## 2021-06-29 DIAGNOSIS — I130 Hypertensive heart and chronic kidney disease with heart failure and stage 1 through stage 4 chronic kidney disease, or unspecified chronic kidney disease: Secondary | ICD-10-CM | POA: Diagnosis present

## 2021-06-29 DIAGNOSIS — J9811 Atelectasis: Secondary | ICD-10-CM | POA: Diagnosis not present

## 2021-06-29 DIAGNOSIS — Z801 Family history of malignant neoplasm of trachea, bronchus and lung: Secondary | ICD-10-CM

## 2021-06-29 DIAGNOSIS — I509 Heart failure, unspecified: Secondary | ICD-10-CM

## 2021-06-29 DIAGNOSIS — Z23 Encounter for immunization: Secondary | ICD-10-CM

## 2021-06-29 DIAGNOSIS — Z87891 Personal history of nicotine dependence: Secondary | ICD-10-CM

## 2021-06-29 DIAGNOSIS — R739 Hyperglycemia, unspecified: Secondary | ICD-10-CM | POA: Diagnosis not present

## 2021-06-29 DIAGNOSIS — I5022 Chronic systolic (congestive) heart failure: Secondary | ICD-10-CM | POA: Diagnosis present

## 2021-06-29 DIAGNOSIS — G9389 Other specified disorders of brain: Secondary | ICD-10-CM | POA: Diagnosis present

## 2021-06-29 DIAGNOSIS — N179 Acute kidney failure, unspecified: Principal | ICD-10-CM | POA: Diagnosis present

## 2021-06-29 DIAGNOSIS — I252 Old myocardial infarction: Secondary | ICD-10-CM

## 2021-06-29 DIAGNOSIS — I5023 Acute on chronic systolic (congestive) heart failure: Secondary | ICD-10-CM | POA: Diagnosis present

## 2021-06-29 DIAGNOSIS — E785 Hyperlipidemia, unspecified: Secondary | ICD-10-CM | POA: Diagnosis present

## 2021-06-29 DIAGNOSIS — S022XXD Fracture of nasal bones, subsequent encounter for fracture with routine healing: Secondary | ICD-10-CM | POA: Diagnosis not present

## 2021-06-29 DIAGNOSIS — R627 Adult failure to thrive: Secondary | ICD-10-CM | POA: Diagnosis present

## 2021-06-29 DIAGNOSIS — Z8616 Personal history of COVID-19: Secondary | ICD-10-CM

## 2021-06-29 DIAGNOSIS — I714 Abdominal aortic aneurysm, without rupture, unspecified: Secondary | ICD-10-CM | POA: Diagnosis present

## 2021-06-29 DIAGNOSIS — I10 Essential (primary) hypertension: Secondary | ICD-10-CM | POA: Diagnosis present

## 2021-06-29 DIAGNOSIS — Z66 Do not resuscitate: Secondary | ICD-10-CM | POA: Diagnosis present

## 2021-06-29 DIAGNOSIS — N400 Enlarged prostate without lower urinary tract symptoms: Secondary | ICD-10-CM | POA: Diagnosis present

## 2021-06-29 DIAGNOSIS — E1122 Type 2 diabetes mellitus with diabetic chronic kidney disease: Secondary | ICD-10-CM | POA: Diagnosis present

## 2021-06-29 DIAGNOSIS — Z882 Allergy status to sulfonamides status: Secondary | ICD-10-CM

## 2021-06-29 DIAGNOSIS — E039 Hypothyroidism, unspecified: Secondary | ICD-10-CM | POA: Diagnosis present

## 2021-06-29 DIAGNOSIS — J1282 Pneumonia due to coronavirus disease 2019: Secondary | ICD-10-CM | POA: Diagnosis present

## 2021-06-29 DIAGNOSIS — E1165 Type 2 diabetes mellitus with hyperglycemia: Secondary | ICD-10-CM | POA: Diagnosis present

## 2021-06-29 DIAGNOSIS — E119 Type 2 diabetes mellitus without complications: Secondary | ICD-10-CM

## 2021-06-29 DIAGNOSIS — R54 Age-related physical debility: Secondary | ICD-10-CM | POA: Diagnosis present

## 2021-06-29 DIAGNOSIS — I482 Chronic atrial fibrillation, unspecified: Secondary | ICD-10-CM | POA: Diagnosis present

## 2021-06-29 DIAGNOSIS — Z951 Presence of aortocoronary bypass graft: Secondary | ICD-10-CM

## 2021-06-29 DIAGNOSIS — Z85828 Personal history of other malignant neoplasm of skin: Secondary | ICD-10-CM

## 2021-06-29 DIAGNOSIS — Z953 Presence of xenogenic heart valve: Secondary | ICD-10-CM

## 2021-06-29 DIAGNOSIS — Z79890 Hormone replacement therapy (postmenopausal): Secondary | ICD-10-CM

## 2021-06-29 DIAGNOSIS — R9431 Abnormal electrocardiogram [ECG] [EKG]: Secondary | ICD-10-CM | POA: Diagnosis not present

## 2021-06-29 LAB — CBC
HCT: 35.2 % — ABNORMAL LOW (ref 39.0–52.0)
Hemoglobin: 11.1 g/dL — ABNORMAL LOW (ref 13.0–17.0)
MCH: 34.7 pg — ABNORMAL HIGH (ref 26.0–34.0)
MCHC: 31.5 g/dL (ref 30.0–36.0)
MCV: 110 fL — ABNORMAL HIGH (ref 80.0–100.0)
Platelets: 64 10*3/uL — ABNORMAL LOW (ref 150–400)
RBC: 3.2 MIL/uL — ABNORMAL LOW (ref 4.22–5.81)
RDW: 18.7 % — ABNORMAL HIGH (ref 11.5–15.5)
WBC: 6 10*3/uL (ref 4.0–10.5)
nRBC: 1.3 % — ABNORMAL HIGH (ref 0.0–0.2)

## 2021-06-29 LAB — COMPREHENSIVE METABOLIC PANEL
ALT: 15 U/L (ref 0–44)
AST: 30 U/L (ref 15–41)
Albumin: 2.5 g/dL — ABNORMAL LOW (ref 3.5–5.0)
Alkaline Phosphatase: 56 U/L (ref 38–126)
Anion gap: 10 (ref 5–15)
BUN: 47 mg/dL — ABNORMAL HIGH (ref 8–23)
CO2: 21 mmol/L — ABNORMAL LOW (ref 22–32)
Calcium: 8.3 mg/dL — ABNORMAL LOW (ref 8.9–10.3)
Chloride: 110 mmol/L (ref 98–111)
Creatinine, Ser: 1.63 mg/dL — ABNORMAL HIGH (ref 0.61–1.24)
GFR, Estimated: 41 mL/min — ABNORMAL LOW (ref >60)
Glucose, Bld: 224 mg/dL — ABNORMAL HIGH (ref 70–99)
Potassium: 4.8 mmol/L (ref 3.5–5.1)
Sodium: 141 mmol/L (ref 135–145)
Total Bilirubin: 1.4 mg/dL — ABNORMAL HIGH (ref 0.3–1.2)
Total Protein: 7.3 g/dL (ref 6.5–8.1)

## 2021-06-29 LAB — PROCALCITONIN: Procalcitonin: <0.1 ng/mL

## 2021-06-29 LAB — TSH: TSH: 10.048 u[IU]/mL — ABNORMAL HIGH (ref 0.350–4.500)

## 2021-06-29 MED ORDER — TETANUS-DIPHTH-ACELL PERTUSSIS 5-2.5-18.5 LF-MCG/0.5 IM SUSY
0.5000 mL | PREFILLED_SYRINGE | Freq: Once | INTRAMUSCULAR | Status: AC
  Administered 2021-06-29: 0.5 mL via INTRAMUSCULAR
  Filled 2021-06-29: qty 0.5

## 2021-06-29 MED ORDER — LEVOTHYROXINE SODIUM 100 MCG PO TABS
100.0000 ug | ORAL_TABLET | Freq: Every day | ORAL | Status: DC
  Administered 2021-07-01: 100 ug via ORAL
  Filled 2021-06-29: qty 1

## 2021-06-29 MED ORDER — CEFTRIAXONE SODIUM 1 G IJ SOLR
1.0000 g | Freq: Once | INTRAMUSCULAR | Status: AC
  Administered 2021-06-29: 1 g via INTRAVENOUS
  Filled 2021-06-29: qty 10

## 2021-06-29 MED ORDER — PANTOPRAZOLE SODIUM 40 MG PO TBEC
40.0000 mg | DELAYED_RELEASE_TABLET | Freq: Every day | ORAL | Status: DC
  Administered 2021-07-01: 40 mg via ORAL
  Filled 2021-06-29 (×2): qty 1

## 2021-06-29 MED ORDER — VITAMIN B-12 1000 MCG PO TABS
1000.0000 ug | ORAL_TABLET | Freq: Every day | ORAL | Status: DC
  Administered 2021-07-01: 1000 ug via ORAL
  Filled 2021-06-29 (×2): qty 1

## 2021-06-29 MED ORDER — TIZANIDINE HCL 2 MG PO TABS
2.0000 mg | ORAL_TABLET | Freq: Two times a day (BID) | ORAL | Status: DC
  Administered 2021-06-30 – 2021-07-01 (×3): 2 mg via ORAL
  Filled 2021-06-29 (×8): qty 1

## 2021-06-29 MED ORDER — INSULIN ASPART 100 UNIT/ML IJ SOLN: 0.0000 [IU] | Freq: Every day | INTRAMUSCULAR | Status: DC

## 2021-06-29 MED ORDER — SODIUM CHLORIDE 0.9 % IV SOLN
500.0000 mg | Freq: Once | INTRAVENOUS | Status: AC
  Administered 2021-06-29: 500 mg via INTRAVENOUS
  Filled 2021-06-29: qty 5

## 2021-06-29 MED ORDER — SIMVASTATIN 20 MG PO TABS
40.0000 mg | ORAL_TABLET | Freq: Every evening | ORAL | Status: DC
  Administered 2021-06-30: 40 mg via ORAL
  Filled 2021-06-29: qty 2

## 2021-06-29 MED ORDER — INSULIN ASPART 100 UNIT/ML IJ SOLN
0.0000 [IU] | Freq: Three times a day (TID) | INTRAMUSCULAR | Status: DC
  Administered 2021-06-30: 1 [IU] via SUBCUTANEOUS
  Administered 2021-06-30: 3 [IU] via SUBCUTANEOUS
  Administered 2021-07-01: 1 [IU] via SUBCUTANEOUS
  Administered 2021-07-01: 2 [IU] via SUBCUTANEOUS
  Filled 2021-06-29 (×3): qty 1

## 2021-06-29 MED ORDER — TEMAZEPAM 15 MG PO CAPS: 30.0000 mg | ORAL_CAPSULE | Freq: Every evening | ORAL | Status: DC | PRN

## 2021-06-29 NOTE — H&P (Signed)
History and Physical   ABHINAV MAYORQUIN OQH:[id number] DOB: Dec 03, 1935 DOA: 06/27/2021  PCP: [name], MD  Outpatient Specialists: Dr. Saralyn Pilar, cardiology Patient coming from: Home via EMS  I have personally briefly reviewed patient's old medical records in [place].  Chief Concern: Frequent falls  HPI: Mr. Brallan Denio is a 86 year old male with atrial fibrillation, previously on Coumadin, recent subdural hematoma secondary to fall, depression, anxiety, BPH, hyperlipidemia, non-insulin-dependent diabetes mellitus, hypothyroid, insomnia, anxiety, who presents emergency department for chief concerns of multiple falls.  Initial vitals in the emergency department show temperature of 97.7, respiration rate of 18, heart rate of 77, blood pressure 107/62, SPO2 of 98% on room air.  Serum sodium is 141, potassium 4.8, chloride 110, bicarb 21, BUN of 47, serum creatinine of 1.63, GFR 41, nonfasting blood glucose 224, WBC 6, hemoglobin 11.1, platelets of 64.  ED treatment: Azithromycin 500 mg IV, ceftriaxone 1 g IV.  At bedside patient was able to tell me his name, his age.  He states the current year is 13 and then he corrected himself and states in 1976.  He states that the current president of the [place] is Doctor, general practice when given multiple choice of Clinton, Bush, Biden, Trump, [place].  Per his spouse, he saw Dr. Doy Hutching and [place] outpatient after discharge and they have not resumed patient's Coumadin stating that they are pending reevaluation from outpatient neurosurgeon.  Patient has an outpatient appointment with Dr. Cari Caraway for 06/30/2021.  Per his wife, patient has been having poor appetite since discharge from hospital. He has been drinking boost, Vanilla however overall poor p.o. intake.  On my second evaluation of patient to the floor, I attempted to irrigate the lacerations on his face and repair with Dermabond and Steri-Strips.  Patient swipes at my hand stating  that he wants to be left alone and wants to go to sleep.  He states that the solution I am using for to irrigate is too cold and he wants warmed water.  His agitation is onset due to my attempts at repairing the lacerations on his wound.  Social history: He lives with wife of nine years.  She states that he is not a tobacco user, no EtOH, or drug use.  ROS: Unable to complete due to mental status changes with possible baseline dementia previously undiagnosed  ED Course: Discussed with emergency medicine provider, patient requiring hospitalization for chief concerns of altered mental status and acute kidney injury.  Assessment/Plan  Principal Problem:   AKI (acute kidney injury) ([place]) Active Problems:   Subdural hematoma (HCC)   Atrial fibrillation (HCC)   HTN (hypertension)   Hypothyroidism   CHF (congestive heart failure) (HCC)   S/p TAVR (transcatheter aortic valve replacement), bioprosthetic   Coronary artery disease   BPH (benign prostatic hyperplasia)   Altered mental status   Facial laceration   Protein calorie malnutrition (HCC)   Hyperlipidemia  Assessment and Plan:  * AKI (acute kidney injury) ([place]) - Etiology work-up , I suspect this is secondary to poor p.o. intake, given patient's mild hypotension - Sodium chloride 1 L bolus ordered, nursing staff states that patient would not allow them to place another IV and patient would not keep his arm straight for the infusion - Goal MAP greater than 65 at this time -Dietary has been consulted - BMP in the a.m.  Hypothyroidism - Levothyroxine 100 mcg daily resumed  Hyperlipidemia - Resumed home simvastatin 40 mg nightly  Protein calorie malnutrition ([place]) -  Moderate to severe - Dietitian has been consulted  Facial laceration - Secondary to fall - Attempted to irrigate and glue laceration in place at bedside - Patient became agitated with my attempts to clean his wounds with sterile water and NS flushes - dermabond  and steristrips left at bedside for AM team attempt  Altered mental status - With frequent falls - Query failure to thrive - CT of the head without contrast was read as no CT findings of acute hemispheric infarction or intracranial hemorrhage.  Stable age-related cerebral atrophy, ventriculomegaly and periventricular white matter disease.  No acute intracranial findings.  Acute nasal bone fractures.  Normal alignment of the cervical spine and no acute cervical spine fracture. - Procalcitonin was less than 0.10 - Fall and aspiration precautions  Chart reviewed.   Hospitalization from 05/12/2021 to 05/15/2021: Patient was admitted for subdural hematoma secondary to a fall in setting of taking warfarin with INR of 3.9. Patient was given Kcentra for reversal of supratherapeutic INR and was also given vitamin K.  Neurosurgery, Dr. Cari Caraway was consulted by ED provider who recommends repeat CT of the head x2 which showed stable and improved.  Patient was advised to hold home warfarin until outpatient follow-up with cardiology. PT and OT were consulted and patient was found to have orthostatic, although denied dizziness.  After gentle IVF administration, orthostasis resolved the next day and PT/OT cleared patient to go home with home health.  Patient also assesses for COVID-positive, and without any symptoms at that time, patient was not started on COVID treatment.  DVT prophylaxis: Recent subdural hematoma, TED hose Code Status: DNR Diet: N.p.o., pending SLP Family Communication: Only spouse can be called for updates.  Disposition Plan: Pending clinical course Consults called: TOC, dietitian, PT, OT Admission status: Telemetry medical, observation  Past Medical History:  Diagnosis Date   AAA (abdominal aortic aneurysm) ([place])    Arrhythmia    atrial fibrillation   Arthritis    BPH (benign prostatic hyperplasia)    Cancer (HCC)    skin   CHF (congestive heart failure) ([place])    Coronary artery  disease    DDD (degenerative disc disease), thoracic    Diabetes mellitus without complication (HCC)    Esophageal stricture    GERD (gastroesophageal reflux disease)    Heart murmur    Hyperlipidemia    Hypertension    Hypothyroidism    Myocardial infarction ([place])    Presence of permanent cardiac pacemaker    Shortness of breath dyspnea    Past Surgical History:  Procedure Laterality Date   AORTIC VALVE REPLACEMENT  1995   Bioprosthetic - [place] x 2 with PCTA of RCA   CARDIAC VALVE REPLACEMENT     Bovine transcatheter heart valve   COLONOSCOPY  2015   CORONARY ARTERY BYPASS GRAFT  1995   x 4 Vessels - [facility]   ELECTROPHYSIOLOGIC STUDY N/A 08/06/2014   Procedure: Cardioversion;  Surgeon: [name], MD;  Location: [facility];  Service: Cardiovascular;  Laterality: N/A;   ELECTROPHYSIOLOGIC STUDY N/A 06/18/2014   Procedure: CARDIOVERSION;  Surgeon: [name], MD;  Location: [facility];  Service: Cardiovascular;  Laterality: N/A;   ESOPHAGOGASTRODUODENOSCOPY  2014   EYE SURGERY     GREAT TOE ARTHRODESIS, INTERPHALANGEAL JOINT Left    HERNIA REPAIR Left    Inguinal   IR RADIOLOGIST EVAL & MGMT  11/04/2019   PACEMAKER INSERTION N/A 07/02/2014  Procedure: INSERTION PACEMAKER/DUAL CHAMBER  INITIAL IMPLANT;  Surgeon: [name], MD;  Location: [facility];  Service: Cardiovascular;  Laterality: N/A;   SKIN BIOPSY     Social History:  reports that he quit smoking about 36 years ago. His smoking use included cigarettes. He has a 7.50 pack-year smoking history. He has quit using smokeless tobacco.  His smokeless tobacco use included chew. He reports that he does not drink alcohol and does not use drugs.  Allergies  Allergen Reactions   Adhesive [Tape] Rash    Ok with Tegaderm    Cefdinir Other (See Comments)    nightmares   Sulfa Antibiotics Rash   Sulfamethoxazole Hives, Itching and Rash   Family History   Problem Relation Age of Onset   Cancer Mother    Lung cancer Father    Kidney disease Sister    Heart failure Brother    Family history: Family history reviewed and not pertinent  Prior to Admission medications   Medication Sig Start Date End Date Taking? Authorizing Provider  acetaminophen (TYLENOL) 500 MG tablet Take 500 mg by mouth every 6 (six) hours as needed for mild pain, moderate pain or fever.     [provider]  DULoxetine (CYMBALTA) 20 MG capsule Take 20 mg by mouth daily. 03/25/21   [provider]  levocetirizine (XYZAL) 5 MG tablet Take 5 mg by mouth every evening. 09/03/19   [provider]  levothyroxine (SYNTHROID) 100 MCG tablet Take 100 mcg by mouth daily. 03/09/21   [provider]  metFORMIN (GLUCOPHAGE) 500 MG tablet Take 500 mg by mouth 2 (two) times daily.     [provider]  metoprolol succinate (TOPROL-XL) 50 MG 24 hr tablet Take 50 mg by mouth daily.     [provider]  nitroGLYCERIN (NITROSTAT) 0.4 MG SL tablet Place 0.4 mg under the tongue every 5 (five) minutes as needed for chest pain.    [provider]  omeprazole (PRILOSEC) 20 MG capsule Take 20 mg by mouth daily.     [provider]  simvastatin (ZOCOR) 40 MG tablet Take 40 mg by mouth every evening.     [provider]  tamsulosin (FLOMAX) 0.4 MG CAPS capsule Take 0.4 mg by mouth every morning.     [provider]  temazepam (RESTORIL) 30 MG capsule Take 30-60 mg by mouth at bedtime as needed for sleep.     [provider]  tiZANidine (ZANAFLEX) 2 MG tablet Take 1 tablet by mouth 2 (two) times daily. 01/31/21   [provider]  vitamin B-12 (CYANOCOBALAMIN) 1000 MCG tablet Take 1,000 mcg by mouth daily.    [provider]  warfarin (COUMADIN) 1 MG tablet Hold until followup with outpatient cardiology due to brain bleed. 05/15/21   Enzo Bi, MD  warfarin (COUMADIN) 3 MG tablet Hold until  followup with outpatient cardiology due to brain bleed. 05/15/21   Enzo Bi, MD   Physical Exam: Vitals:   06/17/2021 1852 07/04/2021 2105 07/13/2021 2240 06/30/21 0027  BP: 117/76 107/62 115/64 (!) 92/57  Pulse: 79 77 75 69  Resp: '18 18 18 20  '$ Temp: (!) 97.4 F (36.3 C) 97.7 F (36.5 C)  97.9 F (36.6 C)  TempSrc: Axillary Oral    SpO2: 100% 98% 96% 100%   Constitutional: appears age-appropriate, frail, cachectic appearing, comfortable Eyes: PERRL, lids and conjunctivae normal ENMT: Mucous membranes are moist. Posterior pharynx clear of any exudate or lesions. Age-appropriate dentition.  Mild  to moderate hearing loss Neck: normal, supple, no masses, no thyromegaly Respiratory: clear to auscultation bilaterally, no wheezing, no crackles. Normal respiratory effort. No accessory muscle use.  Cardiovascular: Regular rate and rhythm, no murmurs / rubs / gallops. No extremity edema. 2+ pedal pulses. No carotid bruits.  Abdomen: Scaphoid abdomen, no tenderness, no masses palpated, no hepatosplenomegaly. Bowel sounds positive.  Musculoskeletal: no clubbing / cyanosis. No joint deformity upper and lower extremities. Good ROM, no contractures, no atrophy. Normal muscle tone.  Skin: left lower forehead laceration and nasal bridge laceration Neurologic: Sensation intact. Strength 5/5 in all 4.  Psychiatric: Normal judgment and insight. Alert and oriented x 3. Normal mood.   EKG: independently reviewed, showing Ventricular paced rhythm with rate of 62, QTc 554.  Chest x-ray on Admission: I personally reviewed and I agree with radiologist reading as below.  DG Chest 1 View  Result Date: 06/21/2021 CLINICAL DATA:  Unwitnessed fall. EXAM: CHEST  1 VIEW COMPARISON:  May 12, 2021. FINDINGS: Stable cardiomediastinal silhouette. Status post coronary bypass graft. Status post transcatheter aortic valve repair. Left-sided pacemaker is unchanged. Left basilar atelectasis is noted with probable small left  pleural effusion. Right upper lobe airspace opacity is noted concerning for pneumonia. Mild right basilar atelectasis or infiltrate is noted as well. Bony thorax is unremarkable. IMPRESSION: Left basilar atelectasis is noted with probable small left pleural effusion. Right upper and lower lobe airspace opacities are noted concerning for pneumonia or possibly atelectasis. Electronically Signed   By: Marijo Conception M.D.   On: 06/26/2021 19:49   CT HEAD WO CONTRAST (5MM)  Result Date: 06/28/2021 CLINICAL DATA:  Unwitnessed fall. EXAM: CT HEAD WITHOUT CONTRAST CT CERVICAL SPINE WITHOUT CONTRAST TECHNIQUE: Multidetector CT imaging of the head and cervical spine was performed following the standard protocol without intravenous contrast. Multiplanar CT image reconstructions of the cervical spine were also generated. RADIATION DOSE REDUCTION: This exam was performed according to the departmental dose-optimization program which includes automated exposure control, adjustment of the mA and/or kV according to patient size and/or use of iterative reconstruction technique. COMPARISON:  Head CT 06/27/2021 and cervical spine CT 05/12/2021 FINDINGS: CT HEAD FINDINGS Brain: Stable age related cerebral atrophy, ventriculomegaly and periventricular white matter disease. No extra-axial fluid collections are identified. No CT findings for acute hemispheric infarction or intracranial hemorrhage. No mass lesions. The brainstem and cerebellum are normal. Vascular: Stable vascular calcifications but no aneurysm hyperdense vessels. Skull: No skull fracture is identified. Acute nasal bone fractures suspected. Sinuses/Orbits: Scattered paranasal sinus disease. The mastoid air cells and middle ear cavities are clear. The globes are intact. Other: No scalp lesions.  Suspect small frontal scalp hematoma. CT CERVICAL SPINE FINDINGS Alignment: Normal Skull base and vertebrae: No acute cervical spine fracture is identified. The skull base C1  and C1-2 articulations are maintained. Soft tissues and spinal canal: Sounds choose 1 Disc levels: Stable advanced degenerative cervical spondylosis with multilevel disc disease and facet disease. Upper chest: The lung apices demonstrate chronic scarring changes and a left pleural effusion. Other: None IMPRESSION: 1. Stable age related cerebral atrophy, ventriculomegaly and periventricular white matter disease. 2. No acute intracranial findings. 3. Acute nasal bone fractures. 4. Normal alignment of the cervical spine and no acute cervical spine fracture. 5. Stable advanced degenerative cervical spondylosis. 6. Left pleural effusion. Electronically Signed   By: Marijo Sanes M.D.   On: 07/13/2021 20:11   CT Cervical Spine Wo Contrast  Result Date: 07/06/2021 CLINICAL DATA:  Unwitnessed fall. EXAM: CT  HEAD WITHOUT CONTRAST CT CERVICAL SPINE WITHOUT CONTRAST TECHNIQUE: Multidetector CT imaging of the head and cervical spine was performed following the standard protocol without intravenous contrast. Multiplanar CT image reconstructions of the cervical spine were also generated. RADIATION DOSE REDUCTION: This exam was performed according to the departmental dose-optimization program which includes automated exposure control, adjustment of the mA and/or kV according to patient size and/or use of iterative reconstruction technique. COMPARISON:  Head CT 06/27/2021 and cervical spine CT 05/12/2021 FINDINGS: CT HEAD FINDINGS Brain: Stable age related cerebral atrophy, ventriculomegaly and periventricular white matter disease. No extra-axial fluid collections are identified. No CT findings for acute hemispheric infarction or intracranial hemorrhage. No mass lesions. The brainstem and cerebellum are normal. Vascular: Stable vascular calcifications but no aneurysm hyperdense vessels. Skull: No skull fracture is identified. Acute nasal bone fractures suspected. Sinuses/Orbits: Scattered paranasal sinus disease. The mastoid  air cells and middle ear cavities are clear. The globes are intact. Other: No scalp lesions.  Suspect small frontal scalp hematoma. CT CERVICAL SPINE FINDINGS Alignment: Normal Skull base and vertebrae: No acute cervical spine fracture is identified. The skull base C1 and C1-2 articulations are maintained. Soft tissues and spinal canal: Sounds choose 1 Disc levels: Stable advanced degenerative cervical spondylosis with multilevel disc disease and facet disease. Upper chest: The lung apices demonstrate chronic scarring changes and a left pleural effusion. Other: None IMPRESSION: 1. Stable age related cerebral atrophy, ventriculomegaly and periventricular white matter disease. 2. No acute intracranial findings. 3. Acute nasal bone fractures. 4. Normal alignment of the cervical spine and no acute cervical spine fracture. 5. Stable advanced degenerative cervical spondylosis. 6. Left pleural effusion. Electronically Signed   By: Marijo Sanes M.D.   On: 07/05/2021 20:11   DG HIP UNILAT WITH PELVIS 2-3 VIEWS LEFT  Result Date: 07/06/2021 CLINICAL DATA:  Left hip pain following fall, initial encounter EXAM: DG HIP (WITH OR WITHOUT PELVIS) 3V LEFT COMPARISON:  None Available. FINDINGS: Pelvic ring is intact. Postsurgical changes are noted consistent with prior hernia repair on the left. No definitive fracture or dislocation is seen. No soft tissue abnormality is noted. IMPRESSION: No acute abnormality noted. Electronically Signed   By: Inez Catalina M.D.   On: 06/26/2021 23:08    Labs on Admission: I have personally reviewed following labs  CBC: Recent Labs  Lab 06/28/2021 1902  WBC 6.0  HGB 11.1*  HCT 35.2*  MCV 110.0*  PLT 64*   Basic Metabolic Panel: Recent Labs  Lab 06/19/2021 1902  NA 141  K 4.8  CL 110  CO2 21*  GLUCOSE 224*  BUN 47*  CREATININE 1.63*  CALCIUM 8.3*   GFR: CrCl cannot be calculated (Unknown ideal weight.).  Liver Function Tests: Recent Labs  Lab 07/01/2021 1902  AST 30   ALT 15  ALKPHOS 56  BILITOT 1.4*  PROT 7.3  ALBUMIN 2.5*   CBG: Recent Labs  Lab 06/30/21 0033  GLUCAP 141*   Thyroid Function Tests: Recent Labs    07/09/2021 1902  TSH 10.048*   Urine analysis:    Component Value Date/Time   COLORURINE YELLOW (A) 05/12/2021 1740   APPEARANCEUR HAZY (A) 05/12/2021 1740   APPEARANCEUR Clear 05/19/2014 1144   LABSPEC 1.019 05/12/2021 1740   LABSPEC 1.005 05/19/2014 1144   PHURINE 5.0 05/12/2021 1740   GLUCOSEU NEGATIVE 05/12/2021 1740   GLUCOSEU Negative 05/19/2014 1144   HGBUR NEGATIVE 05/12/2021 1740   BILIRUBINUR NEGATIVE 05/12/2021 1740   BILIRUBINUR Negative 05/19/2014 1144   KETONESUR  5 (A) 05/12/2021 1740   PROTEINUR NEGATIVE 05/12/2021 1740   NITRITE NEGATIVE 05/12/2021 1740   LEUKOCYTESUR NEGATIVE 05/12/2021 1740   LEUKOCYTESUR Negative 05/19/2014 1144   Dr. Tobie Poet [facility]  If 7PM-7AM, please contact overnight-coverage provider If 7AM-7PM, please contact day coverage provider [email or web address]  06/30/2021, 2:53 AM

## 2021-06-29 NOTE — ED Triage Notes (Signed)
Pt comes ems from home with unwitnessed fall. Not on thinners. Abrasions to forehead, nose, top of head. Aox1 at baseline. C/o neck pain but pt states he had neck pain before the fall. CBG 253.  ?

## 2021-06-29 NOTE — ED Notes (Signed)
Pt to XR

## 2021-06-29 NOTE — Assessment & Plan Note (Addendum)
Secondary to decompensated heart failure plus poor p.o. intake.  Received some fluids which briefly helped, but as soon as fluids were stopped, his renal function has been declining, consistent with his heart failure and soft blood pressures.  Labs stopped for comfort care

## 2021-06-29 NOTE — Assessment & Plan Note (Addendum)
Secondary to heart failure and dehydration.

## 2021-06-29 NOTE — Hospital Course (Addendum)
Mr. Derrick Cochran is a 86 year old male with atrial fibrillation, previously on Coumadin, recent subdural hematoma secondary to fall, end-stage systolic heart failure with an ejection fraction of less than 15%, non-insulin-dependent diabetes mellitus, and hypothyroidism, who presented to the emergency department on 5/17 for chief concerns of multiple falls.  In the emergency room, patient noted to have acute kidney injury with a creatinine of 1.63 (was 1.05 6 weeks prior) and also confirmed positive COVID test, noting that patient had tested positive 6 weeks ago as well.  Patient admitted to the hospital service and initially started on IV fluids.  He was not hypoxic so he was not started on any medications.  CRP levels normal.  In the emergency room, patient also noted to have multiple bruises secondary to falls and an acute nasal fracture.  According to patient's wife, his p.o. had been stopped recently.   BNP checked and found to be elevated at 4500.  Patient started becoming hypoxic on evening of 5/18 and fluids stopped.   Attempts to give patient IV Lasix have been quite limited due to hypotension.  By 5/19, renal function is also worse with creatinine up to 1.75.  Cardiology consulted and overall recommendation is supportive measures and to consider comfort care.  By end of day 5/19, wife requested that he be made comfortable.  Medications and labs stopped and morphine drip started.  Patient expired on 5/21 at 4:25 PM.

## 2021-06-29 NOTE — ED Provider Notes (Signed)
[facility] Provider Note    Event Date/Time   First MD Initiated Contact with Patient 06/28/2021 2120     (approximate)   History   Fall   HPI  Derrick Cochran is a 86 y.o. male who comes from home with an unknown witnessed fall.  He has a note from the recent clinic visit saying he was more confused.  He has a recent CT that showed resolution of the previous subdural hematoma.  CT today does not show any intracranial hemorrhage CT of the neck does not show any problems there either he does have a nasal fracture which is open.  When I speak with him he is not sure where he is he says he needs to make a phone call to the police to get himself from room for tonight.  I had already told him he was coming in the hospital.  He seems to be quite confused.  He does not seem to have a diagnosis of dementia that I can find.  He is hypothyroid by history.      Physical Exam   Triage Vital Signs: ED Triage Vitals [07/12/2021 1852]  Enc Vitals Group     BP 117/76     Pulse Rate 79     Resp 18     Temp (!) 97.4 F (36.3 C)     Temp Source Axillary     SpO2 100 %     Weight      Height      Head Circumference      Peak Flow      Pain Score      Pain Loc      Pain Edu?      Excl. in [place]?     Most recent vital signs: Vitals:   07/07/2021 2240 06/30/21 0027  BP: 115/64 (!) 92/57  Pulse: 75 69  Resp: 18 20  Temp:  97.9 F (36.6 C)  SpO2: 96% 100%     General: Awake, no distress.  Confused. Head normocephalic atraumatic except for an apparent open nasal fracture and a cut that is L-shaped in the left eyebrow. CV:  Good peripheral perfusion.  Heart regular rate and rhythm no audible murmurs Resp:  Normal effort.  Lungs sound clear to my exam in the very noisy hallway Abd:  No distention.  Soft and nontender scaphoid Other:  Extremities not tender in the arms shoulders hips legs pelvis no edema.   ED Results / Procedures / Treatments   Labs (all labs  ordered are listed, but only abnormal results are displayed) Labs Reviewed  CBC - Abnormal; Notable for the following components:      Result Value   RBC 3.20 (*)    Hemoglobin 11.1 (*)    HCT 35.2 (*)    MCV 110.0 (*)    MCH 34.7 (*)    RDW 18.7 (*)    Platelets 64 (*)    nRBC 1.3 (*)    All other components within normal limits  COMPREHENSIVE METABOLIC PANEL - Abnormal; Notable for the following components:   CO2 21 (*)    Glucose, Bld 224 (*)    BUN 47 (*)    Creatinine, Ser 1.63 (*)    Calcium 8.3 (*)    Albumin 2.5 (*)    Total Bilirubin 1.4 (*)    GFR, Estimated 41 (*)    All other components within normal limits  TSH - Abnormal; Notable for the following  components:   TSH 10.048 (*)    All other components within normal limits  GLUCOSE, CAPILLARY - Abnormal; Notable for the following components:   Glucose-Capillary 141 (*)    All other components within normal limits  RESP PANEL BY RT-PCR (FLU A&B, COVID) ARPGX2  PROCALCITONIN  URINALYSIS, ROUTINE W REFLEX MICROSCOPIC     EKG  EKG ventricularly paced rhythm at 62 extreme right axis.   RADIOLOGY Head CT read by radiology films reviewed and interpreted by me show no acute disease intracranially there is a displaced nasal fracture that looks acute CT of the neck read by radiology films reviewed and interpreted by me do not show any acute fractures Chest x-ray read by radiology reviewed by me shows some atelectasis and/or airspace disease.  Possible pneumonia  PROCEDURES:  Critical Care performed:   Procedures   MEDICATIONS ORDERED IN ED: Medications  temazepam (RESTORIL) capsule 30-60 mg (has no administration in time range)  simvastatin (ZOCOR) tablet 40 mg (40 mg Oral Not Given 06/30/21 0011)  pantoprazole (PROTONIX) EC tablet 40 mg (has no administration in time range)  levothyroxine (SYNTHROID) tablet 100 mcg (has no administration in time range)  vitamin B-12 (CYANOCOBALAMIN) tablet 1,000 mcg (has no  administration in time range)  tiZANidine (ZANAFLEX) tablet 2 mg (2 mg Oral Not Given 06/30/21 0011)  insulin aspart (novoLOG) injection 0-9 Units (has no administration in time range)  insulin aspart (novoLOG) injection 0-5 Units (0 Units Subcutaneous Not Given 06/30/21 0036)  cefTRIAXone (ROCEPHIN) 1 g in sodium chloride 0.9 % 100 mL IVPB (0 g Intravenous Stopped 06/14/2021 2218)  azithromycin (ZITHROMAX) 500 mg in sodium chloride 0.9 % 250 mL IVPB (0 mg Intravenous Stopped 06/30/21 0012)  Tdap (BOOSTRIX) injection 0.5 mL (0.5 mLs Intramuscular Given 07/06/2021 2149)     IMPRESSION / MDM / ASSESSMENT AND PLAN / ED COURSE  I reviewed the triage vital signs and the nursing notes.  Patient was in the emergency room waiting for me to sew up his laceration and attend to his nasal fracture however a code came in and he went upstairs and I was unable to complete this work.  Patient is now upstairs.  Patient was quite confused.  He has a note from clinic visit saying he was confused and he had had a previous subdural hematoma which had resolved.  There was no sign of subdural hematoma today. His TSH is markedly elevated.  Possibly he is forgetting to take his thyroid replacement.  I am not sure if he is forgetting because he is demented it were the TSH being elevated and him being hypothyroid is the cause of his altered mental status.  For sure his hypothyroidism is contributing to his confusion.  Additionally he has an elevated MCV and RDW.  A B12 and folate might be useful in view of his confusion.  Chest x-ray looks to me as though he had a pneumonia.  Radiologist also thought there may be a pneumonia.  Lab work also showed an AKI.  His usual GFR last month was greater than 60 today was 41.  This may have contributed to his fall if he had gotten dehydrated possibly due to forgetting to drink because he was confused. The patient is on the cardiac monitor to evaluate for evidence of arrhythmia and/or significant  heart rate changes.  None were seen.  Clinical Course as of 06/30/21 0044  Wed Jun 29, 2021  2128 DG Chest 1 View [PM]    Clinical Course User  Index [PM] Nena Polio, MD     FINAL CLINICAL IMPRESSION(S) / ED DIAGNOSES   Final diagnoses:  Fall, initial encounter  AKI (acute kidney injury) ([place])  Community acquired pneumonia, unspecified laterality  Open fracture of nasal bone, initial encounter     Rx / DC Orders   ED Discharge Orders     None        Note:  This document was prepared using Dragon voice recognition software and may include unintentional dictation errors.   Nena Polio, MD 06/30/21 [phone]

## 2021-06-30 ENCOUNTER — Inpatient Hospital Stay: Payer: Medicare HMO

## 2021-06-30 DIAGNOSIS — U071 COVID-19: Secondary | ICD-10-CM

## 2021-06-30 DIAGNOSIS — R0902 Hypoxemia: Secondary | ICD-10-CM | POA: Diagnosis not present

## 2021-06-30 DIAGNOSIS — E1122 Type 2 diabetes mellitus with diabetic chronic kidney disease: Secondary | ICD-10-CM | POA: Diagnosis present

## 2021-06-30 DIAGNOSIS — I5022 Chronic systolic (congestive) heart failure: Secondary | ICD-10-CM | POA: Diagnosis not present

## 2021-06-30 DIAGNOSIS — F03918 Unspecified dementia, unspecified severity, with other behavioral disturbance: Secondary | ICD-10-CM | POA: Diagnosis not present

## 2021-06-30 DIAGNOSIS — F32A Depression, unspecified: Secondary | ICD-10-CM | POA: Diagnosis present

## 2021-06-30 DIAGNOSIS — E43 Unspecified severe protein-calorie malnutrition: Secondary | ICD-10-CM

## 2021-06-30 DIAGNOSIS — E785 Hyperlipidemia, unspecified: Secondary | ICD-10-CM | POA: Diagnosis present

## 2021-06-30 DIAGNOSIS — S022XXB Fracture of nasal bones, initial encounter for open fracture: Secondary | ICD-10-CM | POA: Diagnosis present

## 2021-06-30 DIAGNOSIS — I130 Hypertensive heart and chronic kidney disease with heart failure and stage 1 through stage 4 chronic kidney disease, or unspecified chronic kidney disease: Secondary | ICD-10-CM | POA: Diagnosis present

## 2021-06-30 DIAGNOSIS — R64 Cachexia: Secondary | ICD-10-CM | POA: Diagnosis present

## 2021-06-30 DIAGNOSIS — I482 Chronic atrial fibrillation, unspecified: Secondary | ICD-10-CM | POA: Diagnosis present

## 2021-06-30 DIAGNOSIS — I959 Hypotension, unspecified: Secondary | ICD-10-CM | POA: Diagnosis present

## 2021-06-30 DIAGNOSIS — I5023 Acute on chronic systolic (congestive) heart failure: Secondary | ICD-10-CM | POA: Diagnosis present

## 2021-06-30 DIAGNOSIS — G9389 Other specified disorders of brain: Secondary | ICD-10-CM | POA: Diagnosis present

## 2021-06-30 DIAGNOSIS — Z515 Encounter for palliative care: Secondary | ICD-10-CM | POA: Diagnosis not present

## 2021-06-30 DIAGNOSIS — Z66 Do not resuscitate: Secondary | ICD-10-CM | POA: Diagnosis present

## 2021-06-30 DIAGNOSIS — E039 Hypothyroidism, unspecified: Secondary | ICD-10-CM | POA: Diagnosis present

## 2021-06-30 DIAGNOSIS — R627 Adult failure to thrive: Secondary | ICD-10-CM | POA: Diagnosis present

## 2021-06-30 DIAGNOSIS — N179 Acute kidney failure, unspecified: Secondary | ICD-10-CM | POA: Diagnosis present

## 2021-06-30 DIAGNOSIS — D696 Thrombocytopenia, unspecified: Secondary | ICD-10-CM | POA: Diagnosis present

## 2021-06-30 DIAGNOSIS — I714 Abdominal aortic aneurysm, without rupture, unspecified: Secondary | ICD-10-CM | POA: Diagnosis present

## 2021-06-30 DIAGNOSIS — Z8616 Personal history of COVID-19: Secondary | ICD-10-CM | POA: Diagnosis not present

## 2021-06-30 DIAGNOSIS — J9601 Acute respiratory failure with hypoxia: Secondary | ICD-10-CM | POA: Diagnosis present

## 2021-06-30 DIAGNOSIS — S0181XA Laceration without foreign body of other part of head, initial encounter: Secondary | ICD-10-CM | POA: Diagnosis present

## 2021-06-30 DIAGNOSIS — J189 Pneumonia, unspecified organism: Secondary | ICD-10-CM | POA: Diagnosis present

## 2021-06-30 DIAGNOSIS — I25810 Atherosclerosis of coronary artery bypass graft(s) without angina pectoris: Secondary | ICD-10-CM | POA: Diagnosis not present

## 2021-06-30 DIAGNOSIS — W19XXXA Unspecified fall, initial encounter: Secondary | ICD-10-CM | POA: Diagnosis not present

## 2021-06-30 DIAGNOSIS — N189 Chronic kidney disease, unspecified: Secondary | ICD-10-CM | POA: Diagnosis present

## 2021-06-30 DIAGNOSIS — Z953 Presence of xenogenic heart valve: Secondary | ICD-10-CM | POA: Diagnosis not present

## 2021-06-30 DIAGNOSIS — Z23 Encounter for immunization: Secondary | ICD-10-CM | POA: Diagnosis present

## 2021-06-30 DIAGNOSIS — Z681 Body mass index (BMI) 19 or less, adult: Secondary | ICD-10-CM | POA: Diagnosis not present

## 2021-06-30 LAB — BASIC METABOLIC PANEL
Anion gap: 17 — ABNORMAL HIGH (ref 5–15)
BUN: 49 mg/dL — ABNORMAL HIGH (ref 8–23)
CO2: 18 mmol/L — ABNORMAL LOW (ref 22–32)
Calcium: 8.3 mg/dL — ABNORMAL LOW (ref 8.9–10.3)
Chloride: 109 mmol/L (ref 98–111)
Creatinine, Ser: 1.54 mg/dL — ABNORMAL HIGH (ref 0.61–1.24)
GFR, Estimated: 44 mL/min — ABNORMAL LOW (ref >60)
Glucose, Bld: 135 mg/dL — ABNORMAL HIGH (ref 70–99)
Potassium: 4.4 mmol/L (ref 3.5–5.1)
Sodium: 144 mmol/L (ref 135–145)

## 2021-06-30 LAB — RESP PANEL BY RT-PCR (FLU A&B, COVID) ARPGX2
Influenza A by PCR: NEGATIVE
Influenza B by PCR: NEGATIVE
SARS Coronavirus 2 by RT PCR: POSITIVE — AB

## 2021-06-30 LAB — GLUCOSE, CAPILLARY
Glucose-Capillary: 117 mg/dL — ABNORMAL HIGH (ref 70–99)
Glucose-Capillary: 118 mg/dL — ABNORMAL HIGH (ref 70–99)
Glucose-Capillary: 132 mg/dL — ABNORMAL HIGH (ref 70–99)
Glucose-Capillary: 141 mg/dL — ABNORMAL HIGH (ref 70–99)
Glucose-Capillary: 226 mg/dL — ABNORMAL HIGH (ref 70–99)

## 2021-06-30 LAB — T4, FREE: Free T4: 0.85 ng/dL (ref 0.61–1.12)

## 2021-06-30 LAB — BRAIN NATRIURETIC PEPTIDE: B Natriuretic Peptide: >4500 pg/mL — ABNORMAL HIGH (ref 0.0–100.0)

## 2021-06-30 LAB — C-REACTIVE PROTEIN: CRP: 2.3 mg/dL — ABNORMAL HIGH (ref <1.0)

## 2021-06-30 MED ORDER — ADULT MULTIVITAMIN W/MINERALS CH
1.0000 | ORAL_TABLET | Freq: Every day | ORAL | Status: DC
  Administered 2021-06-30 – 2021-07-01 (×2): 1 via ORAL
  Filled 2021-06-30 (×2): qty 1

## 2021-06-30 MED ORDER — SODIUM CHLORIDE 0.9 % IV SOLN: INTRAVENOUS | Status: DC

## 2021-06-30 MED ORDER — ENOXAPARIN SODIUM 40 MG/0.4ML IJ SOSY
40.0000 mg | PREFILLED_SYRINGE | INTRAMUSCULAR | Status: DC
  Administered 2021-06-30: 40 mg via SUBCUTANEOUS
  Filled 2021-06-30: qty 0.4

## 2021-06-30 MED ORDER — ENSURE ENLIVE PO LIQD
237.0000 mL | Freq: Three times a day (TID) | ORAL | Status: DC
  Administered 2021-06-30 – 2021-07-01 (×3): 237 mL via ORAL

## 2021-06-30 MED ORDER — SODIUM CHLORIDE 0.9 % IV BOLUS
1000.0000 mL | Freq: Once | INTRAVENOUS | Status: AC
  Administered 2021-06-30: 1000 mL via INTRAVENOUS

## 2021-06-30 NOTE — Progress Notes (Signed)
[facility] Progress Note  Patient: Derrick Cochran    VHQ:[id number]  DOA: 07/04/2021    Date of Service: the patient was seen and examined on 06/30/2021  Brief hospital course: Mr. Derrick Cochran is a 86 year old male with atrial fibrillation, previously on Coumadin, recent subdural hematoma secondary to fall, depression, anxiety, BPH, hyperlipidemia, non-insulin-dependent diabetes mellitus, hypothyroid, insomnia, anxiety, who presents emergency department for chief concerns of multiple falls.  In the emergency room, patient noted to have acute kidney injury with a creatinine of 1.63 (was 1.05 6 weeks prior) and also confirmed positive COVID test, noting that patient had tested positive 6 weeks ago as well.  Patient admitted to the hospital service and started on IV fluids.  He was not hypoxic so he was not started on any medications.  The emergency room, patient also noted to have multiple bruises secondary to falls and an acute nasal fracture.  Assessment and Plan: Assessment and Plan: * AKI (acute kidney injury) ([place]) - Etiology work-up , I suspect this is secondary to poor p.o. intake, given patient's mild hypotension - Sodium chloride 1 L bolus ordered, nursing staff states that patient would not allow them to place another IV and patient would not keep his arm straight for the infusion - Goal MAP greater than 65 at this time -Dietary has been consulted - BMP in the a.m.  COVID-19 virus infection Unclear if this is a new secondary infection.  He should have tested negative by now given positive test 6 weeks ago.  Checking CRP.  He is not hypoxic at this time, so no need for Paxlovid or steroids  Senile dementia uncomp, with behavioral disturbance ([place]) Secondary to COVID and dehydration.  Wife at bedside and patient is mildly agitated.  Facial laceration - Secondary to fall - Attempted to irrigate and glue laceration in place at bedside - Patient became agitated with my attempts  to clean his wounds with sterile water and NS flushes Wound care consult  Chronic systolic CHF (congestive heart failure) ([place]) Last echocardiogram noted ejection fraction of 30 to 35% in 2017.  We will check BNP, but for now, patient does need IV fluids.  HTN (hypertension) Blood pressure low so treated with IV fluids.  Antihypertensives on hold.  Atrial fibrillation (HCC) Chronic.  Rate controlled.  No longer on Coumadin.  Hyperlipidemia - Resumed home simvastatin 40 mg nightly  Protein-calorie malnutrition, severe Nutrition Status: Nutrition Problem: Severe Malnutrition Etiology: chronic illness (SDH) Signs/Symptoms: severe fat depletion, moderate fat depletion, moderate muscle depletion, severe muscle depletion Interventions: Ensure Enlive (each supplement provides 350kcal and 20 grams of protein), MVI, Prostat     Hypothyroidism - Levothyroxine 100 mcg daily resumed       There is no height or weight on file to calculate BMI.  Nutrition Problem: Severe Malnutrition Etiology: chronic illness (SDH)     Consultants: None  Procedures: None  Antimicrobials: IV Rocephin and Zithromax x1 dose 5/17  Code Status: DNR   Subjective: Patient delirious, feels tired  Objective: Noted soft blood pressures. Vitals:   06/30/21 0723 06/30/21 1700  BP: (!) 93/42 (!) 83/45  Pulse: 67 81  Resp: 18 18  Temp: 98.4 F (36.9 C) (!) 97.5 F (36.4 C)  SpO2: 97% (!) 68%    Intake/Output Summary (Last 24 hours) at 06/30/2021 1733 Last data filed at 06/30/2021 1300 Gross per 24 hour  Intake 17.8 ml  Output 500 ml  Net -482.2 ml   There were no vitals filed for  this visit. There is no height or weight on file to calculate BMI.  Exam:  General: Oriented x1, mildly agitated HEENT: Normocephalic, noted nasal fracture and multiple bruises Cardiovascular: Irregular rhythm, rate controlled Respiratory: Clear to auscultation bilaterally Abdomen: Soft, nontender,  nondistended, hypoactive bowel sounds Musculoskeletal: Night clubbing cyanosis or edema Skin: Noted bruising across torso, extremities Psychiatry: Underlying dementia Neurology: No focal deficits  Data Reviewed: Minimum improvement in renal function  Disposition:  Status is: Inpatient Remains inpatient appropriate because: Further stabilization, improvement in renal function    Anticipated discharge date: 5/22  Remaining issues to be resolved so that patient can be discharged: Skilled nursing   Family Communication: Wife at the bedside DVT Prophylaxis:   Lovenox    Author: Annita Brod ,MD 06/30/2021 5:33 PM  To reach On-call, see care teams to locate the attending and reach out via [email or web address]. Between 7PM-7AM, please contact night-coverage If you still have difficulty reaching the attending provider, please page the [facility] (Director on Call) for [facility] on amion for assistance.

## 2021-06-30 NOTE — Progress Notes (Addendum)
Staff came in pt room and noice t is having labored breathing. Pt oxygen on assessmnet was at 85 to 87% on RA. Pt was also on 0.9 % sodium chloride at 125 ml/hr. Pt was palced on 2 liters oxygen and pt sating now at 95%. NP Randol Kern made aware and placed order. Will continue to monitor.

## 2021-06-30 NOTE — Assessment & Plan Note (Addendum)
Nutrition Status: Nutrition Problem: Severe Malnutrition Etiology: chronic illness (SDH) Signs/Symptoms: severe fat depletion, moderate fat depletion, moderate muscle depletion, severe muscle depletion Interventions: Ensure Enlive (each supplement provides 350kcal and 20 grams of protein), MVI, Prostat

## 2021-06-30 NOTE — Assessment & Plan Note (Signed)
Blood pressure low so treated with IV fluids.  Antihypertensives on hold.

## 2021-06-30 NOTE — Evaluation (Signed)
Occupational Therapy Evaluation Patient Details Name: Derrick Cochran MRN: [id number] DOB: 27-Feb-1935 Today's Date: 06/30/2021   History of Present Illness 86 year old male with atrial fibrillation, previously on Coumadin, recent subdural hematoma secondary to fall, depression, anxiety, BPH, hyperlipidemia, non-insulin-dependent diabetes mellitus, hypothyroid, insomnia, anxiety, who presents emergency department for chief concerns of multiple falls.   Clinical Impression   Patient presenting with decreased Ind in self care,balance, functional mobility/transfers, endurance, and safety awareness. Patient's family not present to confirm baseline. Information obtained via prior admission ~ 1 month ago. Pt lives with wife and son who assist him with self care and always providing supervision when he ambulates with Rollator.  Patient currently functioning at min A during session. He does appear to fatigue quickly but with cognition he would likely do much better at home vs SNF. Patient will benefit from acute OT to increase overall independence in the areas of ADLs, functional mobility, and safety awareness in order to safely discharge home with family.      Recommendations for follow up therapy are one component of a multi-disciplinary discharge planning process, led by the attending physician.  Recommendations may be updated based on patient status, additional functional criteria and insurance authorization.   Follow Up Recommendations  Home health OT    Assistance Recommended at Discharge Frequent or constant Supervision/Assistance  Patient can return home with the following A little help with walking and/or transfers;A little help with bathing/dressing/bathroom;Help with stairs or ramp for entrance;Direct supervision/assist for financial management;Assistance with cooking/housework;Direct supervision/assist for medications management;Assist for transportation    Functional Status Assessment   Patient has had a recent decline in their functional status and demonstrates the ability to make significant improvements in function in a reasonable and predictable amount of time.  Equipment Recommendations  None recommended by OT       Precautions / Restrictions Precautions Precautions: Fall      Mobility Bed Mobility Overal bed mobility: Needs Assistance Bed Mobility: Supine to Sit     Supine to sit: Min guard     General bed mobility comments: assist for trunk support with min cuing for technique and hand placement    Transfers Overall transfer level: Needs assistance Equipment used: 1 person hand held assist Transfers: Sit to/from Stand, Bed to chair/wheelchair/BSC Sit to Stand: Min assist     Step pivot transfers: Min assist            Balance Overall balance assessment: Needs assistance, History of Falls Sitting-balance support: Feet supported Sitting balance-Leahy Scale: Good     Standing balance support: During functional activity Standing balance-Leahy Scale: Poor                             ADL either performed or assessed with clinical judgement   ADL Overall ADL's : Needs assistance/impaired     Grooming: Wash/dry hands;Wash/dry face;Sitting;Set up;Supervision/safety               Lower Body Dressing: Minimal assistance Lower Body Dressing Details (indicate cue type and reason): to don/doff socks Toilet Transfer: Minimal assistance Toilet Transfer Details (indicate cue type and reason): simulated                 Vision Patient Visual Report: No change from baseline              Pertinent Vitals/Pain Pain Assessment Pain Assessment: Faces Faces Pain Scale: Hurts a little bit Pain Location: generalized Pain Descriptors /  Indicators: Discomfort Pain Intervention(s): Monitored during session, Repositioned     Hand Dominance Right   Extremity/Trunk Assessment Upper Extremity Assessment Upper Extremity  Assessment: Generalized weakness;Overall WFL for tasks assessed   Lower Extremity Assessment Lower Extremity Assessment: Generalized weakness       Communication Communication Communication: No difficulties   Cognition Arousal/Alertness: Awake/alert Behavior During Therapy: Flat affect Overall Cognitive Status: No family/caregiver present to determine baseline cognitive functioning                                 General Comments: Pt with history of cognitive impairments at baseline and per chart review pt knows family and is oriented to self only. Pt follows 1 step commands with increased time.                Home Living Family/patient expects to be discharged to:: Private residence Living Arrangements: Spouse/significant other Available Help at Discharge: Family;Available 24 hours/day (lives with wife and 46 y/o son) Type of Home: Mobile home Home Access: Ramped entrance     [place]: One level     Bathroom Shower/Tub: Teacher, early years/pre: Standard Bathroom Accessibility: Yes   Home Equipment: Rollator (4 wheels);Shower seat;Wheelchair - manual   Additional Comments: Home set up and equipment obtained from previous admission ~ 1 month ago as no family present during assessment.      Prior Functioning/Environment Prior Level of Function : Needs assist             Mobility Comments: amb with rollator with wife providing supervision ADLs Comments: supervision- MIN A with all ADL from wife        OT Problem List: Decreased strength;Decreased activity tolerance;Impaired balance (sitting and/or standing);Decreased safety awareness;Decreased cognition      OT Treatment/Interventions: Self-care/ADL training;Balance training;Therapeutic exercise;Therapeutic activities;Energy conservation;DME and/or AE instruction;Patient/family education    OT Goals(Current goals can be found in the care plan section) Acute Rehab OT Goals Patient  Stated Goal: to get in chair OT Goal Formulation: With patient Time For Goal Achievement: 07/14/21 Potential to Achieve Goals: Fair ADL Goals Pt Will Perform Grooming: standing;with supervision Pt Will Perform Lower Body Dressing: with supervision;sit to/from stand Pt Will Transfer to Toilet: with supervision;ambulating Pt Will Perform Toileting - Clothing Manipulation and hygiene: with supervision;sit to/from stand  OT Frequency: Min 2X/week       AM-PAC OT "6 Clicks" Daily Activity     Outcome Measure Help from another person eating meals?: A Little Help from another person taking care of personal grooming?: A Little Help from another person toileting, which includes using toliet, bedpan, or urinal?: A Little Help from another person bathing (including washing, rinsing, drying)?: A Little Help from another person to put on and taking off regular upper body clothing?: A Little Help from another person to put on and taking off regular lower body clothing?: A Little 6 Click Score: 18   End of Session Nurse Communication: Mobility status  Activity Tolerance: Patient tolerated treatment well;Patient limited by fatigue Patient left: in chair;with chair alarm set;with call bell/phone within reach  OT Visit Diagnosis: Unsteadiness on feet (R26.81);Repeated falls (R29.6);Muscle weakness (generalized) (M62.81);History of falling (Z91.81)                Time: 8299-3716 OT Time Calculation (min): 18 min Charges:  OT General Charges $OT Visit: 1 Visit OT Evaluation $OT Eval Low Complexity: 1 Low OT Treatments $Self  Care/Home Management : 8-22 mins  Darleen Crocker, MS, OTR/L , CBIS ascom [phone]  06/30/21, 3:11 PM

## 2021-06-30 NOTE — Progress Notes (Incomplete)
Cross Cover Patient now with labored breathing and sats 87% on room air whic improved with supplemental oxygen 2 L per nasal cannula. BNP over 4000. IV fluids discontinued.  Chest xray reviewed by me. No overt vascular congestion but given new onset need for oxygen and low EF with elevated BNP will continue to hold IV fluids.

## 2021-06-30 NOTE — Progress Notes (Signed)
Cross Cover Patient now with labored breathing and sats 87% on room air whic improved with supplemental oxygen 2 L per nasal cannula. BNP over 4000. IV fluids discontinued.  Chest xray reviewed by me. No overt vascular congestion but given new onset need for oxygen and low EF with elevated BNP will continue to hold IV fluids.

## 2021-06-30 NOTE — Progress Notes (Addendum)
Initial Nutrition Assessment  DOCUMENTATION CODES:   Underweight, Severe malnutrition in context of chronic illness  INTERVENTION:   -Obtain new wt -MVI with minerals daily -Ensure Enlive po TID, each supplement provides 350 kcal and 20 grams of protein -30 ml Prosource Plus TID, each supplement provides 100 kcals and 15 grams protein  NUTRITION DIAGNOSIS:   Severe Malnutrition related to chronic illness (SDH) as evidenced by severe fat depletion, moderate fat depletion, moderate muscle depletion, severe muscle depletion.  GOAL:   Patient will meet greater than or equal to 90% of their needs  MONITOR:   PO intake, Supplement acceptance, Diet advancement  REASON FOR ASSESSMENT:   Consult Assessment of nutrition requirement/status  ASSESSMENT:   Pt with atrial fibrillation, previously on Coumadin, recent subdural hematoma secondary to fall, depression, anxiety, BPH, hyperlipidemia, non-insulin-dependent diabetes mellitus, hypothyroid, insomnia, anxiety, who presents for concerns of multiple falls.  Pt admitted with AKI and AMS.   5/18- s/p BSE- advanced to dysphagia 1 diet with thin liquids  Reviewed I/O's: +18 ml x 24 hours   Case discussed with nurse tech; pt was agitated earlier, but was given medications to help calm him.   No recent wt ordered. Pt in recliner chair so unable to obtain current wt at this time.   Pt very somnolent at time of visit. Unable to arouse with voice or touch. No family at bedside to provide additional history.   Per H&P, pt with poor oral intake since last hospital discharge. Pt was consuming vanilla Boost PTA, however, little else.   Medications reviewed and include vitamin B-12.   Lab Results  Component Value Date   HGBA1C 6.8 (H) 05/12/2021   PTA DM medications are 500 mg metformin BID.   Labs reviewed: CBGS: 117-141 (inpatient orders for glycemic control are 0-5 units insulin aspart at bedtime and 0-9 units insulin aspart TID with  meals).    NUTRITION - FOCUSED PHYSICAL EXAM:  Flowsheet Row Most Recent Value  Orbital Region Moderate depletion  Upper Arm Region Severe depletion  Thoracic and Lumbar Region Severe depletion  Buccal Region Severe depletion  Temple Region Severe depletion  Clavicle Bone Region Severe depletion  Clavicle and Acromion Bone Region Severe depletion  Scapular Bone Region Severe depletion  Dorsal Hand Moderate depletion  Patellar Region Mild depletion  Anterior Thigh Region Mild depletion  Posterior Calf Region Mild depletion  Edema (RD Assessment) None  Hair Reviewed  Eyes Reviewed  Mouth Reviewed  Skin Reviewed  Nails Reviewed       Diet Order:   Diet Order             DIET - DYS 1 Room service appropriate? No; Fluid consistency: Thin  Diet effective now                   EDUCATION NEEDS:   No education needs have been identified at this time  Skin:  Skin Assessment: Skin Integrity Issues: Skin Integrity Issues:: Other (Comment) Other: skin tears to lt lower arm, rt elbow; lacerations to nose and lt upper eye  Last BM:  Unknown  Height:   Ht Readings from Last 1 Encounters:  05/12/21 '5\' 10"'$  (1.778 m)    Weight:   Wt Readings from Last 1 Encounters:  05/12/21 79.4 kg    Ideal Body Weight:  75.5 kg  BMI:  There is no height or weight on file to calculate BMI.  Estimated Nutritional Needs:   Kcal:  2100-2300  Protein:  110-125  grams  Fluid:  > 2 L    Loistine Chance, RD, LDN, [place] Registered Dietitian II Certified Diabetes Care and Education Specialist Please refer to [facility] for RD and/or RD on-call/weekend/after hours pager

## 2021-06-30 NOTE — Evaluation (Signed)
Physical Therapy Evaluation Patient Details Name: Derrick Cochran MRN: [id number] DOB: 08/03/1935 Today's Date: 06/30/2021  History of Present Illness  Pt is an 86 y.o. male presenting to hospital with concerns for multiple falls.  Of note pt hospitalized from 05/12/21-05/15/21 for SDH secondary to fall.  Pt now admitted with AKI, hypothyroidism, HLD, protein calorie malnutrition, facial laceration secondary to fall, and AMS.  Pt noted with acute nasal bone fx's.  No acute CT findings of head.   PMH includes a-fib, recent SDH secondary to fall, depression, anxiety, BPH, HLD, NIDDM, hypothyroidism, insomnia, anxiety, AAA, AVR, and CABG.  Clinical Impression  Prior to hospital admission, per chart review pt was recently ambulatory with rollator (wife providing supervision) and lives his wife and adult son.  Pt only able to state name and DOB during session; increased time required to follow 1 step cues.  Currently pt is min assist with bed mobility; min to mod assist with transfers using RW; and min assist to take a couple steps to R along bed with RW (pt then assisted back to bed d/t staff needing pt in bed for pt care--OT present with plan to get pt OOB to chair once staff finished with pt care in bed).  Pt would benefit from skilled PT to address noted impairments and functional limitations (see below for any additional details).  Upon hospital discharge, pt would benefit from SNF; if pt has 24/7 physical assist at home pt may be able to discharge home instead with HHPT (although pt may need to be w/c level initially d/t noted recent falls).    Recommendations for follow up therapy are one component of a multi-disciplinary discharge planning process, led by the attending physician.  Recommendations may be updated based on patient status, additional functional criteria and insurance authorization.  Follow Up Recommendations Skilled nursing-short term rehab (<3 hours/day)    Assistance Recommended at  Discharge Frequent or constant Supervision/Assistance  Patient can return home with the following  A lot of help with walking and/or transfers;A lot of help with bathing/dressing/bathroom;Assistance with cooking/housework;Direct supervision/assist for medications management;Assist for transportation;Help with stairs or ramp for entrance    Equipment Recommendations Rolling walker (2 wheels);BSC/3in1;Wheelchair (measurements PT);Wheelchair cushion (measurements PT)  Recommendations for Other Services  OT consult    Functional Status Assessment Patient has had a recent decline in their functional status and demonstrates the ability to make significant improvements in function in a reasonable and predictable amount of time.     Precautions / Restrictions Precautions Precautions: Fall Precaution Comments: Aspiration Restrictions Weight Bearing Restrictions: No      Mobility  Bed Mobility Overal bed mobility: Needs Assistance Bed Mobility: Supine to Sit, Sit to Supine     Supine to sit: Min assist Sit to supine: Min assist   General bed mobility comments: minimal assist for trunk; vc's for technique    Transfers Overall transfer level: Needs assistance Equipment used: Rolling walker (2 wheels) Transfers: Sit to/from Stand Sit to Stand: Min assist, Mod assist           General transfer comment: vc's for UE and LE placement; assist to initiate stand up to walker and control descent sitting    Ambulation/Gait Ambulation/Gait assistance: Min assist Gait Distance (Feet):  (pt took sidesteps to R a couple feet (along bed) with min assist) Assistive device: Rolling walker (2 wheels)   Gait velocity: decreased     General Gait Details: vc's for activity; increased time to process/initiate  Stairs  Wheelchair Mobility    Modified Rankin (Stroke Patients Only)       Balance Overall balance assessment: Needs assistance, History of Falls Sitting-balance  support: No upper extremity supported, Feet supported Sitting balance-Leahy Scale: Good Sitting balance - Comments: steady sitting reaching within BOS   Standing balance support: Bilateral upper extremity supported, During functional activity, Reliant on assistive device for balance Standing balance-Leahy Scale: Poor Standing balance comment: assist required for standing balance                             Pertinent Vitals/Pain Pain Assessment Pain Assessment: Faces Pain Location: nose Pain Descriptors / Indicators: Discomfort Pain Intervention(s): Limited activity within patient's tolerance, Monitored during session Vitals (HR and O2 on room air) stable and WFL throughout treatment session.    Home Living Family/patient expects to be discharged to:: Private residence Living Arrangements: Spouse/significant other Available Help at Discharge: Family;Available 24 hours/day (Per chart review lives with wife and 22 y.o. son) Type of Home: Mobile home Home Access: Ramped entrance       Home Layout: One level Home Equipment: Doyle (4 wheels);Shower seat;Wheelchair - manual Additional Comments: Home set up and DME and PLOF per previous hospitalization about 1 month ago (no family present during session)    Prior Function Prior Level of Function : Needs assist             Mobility Comments: Ambulatory with rollator with wife providing supervision ADLs Comments: Per OT "supervision- MIN A with all ADL from wife"     Hand Dominance   Dominant Hand: Right    Extremity/Trunk Assessment   Upper Extremity Assessment Upper Extremity Assessment: Generalized weakness;Defer to OT evaluation    Lower Extremity Assessment Lower Extremity Assessment: Generalized weakness    Cervical / Trunk Assessment Cervical / Trunk Assessment: Other exceptions Cervical / Trunk Exceptions: forward head/shoulders  Communication   Communication: No difficulties  Cognition  Arousal/Alertness:  (pt intermittently closing his eyes laying in bed but woken with vc's) Behavior During Therapy: Flat affect Overall Cognitive Status: No family/caregiver present to determine baseline cognitive functioning                                 General Comments: Increased time to follow 1 step cues; h/o cognitive impairments        General Comments  Nursing cleared pt for participation in physical therapy.  Pt agreeable to PT session.    Exercises  Transfer training   Assessment/Plan    PT Assessment Patient needs continued PT services  PT Problem List Decreased strength;Decreased activity tolerance;Decreased balance;Decreased mobility;Decreased knowledge of use of DME       PT Treatment Interventions DME instruction;Gait training;Functional mobility training;Therapeutic activities;Therapeutic exercise;Balance training;Patient/family education    PT Goals (Current goals can be found in the Care Plan section)  Acute Rehab PT Goals Patient Stated Goal: to improve mobility PT Goal Formulation: With patient Time For Goal Achievement: 07/14/21 Potential to Achieve Goals: Fair    Frequency Min 2X/week     Co-evaluation               AM-PAC PT "6 Clicks" Mobility  Outcome Measure Help needed turning from your back to your side while in a flat bed without using bedrails?: None Help needed moving from lying on your back to sitting on the side of a flat bed without  using bedrails?: A Little Help needed moving to and from a bed to a chair (including a wheelchair)?: A Little Help needed standing up from a chair using your arms (e.g., wheelchair or bedside chair)?: A Lot Help needed to walk in hospital room?: A Lot Help needed climbing 3-5 steps with a railing? : Total 6 Click Score: 15    End of Session Equipment Utilized During Treatment: Gait belt Activity Tolerance: Patient tolerated treatment well Patient left: in bed;with call bell/phone  within reach;with bed alarm set;Other (comment) (OT present for OT session) Nurse Communication: Mobility status;Precautions PT Visit Diagnosis: Unsteadiness on feet (R26.81);Repeated falls (R29.6);Muscle weakness (generalized) (M62.81);History of falling (Z91.81);Other abnormalities of gait and mobility (R26.89)    Time: 9381-0175 PT Time Calculation (min) (ACUTE ONLY): 10 min   Charges:   PT Evaluation $PT Eval Low Complexity: 1 Low         Wallace Gappa, PT 06/30/21, 4:14 PM

## 2021-06-30 NOTE — Evaluation (Signed)
Clinical/Bedside Swallow Evaluation Patient Details  Name: Derrick Cochran MRN: [id number] Date of Birth: 1935-07-20  Today's Date: 06/30/2021 Time: SLP Start Time (ACUTE ONLY): 46 SLP Stop Time (ACUTE ONLY): 1884 SLP Time Calculation (min) (ACUTE ONLY): 10 min  Past Medical History:  Past Medical History:  Diagnosis Date   AAA (abdominal aortic aneurysm) ([place])    Arrhythmia    atrial fibrillation   Arthritis    BPH (benign prostatic hyperplasia)    Cancer (HCC)    skin   CHF (congestive heart failure) (HCC)    Coronary artery disease    DDD (degenerative disc disease), thoracic    Diabetes mellitus without complication (HCC)    Esophageal stricture    GERD (gastroesophageal reflux disease)    Heart murmur    Hyperlipidemia    Hypertension    Hypothyroidism    Myocardial infarction ([place])    Presence of permanent cardiac pacemaker    Shortness of breath dyspnea    Past Surgical History:  Past Surgical History:  Procedure Laterality Date   AORTIC VALVE REPLACEMENT  1995   Bioprosthetic - [place] x 2 with PCTA of RCA   CARDIAC VALVE REPLACEMENT     Bovine transcatheter heart valve   COLONOSCOPY  2015   CORONARY ARTERY BYPASS GRAFT  1995   x 4 Vessels - [facility]   ELECTROPHYSIOLOGIC STUDY N/A 08/06/2014   Procedure: Cardioversion;  Surgeon: [name], MD;  Location: [facility];  Service: Cardiovascular;  Laterality: N/A;   ELECTROPHYSIOLOGIC STUDY N/A 06/18/2014   Procedure: CARDIOVERSION;  Surgeon: [name], MD;  Location: [facility];  Service: Cardiovascular;  Laterality: N/A;   ESOPHAGOGASTRODUODENOSCOPY  2014   EYE SURGERY     GREAT TOE ARTHRODESIS, INTERPHALANGEAL JOINT Left    HERNIA REPAIR Left    Inguinal   IR RADIOLOGIST EVAL & MGMT  11/04/2019   PACEMAKER INSERTION N/A 07/02/2014   Procedure: INSERTION PACEMAKER/DUAL CHAMBER  INITIAL IMPLANT;  Surgeon: [name], MD;  Location:  [facility];  Service: Cardiovascular;  Laterality: N/A;   SKIN BIOPSY     HPI:  Per H&P "Mr. Mory Herrman is a 86 year old male with atrial fibrillation, previously on Coumadin, recent subdural hematoma secondary to fall, depression, anxiety, BPH, hyperlipidemia, non-insulin-dependent diabetes mellitus, hypothyroid, insomnia, anxiety, who presents emergency department for chief concerns of multiple falls.    Initial vitals in the emergency department show temperature of 97.7, respiration rate of 18, heart rate of 77, blood pressure 107/62, SPO2 of 98% on room air.    Serum sodium is 141, potassium 4.8, chloride 110, bicarb 21, BUN of 47, serum creatinine of 1.63, GFR 41, nonfasting blood glucose 224, WBC 6, hemoglobin 11.1, platelets of 64.     ED treatment: Azithromycin 500 mg IV, ceftriaxone 1 g IV.     At bedside patient was able to tell me his name, his age.  He states the current year is 36 and then he corrected himself and states in 1974.  He states that the current president of the [place] is Doctor, general practice when given multiple choice of Clinton, Bush, Biden, Trump, [place].     Per his spouse, he saw Dr. Doy Hutching and [place] outpatient after discharge and they have not resumed patient's Coumadin stating that they are pending reevaluation from outpatient neurosurgeon.  Patient has an outpatient appointment with Dr. Cari Caraway for 06/30/2021.     Per his wife, patient has been  having poor appetite since discharge from hospital. He has been drinking boost, Vanilla however overall poor p.o. intake.     On my second evaluation of patient to the floor, I attempted to irrigate the lacerations on his face and repair with Dermabond and Steri-Strips.  Patient swipes at my hand stating that he wants to be left alone and wants to go to sleep.  He states that the solution I am using for to irrigate is too cold and he wants warmed water.  His agitation is onset due to my attempts at repairing the lacerations on his wound.      Social history: He lives with wife of nine years.  She states that he is not a tobacco user, no EtOH, or drug use.     ROS: Unable to complete due to mental status changes with possible baseline dementia previously undiagnosed."    Assessment / Plan / Recommendation  Clinical Impression  Pt seen for clinical swallowing evaluation. Pt lethargic, able to rouse for POs. On room air. Confusion evident.  Pt unable to follow commands for full participation in oral motor examination. No obvious facial/lingual  weakness/incoordination noted.   Pt given trials of solid, pureed, and thin liquids (via straw). Pt presents with s/sx moderate oral dysphagia c/b oral holding and inability to masticate solids. Oral deficits likely due to overall mental status. Pharyngeal swallow appeared [facility] per clinical assessment. Seemingly timely swallow initiation and seemingly adequate laryngeal elevation to palpation. No overt or subtle s/sx pharyngeal dysphagia. No change to vocal quality noted.   Pt is at increased risk for aspiration/aspiration PNA given AMS, lethargy, multiple medical comorbidities, and dependence for feeding at present.   Recommend cautious initiation of a pureed diet with thin liquids and safe swallowing strategies/aspiration precautions as outlined below.   SLP to f/u per POC for diet tolerance and trials of upgraded textures.   RN made aware of results, recommendations, and SLP POC.   Pt left in the care of PT, Raquel Sarna.  SLP Visit Diagnosis: Dysphagia, oral phase (R13.11)    Aspiration Risk  Mild aspiration risk;Risk for inadequate nutrition/hydration    Diet Recommendation Dysphagia 1 (Puree);Thin liquid   Medication Administration: Crushed with puree Supervision: Full supervision/cueing for compensatory strategies;Staff to assist with self feeding Compensations: Minimize environmental distractions;Slow rate;Small sips/bites Postural Changes: Seated upright at 90 degrees;Remain upright  for at least 30 minutes after po intake    Other  Recommendations Oral Care Recommendations: Oral care QID;Staff/trained caregiver to provide oral care    Recommendations for follow up therapy are one component of a multi-disciplinary discharge planning process, led by the attending physician.  Recommendations may be updated based on patient status, additional functional criteria and insurance authorization.  Follow up Recommendations Skilled nursing-short term rehab (<3 hours/day)      Assistance Recommended at Discharge Frequent or constant Supervision/Assistance  Functional Status Assessment Patient has had a recent decline in their functional status and demonstrates the ability to make significant improvements in function in a reasonable and predictable amount of time.  Frequency and Duration min 2x/week  2 weeks       Prognosis Prognosis for Safe Diet Advancement: Fair Barriers to Reach Goals: Cognitive deficits;Severity of deficits;Behavior      Swallow Study   General Date of Onset: 06/23/2021 HPI: Per H&P "Mr. Chaka Jefferys is a 86 year old male with atrial fibrillation, previously on Coumadin, recent subdural hematoma secondary to fall, depression, anxiety, BPH, hyperlipidemia, non-insulin-dependent diabetes mellitus, hypothyroid, insomnia, anxiety, who  presents emergency department for chief concerns of multiple falls.    Initial vitals in the emergency department show temperature of 97.7, respiration rate of 18, heart rate of 77, blood pressure 107/62, SPO2 of 98% on room air.    Serum sodium is 141, potassium 4.8, chloride 110, bicarb 21, BUN of 47, serum creatinine of 1.63, GFR 41, nonfasting blood glucose 224, WBC 6, hemoglobin 11.1, platelets of 64.     ED treatment: Azithromycin 500 mg IV, ceftriaxone 1 g IV.     At bedside patient was able to tell me his name, his age.  He states the current year is 56 and then he corrected himself and states in 1974.  He states that the  current president of the [place] is Doctor, general practice when given multiple choice of Clinton, Bush, Biden, Trump, [place].     Per his spouse, he saw Dr. Doy Hutching and [place] outpatient after discharge and they have not resumed patient's Coumadin stating that they are pending reevaluation from outpatient neurosurgeon.  Patient has an outpatient appointment with Dr. Cari Caraway for 06/30/2021.     Per his wife, patient has been having poor appetite since discharge from hospital. He has been drinking boost, Vanilla however overall poor p.o. intake.     On my second evaluation of patient to the floor, I attempted to irrigate the lacerations on his face and repair with Dermabond and Steri-Strips.  Patient swipes at my hand stating that he wants to be left alone and wants to go to sleep.  He states that the solution I am using for to irrigate is too cold and he wants warmed water.  His agitation is onset due to my attempts at repairing the lacerations on his wound.     Social history: He lives with wife of nine years.  She states that he is not a tobacco user, no EtOH, or drug use.     ROS: Unable to complete due to mental status changes with possible baseline dementia previously undiagnosed." Type of Study: Bedside Swallow Evaluation Previous Swallow Assessment: unknown Diet Prior to this Study: NPO Respiratory Status: Room air History of Recent Intubation: No Behavior/Cognition: Lethargic/Drowsy;Doesn't follow directions;Confused Oral Cavity Assessment: Within Functional Limits Oral Care Completed by SLP: Yes Oral Cavity - Dentition: Adequate natural dentition Vision:  (pt unable to feed self; did not appear vision-related) Self-Feeding Abilities: Total assist Patient Positioning: Upright in bed Baseline Vocal Quality: Normal Volitional Cough: Cognitively unable to elicit Volitional Swallow: Unable to elicit    Oral/Motor/Sensory Function Overall Oral Motor/Sensory Function:  (unable to follow commands for  participation in oral motor examination; no obvious weakness/asymmetry noted)   Ice Chips     Thin Liquid Thin Liquid: Within functional limits Presentation: Straw Other Comments: ~4 oz    Puree Puree: Within functional limits Presentation: Spoon Other Comments: ~2 oz   Solid     Solid: Impaired Presentation: Spoon Oral Phase Impairments: Poor awareness of bolus;Impaired mastication Oral Phase Functional Implications: Oral holding (cued expectorating) Pharyngeal Phase Impairments:  (unable to assess)     Cherrie Gauze, M.S., [facility] [phone] (ASCOM)   Clearnce Sorrel Covey Baller 06/30/2021,1:28 PM

## 2021-06-30 NOTE — Assessment & Plan Note (Addendum)
-   Levothyroxine 100 mcg daily resumed, discontinued for comfort care

## 2021-06-30 NOTE — Assessment & Plan Note (Addendum)
-   Resumed home simvastatin 40 mg nightly, discontinued for comfort care

## 2021-06-30 NOTE — Assessment & Plan Note (Addendum)
-   Secondary to fall.

## 2021-06-30 NOTE — Assessment & Plan Note (Addendum)
Given normal CRP, positive test 6 weeks ago, patient is still likely just testing positive from previous COVID infection.  His hypoxia was only started on the night of 5/18 is due to heart failure, not COVID.  No need for Paxlovid.

## 2021-06-30 NOTE — Assessment & Plan Note (Addendum)
Echocardiogram done last year in chart review notes an EF of less than 15%.  Patient has end-stage heart failure.  His p.o. Lasix had been recently stopped.  He is now in florid heart failure with cardiorenal syndrome and limited options and attempts to diurese.  We will try very light doses of IV Lasix in hopes that it does not bottom out his blood pressure and see if we can offload his volume.  If this does not work, he likely needs to be made comfort care.  I discussed this with the patient's wife who understands how gravely ill he is and agrees with this plan.  She reaffirms that he is to be a DNR.

## 2021-06-30 NOTE — Assessment & Plan Note (Signed)
Nutrition Status: Nutrition Problem: Severe Malnutrition Etiology: chronic illness (SDH) Signs/Symptoms: severe fat depletion, moderate fat depletion, moderate muscle depletion, severe muscle depletion Interventions: Ensure Enlive (each supplement provides 350kcal and 20 grams of protein), MVI, Prostat

## 2021-06-30 NOTE — Assessment & Plan Note (Signed)
Chronic.  Rate controlled.  No longer on Coumadin.

## 2021-07-01 DIAGNOSIS — N179 Acute kidney failure, unspecified: Secondary | ICD-10-CM | POA: Diagnosis not present

## 2021-07-01 DIAGNOSIS — I5023 Acute on chronic systolic (congestive) heart failure: Secondary | ICD-10-CM | POA: Diagnosis not present

## 2021-07-01 DIAGNOSIS — I482 Chronic atrial fibrillation, unspecified: Secondary | ICD-10-CM | POA: Diagnosis not present

## 2021-07-01 DIAGNOSIS — J9601 Acute respiratory failure with hypoxia: Secondary | ICD-10-CM | POA: Diagnosis not present

## 2021-07-01 LAB — GLUCOSE, CAPILLARY
Glucose-Capillary: 136 mg/dL — ABNORMAL HIGH (ref 70–99)
Glucose-Capillary: 139 mg/dL — ABNORMAL HIGH (ref 70–99)
Glucose-Capillary: 163 mg/dL — ABNORMAL HIGH (ref 70–99)

## 2021-07-01 LAB — BASIC METABOLIC PANEL
Anion gap: 13 (ref 5–15)
BUN: 61 mg/dL — ABNORMAL HIGH (ref 8–23)
CO2: 21 mmol/L — ABNORMAL LOW (ref 22–32)
Calcium: 8.1 mg/dL — ABNORMAL LOW (ref 8.9–10.3)
Chloride: 109 mmol/L (ref 98–111)
Creatinine, Ser: 1.75 mg/dL — ABNORMAL HIGH (ref 0.61–1.24)
GFR, Estimated: 38 mL/min — ABNORMAL LOW (ref >60)
Glucose, Bld: 182 mg/dL — ABNORMAL HIGH (ref 70–99)
Potassium: 5 mmol/L (ref 3.5–5.1)
Sodium: 143 mmol/L (ref 135–145)

## 2021-07-01 LAB — T3, FREE: T3, Free: 0.8 pg/mL — ABNORMAL LOW (ref 2.0–4.4)

## 2021-07-01 LAB — LACTIC ACID, PLASMA: Lactic Acid, Venous: 3 mmol/L (ref 0.5–1.9)

## 2021-07-01 LAB — TROPONIN I (HIGH SENSITIVITY): Troponin I (High Sensitivity): 62 ng/L — ABNORMAL HIGH (ref <18)

## 2021-07-01 MED ORDER — NIRMATRELVIR/RITONAVIR (PAXLOVID) TABLET (RENAL DOSING)
2.0000 | ORAL_TABLET | Freq: Two times a day (BID) | ORAL | Status: DC
  Administered 2021-07-01: 2 via ORAL
  Filled 2021-07-01: qty 20

## 2021-07-01 MED ORDER — MORPHINE 100MG IN NS 100ML (1MG/ML) PREMIX INFUSION
1.0000 mg/h | INTRAVENOUS | Status: DC
  Administered 2021-07-01: 1 mg/h via INTRAVENOUS
  Filled 2021-07-01: qty 100

## 2021-07-01 MED ORDER — FUROSEMIDE 10 MG/ML IJ SOLN
10.0000 mg | Freq: Once | INTRAMUSCULAR | Status: AC
  Administered 2021-07-01: 10 mg via INTRAVENOUS
  Filled 2021-07-01: qty 2

## 2021-07-01 MED ORDER — FUROSEMIDE 10 MG/ML IJ SOLN
20.0000 mg | Freq: Two times a day (BID) | INTRAMUSCULAR | Status: DC
  Filled 2021-07-01: qty 2

## 2021-07-01 MED ORDER — SIMVASTATIN 20 MG PO TABS
40.0000 mg | ORAL_TABLET | Freq: Every evening | ORAL | Status: DC
Start: 2021-07-06 — End: 2021-07-01

## 2021-07-01 MED ORDER — FUROSEMIDE 10 MG/ML IJ SOLN: 20.0000 mg | Freq: Two times a day (BID) | INTRAMUSCULAR | Status: DC

## 2021-07-01 MED ORDER — FUROSEMIDE 10 MG/ML IJ SOLN
10.0000 mg | Freq: Once | INTRAMUSCULAR | Status: AC
  Administered 2021-07-01: 10 mg via INTRAVENOUS

## 2021-07-01 NOTE — TOC CM/SW Note (Signed)
Left voicemail for wife. Will discuss SNF recommendations when she calls back. Patient on airborne precautions for positive COVID test but also tested positive 6 weeks ago.  Dayton Scrape, [place]

## 2021-07-01 NOTE — Assessment & Plan Note (Signed)
Was not initially hypoxic on admission.  This is secondary to fluid administration.  2 L oxygen for comfort

## 2021-07-01 NOTE — Progress Notes (Signed)
Critical lab value of lactic acid 3.0 notified to Dr. [place] Pink [phone].

## 2021-07-01 NOTE — Consult Note (Signed)
[place] NOTE       Patient ID: Derrick Cochran MRN: [id number] DOB/AGE: 84-May-1937 84 y.o.  Admit date: 07/02/2021 Referring Physician Dr. Gevena Barre Primary Physician Dr. Felipa Furnace Primary Cardiologist Dr. [name] Reason for Consultation acute on chronic HFrEF  HPI: Derrick Cochran is an 43yoM with a PMH of CABG x4 in 10/1993 (cardiac cath 11/2013 showing patent LIMA-LAD, SVG-D1 and OM 1), ischemic cardiomyopathy (LVEF <15% 07/2019), chronic atrial fibrillation previously on warfarin (d/c due to falls and recent SDH), sick sinus syndrome s/p DC PPM 06/2014, aortic stenosis s/p TAVR 03/2014, hyperlipidemia, type 2 diabetes, hypothyroidism, memory impairment who presented to [facility] ED [facility]/13/2021 after multiple falls and fractured his nose.  He tested positive for COVID 6 weeks ago and remains positive on admission. Cardiology is consulted for assistance with diuresis.   At my time of evaluation no family is present and the patient is lying in bed attempting to remove his hospital gown, his right safety mitts is hanging off of his wrist.  He is oriented to self only but believes he is at home and that there is a person sitting in the recliner at bedside although there is nobody present.  He lets me replace his safety mitts and cover him with a blanket.  When asked if anything is bothering him he says "I want all this shit off of me" but then closes his eyes and falls asleep.  History obtained from the chart and nursing staff.  He was admitted with multiple falls and an acute kidney injury, was not hypoxic on admission.  Started on IV antibiotics for an IV fluids for COVID.  Last night the patient was tachypneic and then became hypoxic with O2 sat 87% on room air, improved with 2 L by nasal cannula.  BNP checked overnight was elevated to over 4500.  Per nursing, it has been difficult to administer medications and maintain Ivs /condom catheter with the patient due  to agitation.  He was also hypotensive in the 100s to high 90s.   Recent labs are notable for a BNP greater than 4500, BUN/creatinine 61/1.75, GFR 38.  Lactate 3.0.  CBC checked on 5/17, without leukocytosis, H&H 11/35 with thrombocytopenia with platelets 64 (around baseline)  Chest x-ray with stable upper lobe predominant pulmonary infiltrates likely representing infection, without pneumothorax or pleural effusion or pulmonary vascular congestion.   Review of systems unable to assess due to dementia   Past Medical History:  Diagnosis Date   AAA (abdominal aortic aneurysm) ([place])    Arrhythmia    atrial fibrillation   Arthritis    BPH (benign prostatic hyperplasia)    Cancer (HCC)    skin   CHF (congestive heart failure) ([place])    Coronary artery disease    DDD (degenerative disc disease), thoracic    Diabetes mellitus without complication (HCC)    Esophageal stricture    GERD (gastroesophageal reflux disease)    Heart murmur    Hyperlipidemia    Hypertension    Hypothyroidism    Myocardial infarction ([place])    Presence of permanent cardiac pacemaker    Shortness of breath dyspnea     Past Surgical History:  Procedure Laterality Date   AORTIC VALVE REPLACEMENT  1995   Bioprosthetic - [place] x 2 with PCTA of RCA   CARDIAC VALVE REPLACEMENT     Bovine transcatheter heart valve   COLONOSCOPY  2015  CORONARY ARTERY BYPASS GRAFT  1995   x 4 Vessels - [facility]   ELECTROPHYSIOLOGIC STUDY N/A 08/06/2014   Procedure: Cardioversion;  Surgeon: [name], MD;  Location: [facility];  Service: Cardiovascular;  Laterality: N/A;   ELECTROPHYSIOLOGIC STUDY N/A 06/18/2014   Procedure: CARDIOVERSION;  Surgeon: [name], MD;  Location: [facility];  Service: Cardiovascular;  Laterality: N/A;   ESOPHAGOGASTRODUODENOSCOPY  2014   EYE SURGERY     GREAT TOE ARTHRODESIS, INTERPHALANGEAL JOINT Left    HERNIA REPAIR Left     Inguinal   IR RADIOLOGIST EVAL & MGMT  11/04/2019   PACEMAKER INSERTION N/A 07/02/2014   Procedure: INSERTION PACEMAKER/DUAL CHAMBER  INITIAL IMPLANT;  Surgeon: [name], MD;  Location: [facility] ORS;  Service: Cardiovascular;  Laterality: N/A;   SKIN BIOPSY      Medications Prior to Admission  Medication Sig Dispense Refill Last Dose   acetaminophen (TYLENOL) 500 MG tablet Take 500 mg by mouth every 6 (six) hours as needed for mild pain, moderate pain or fever.    prn at prn   DULoxetine (CYMBALTA) 20 MG capsule Take 20 mg by mouth daily.   Past Week   haloperidol (HALDOL) 0.5 MG tablet Take 0.5 mg by mouth 2 (two) times daily as needed.   prn at prn   levocetirizine (XYZAL) 5 MG tablet Take 5 mg by mouth every evening.   Past Week   levothyroxine (SYNTHROID) 100 MCG tablet Take 100 mcg by mouth daily.   Past Week   metoprolol succinate (TOPROL-XL) 50 MG 24 hr tablet Take 50 mg by mouth daily.    Past Week   omeprazole (PRILOSEC) 20 MG capsule Take 20 mg by mouth daily.    Past Week   simvastatin (ZOCOR) 40 MG tablet Take 40 mg by mouth every evening.    Past Week   temazepam (RESTORIL) 30 MG capsule Take 30-60 mg by mouth at bedtime as needed for sleep.    Past Week   tiZANidine (ZANAFLEX) 2 MG tablet Take 1 tablet by mouth 2 (two) times daily.   Past Week   vitamin B-12 (CYANOCOBALAMIN) 1000 MCG tablet Take 1,000 mcg by mouth daily.   Past Week   metFORMIN (GLUCOPHAGE) 500 MG tablet Take 500 mg by mouth 2 (two) times daily.  (Patient not taking: Reported on 06/30/2021)   Not Taking   nitroGLYCERIN (NITROSTAT) 0.4 MG SL tablet Place 0.4 mg under the tongue every 5 (five) minutes as needed for chest pain.   prn at prn   tamsulosin (FLOMAX) 0.4 MG CAPS capsule Take 0.4 mg by mouth every morning.  (Patient not taking: Reported on 06/30/2021)   Not Taking   warfarin (COUMADIN) 1 MG tablet Hold until followup with outpatient cardiology due to brain bleed. (Patient not taking: Reported on  06/30/2021)   Not Taking   warfarin (COUMADIN) 3 MG tablet Hold until followup with outpatient cardiology due to brain bleed. (Patient not taking: Reported on 06/30/2021)   Not Taking   Social History   Socioeconomic History   Marital status: Married    Spouse name: Not on file   Number of children: Not on file   Years of education: Not on file   Highest education level: Not on file  Occupational History   Occupation: retired  Tobacco Use   Smoking status: Former    Packs/day: 0.50    Years: 15.00    Pack years: 7.50    Types: Cigarettes    Quit  date: 04/22/1985    Years since quitting: 36.2   Smokeless tobacco: Former    Types: Nurse, children's Use: Never used  Substance and Sexual Activity   Alcohol use: No   Drug use: No   Sexual activity: Not Currently  Other Topics Concern   Not on file  Social History Narrative   Not on file   Social Determinants of Health   Financial Resource Strain: Not on file  Food Insecurity: Not on file  Transportation Needs: Not on file  Physical Activity: Not on file  Stress: Not on file  Social Connections: Not on file  Intimate Partner Violence: Not on file    Family History  Problem Relation Age of Onset   Cancer Mother    Lung cancer Father    Kidney disease Sister    Heart failure Brother      PHYSICAL EXAM General: Cachectic elderly Caucasian male, in no acute distress.  Lying in hospital bed without family at bedside, disheveled appearing with hospital gown and blanket pulled off. HEENT:  Normocephalic and atraumatic. Neck:  No JVD.  Lungs: Shallow breathing on 2 L by nasal cannula, trace crackles left base, difficult to auscultate posteriorly due to patient noncompliance. Heart: Irregularly irregular.  Systolic murmur present  abdomen: Non-distended appearing.  Msk: Normal strength and tone for age. Extremities: Warm and well perfused. No clubbing, cyanosis.  No peripheral edema.  Neuro: Alert to self  only. Psych: Pleasantly confused.  Labs:   Lab Results  Component Value Date   WBC 6.0 07/06/2021   HGB 11.1 (L) 06/15/2021   HCT 35.2 (L) 07/06/2021   MCV 110.0 (H) 07/13/2021   PLT 64 (L) 06/28/2021    Recent Labs  Lab 06/16/2021 1902 06/30/21 0826 07/01/21 0412  NA 141   < > 143  K 4.8   < > 5.0  CL 110   < > 109  CO2 21*   < > 21*  BUN 47*   < > 61*  CREATININE 1.63*   < > 1.75*  CALCIUM 8.3*   < > 8.1*  PROT 7.3  --   --   BILITOT 1.4*  --   --   ALKPHOS 56  --   --   ALT 15  --   --   AST 30  --   --   GLUCOSE 224*   < > 182*   < > = values in this interval not displayed.   Lab Results  Component Value Date   TROPONINI <0.03 09/23/2017   No results found for: CHOL No results found for: HDL No results found for: LDLCALC No results found for: TRIG No results found for: CHOLHDL No results found for: LDLDIRECT    Radiology: DG Chest 1 View  Result Date: 06/28/2021 CLINICAL DATA:  Unwitnessed fall. EXAM: CHEST  1 VIEW COMPARISON:  May 12, 2021. FINDINGS: Stable cardiomediastinal silhouette. Status post coronary bypass graft. Status post transcatheter aortic valve repair. Left-sided pacemaker is unchanged. Left basilar atelectasis is noted with probable small left pleural effusion. Right upper lobe airspace opacity is noted concerning for pneumonia. Mild right basilar atelectasis or infiltrate is noted as well. Bony thorax is unremarkable. IMPRESSION: Left basilar atelectasis is noted with probable small left pleural effusion. Right upper and lower lobe airspace opacities are noted concerning for pneumonia or possibly atelectasis. Electronically Signed   By: Marijo Conception M.D.   On: 07/05/2021 19:49   CT HEAD  WO CONTRAST (5MM)  Result Date: 07/01/2021 CLINICAL DATA:  Unwitnessed fall. EXAM: CT HEAD WITHOUT CONTRAST CT CERVICAL SPINE WITHOUT CONTRAST TECHNIQUE: Multidetector CT imaging of the head and cervical spine was performed following the standard protocol  without intravenous contrast. Multiplanar CT image reconstructions of the cervical spine were also generated. RADIATION DOSE REDUCTION: This exam was performed according to the departmental dose-optimization program which includes automated exposure control, adjustment of the mA and/or kV according to patient size and/or use of iterative reconstruction technique. COMPARISON:  Head CT 06/27/2021 and cervical spine CT 05/12/2021 FINDINGS: CT HEAD FINDINGS Brain: Stable age related cerebral atrophy, ventriculomegaly and periventricular white matter disease. No extra-axial fluid collections are identified. No CT findings for acute hemispheric infarction or intracranial hemorrhage. No mass lesions. The brainstem and cerebellum are normal. Vascular: Stable vascular calcifications but no aneurysm hyperdense vessels. Skull: No skull fracture is identified. Acute nasal bone fractures suspected. Sinuses/Orbits: Scattered paranasal sinus disease. The mastoid air cells and middle ear cavities are clear. The globes are intact. Other: No scalp lesions.  Suspect small frontal scalp hematoma. CT CERVICAL SPINE FINDINGS Alignment: Normal Skull base and vertebrae: No acute cervical spine fracture is identified. The skull base C1 and C1-2 articulations are maintained. Soft tissues and spinal canal: Sounds choose 1 Disc levels: Stable advanced degenerative cervical spondylosis with multilevel disc disease and facet disease. Upper chest: The lung apices demonstrate chronic scarring changes and a left pleural effusion. Other: None IMPRESSION: 1. Stable age related cerebral atrophy, ventriculomegaly and periventricular white matter disease. 2. No acute intracranial findings. 3. Acute nasal bone fractures. 4. Normal alignment of the cervical spine and no acute cervical spine fracture. 5. Stable advanced degenerative cervical spondylosis. 6. Left pleural effusion. Electronically Signed   By: Marijo Sanes M.D.   On: 07/04/2021 20:11   CT  HEAD WO CONTRAST (5MM)  Result Date: 06/28/2021 CLINICAL DATA:  Intracranial hemorrhage EXAM: CT HEAD WITHOUT CONTRAST TECHNIQUE: Contiguous axial images were obtained from the base of the skull through the vertex without intravenous contrast. RADIATION DOSE REDUCTION: This exam was performed according to the departmental dose-optimization program which includes automated exposure control, adjustment of the mA and/or kV according to patient size and/or use of iterative reconstruction technique. COMPARISON:  CT head May 14, 2021. FINDINGS: Brain: Previously noted parafalcine subdural blood and intraventricular hemorrhage is essentially resolved. No evidence of new hemorrhage, acute large vascular territory infarct, mass lesion, midline shift, hydrocephalus. Remote lacunar infarcts in the cerebellum. Cerebral atrophy. Vascular: Calcific intracranial atherosclerosis. No hyperdense vessel identified. Skull: No acute fracture. Sinuses/Orbits: Largely clear sinuses.  No acute orbital findings. Other: No mastoid effusions. IMPRESSION: Previously noted parafalcine subdural and intraventricular hemorrhage is essentially resolved. No new acute intracranial abnormality Electronically Signed   By: Margaretha Sheffield M.D.   On: 06/28/2021 13:55   CT Cervical Spine Wo Contrast  Result Date: 07/07/2021 CLINICAL DATA:  Unwitnessed fall. EXAM: CT HEAD WITHOUT CONTRAST CT CERVICAL SPINE WITHOUT CONTRAST TECHNIQUE: Multidetector CT imaging of the head and cervical spine was performed following the standard protocol without intravenous contrast. Multiplanar CT image reconstructions of the cervical spine were also generated. RADIATION DOSE REDUCTION: This exam was performed according to the departmental dose-optimization program which includes automated exposure control, adjustment of the mA and/or kV according to patient size and/or use of iterative reconstruction technique. COMPARISON:  Head CT 06/27/2021 and cervical spine CT  05/12/2021 FINDINGS: CT HEAD FINDINGS Brain: Stable age related cerebral atrophy, ventriculomegaly and periventricular white matter disease.  No extra-axial fluid collections are identified. No CT findings for acute hemispheric infarction or intracranial hemorrhage. No mass lesions. The brainstem and cerebellum are normal. Vascular: Stable vascular calcifications but no aneurysm hyperdense vessels. Skull: No skull fracture is identified. Acute nasal bone fractures suspected. Sinuses/Orbits: Scattered paranasal sinus disease. The mastoid air cells and middle ear cavities are clear. The globes are intact. Other: No scalp lesions.  Suspect small frontal scalp hematoma. CT CERVICAL SPINE FINDINGS Alignment: Normal Skull base and vertebrae: No acute cervical spine fracture is identified. The skull base C1 and C1-2 articulations are maintained. Soft tissues and spinal canal: Sounds choose 1 Disc levels: Stable advanced degenerative cervical spondylosis with multilevel disc disease and facet disease. Upper chest: The lung apices demonstrate chronic scarring changes and a left pleural effusion. Other: None IMPRESSION: 1. Stable age related cerebral atrophy, ventriculomegaly and periventricular white matter disease. 2. No acute intracranial findings. 3. Acute nasal bone fractures. 4. Normal alignment of the cervical spine and no acute cervical spine fracture. 5. Stable advanced degenerative cervical spondylosis. 6. Left pleural effusion. Electronically Signed   By: Marijo Sanes M.D.   On: 07/02/2021 20:11   DG Chest Port 1 View  Result Date: 06/30/2021 CLINICAL DATA:  Hypoxia EXAM: PORTABLE CHEST 1 VIEW COMPARISON:  07/06/2021 FINDINGS: Multifocal pulmonary infiltrate, more focal within the right upper lobe and to a lesser extent left upper lobe, appears stable since prior examination, likely infectious in the appropriate clinical setting. No pneumothorax or pleural effusion. Cardiomegaly is stable. Coronary artery  bypass grafting and transcatheter aortic valve replacement have been performed. Left subclavian dual lead pacemaker is unchanged. Pulmonary vascularity is normal. IMPRESSION: Stable upper lobe predominant pulmonary infiltrate, likely infectious in the acute setting. Stable cardiomegaly. Electronically Signed   By: Fidela Salisbury M.D.   On: 06/30/2021 23:08   DG HIP UNILAT WITH PELVIS 2-3 VIEWS LEFT  Result Date: 06/13/2021 CLINICAL DATA:  Left hip pain following fall, initial encounter EXAM: DG HIP (WITH OR WITHOUT PELVIS) 3V LEFT COMPARISON:  None Available. FINDINGS: Pelvic ring is intact. Postsurgical changes are noted consistent with prior hernia repair on the left. No definitive fracture or dislocation is seen. No soft tissue abnormality is noted. IMPRESSION: No acute abnormality noted. Electronically Signed   By: Inez Catalina M.D.   On: 06/18/2021 23:08    ECHO 07/2019 ECHOCARDIOGRAPHIC DESCRIPTIONS  AORTIC ROOT                   Size: Normal             Dissection: INDETERM FOR DISSECTION  AORTIC VALVE               Leaflets: PROSTHETIC                  Morphology: Normal               Mobility: Fully mobile  LEFT VENTRICLE                   Size: Normal                        Anterior: Normal            Contraction: SEVERE GLOBAL DECREASE         Lateral: Normal             Closest EF:<15% (Estimated)  Septal: Normal              LV Masses: No Masses                       Apical: Normal                    LVH: None                          Inferior: Normal                                                      Posterior: Normal           Dias.FxClass: N/A  MITRAL VALVE               Leaflets: Normal                        Mobility: Fully mobile             Morphology: THICKENED LEAFLET(S)  LEFT ATRIUM                   Size: MODERATELY ENLARGED          LA Masses: No masses              IA Septum: Normal IAS  MAIN PA                   Size: Normal  PULMONIC VALVE              Morphology: Normal                        Mobility: Fully mobile  RIGHT VENTRICLE              RV Masses: No Masses                         Size: Normal              Free Wall: Normal                     Contraction: Normal  TRICUSPID VALVE               Leaflets: Normal                        Mobility: Fully mobile             Morphology: Normal  RIGHT ATRIUM                   Size: MILDLY ENLARGED               RA Other: None                RA Mass: No masses  PERICARDIUM                  Fluid: No effusion  INFERIOR VENACAVA                   Size: Normal Normal respiratory collapse  _________________________________________________________________________________________   DOPPLER ECHO and OTHER SPECIAL PROCEDURES  Aortic: MILD AR                    No AS                         230.8 cm/sec peak vel      21.3 mmHg peak grad                         11.6 mmHg mean grad        1.5 cm^2 by DOPPLER                 Mitral: MODERATE MR                No MS                         MV Inflow E Vel = nm*      MV Annulus E'Vel = nm*                         E/E'Ratio = nm*              Tricuspid: MILD TR                    No TS              Pulmonary: TRIVIAL PR                 No PS  _________________________________________________________________________________________  INTERPRETATION  SEVERE LV SYSTOLIC DYSFUNCTION (See above)  NORMAL RIGHT VENTRICULAR SYSTOLIC FUNCTION  MODERATE VALVULAR REGURGITATION (See above)  NO VALVULAR STENOSIS   TELEMETRY reviewed by me: None available for review  EKG reviewed by me: Ventricular paced with underlying atrial fibrillation rate 61  ASSESSMENT AND PLAN:  Derrick Cochran is an 71yoM with a PMH of CABG x4 in 10/1993 (cardiac cath 11/2013 showing patent LIMA-LAD, SVG-D1 and OM 1), ischemic cardiomyopathy (LVEF <15% 07/2019), chronic atrial fibrillation previously on warfarin (d/c due to falls and recent SDH), sick sinus  syndrome s/p DC PPM 06/2014, aortic stenosis s/p TAVR 03/2014, hyperlipidemia, type 2 diabetes, hypothyroidism, memory impairment who presented to [facility] ED 07/13/2021 after multiple falls and fractured his nose.  He tested positive for COVID 6 weeks ago and remains positive on admission. Cardiology is consulted for assistance with diuresis.   #COVID-19  #Dementia #Acute on chronic HFrEF (LVEF <15% 07/2019) Patient presented after a fall and was found to be COVID-positive on admission, with reported positive test 6 weeks ago.  He was initially not hypoxic or reportedly short of breath but was started on IV antibiotics and fluids on hospital day 2.  Last night the patient was tachypneic and hypoxic to 87 which improved on 2 L of nasal cannula, BNP checked was greater than 4500.  He is very frail, confused and reportedly agitated by nursing staff and has been difficult to keep a catheter or oxygen on him because he keeps pulling them off. -Agree with current therapy for COVID-19 -Recommend discontinuing IV fluids for concern for volume overload -Encourage p.o. intake as able -Agree with IV diuresis with Lasix 20 mg twice daily as his blood pressure tolerates. -Continue to hold metoprolol XL 50 mg daily due to hypotension, restart as able -I think that a goals of care discussion would be prudent, with his dementia, frequent falls, and  current mental status  #CAD #Chronic atrial fibrillation #SSS, s/p DC PPM -Aspirin discontinued due to severe thrombocytopenia -No telemetry available for review, heart rate was in the 70s with a ventricular paced rhythm and underlying atrial fibrillation on admission -Metoprolol on hold due to hypotension as above.  This patient's plan of care was discussed and created with Dr. Clayborn Bigness and he is in agreement.  Signed: Tristan Schroeder , PA-C 07/01/2021, 10:34 AM [facility] Cardiology

## 2021-07-01 NOTE — Progress Notes (Signed)
[facility] Progress Note  Patient: Derrick Cochran    TIR:[id number]  DOA: 07/09/2021    Date of Service: the patient was seen and examined on 07/01/2021  Brief hospital course: Derrick Cochran is a 86 year old male with atrial fibrillation, previously on Coumadin, recent subdural hematoma secondary to fall, end-stage systolic heart failure with an ejection fraction of less than 15%, non-insulin-dependent diabetes mellitus, and hypothyroidism, who presented to the emergency department on 5/17 for chief concerns of multiple falls.  In the emergency room, patient noted to have acute kidney injury with a creatinine of 1.63 (was 1.05 6 weeks prior) and also confirmed positive COVID test, noting that patient had tested positive 6 weeks ago as well.  Patient admitted to the hospital service and initially started on IV fluids.  He was not hypoxic so he was not started on any medications.  CRP levels normal.  In the emergency room, patient also noted to have multiple bruises secondary to falls and an acute nasal fracture.  According to patient's wife, his p.o. had been stopped recently.   BNP checked and found to be elevated at 4500.  Patient started becoming hypoxic on evening of 5/18 and fluids stopped.   Attempts to give patient IV Lasix have been quite limited due to hypotension.  By 5/19, renal function is also worse with creatinine up to 1.75.  Cardiology consulted and overall recommendation is supportive measures and to consider comfort care.  Lactic acid level checked and found to be elevated at 3.  Assessment and Plan: Assessment and Plan: * Acute on chronic systolic CHF (congestive heart failure) (HCC) Echocardiogram done last year in chart review notes an EF of less than 15%.  Patient has end-stage heart failure.  His p.o. Lasix had been recently stopped.  He is now in florid heart failure with cardiorenal syndrome and limited options and attempts to diurese.  We will try very light doses of  IV Lasix in hopes that it does not bottom out his blood pressure and see if we can offload his volume.  If this does not work, he likely needs to be made comfort care.  I discussed this with the patient's wife who understands how gravely ill he is and agrees with this plan.  She reaffirms that he is to be a DNR.  Acute respiratory failure with hypoxia (HCC) Was not initially hypoxic on admission.  This is secondary to fluid administration  COVID-19 virus infection Given normal CRP, positive test 6 weeks ago, patient is still likely just testing positive from previous COVID infection.  His hypoxia was only started on the night of 5/18 is due to heart failure, not COVID.  No need for Paxlovid.  AKI (acute kidney injury) ([place]) Secondary to decompensated heart failure plus poor p.o. intake.  Received some fluids which briefly helped, but as soon as fluids were stopped, his renal function has been declining, consistent with his heart failure and soft blood pressures.  As above.  Very poor prognosis.  Facial laceration - Secondary to fall - Attempted to irrigate and glue laceration in place at bedside - Patient became agitated with my attempts to clean his wounds with sterile water and NS flushes Wound care consult  HTN (hypertension) Blood pressure low so treated with IV fluids.  Antihypertensives on hold.  Senile dementia uncomp, with behavioral disturbance ([place]) Secondary to heart failure and dehydration.   Atrial fibrillation (HCC) Chronic.  Rate controlled.  No longer on Coumadin.  Hyperlipidemia - Resumed  home simvastatin 40 mg nightly  Protein-calorie malnutrition, severe Nutrition Status: Nutrition Problem: Severe Malnutrition Etiology: chronic illness (SDH) Signs/Symptoms: severe fat depletion, moderate fat depletion, moderate muscle depletion, severe muscle depletion Interventions: Ensure Enlive (each supplement provides 350kcal and 20 grams of protein), MVI,  Prostat     Hypothyroidism - Levothyroxine 100 mcg daily resumed       Body mass index is 19.68 kg/m.  Nutrition Problem: Severe Malnutrition Etiology: chronic illness (SDH)     Consultants: Cardiology  Procedures: None  Antimicrobials: IV Rocephin and Zithromax x1 dose 5/17  Code Status: DNR   Subjective: Patient still delirious, cannot sit still  Objective: Noted soft blood pressures. Vitals:   07/01/21 0318 07/01/21 0900  BP: 100/78 91/66  Pulse: 93 62  Resp: 20 17  Temp: 97.9 F (36.6 C) 97.9 F (36.6 C)  SpO2: 92% 97%    Intake/Output Summary (Last 24 hours) at 07/01/2021 1337 Last data filed at 07/01/2021 0604 Gross per 24 hour  Intake 673.54 ml  Output --  Net 673.54 ml    Filed Weights   06/30/21 1741  Weight: 62.2 kg   Body mass index is 19.68 kg/m.  Exam:  General: Oriented x1, mildly agitated HEENT: Normocephalic, noted nasal fracture and multiple bruises Cardiovascular: Irregular rhythm, rate controlled Respiratory: Clear to auscultation bilaterally, mild expiratory wheeze bilaterally Abdomen: Soft, nontender, nondistended, hypoactive bowel sounds Musculoskeletal: Night clubbing cyanosis or edema Skin: Noted bruising across torso, extremities Psychiatry: Underlying dementia Neurology: No focal deficits  Data Reviewed: Noted BNP of 4500, worsening renal function  Disposition:  Status is: Inpatient Remains inpatient appropriate because: Patient is critically ill and may not survive this hospitalization.    Anticipated discharge date: Unknown    Family Communication: Updated wife by phone DVT Prophylaxis: enoxaparin (LOVENOX) injection 40 mg Start: 06/30/21 2200  Lovenox    Author: Annita Brod ,MD 07/01/2021 1:37 PM  I have spent 35 minutes in the care of this critically ill patient including medical decision making, review of patient's labs and previous history, bedside examination and discussion of care with  specialist  To reach On-call, see care teams to locate the attending and reach out via [email or web address]. Between 7PM-7AM, please contact night-coverage If you still have difficulty reaching the attending provider, please page the [facility] (Director on Call) for [facility] on amion for assistance.

## 2021-07-01 NOTE — Progress Notes (Addendum)
Speech Language Pathology Treatment: Dysphagia  Patient Details Name: Derrick Cochran MRN: [id number] DOB: 10-Sep-1935 Today's Date: 07/01/2021 Time: 1225-1310 SLP Time Calculation (min) (ACUTE ONLY): 45 min  Assessment / Plan / Recommendation Clinical Impression  Pt seen today for toleration of oral diet. He was resting in bed w/ mildly increased WOB at rest; any exertion appeared to increase WOB and required rest breaks for breathing to calm. Min verbal to basic questions and accepted po trials fed to him. MOD+ support given d/t deconditioned status and Cognitive decline. He remains on airborne isolation in room. Noted per MD's note this date: "Acute on chronic systolic CHF (congestive heart failure). Echocardiogram done last year in chart review notes an EF of less than 15%.  Patient has end-stage heart failure. His p.o. Lasix had been recently stopped.  He is now in florid heart failure with cardiorenal syndrome and limited options and attempts to diurese.  We will try very light doses of IV Lasix in hopes that it does not bottom out his blood pressure and see if we can offload his volume.  If this does not work, he likely needs to be made comfort care.  I discussed this with the patient's wife who understands how gravely ill he is and agrees with this plan.  She reaffirms that he is to be a DNR. AKI (acute kidney injury) Secondary to decompensated heart failure plus poor p.o. intake. Received some fluids which briefly helped, but as soon as fluids were stopped, his renal function has been declining, consistent with his heart failure and soft blood pressures. Senile dementia uncomp, with behavioral disturbance.".  W/ po trials during lunch meal today, he belched significantly post bites/sips. This behavior can increase risk for min Regurgitation of food/liquid material superiorly which increases risk for aspiration of the REFLUX material.   During po intake, he appeared to adequately tolerate the  puree foods and thin liquids(pinched straw to monitor bolus volume) w/ no immediate, clinical s/s of aspiration; no immediate coughing or further decline in respiratory presentation. Min open-mouth posture/breathing noted prolonging oral phase slightly. PO trials were fed to him Slowly to allow for clearing and for Rest Breaks b/t trials to calm any increased WOB/SOB. No decline in O2 sats noted during oral intake; ~94-95%. Feeding and full positioning support given.  Pt appears at increased risk for aspiration in setting of deconditioned status and declined Pulmonary status. Recommend continue a Pureed diet w/ thin liquids; moistened foods for ease/flavor. Aspiration precautions including REST BREAKS b/t bites/sips to allow breathing to calm from exertion of po tasks. Feeding Support. REFLUX precautions. Pills CRUSHED in Puree for safer swallowing. Oral care. Precautions posted in chart/room.  MD has indicated need for Palliative Care f/u at current time per chart.  Would Not recommend diet upgrade d/t the exertion/demand on his breathing -- maintain current diet for conservation of energy. Dietician f/u w/ supplements. ST services could follow pt at Discharge for any further needs/upgrade of diet at next venue of care if appropriate. MD/NSG updated.          HPI HPI: Per H&P "Derrick Cochran is a 86 year old male with atrial fibrillation, previously on Coumadin, recent subdural hematoma secondary to fall, depression, anxiety, BPH, hyperlipidemia, non-insulin-dependent diabetes mellitus, hypothyroid, insomnia, anxiety, who presents emergency department for chief concerns of multiple falls.    Initial vitals in the emergency department show temperature of 97.7, respiration rate of 18, heart rate of 77, blood pressure 107/62, SPO2 of 98% on room  air.    Serum sodium is 141, potassium 4.8, chloride 110, bicarb 21, BUN of 47, serum creatinine of 1.63, GFR 41, nonfasting blood glucose 224, WBC 6, hemoglobin  11.1, platelets of 64.     ED treatment: Azithromycin 500 mg IV, ceftriaxone 1 g IV.     At bedside patient was able to tell me his name, his age.  He states the current year is 74 and then he corrected himself and states in 1976.  He states that the current president of the [place] is Doctor, general practice when given multiple choice of Clinton, Bush, Biden, Trump, [place].     Per his spouse, he saw Dr. Doy Hutching and [place] outpatient after discharge and they have not resumed patient's Coumadin stating that they are pending reevaluation from outpatient neurosurgeon.  Patient has an outpatient appointment with Dr. Cari Caraway for 06/30/2021.     Per his wife, patient has been having poor appetite since discharge from hospital. He has been drinking boost, Vanilla however overall poor p.o. intake.     On my second evaluation of patient to the floor, I attempted to irrigate the lacerations on his face and repair with Dermabond and Steri-Strips.  Patient swipes at my hand stating that he wants to be left alone and wants to go to sleep.  He states that the solution I am using for to irrigate is too cold and he wants warmed water.  His agitation is onset due to my attempts at repairing the lacerations on his wound.     Social history: He lives with wife of nine years.  She states that he is not a tobacco user, no EtOH, or drug use.     ROS: Unable to complete due to mental status changes with possible baseline dementia previously undiagnosed."      SLP Plan  All goals met (d/c in setting of acuity of illness also)      Recommendations for follow up therapy are one component of a multi-disciplinary discharge planning process, led by the attending physician.  Recommendations may be updated based on patient status, additional functional criteria and insurance authorization.    Recommendations  Diet recommendations: Dysphagia 1 (puree);Thin liquid Liquids provided via: Cup;Straw (monitor) Medication Administration: Crushed  with puree Supervision: Staff to assist with self feeding;Full supervision/cueing for compensatory strategies Compensations: Minimize environmental distractions;Slow rate;Small sips/bites;Lingual sweep for clearance of pocketing;Follow solids with liquid (REST BREAKS during meals) Postural Changes and/or Swallow Maneuvers: Out of bed for meals;Seated upright 90 degrees;Upright 30-60 min after meal                General recommendations:  (Dietician following; Palliative Care for [place]) Oral Care Recommendations: Oral care BID;Oral care before and after PO;Staff/trained caregiver to provide oral care Follow Up Recommendations: Follow physician's recommendations for discharge plan and follow up therapies (TBD) Assistance recommended at discharge: Frequent or constant Supervision/Assistance (d/t deconditioned status) SLP Visit Diagnosis: Dysphagia, oropharyngeal phase (R13.12) (in setting of deconditioned status; CHF and declined pulmonary presentation) Plan: All goals met (d/c in setting of acuity of illness also)             Orinda Kenner, MS, CCC-SLP Speech Language Pathologist Rehab Services; [place] [phone] (ascom) Cortney Mckinney  07/01/2021, 4:38 PM

## 2021-07-01 NOTE — Progress Notes (Signed)
Wife in room. Spoke to her in length. States, "I'm ready to let him go. He's fought long enough. I want him to be comfortable now. My family will be here tomorrow morning to say goodbye but I understand if he let's go tonight on the morphine drip. It's what he needs. When he's ready to go, he'll let go and that's fine with me." Wife says she's been his caregiver for a long time now and says she's grateful for their 8 year marriage. Reitterates her desire for him to be comfortable.

## 2021-07-02 DIAGNOSIS — I5023 Acute on chronic systolic (congestive) heart failure: Secondary | ICD-10-CM | POA: Diagnosis not present

## 2021-07-02 DIAGNOSIS — N179 Acute kidney failure, unspecified: Secondary | ICD-10-CM | POA: Diagnosis not present

## 2021-07-02 DIAGNOSIS — J9601 Acute respiratory failure with hypoxia: Secondary | ICD-10-CM | POA: Diagnosis not present

## 2021-07-02 DIAGNOSIS — U071 COVID-19: Secondary | ICD-10-CM | POA: Diagnosis not present

## 2021-07-02 NOTE — Progress Notes (Signed)
[facility] Progress Note  Patient: Derrick Cochran    BOF:[id number]  DOA: 06/23/2021    Date of Service: the patient was seen and examined on 07/02/2021  Brief hospital course: Mr. Derrick Cochran is a 86 year old male with atrial fibrillation, previously on Coumadin, recent subdural hematoma secondary to fall, end-stage systolic heart failure with an ejection fraction of less than 15%, non-insulin-dependent diabetes mellitus, and hypothyroidism, who presented to the emergency department on 5/17 for chief concerns of multiple falls.  In the emergency room, patient noted to have acute kidney injury with a creatinine of 1.63 (was 1.05 6 weeks prior) and also confirmed positive COVID test, noting that patient had tested positive 6 weeks ago as well.  Patient admitted to the hospital service and initially started on IV fluids.  He was not hypoxic so he was not started on any medications.  CRP levels normal.  In the emergency room, patient also noted to have multiple bruises secondary to falls and an acute nasal fracture.  According to patient's wife, his p.o. had been stopped recently.   BNP checked and found to be elevated at 4500.  Patient started becoming hypoxic on evening of 5/18 and fluids stopped.   Attempts to give patient IV Lasix have been quite limited due to hypotension.  By 5/19, renal function is also worse with creatinine up to 1.75.  Cardiology consulted and overall recommendation is supportive measures and to consider comfort care.  By end of day 5/19, wife requested that he be made comfortable.  Medications and labs stopped and morphine drip started.  Assessment and Plan: Assessment and Plan: * Acute on chronic systolic CHF (congestive heart failure) (HCC) Echocardiogram done last year in chart review notes an EF of less than 15%.  Patient has end-stage heart failure.  His p.o. Lasix had been recently stopped.  He is now in florid heart failure with cardiorenal syndrome and limited  options and attempts to diurese.  Attempts at trying to diurese with IV Lasix were limited due to hypotension.  By 5/19, wife requested that he be made comfortable and patient was started on morphine drip and other medications stopped.  Acute respiratory failure with hypoxia (HCC) Was not initially hypoxic on admission.  This is secondary to fluid administration.  2 L oxygen for comfort  COVID-19 virus infection Given normal CRP, positive test 6 weeks ago, patient is still likely just testing positive from previous COVID infection.  His hypoxia was only started on the night of 5/18 is due to heart failure, not COVID.  No need for Paxlovid.  AKI (acute kidney injury) ([place]) Secondary to decompensated heart failure plus poor p.o. intake.  Received some fluids which briefly helped, but as soon as fluids were stopped, his renal function has been declining, consistent with his heart failure and soft blood pressures.  Labs stopped for comfort care  Facial laceration - Secondary to fall.  HTN (hypertension) Blood pressure low so treated with IV fluids.  Antihypertensives on hold.  Senile dementia uncomp, with behavioral disturbance ([place]) Secondary to heart failure and dehydration.   Atrial fibrillation (HCC) Chronic.  Rate controlled.  No longer on Coumadin.  Hyperlipidemia - Resumed home simvastatin 40 mg nightly, discontinued for comfort care  Protein-calorie malnutrition, severe Nutrition Status: Nutrition Problem: Severe Malnutrition Etiology: chronic illness (SDH) Signs/Symptoms: severe fat depletion, moderate fat depletion, moderate muscle depletion, severe muscle depletion Interventions: Ensure Enlive (each supplement provides 350kcal and 20 grams of protein), MVI, Prostat  Hypothyroidism - Levothyroxine 100 mcg daily resumed, discontinued for comfort care       Body mass index is 19.68 kg/m.  Nutrition Problem: Severe Malnutrition Etiology: chronic illness (SDH)      Consultants: Cardiology  Procedures: None  Antimicrobials: IV Rocephin and Zithromax x1 dose 5/17  Code Status: DNR   Subjective: Patient resting comfortably  Objective: Noted soft blood pressures. Vitals:   07/01/21 0900 07/01/21 1700  BP: 91/66 (!) 93/55  Pulse: 62 70  Resp: 17 20  Temp: 97.9 F (36.6 C) (!) 97.5 F (36.4 C)  SpO2: 97% 100%    Intake/Output Summary (Last 24 hours) at 07/02/2021 1302 Last data filed at 07/02/2021 1140 Gross per 24 hour  Intake 17.81 ml  Output --  Net 17.81 ml    Filed Weights   06/30/21 1741  Weight: 62.2 kg   Body mass index is 19.68 kg/m.  Exam:  General: Resting comfortably HEENT: Normocephalic, noted nasal fracture and multiple bruises Cardiovascular: Irregular rhythm, rate controlled Respiratory: Clear to auscultation bilaterally, mild expiratory wheeze bilaterally  Data Reviewed: No further labs due to comfort care  Disposition:  Status is: Inpatient Remains inpatient appropriate because: Patient actively dying and I expect he will likely pass in the next 24 to 48 hours    Anticipated discharge date: Expected to pass in the next 24 to 48 hours    Family Communication: Wife at bedside DVT Prophylaxis: Discontinued for comfort care    Author: Annita Brod ,MD 07/02/2021 1:02 PM  I have spent 35 minutes in the care of this critically ill patient including medical decision making, review of patient's labs and previous history, bedside examination and discussion of care with specialist  To reach On-call, see care teams to locate the attending and reach out via [email or web address]. Between 7PM-7AM, please contact night-coverage If you still have difficulty reaching the attending provider, please page the [facility] (Director on Call) for [facility] on amion for assistance.

## 2021-07-03 DIAGNOSIS — J9601 Acute respiratory failure with hypoxia: Secondary | ICD-10-CM | POA: Diagnosis not present

## 2021-07-03 DIAGNOSIS — N179 Acute kidney failure, unspecified: Secondary | ICD-10-CM | POA: Diagnosis not present

## 2021-07-03 DIAGNOSIS — I482 Chronic atrial fibrillation, unspecified: Secondary | ICD-10-CM | POA: Diagnosis not present

## 2021-07-03 DIAGNOSIS — I5023 Acute on chronic systolic (congestive) heart failure: Secondary | ICD-10-CM | POA: Diagnosis not present

## 2021-07-14 NOTE — Progress Notes (Signed)
Patient expired at 1625. MD and [facility] has been notified. Spouse has been notified. Spouse and other family members will come to see the  patient in his room.

## 2021-07-14 NOTE — Progress Notes (Signed)
[facility] Progress Note  Patient: Derrick Cochran    WYO:[id number]  DOA: 06/13/2021    Date of Service: the patient was seen and examined on 07-08-21  Brief hospital course: Derrick Cochran is a 86 year old male with atrial fibrillation, previously on Coumadin, recent subdural hematoma secondary to fall, end-stage systolic heart failure with an ejection fraction of less than 15%, non-insulin-dependent diabetes mellitus, and hypothyroidism, who presented to the emergency department on 5/17 for chief concerns of multiple falls.  In the emergency room, patient noted to have acute kidney injury with a creatinine of 1.63 (was 1.05 6 weeks prior) and also confirmed positive COVID test, noting that patient had tested positive 6 weeks ago as well.  Patient admitted to the hospital service and initially started on IV fluids.  He was not hypoxic so he was not started on any medications.  CRP levels normal.  In the emergency room, patient also noted to have multiple bruises secondary to falls and an acute nasal fracture.  According to patient's wife, his p.o. had been stopped recently.   BNP checked and found to be elevated at 4500.  Patient started becoming hypoxic on evening of 5/18 and fluids stopped.   Attempts to give patient IV Lasix have been quite limited due to hypotension.  By 5/19, renal function is also worse with creatinine up to 1.75.  Cardiology consulted and overall recommendation is supportive measures and to consider comfort care.  By end of day 5/19, wife requested that he be made comfortable.  Medications and labs stopped and morphine drip started.  Assessment and Plan: Assessment and Plan: * Acute on chronic systolic CHF (congestive heart failure) (HCC) Echocardiogram done last year in chart review notes an EF of less than 15%.  Patient has end-stage heart failure.  His p.o. Lasix had been recently stopped.  He is now in florid heart failure with cardiorenal syndrome and limited  options and attempts to diurese.  Attempts at trying to diurese with IV Lasix were limited due to hypotension.  By 5/19, wife requested that he be made comfortable and patient was started on morphine drip and other medications stopped.  Acute respiratory failure with hypoxia (HCC) Was not initially hypoxic on admission.  This is secondary to fluid administration.  2 L oxygen for comfort  COVID-19 virus infection Given normal CRP, positive test 6 weeks ago, patient is still likely just testing positive from previous COVID infection.  His hypoxia was only started on the night of 5/18 is due to heart failure, not COVID.  No need for Paxlovid.  AKI (acute kidney injury) ([place]) Secondary to decompensated heart failure plus poor p.o. intake.  Received some fluids which briefly helped, but as soon as fluids were stopped, his renal function has been declining, consistent with his heart failure and soft blood pressures.  Labs stopped for comfort care  Facial laceration - Secondary to fall.  HTN (hypertension) Blood pressure low so treated with IV fluids.  Antihypertensives on hold.  Senile dementia uncomp, with behavioral disturbance ([place]) Secondary to heart failure and dehydration.   Atrial fibrillation (HCC) Chronic.  Rate controlled.  No longer on Coumadin.  Hyperlipidemia - Resumed home simvastatin 40 mg nightly, discontinued for comfort care  Protein-calorie malnutrition, severe Nutrition Status: Nutrition Problem: Severe Malnutrition Etiology: chronic illness (SDH) Signs/Symptoms: severe fat depletion, moderate fat depletion, moderate muscle depletion, severe muscle depletion Interventions: Ensure Enlive (each supplement provides 350kcal and 20 grams of protein), MVI, Prostat  Hypothyroidism - Levothyroxine 100 mcg daily resumed, discontinued for comfort care       Body mass index is 19.68 kg/m.  Nutrition Problem: Severe Malnutrition Etiology: chronic illness (SDH)      Consultants: Cardiology  Procedures: None  Antimicrobials: IV Rocephin and Zithromax x1 dose 5/17  Code Status: DNR   Subjective: Wife reports patient was a little restless earlier, but more calm now.  Objective: Noted soft blood pressures. Vitals:   07/01/21 1700 Jul 10, 2021 0754  BP: (!) 93/55 (!) 99/58  Pulse: 70 83  Resp: 20 16  Temp: (!) 97.5 F (36.4 C) 97.7 F (36.5 C)  SpO2: 100% 92%    Intake/Output Summary (Last 24 hours) at Jul 10, 2021 1254 Last data filed at 2021/07/10 0626 Gross per 24 hour  Intake 18.56 ml  Output 600 ml  Net -581.44 ml    Filed Weights   06/30/21 1741  Weight: 62.2 kg   Body mass index is 19.68 kg/m.  Exam:  General: Resting comfortably HEENT: Normocephalic, noted nasal fracture and multiple bruises Cardiovascular: Irregular rhythm, rate controlled Respiratory: Clear to auscultation bilaterally, mild expiratory wheeze bilaterally  Data Reviewed: No further labs due to comfort care  Disposition:  Status is: Inpatient Remains inpatient appropriate because: Patient actively dying and I expect he will likely pass in the next 24 to 48 hours    Anticipated discharge date: Expected to pass in the next 24 to 48 hours.  If he is unchanged tomorrow, we will look at hospice facility options.    Family Communication: Wife at bedside DVT Prophylaxis: Discontinued for comfort care    Author: Annita Brod ,MD Jul 10, 2021 12:54 PM  I have spent 35 minutes in the care of this critically ill patient including medical decision making, review of patient's labs and previous history, bedside examination and discussion of care with specialist  To reach On-call, see care teams to locate the attending and reach out via [email or web address]. Between 7PM-7AM, please contact night-coverage If you still have difficulty reaching the attending provider, please page the [facility] (Director on Call) for [facility] on amion for assistance.

## 2021-07-14 NOTE — Death Summary Note (Signed)
DEATH SUMMARY   Patient Details  Name: Derrick Cochran MRN: [id number] DOB: 02-28-1935 UVO:[id number], Leonie Douglas, MD Admission/Discharge Information   Admit Date:  Jul 18, 2021  Date of Death: Date of Death: 07/22/21  Time of Death: Time of Death: 40  Length of Stay: 3   Principle Cause of death: Acute on chronic end-stage systolic heart failure  Hospital Diagnoses: Principal Problem:   Acute on chronic systolic CHF (congestive heart failure) ([place]) Active Problems:   Acute respiratory failure with hypoxia ([place])   AKI (acute kidney injury) ([place])   COVID-19 virus infection   HTN (hypertension)   Facial laceration   Senile dementia uncomp, with behavioral disturbance ([place])   Atrial fibrillation ([place])   Hyperlipidemia   Protein-calorie malnutrition, severe   Diabetes ([place])   Hypothyroidism   Coronary artery disease   BPH (benign prostatic hyperplasia)   Hospital Course: Mr. Derrick Cochran is a 86 year old male with atrial fibrillation, previously on Coumadin, recent subdural hematoma secondary to fall, end-stage systolic heart failure with an ejection fraction of less than 15%, non-insulin-dependent diabetes mellitus, and hypothyroidism, who presented to the emergency department on 07/19/2022 for chief concerns of multiple falls.  In the emergency room, patient noted to have acute kidney injury with a creatinine of 1.63 (was 1.05 6 weeks prior) and also confirmed positive COVID test, noting that patient had tested positive 6 weeks ago as well.  Patient admitted to the hospital service and initially started on IV fluids.  He was not hypoxic so he was not started on any medications.  CRP levels normal.  In the emergency room, patient also noted to have multiple bruises secondary to falls and an acute nasal fracture.  According to patient's wife, his p.o. had been stopped recently.   BNP checked and found to be elevated at 4500.  Patient started becoming hypoxic on evening of 5/18 and fluids  stopped.   Attempts to give patient IV Lasix have been quite limited due to hypotension.  By 5/19, renal function is also worse with creatinine up to 1.75.  Cardiology consulted and overall recommendation is supportive measures and to consider comfort care.  By end of day 5/19, wife requested that he be made comfortable.  Medications and labs stopped and morphine drip started.  Patient expired on 2022/07/23 at 4:25 PM.  Assessment and Plan: * Acute on chronic systolic CHF (congestive heart failure) (HCC) Echocardiogram done last year in chart review notes an EF of less than 15%.  Patient has end-stage heart failure.  His p.o. Lasix had been recently stopped.  He is now in florid heart failure with cardiorenal syndrome and limited options and attempts to diurese.  Attempts at trying to diurese with IV Lasix were limited due to hypotension.  By 5/19, wife requested that he be made comfortable and patient was started on morphine drip and other medications stopped.  Acute respiratory failure with hypoxia (HCC) Was not initially hypoxic on admission.  This is secondary to fluid administration.  2 L oxygen for comfort  COVID-19 virus infection Given normal CRP, positive test 6 weeks ago, patient is still likely just testing positive from previous COVID infection.  His hypoxia was only started on the night of 5/18 is due to heart failure, not COVID.  No need for Paxlovid.  AKI (acute kidney injury) ([place]) Secondary to decompensated heart failure plus poor p.o. intake.  Received some fluids which briefly helped, but as soon as fluids were stopped, his renal function has been declining,  consistent with his heart failure and soft blood pressures.  Labs stopped for comfort care  Facial laceration - Secondary to fall.  HTN (hypertension) Blood pressure low so treated with IV fluids.  Antihypertensives on hold.  Senile dementia uncomp, with behavioral disturbance ([place]) Secondary to heart failure and dehydration.    Atrial fibrillation (HCC) Chronic.  Rate controlled.  No longer on Coumadin.  Hyperlipidemia - Resumed home simvastatin 40 mg nightly, discontinued for comfort care  Protein-calorie malnutrition, severe Nutrition Status: Nutrition Problem: Severe Malnutrition Etiology: chronic illness (SDH) Signs/Symptoms: severe fat depletion, moderate fat depletion, moderate muscle depletion, severe muscle depletion Interventions: Ensure Enlive (each supplement provides 350kcal and 20 grams of protein), MVI, Prostat     Hypothyroidism - Levothyroxine 100 mcg daily resumed, discontinued for comfort care  Diabetes ([place]) Type 2 diabetes mellitus with hyperglycemia, not on insulin.  Once he was made comfort care, sliding scale was discontinued.         Procedures: None  Consultations: Cardiology  The results of significant diagnostics from this hospitalization (including imaging, microbiology, ancillary and laboratory) are listed below for reference.   Significant Diagnostic Studies: DG Chest 1 View  Result Date: 06/18/2021 CLINICAL DATA:  Unwitnessed fall. EXAM: CHEST  1 VIEW COMPARISON:  May 12, 2021. FINDINGS: Stable cardiomediastinal silhouette. Status post coronary bypass graft. Status post transcatheter aortic valve repair. Left-sided pacemaker is unchanged. Left basilar atelectasis is noted with probable small left pleural effusion. Right upper lobe airspace opacity is noted concerning for pneumonia. Mild right basilar atelectasis or infiltrate is noted as well. Bony thorax is unremarkable. IMPRESSION: Left basilar atelectasis is noted with probable small left pleural effusion. Right upper and lower lobe airspace opacities are noted concerning for pneumonia or possibly atelectasis. Electronically Signed   By: Marijo Conception M.D.   On: 06/20/2021 19:49   CT HEAD WO CONTRAST (5MM)  Result Date: 06/14/2021 CLINICAL DATA:  Unwitnessed fall. EXAM: CT HEAD WITHOUT CONTRAST CT CERVICAL  SPINE WITHOUT CONTRAST TECHNIQUE: Multidetector CT imaging of the head and cervical spine was performed following the standard protocol without intravenous contrast. Multiplanar CT image reconstructions of the cervical spine were also generated. RADIATION DOSE REDUCTION: This exam was performed according to the departmental dose-optimization program which includes automated exposure control, adjustment of the mA and/or kV according to patient size and/or use of iterative reconstruction technique. COMPARISON:  Head CT 06/27/2021 and cervical spine CT 05/12/2021 FINDINGS: CT HEAD FINDINGS Brain: Stable age related cerebral atrophy, ventriculomegaly and periventricular white matter disease. No extra-axial fluid collections are identified. No CT findings for acute hemispheric infarction or intracranial hemorrhage. No mass lesions. The brainstem and cerebellum are normal. Vascular: Stable vascular calcifications but no aneurysm hyperdense vessels. Skull: No skull fracture is identified. Acute nasal bone fractures suspected. Sinuses/Orbits: Scattered paranasal sinus disease. The mastoid air cells and middle ear cavities are clear. The globes are intact. Other: No scalp lesions.  Suspect small frontal scalp hematoma. CT CERVICAL SPINE FINDINGS Alignment: Normal Skull base and vertebrae: No acute cervical spine fracture is identified. The skull base C1 and C1-2 articulations are maintained. Soft tissues and spinal canal: Sounds choose 1 Disc levels: Stable advanced degenerative cervical spondylosis with multilevel disc disease and facet disease. Upper chest: The lung apices demonstrate chronic scarring changes and a left pleural effusion. Other: None IMPRESSION: 1. Stable age related cerebral atrophy, ventriculomegaly and periventricular white matter disease. 2. No acute intracranial findings. 3. Acute nasal bone fractures. 4. Normal alignment of the cervical spine  and no acute cervical spine fracture. 5. Stable advanced  degenerative cervical spondylosis. 6. Left pleural effusion. Electronically Signed   By: Marijo Sanes M.D.   On: 07/09/2021 20:11   CT HEAD WO CONTRAST (5MM)  Result Date: 06/28/2021 CLINICAL DATA:  Intracranial hemorrhage EXAM: CT HEAD WITHOUT CONTRAST TECHNIQUE: Contiguous axial images were obtained from the base of the skull through the vertex without intravenous contrast. RADIATION DOSE REDUCTION: This exam was performed according to the departmental dose-optimization program which includes automated exposure control, adjustment of the mA and/or kV according to patient size and/or use of iterative reconstruction technique. COMPARISON:  CT head May 14, 2021. FINDINGS: Brain: Previously noted parafalcine subdural blood and intraventricular hemorrhage is essentially resolved. No evidence of new hemorrhage, acute large vascular territory infarct, mass lesion, midline shift, hydrocephalus. Remote lacunar infarcts in the cerebellum. Cerebral atrophy. Vascular: Calcific intracranial atherosclerosis. No hyperdense vessel identified. Skull: No acute fracture. Sinuses/Orbits: Largely clear sinuses.  No acute orbital findings. Other: No mastoid effusions. IMPRESSION: Previously noted parafalcine subdural and intraventricular hemorrhage is essentially resolved. No new acute intracranial abnormality Electronically Signed   By: Margaretha Sheffield M.D.   On: 06/28/2021 13:55   CT Cervical Spine Wo Contrast  Result Date: 06/22/2021 CLINICAL DATA:  Unwitnessed fall. EXAM: CT HEAD WITHOUT CONTRAST CT CERVICAL SPINE WITHOUT CONTRAST TECHNIQUE: Multidetector CT imaging of the head and cervical spine was performed following the standard protocol without intravenous contrast. Multiplanar CT image reconstructions of the cervical spine were also generated. RADIATION DOSE REDUCTION: This exam was performed according to the departmental dose-optimization program which includes automated exposure control, adjustment of the mA  and/or kV according to patient size and/or use of iterative reconstruction technique. COMPARISON:  Head CT 06/27/2021 and cervical spine CT 05/12/2021 FINDINGS: CT HEAD FINDINGS Brain: Stable age related cerebral atrophy, ventriculomegaly and periventricular white matter disease. No extra-axial fluid collections are identified. No CT findings for acute hemispheric infarction or intracranial hemorrhage. No mass lesions. The brainstem and cerebellum are normal. Vascular: Stable vascular calcifications but no aneurysm hyperdense vessels. Skull: No skull fracture is identified. Acute nasal bone fractures suspected. Sinuses/Orbits: Scattered paranasal sinus disease. The mastoid air cells and middle ear cavities are clear. The globes are intact. Other: No scalp lesions.  Suspect small frontal scalp hematoma. CT CERVICAL SPINE FINDINGS Alignment: Normal Skull base and vertebrae: No acute cervical spine fracture is identified. The skull base C1 and C1-2 articulations are maintained. Soft tissues and spinal canal: Sounds choose 1 Disc levels: Stable advanced degenerative cervical spondylosis with multilevel disc disease and facet disease. Upper chest: The lung apices demonstrate chronic scarring changes and a left pleural effusion. Other: None IMPRESSION: 1. Stable age related cerebral atrophy, ventriculomegaly and periventricular white matter disease. 2. No acute intracranial findings. 3. Acute nasal bone fractures. 4. Normal alignment of the cervical spine and no acute cervical spine fracture. 5. Stable advanced degenerative cervical spondylosis. 6. Left pleural effusion. Electronically Signed   By: Marijo Sanes M.D.   On: 06/28/2021 20:11   DG Chest Port 1 View  Result Date: 06/30/2021 CLINICAL DATA:  Hypoxia EXAM: PORTABLE CHEST 1 VIEW COMPARISON:  06/21/2021 FINDINGS: Multifocal pulmonary infiltrate, more focal within the right upper lobe and to a lesser extent left upper lobe, appears stable since prior  examination, likely infectious in the appropriate clinical setting. No pneumothorax or pleural effusion. Cardiomegaly is stable. Coronary artery bypass grafting and transcatheter aortic valve replacement have been performed. Left subclavian dual lead pacemaker is unchanged. Pulmonary  vascularity is normal. IMPRESSION: Stable upper lobe predominant pulmonary infiltrate, likely infectious in the acute setting. Stable cardiomegaly. Electronically Signed   By: Fidela Salisbury M.D.   On: 06/30/2021 23:08   DG HIP UNILAT WITH PELVIS 2-3 VIEWS LEFT  Result Date: 07/06/2021 CLINICAL DATA:  Left hip pain following fall, initial encounter EXAM: DG HIP (WITH OR WITHOUT PELVIS) 3V LEFT COMPARISON:  None Available. FINDINGS: Pelvic ring is intact. Postsurgical changes are noted consistent with prior hernia repair on the left. No definitive fracture or dislocation is seen. No soft tissue abnormality is noted. IMPRESSION: No acute abnormality noted. Electronically Signed   By: Inez Catalina M.D.   On: 06/18/2021 23:08    Microbiology: Recent Results (from the past 240 hour(s))  Resp Panel by RT-PCR (Flu A&B, Covid) Nasopharyngeal Swab     Status: Abnormal   Collection Time: 07/02/2021 10:39 PM   Specimen: Nasopharyngeal Swab; Nasopharyngeal(NP) swabs in vial transport medium  Result Value Ref Range Status   SARS Coronavirus 2 by RT PCR POSITIVE (A) NEGATIVE Final    Comment: (NOTE) SARS-CoV-2 target nucleic acids are DETECTED.  The SARS-CoV-2 RNA is generally detectable in upper respiratory specimens during the acute phase of infection. Positive results are indicative of the presence of the identified virus, but do not rule out bacterial infection or co-infection with other pathogens not detected by the test. Clinical correlation with patient history and other diagnostic information is necessary to determine patient infection status. The expected result is Negative.  Fact Sheet for  Patients: EntrepreneurPulse.com.au  Fact Sheet for Healthcare Providers: IncredibleEmployment.be  This test is not yet approved or cleared by the [place] FDA and  has been authorized for detection and/or diagnosis of SARS-CoV-2 by FDA under an Emergency Use Authorization (EUA).  This EUA will remain in effect (meaning this test can be used) for the duration of  the COVID-19 declaration under Section 564(b)(1) of the A ct, 21 U.S.C. section 360bbb-3(b)(1), unless the authorization is terminated or revoked sooner.     Influenza A by PCR NEGATIVE NEGATIVE Final   Influenza B by PCR NEGATIVE NEGATIVE Final    Comment: (NOTE) The Xpert Xpress SARS-CoV-2/FLU/RSV plus assay is intended as an aid in the diagnosis of influenza from Nasopharyngeal swab specimens and should not be used as a sole basis for treatment. Nasal washings and aspirates are unacceptable for Xpert Xpress SARS-CoV-2/FLU/RSV testing.  Fact Sheet for Patients: EntrepreneurPulse.com.au  Fact Sheet for Healthcare Providers: IncredibleEmployment.be  This test is not yet approved or cleared by the [place] FDA and has been authorized for detection and/or diagnosis of SARS-CoV-2 by FDA under an Emergency Use Authorization (EUA). This EUA will remain in effect (meaning this test can be used) for the duration of the COVID-19 declaration under Section 564(b)(1) of the Act, 21 U.S.C. section 360bbb-3(b)(1), unless the authorization is terminated or revoked.  Performed at [facility], [address] [place], [place] [address]     Time spent: 25 minutes  Signed: Annita Brod, MD 2021/07/31

## 2021-07-14 NOTE — Assessment & Plan Note (Signed)
Type 2 diabetes mellitus with hyperglycemia, not on insulin.  Once he was made comfort care, sliding scale was discontinued.

## 2021-07-14 DEATH — deceased

## 2022-07-29 IMAGING — CT CT IMAGE GUIDED DRAINAGE BY PERCUTANEOUS CATHETER
1 of 2 series · 14 of 32 positions shown, 19 images · non-contrast
Comparison: none

INDICATION: Acute calculus cholecystitis
TECHNIQUE: Informed written consent was obtained from the patient after a
thorough discussion of the procedural risks, benefits and
alternatives. All questions were addressed. Maximal Sterile Barrier
Technique was utilized including caps, mask, sterile gowns, sterile
gloves, sterile drape, hand hygiene and skin antiseptic. A timeout
was performed prior to the initiation of the procedure.

[Series 2: i-spiral 5.0 b30f · axial · 0.78mm/px · z∈[+1188,+1317]mm · 14 of 43 slices shown, 19 images]
[im 3/43  soft-tissue]
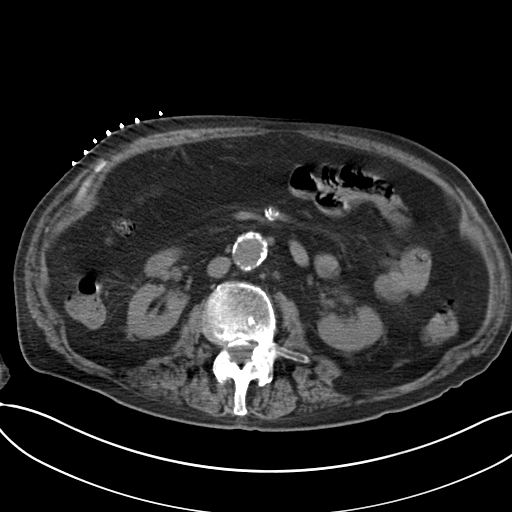
[im 3/43  bone]
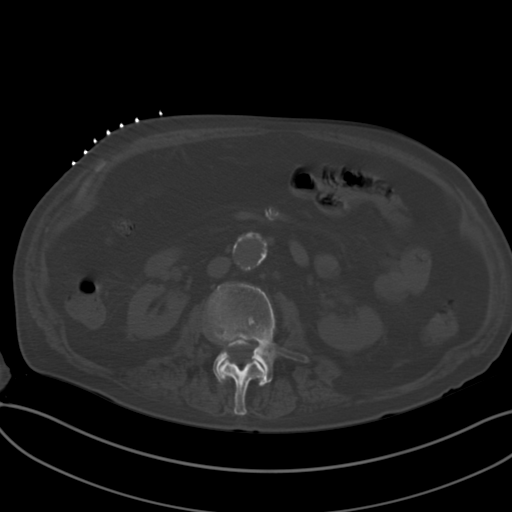
[im 5/43  soft-tissue]
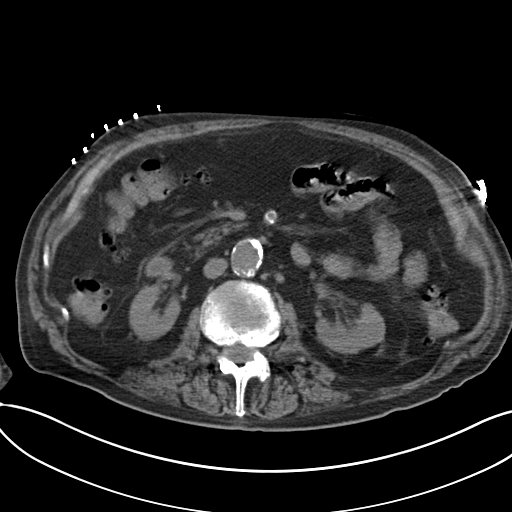
[im 10/43  soft-tissue]
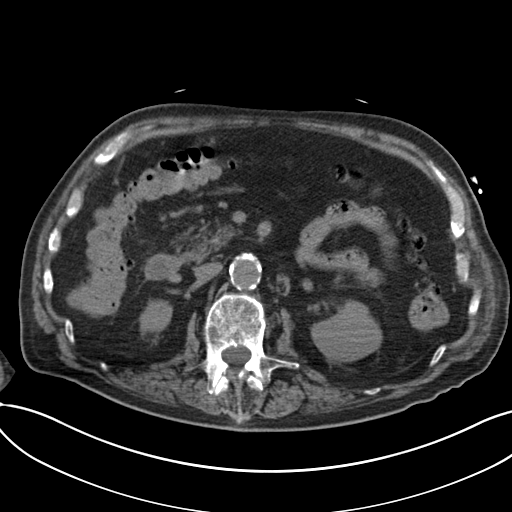
[im 13/43  soft-tissue]
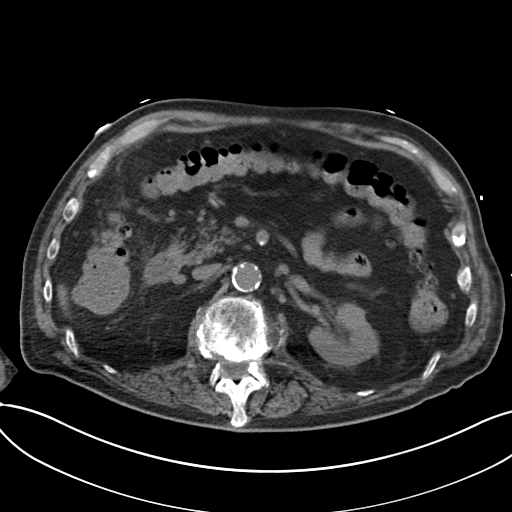
[im 15/43  soft-tissue]
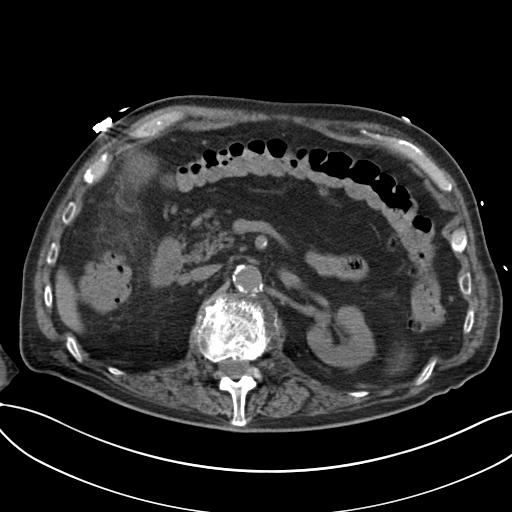
[im 18/43  soft-tissue]
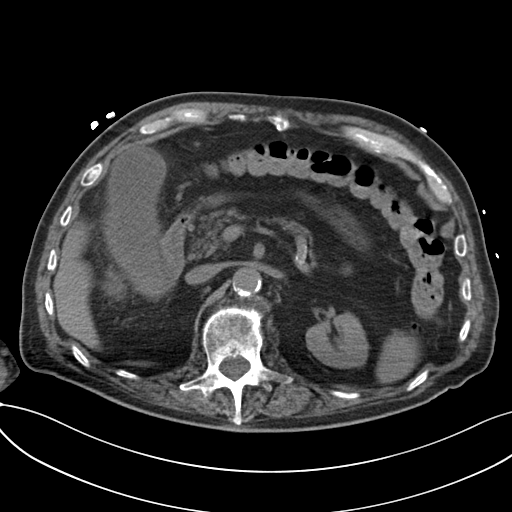
[im 23/43  soft-tissue]
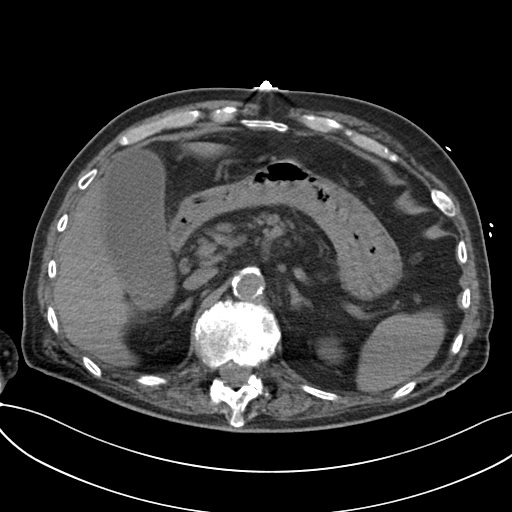
[im 25/43  soft-tissue]
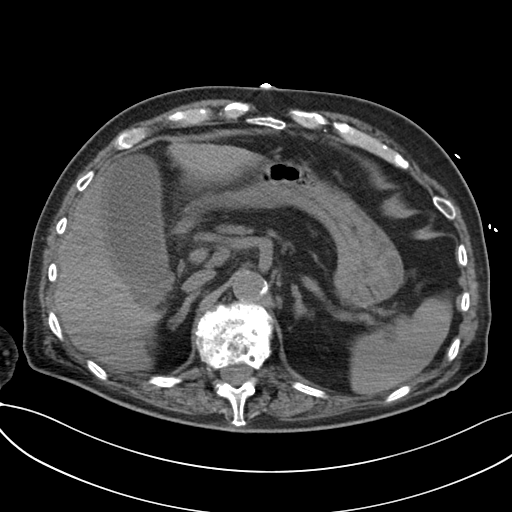
[im 28/43  soft-tissue]
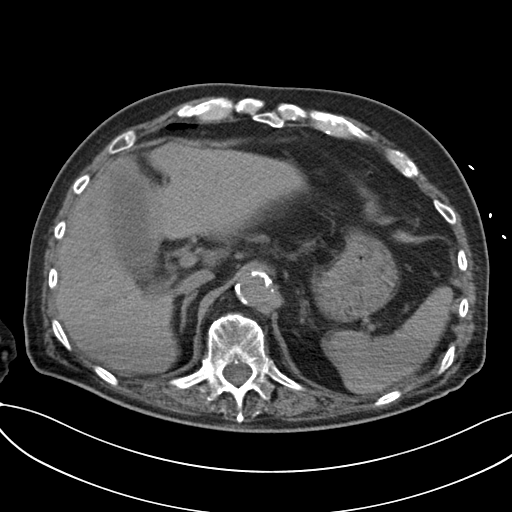
[im 28/43  bone]
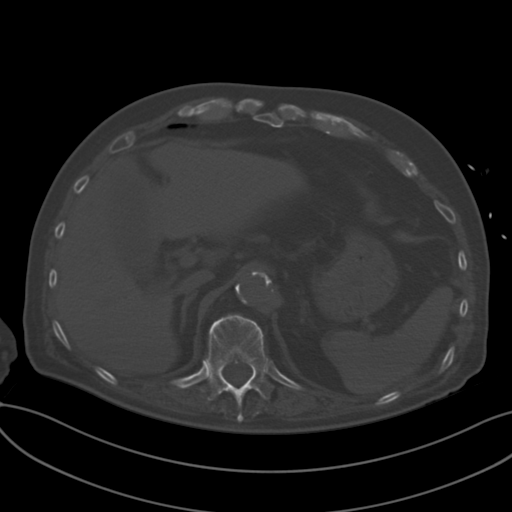
[im 30/43  soft-tissue]
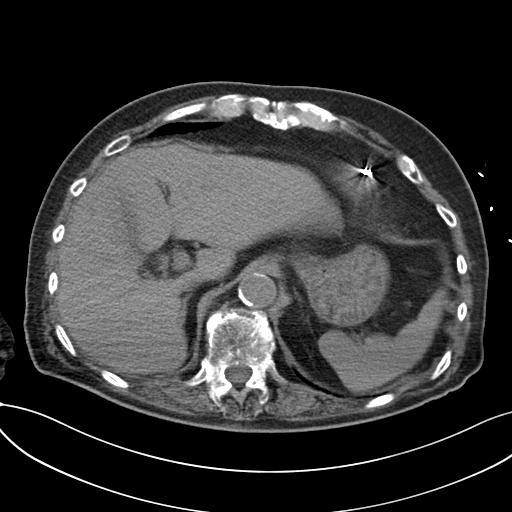
[im 33/43  soft-tissue]
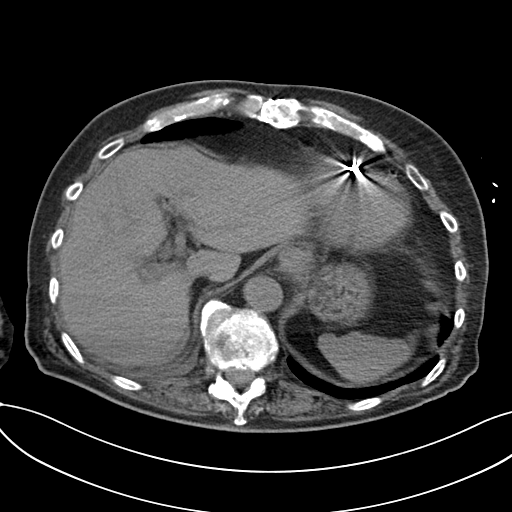
[im 33/43  lung]
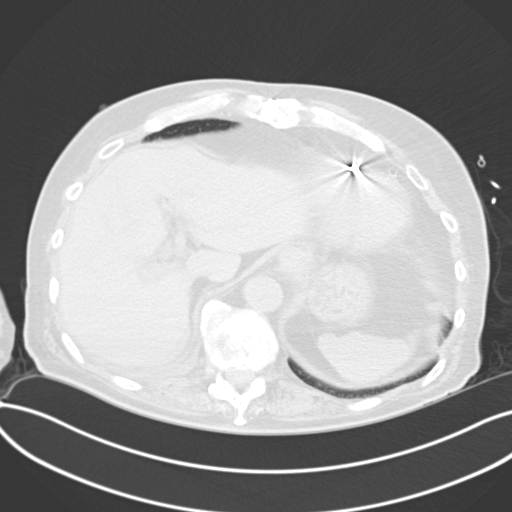
[im 35/43  lung]
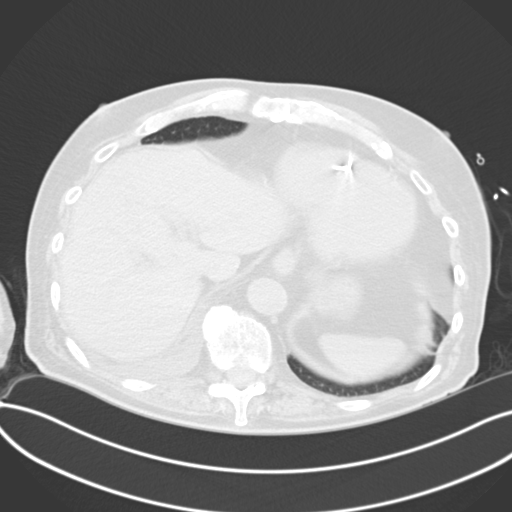
[im 38/43  soft-tissue]
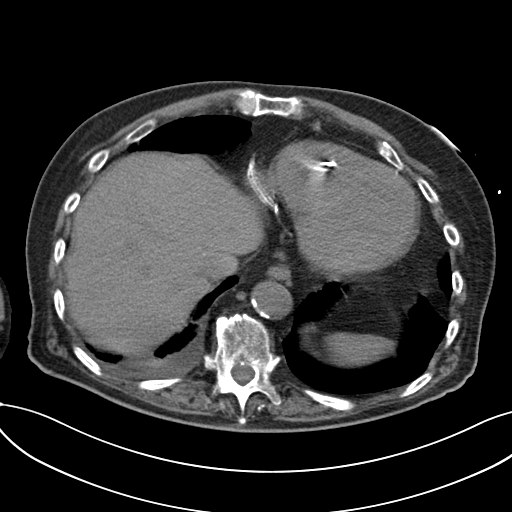
[im 38/43  lung]
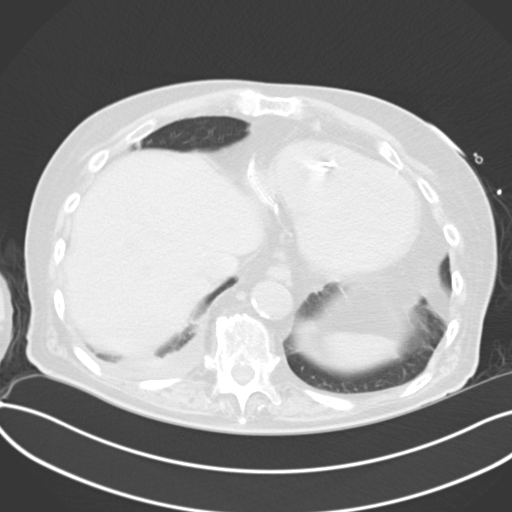
[im 40/43  soft-tissue]
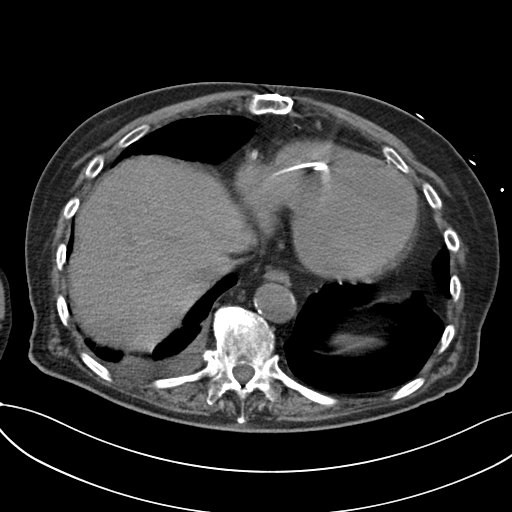
[im 40/43  lung]
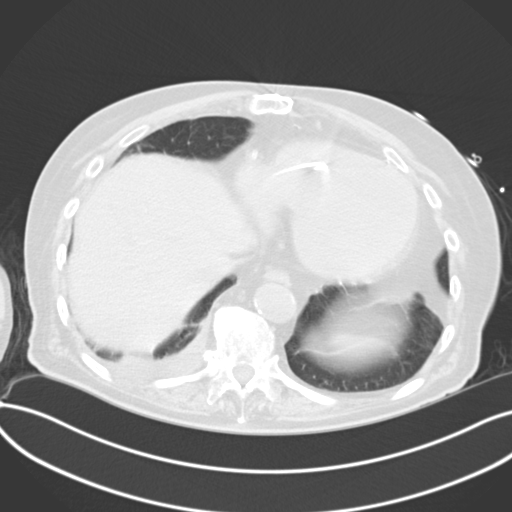

[14 of 32 positions shown; findings below may reference images not displayed]

EXAM:
CT PERCUTANEOUS TRANSHEPATIC CHOLECYSTOSTOMY

MEDICATIONS:
The patient is currently admitted to the hospital and receiving
intravenous antibiotics. The antibiotics were administered within an
appropriate time frame prior to the initiation of the procedure.

ANESTHESIA/SEDATION:
0.5 mg IV Versed 25 mcg IV Fentanyl

Moderate Sedation Time:  12 MINUTES

The patient was continuously monitored during the procedure by the
interventional radiology nurse under my direct supervision.

COMPLICATIONS:
None immediate.
PROCEDURE:
Previous imaging reviewed. Patient positioned supine. Noncontrast
localization CT performed. The distended gallbladder containing
gallstones was localized and marked for a right upper quadrant
transhepatic approach.

Under sterile conditions and local anesthesia, an 18 gauge
introducer needle was advanced from an inferior transhepatic
approach into the gallbladder. Needle position confirmed with CT.
Syringe aspiration yielded exudative bile. Sample sent for culture.
Guidewire inserted followed by tract dilatation to insert a 10
French drain. Drain catheter position confirmed with CT. Syringe
aspiration yielded 120 cc of bile. This completely collapsed the
gallbladder. Catheter secured with Prolene suture and connected to
external gravity drainage bag. Sterile dressing applied. No
immediate complication.
FINDINGS: Imaging confirms needle placement into the gallbladder for
cholecystostomy insertion
IMPRESSION: Successful CT-guided transhepatic cholecystostomy
